# Patient Record
Sex: Male | Born: 1940
Health system: Southern US, Community
[De-identification: ages and names within clinical notes are randomized; demographics above are authoritative.]

## PROBLEM LIST (undated history)

## (undated) DIAGNOSIS — R609 Edema, unspecified: Secondary | ICD-10-CM

## (undated) DIAGNOSIS — M2550 Pain in unspecified joint: Secondary | ICD-10-CM

## (undated) DIAGNOSIS — R51 Headache: Secondary | ICD-10-CM

## (undated) DIAGNOSIS — M2540 Effusion, unspecified joint: Secondary | ICD-10-CM

## (undated) DIAGNOSIS — R600 Localized edema: Secondary | ICD-10-CM

## (undated) DIAGNOSIS — B029 Zoster without complications: Secondary | ICD-10-CM

## (undated) DIAGNOSIS — F32A Depression, unspecified: Secondary | ICD-10-CM

## (undated) DIAGNOSIS — N184 Chronic kidney disease, stage 4 (severe): Secondary | ICD-10-CM

## (undated) DIAGNOSIS — K5732 Diverticulitis of large intestine without perforation or abscess without bleeding: Secondary | ICD-10-CM

## (undated) DIAGNOSIS — Z860100 Personal history of colon polyps, unspecified: Secondary | ICD-10-CM

## (undated) DIAGNOSIS — N401 Enlarged prostate with lower urinary tract symptoms: Secondary | ICD-10-CM

## (undated) DIAGNOSIS — N3941 Urge incontinence: Secondary | ICD-10-CM

## (undated) DIAGNOSIS — G4730 Sleep apnea, unspecified: Secondary | ICD-10-CM

## (undated) DIAGNOSIS — I5032 Chronic diastolic (congestive) heart failure: Secondary | ICD-10-CM

## (undated) DIAGNOSIS — I10 Essential (primary) hypertension: Secondary | ICD-10-CM

## (undated) DIAGNOSIS — S72009A Fracture of unspecified part of neck of unspecified femur, initial encounter for closed fracture: Secondary | ICD-10-CM

## (undated) DIAGNOSIS — N189 Chronic kidney disease, unspecified: Secondary | ICD-10-CM

## (undated) DIAGNOSIS — M1990 Unspecified osteoarthritis, unspecified site: Secondary | ICD-10-CM

## (undated) DIAGNOSIS — M810 Age-related osteoporosis without current pathological fracture: Secondary | ICD-10-CM

## (undated) DIAGNOSIS — M549 Dorsalgia, unspecified: Secondary | ICD-10-CM

## (undated) DIAGNOSIS — K645 Perianal venous thrombosis: Secondary | ICD-10-CM

## (undated) DIAGNOSIS — E039 Hypothyroidism, unspecified: Secondary | ICD-10-CM

## (undated) DIAGNOSIS — F319 Bipolar disorder, unspecified: Secondary | ICD-10-CM

## (undated) DIAGNOSIS — F329 Major depressive disorder, single episode, unspecified: Secondary | ICD-10-CM

## (undated) HISTORY — DX: Zoster without complications: B02.9

## (undated) HISTORY — DX: Diverticulitis of large intestine without perforation or abscess without bleeding: K57.32

## (undated) HISTORY — DX: Perianal venous thrombosis: K64.5

## (undated) HISTORY — DX: Benign prostatic hyperplasia with lower urinary tract symptoms: N39.41

## (undated) HISTORY — PX: BACK SURGERY: SHX140

## (undated) HISTORY — PX: TONSILLECTOMY: SUR1361

## (undated) HISTORY — DX: Fracture of unspecified part of neck of unspecified femur, initial encounter for closed fracture: S72.009A

## (undated) HISTORY — PX: CARDIAC CATHETERIZATION: SHX172

## (undated) HISTORY — DX: Bipolar disorder, unspecified: F31.9

## (undated) HISTORY — DX: Chronic diastolic (congestive) heart failure: I50.32

## (undated) HISTORY — DX: Hypothyroidism, unspecified: E03.9

## (undated) HISTORY — DX: Benign prostatic hyperplasia with lower urinary tract symptoms: N40.1

## (undated) HISTORY — DX: Chronic kidney disease, stage 4 (severe): N18.4

## (undated) HISTORY — DX: Unspecified osteoarthritis, unspecified site: M19.90

## (undated) HISTORY — DX: Essential (primary) hypertension: I10

---

## 2000-11-15 ENCOUNTER — Other Ambulatory Visit (HOSPITAL_COMMUNITY): Admission: RE | Admit: 2000-11-15 | Discharge: 2000-11-22 | Payer: Self-pay | Admitting: Psychiatry

## 2001-03-27 ENCOUNTER — Inpatient Hospital Stay (HOSPITAL_COMMUNITY): Admission: EM | Admit: 2001-03-27 | Discharge: 2001-03-30 | Payer: Self-pay | Admitting: Psychiatry

## 2004-10-14 ENCOUNTER — Ambulatory Visit: Payer: Self-pay | Admitting: Internal Medicine

## 2004-11-04 ENCOUNTER — Ambulatory Visit: Payer: Self-pay | Admitting: Internal Medicine

## 2004-11-11 ENCOUNTER — Ambulatory Visit: Payer: Self-pay | Admitting: Internal Medicine

## 2005-05-13 ENCOUNTER — Ambulatory Visit: Payer: Self-pay | Admitting: Internal Medicine

## 2006-03-02 ENCOUNTER — Ambulatory Visit: Payer: Self-pay | Admitting: Internal Medicine

## 2006-03-23 ENCOUNTER — Ambulatory Visit: Payer: Self-pay | Admitting: Internal Medicine

## 2006-03-23 LAB — CONVERTED CEMR LAB
ALT: 16 units/L (ref 0–40)
AST: 20 units/L (ref 0–37)
Albumin: 4 g/dL (ref 3.5–5.2)
Alkaline Phosphatase: 97 units/L (ref 39–117)
BUN: 21 mg/dL (ref 6–23)
Basophils Absolute: 0 10*3/uL (ref 0.0–0.1)
Basophils Relative: 0.1 % (ref 0.0–1.0)
CO2: 29 meq/L (ref 19–32)
Calcium: 9.7 mg/dL (ref 8.4–10.5)
Chloride: 111 meq/L (ref 96–112)
Chol/HDL Ratio, serum: 2.7
Cholesterol: 135 mg/dL (ref 0–200)
Creatinine, Ser: 2.1 mg/dL — ABNORMAL HIGH (ref 0.4–1.5)
Eosinophil percent: 5.2 % — ABNORMAL HIGH (ref 0.0–5.0)
GFR calc non Af Amer: 34 mL/min
Glomerular Filtration Rate, Af Am: 41 mL/min/{1.73_m2}
Glucose, Bld: 102 mg/dL — ABNORMAL HIGH (ref 70–99)
HCT: 39.5 % (ref 39.0–52.0)
HDL: 50.6 mg/dL (ref >39.0)
Hemoglobin: 12.8 g/dL — ABNORMAL LOW (ref 13.0–17.0)
LDL Cholesterol: 73 mg/dL (ref 0–99)
Lymphocytes Relative: 17.9 % (ref 12.0–46.0)
MCHC: 32.4 g/dL (ref 30.0–36.0)
MCV: 91.6 fL (ref 78.0–100.0)
Monocytes Absolute: 0.7 10*3/uL (ref 0.2–0.7)
Monocytes Relative: 6.9 % (ref 3.0–11.0)
Neutro Abs: 6.7 10*3/uL (ref 1.4–7.7)
Neutrophils Relative %: 69.9 % (ref 43.0–77.0)
PSA: 0.94 ng/mL (ref 0.10–4.00)
Platelets: 268 10*3/uL (ref 150–400)
Potassium: 4.3 meq/L (ref 3.5–5.1)
RBC: 4.32 M/uL (ref 4.22–5.81)
RDW: 11.9 % (ref 11.5–14.6)
Sodium: 145 meq/L (ref 135–145)
TSH: 5.1 microintl units/mL (ref 0.35–5.50)
Total Bilirubin: 0.7 mg/dL (ref 0.3–1.2)
Total Protein: 6.7 g/dL (ref 6.0–8.3)
Triglyceride fasting, serum: 58 mg/dL (ref 0–149)
VLDL: 12 mg/dL (ref 0–40)
WBC: 9.6 10*3/uL (ref 4.5–10.5)

## 2006-04-14 ENCOUNTER — Ambulatory Visit: Payer: Self-pay | Admitting: Internal Medicine

## 2006-08-29 ENCOUNTER — Ambulatory Visit: Payer: Self-pay | Admitting: Internal Medicine

## 2006-08-29 LAB — CONVERTED CEMR LAB
Free T4: 0.8 ng/dL (ref 0.6–1.6)
T3 Uptake Ratio: 43.5 % — ABNORMAL HIGH (ref 22.5–37.0)
T4, Total: 7.8 ug/dL (ref 5.0–12.5)
TSH: 3.97 microintl units/mL (ref 0.35–5.50)

## 2006-09-21 ENCOUNTER — Ambulatory Visit: Payer: Self-pay | Admitting: Internal Medicine

## 2006-09-21 DIAGNOSIS — K589 Irritable bowel syndrome without diarrhea: Secondary | ICD-10-CM | POA: Insufficient documentation

## 2006-09-21 DIAGNOSIS — K5730 Diverticulosis of large intestine without perforation or abscess without bleeding: Secondary | ICD-10-CM | POA: Insufficient documentation

## 2006-09-21 DIAGNOSIS — F3131 Bipolar disorder, current episode depressed, mild: Secondary | ICD-10-CM | POA: Insufficient documentation

## 2007-01-06 ENCOUNTER — Ambulatory Visit: Payer: Self-pay | Admitting: Internal Medicine

## 2007-01-06 LAB — CONVERTED CEMR LAB
Basophils Absolute: 0.1 10*3/uL (ref 0.0–0.1)
Basophils Relative: 1.1 % — ABNORMAL HIGH (ref 0.0–1.0)
Eosinophils Absolute: 0.4 10*3/uL (ref 0.0–0.6)
Eosinophils Relative: 5 % (ref 0.0–5.0)
Free T4: 0.8 ng/dL (ref 0.6–1.6)
HCT: 35.9 % — ABNORMAL LOW (ref 39.0–52.0)
Hemoglobin: 12.4 g/dL — ABNORMAL LOW (ref 13.0–17.0)
Lymphocytes Relative: 18.4 % (ref 12.0–46.0)
MCHC: 34.5 g/dL (ref 30.0–36.0)
MCV: 93.3 fL (ref 78.0–100.0)
Monocytes Absolute: 0.8 10*3/uL — ABNORMAL HIGH (ref 0.2–0.7)
Monocytes Relative: 8.7 % (ref 3.0–11.0)
Neutro Abs: 5.8 10*3/uL (ref 1.4–7.7)
Neutrophils Relative %: 66.8 % (ref 43.0–77.0)
Platelets: 229 10*3/uL (ref 150–400)
RBC: 3.85 M/uL — ABNORMAL LOW (ref 4.22–5.81)
RDW: 12.2 % (ref 11.5–14.6)
T3 Uptake Ratio: 41.7 % — ABNORMAL HIGH (ref 22.5–37.0)
TSH: 6.09 microintl units/mL — ABNORMAL HIGH (ref 0.35–5.50)
WBC: 8.7 10*3/uL (ref 4.5–10.5)

## 2007-02-13 ENCOUNTER — Telehealth: Payer: Self-pay | Admitting: Internal Medicine

## 2007-04-11 ENCOUNTER — Ambulatory Visit: Payer: Self-pay | Admitting: Internal Medicine

## 2007-04-11 DIAGNOSIS — D500 Iron deficiency anemia secondary to blood loss (chronic): Secondary | ICD-10-CM | POA: Insufficient documentation

## 2007-04-11 DIAGNOSIS — I10 Essential (primary) hypertension: Secondary | ICD-10-CM

## 2007-04-11 HISTORY — DX: Essential (primary) hypertension: I10

## 2007-04-11 LAB — CONVERTED CEMR LAB
BUN: 19 mg/dL (ref 6–23)
Basophils Absolute: 0 10*3/uL (ref 0.0–0.1)
Basophils Relative: 0.3 % (ref 0.0–1.0)
CO2: 29 meq/L (ref 19–32)
Calcium: 9.5 mg/dL (ref 8.4–10.5)
Chloride: 106 meq/L (ref 96–112)
Creatinine, Ser: 2.1 mg/dL — ABNORMAL HIGH (ref 0.4–1.5)
Eosinophils Absolute: 0.4 10*3/uL (ref 0.0–0.6)
Eosinophils Relative: 3.7 % (ref 0.0–5.0)
GFR calc Af Amer: 41 mL/min
GFR calc non Af Amer: 34 mL/min
Glucose, Bld: 117 mg/dL — ABNORMAL HIGH (ref 70–99)
HCT: 36.6 % — ABNORMAL LOW (ref 39.0–52.0)
Hemoglobin: 13 g/dL (ref 13.0–17.0)
Lymphocytes Relative: 21 % (ref 12.0–46.0)
MCHC: 35.5 g/dL (ref 30.0–36.0)
MCV: 92.8 fL (ref 78.0–100.0)
Monocytes Absolute: 0.8 10*3/uL — ABNORMAL HIGH (ref 0.2–0.7)
Monocytes Relative: 8.2 % (ref 3.0–11.0)
Neutro Abs: 6.6 10*3/uL (ref 1.4–7.7)
Neutrophils Relative %: 66.8 % (ref 43.0–77.0)
Platelets: 268 10*3/uL (ref 150–400)
Potassium: 4.7 meq/L (ref 3.5–5.1)
RBC: 3.94 M/uL — ABNORMAL LOW (ref 4.22–5.81)
RDW: 12.5 % (ref 11.5–14.6)
Sodium: 141 meq/L (ref 135–145)
TSH: 4.09 microintl units/mL (ref 0.35–5.50)
WBC: 9.9 10*3/uL (ref 4.5–10.5)

## 2007-04-12 ENCOUNTER — Telehealth: Payer: Self-pay | Admitting: Internal Medicine

## 2007-05-15 ENCOUNTER — Ambulatory Visit: Payer: Self-pay | Admitting: Internal Medicine

## 2007-05-15 LAB — CONVERTED CEMR LAB
BUN: 26 mg/dL — ABNORMAL HIGH (ref 6–23)
Basophils Absolute: 0.1 10*3/uL (ref 0.0–0.1)
Basophils Relative: 1.3 % — ABNORMAL HIGH (ref 0.0–1.0)
CO2: 26 meq/L (ref 19–32)
Calcium: 9.7 mg/dL (ref 8.4–10.5)
Chloride: 108 meq/L (ref 96–112)
Creatinine, Ser: 2.3 mg/dL — ABNORMAL HIGH (ref 0.4–1.5)
Eosinophils Absolute: 0.5 10*3/uL (ref 0.0–0.6)
Eosinophils Relative: 4.7 % (ref 0.0–5.0)
GFR calc Af Amer: 37 mL/min
GFR calc non Af Amer: 30 mL/min
Glucose, Bld: 98 mg/dL (ref 70–99)
HCT: 39.3 % (ref 39.0–52.0)
Hemoglobin: 12.9 g/dL — ABNORMAL LOW (ref 13.0–17.0)
Lithium Lvl: 1.93 meq/L — ABNORMAL HIGH (ref 0.80–1.40)
Lymphocytes Relative: 14.9 % (ref 12.0–46.0)
MCHC: 32.8 g/dL (ref 30.0–36.0)
MCV: 94.8 fL (ref 78.0–100.0)
Monocytes Absolute: 0.8 10*3/uL — ABNORMAL HIGH (ref 0.2–0.7)
Monocytes Relative: 7.9 % (ref 3.0–11.0)
Neutro Abs: 7.4 10*3/uL (ref 1.4–7.7)
Neutrophils Relative %: 71.2 % (ref 43.0–77.0)
Platelets: 251 10*3/uL (ref 150–400)
Potassium: 5.9 meq/L — ABNORMAL HIGH (ref 3.5–5.1)
RBC: 4.14 M/uL — ABNORMAL LOW (ref 4.22–5.81)
RDW: 12.3 % (ref 11.5–14.6)
Sodium: 138 meq/L (ref 135–145)
WBC: 10.3 10*3/uL (ref 4.5–10.5)

## 2007-05-19 ENCOUNTER — Ambulatory Visit: Payer: Self-pay | Admitting: Internal Medicine

## 2007-05-23 LAB — CONVERTED CEMR LAB: Lithium Lvl: 1.71 meq/L — ABNORMAL HIGH (ref 0.80–1.40)

## 2007-06-01 ENCOUNTER — Telehealth: Payer: Self-pay | Admitting: Internal Medicine

## 2007-06-07 ENCOUNTER — Encounter: Payer: Self-pay | Admitting: Internal Medicine

## 2007-06-07 ENCOUNTER — Telehealth: Payer: Self-pay | Admitting: Internal Medicine

## 2007-06-07 LAB — CONVERTED CEMR LAB: Lithium Lvl: <0.25 meq/L — ABNORMAL LOW (ref 0.80–1.40)

## 2007-06-08 ENCOUNTER — Ambulatory Visit: Payer: Self-pay | Admitting: Internal Medicine

## 2007-06-08 LAB — CONVERTED CEMR LAB
BUN: 29 mg/dL — ABNORMAL HIGH (ref 6–23)
CO2: 25 meq/L (ref 19–32)
Calcium: 8.8 mg/dL (ref 8.4–10.5)
Chloride: 108 meq/L (ref 96–112)
Creatinine, Ser: 2.5 mg/dL — ABNORMAL HIGH (ref 0.4–1.5)
GFR calc Af Amer: 33 mL/min
GFR calc non Af Amer: 28 mL/min
Glucose, Bld: 86 mg/dL (ref 70–99)
Potassium: 4.7 meq/L (ref 3.5–5.1)
Sodium: 137 meq/L (ref 135–145)

## 2007-06-09 ENCOUNTER — Ambulatory Visit: Payer: Self-pay | Admitting: Internal Medicine

## 2007-06-15 ENCOUNTER — Ambulatory Visit: Payer: Self-pay | Admitting: Internal Medicine

## 2007-06-15 LAB — CONVERTED CEMR LAB
BUN: 29 mg/dL — ABNORMAL HIGH (ref 6–23)
CO2: 28 meq/L (ref 19–32)
Calcium: 8.8 mg/dL (ref 8.4–10.5)
Chloride: 105 meq/L (ref 96–112)
Creatinine, Ser: 2.3 mg/dL — ABNORMAL HIGH (ref 0.4–1.5)
GFR calc Af Amer: 37 mL/min
GFR calc non Af Amer: 30 mL/min
Glucose, Bld: 87 mg/dL (ref 70–99)
Potassium: 5.1 meq/L (ref 3.5–5.1)
Sodium: 138 meq/L (ref 135–145)

## 2007-06-16 ENCOUNTER — Ambulatory Visit: Payer: Self-pay | Admitting: Internal Medicine

## 2007-06-16 LAB — CONVERTED CEMR LAB
BUN: 28 mg/dL — ABNORMAL HIGH (ref 6–23)
CO2: 23 meq/L (ref 19–32)
Calcium: 8.8 mg/dL (ref 8.4–10.5)
Chloride: 108 meq/L (ref 96–112)
Creatinine, Ser: 2.14 mg/dL — ABNORMAL HIGH (ref 0.40–1.50)
Glucose, Bld: 89 mg/dL (ref 70–99)
Potassium: 5.3 meq/L (ref 3.5–5.3)
Sodium: 144 meq/L (ref 135–145)

## 2007-06-23 ENCOUNTER — Telehealth: Payer: Self-pay | Admitting: Internal Medicine

## 2007-07-17 ENCOUNTER — Ambulatory Visit: Payer: Self-pay | Admitting: Internal Medicine

## 2007-07-17 LAB — CONVERTED CEMR LAB
BUN: 29 mg/dL — ABNORMAL HIGH (ref 6–23)
CO2: 31 meq/L (ref 19–32)
Calcium: 9.2 mg/dL (ref 8.4–10.5)
Chloride: 109 meq/L (ref 96–112)
Creatinine, Ser: 2 mg/dL — ABNORMAL HIGH (ref 0.4–1.5)
GFR calc Af Amer: 43 mL/min
GFR calc non Af Amer: 36 mL/min
Glucose, Bld: 75 mg/dL (ref 70–99)
Potassium: 5.5 meq/L — ABNORMAL HIGH (ref 3.5–5.1)
Sodium: 142 meq/L (ref 135–145)

## 2007-09-18 ENCOUNTER — Ambulatory Visit: Payer: Self-pay | Admitting: Internal Medicine

## 2007-09-18 LAB — CONVERTED CEMR LAB
BUN: 24 mg/dL — ABNORMAL HIGH (ref 6–23)
CO2: 29 meq/L (ref 19–32)
Calcium: 9.2 mg/dL (ref 8.4–10.5)
Chloride: 111 meq/L (ref 96–112)
Creatinine, Ser: 2.2 mg/dL — ABNORMAL HIGH (ref 0.4–1.5)
GFR calc Af Amer: 39 mL/min
GFR calc non Af Amer: 32 mL/min
Glucose, Bld: 62 mg/dL — ABNORMAL LOW (ref 70–99)
Potassium: 4.5 meq/L (ref 3.5–5.1)
Sodium: 144 meq/L (ref 135–145)

## 2007-12-18 ENCOUNTER — Ambulatory Visit: Payer: Self-pay | Admitting: Internal Medicine

## 2008-01-01 LAB — CONVERTED CEMR LAB
BUN: 43 mg/dL — ABNORMAL HIGH (ref 6–23)
Basophils Absolute: 0.1 10*3/uL (ref 0.0–0.1)
Basophils Relative: 1.2 % (ref 0.0–3.0)
CO2: 30 meq/L (ref 19–32)
Calcium: 9.1 mg/dL (ref 8.4–10.5)
Chloride: 107 meq/L (ref 96–112)
Creatinine, Ser: 2.4 mg/dL — ABNORMAL HIGH (ref 0.4–1.5)
Eosinophils Absolute: 0.3 10*3/uL (ref 0.0–0.7)
Eosinophils Relative: 4.3 % (ref 0.0–5.0)
GFR calc Af Amer: 35 mL/min
GFR calc non Af Amer: 29 mL/min
Glucose, Bld: 145 mg/dL — ABNORMAL HIGH (ref 70–99)
HCT: 35.4 % — ABNORMAL LOW (ref 39.0–52.0)
Hemoglobin: 12.6 g/dL — ABNORMAL LOW (ref 13.0–17.0)
Lymphocytes Relative: 25.8 % (ref 12.0–46.0)
MCHC: 35.4 g/dL (ref 30.0–36.0)
MCV: 91.6 fL (ref 78.0–100.0)
Monocytes Absolute: 0.7 10*3/uL (ref 0.1–1.0)
Monocytes Relative: 10.2 % (ref 3.0–12.0)
Neutro Abs: 4.1 10*3/uL (ref 1.4–7.7)
Neutrophils Relative %: 58.5 % (ref 43.0–77.0)
Platelets: 214 10*3/uL (ref 150–400)
Potassium: 4.2 meq/L (ref 3.5–5.1)
RBC: 3.87 M/uL — ABNORMAL LOW (ref 4.22–5.81)
RDW: 12 % (ref 11.5–14.6)
Sodium: 146 meq/L — ABNORMAL HIGH (ref 135–145)
WBC: 7 10*3/uL (ref 4.5–10.5)

## 2008-01-10 ENCOUNTER — Encounter: Payer: Self-pay | Admitting: Internal Medicine

## 2008-01-15 ENCOUNTER — Ambulatory Visit: Payer: Self-pay | Admitting: Internal Medicine

## 2008-01-15 LAB — CONVERTED CEMR LAB
BUN: 27 mg/dL — ABNORMAL HIGH (ref 6–23)
CO2: 29 meq/L (ref 19–32)
Calcium: 9 mg/dL (ref 8.4–10.5)
Chloride: 112 meq/L (ref 96–112)
Creatinine, Ser: 2 mg/dL — ABNORMAL HIGH (ref 0.4–1.5)
GFR calc Af Amer: 43 mL/min
GFR calc non Af Amer: 36 mL/min
Glucose, Bld: 78 mg/dL (ref 70–99)
Potassium: 5.1 meq/L (ref 3.5–5.1)
Sodium: 145 meq/L (ref 135–145)
TSH: 1.21 microintl units/mL (ref 0.35–5.50)

## 2008-03-01 ENCOUNTER — Ambulatory Visit: Payer: Self-pay | Admitting: Internal Medicine

## 2008-03-01 DIAGNOSIS — N184 Chronic kidney disease, stage 4 (severe): Secondary | ICD-10-CM | POA: Insufficient documentation

## 2008-03-01 LAB — CONVERTED CEMR LAB
BUN: 37 mg/dL — ABNORMAL HIGH (ref 6–23)
CO2: 27 meq/L (ref 19–32)
Calcium: 9.1 mg/dL (ref 8.4–10.5)
Chloride: 108 meq/L (ref 96–112)
Creatinine, Ser: 2.1 mg/dL — ABNORMAL HIGH (ref 0.4–1.5)
GFR calc Af Amer: 41 mL/min
GFR calc non Af Amer: 34 mL/min
Glucose, Bld: 90 mg/dL (ref 70–99)
Potassium: 4.9 meq/L (ref 3.5–5.1)
Sodium: 141 meq/L (ref 135–145)

## 2008-04-29 ENCOUNTER — Telehealth: Payer: Self-pay | Admitting: *Deleted

## 2008-07-05 ENCOUNTER — Ambulatory Visit: Payer: Self-pay | Admitting: Internal Medicine

## 2008-07-05 DIAGNOSIS — K645 Perianal venous thrombosis: Secondary | ICD-10-CM | POA: Insufficient documentation

## 2008-07-05 HISTORY — DX: Perianal venous thrombosis: K64.5

## 2008-07-05 LAB — CONVERTED CEMR LAB
ALT: 8 units/L (ref 0–53)
AST: 14 units/L (ref 0–37)
Albumin: 3.8 g/dL (ref 3.5–5.2)
Alkaline Phosphatase: 84 units/L (ref 39–117)
BUN: 34 mg/dL — ABNORMAL HIGH (ref 6–23)
Bilirubin, Direct: 0.1 mg/dL (ref 0.0–0.3)
CO2: 22 meq/L (ref 19–32)
Calcium: 8.9 mg/dL (ref 8.4–10.5)
Chloride: 109 meq/L (ref 96–112)
Creatinine, Ser: 2.22 mg/dL — ABNORMAL HIGH (ref 0.40–1.50)
Glucose, Bld: 95 mg/dL (ref 70–99)
Indirect Bilirubin: 0.3 mg/dL (ref 0.0–0.9)
Potassium: 5.3 meq/L (ref 3.5–5.3)
Sodium: 142 meq/L (ref 135–145)
Total Bilirubin: 0.4 mg/dL (ref 0.3–1.2)
Total Protein: 6.2 g/dL (ref 6.0–8.3)

## 2008-07-07 ENCOUNTER — Telehealth: Payer: Self-pay | Admitting: Family Medicine

## 2008-07-08 ENCOUNTER — Telehealth: Payer: Self-pay | Admitting: Internal Medicine

## 2008-07-08 DIAGNOSIS — R109 Unspecified abdominal pain: Secondary | ICD-10-CM | POA: Insufficient documentation

## 2008-07-10 ENCOUNTER — Encounter: Payer: Self-pay | Admitting: Internal Medicine

## 2008-07-10 ENCOUNTER — Encounter: Admission: RE | Admit: 2008-07-10 | Discharge: 2008-07-10 | Payer: Self-pay | Admitting: Sports Medicine

## 2008-08-01 ENCOUNTER — Encounter: Payer: Self-pay | Admitting: Internal Medicine

## 2008-08-27 ENCOUNTER — Encounter: Payer: Self-pay | Admitting: Internal Medicine

## 2008-09-24 ENCOUNTER — Encounter: Admission: RE | Admit: 2008-09-24 | Discharge: 2008-09-24 | Payer: Self-pay | Admitting: Sports Medicine

## 2008-09-24 ENCOUNTER — Encounter: Payer: Self-pay | Admitting: Internal Medicine

## 2008-09-25 ENCOUNTER — Ambulatory Visit: Payer: Self-pay | Admitting: Internal Medicine

## 2008-09-25 DIAGNOSIS — M810 Age-related osteoporosis without current pathological fracture: Secondary | ICD-10-CM | POA: Insufficient documentation

## 2008-10-30 ENCOUNTER — Ambulatory Visit: Payer: Self-pay | Admitting: Internal Medicine

## 2008-10-30 DIAGNOSIS — G4733 Obstructive sleep apnea (adult) (pediatric): Secondary | ICD-10-CM | POA: Insufficient documentation

## 2008-10-30 DIAGNOSIS — I87329 Chronic venous hypertension (idiopathic) with inflammation of unspecified lower extremity: Secondary | ICD-10-CM | POA: Insufficient documentation

## 2008-11-29 ENCOUNTER — Encounter: Payer: Self-pay | Admitting: Internal Medicine

## 2008-11-29 ENCOUNTER — Ambulatory Visit (HOSPITAL_BASED_OUTPATIENT_CLINIC_OR_DEPARTMENT_OTHER): Admission: RE | Admit: 2008-11-29 | Discharge: 2008-11-29 | Payer: Self-pay | Admitting: Internal Medicine

## 2008-12-15 ENCOUNTER — Ambulatory Visit: Payer: Self-pay | Admitting: Pulmonary Disease

## 2008-12-18 ENCOUNTER — Ambulatory Visit: Payer: Self-pay | Admitting: Internal Medicine

## 2008-12-29 ENCOUNTER — Emergency Department (HOSPITAL_COMMUNITY): Admission: EM | Admit: 2008-12-29 | Discharge: 2008-12-29 | Payer: Self-pay | Admitting: Emergency Medicine

## 2009-01-02 ENCOUNTER — Ambulatory Visit: Payer: Self-pay | Admitting: Family Medicine

## 2009-01-08 ENCOUNTER — Telehealth: Payer: Self-pay | Admitting: *Deleted

## 2009-01-09 ENCOUNTER — Encounter: Payer: Self-pay | Admitting: Internal Medicine

## 2009-01-22 ENCOUNTER — Encounter: Payer: Self-pay | Admitting: Internal Medicine

## 2009-02-03 ENCOUNTER — Encounter: Payer: Self-pay | Admitting: Internal Medicine

## 2009-02-11 ENCOUNTER — Ambulatory Visit: Payer: Self-pay | Admitting: Internal Medicine

## 2009-02-18 ENCOUNTER — Encounter (INDEPENDENT_AMBULATORY_CARE_PROVIDER_SITE_OTHER): Payer: Self-pay | Admitting: *Deleted

## 2009-02-19 LAB — CONVERTED CEMR LAB
BUN: 35 mg/dL — ABNORMAL HIGH (ref 6–23)
Basophils Absolute: 0 10*3/uL (ref 0.0–0.1)
Basophils Relative: 0.1 % (ref 0.0–3.0)
CO2: 29 meq/L (ref 19–32)
Calcium: 9.5 mg/dL (ref 8.4–10.5)
Chloride: 107 meq/L (ref 96–112)
Creatinine, Ser: 2.2 mg/dL — ABNORMAL HIGH (ref 0.4–1.5)
Eosinophils Absolute: 0.4 10*3/uL (ref 0.0–0.7)
Eosinophils Relative: 4.2 % (ref 0.0–5.0)
GFR calc non Af Amer: 31.7 mL/min (ref >60)
Glucose, Bld: 81 mg/dL (ref 70–99)
HCT: 36.4 % — ABNORMAL LOW (ref 39.0–52.0)
Hemoglobin: 12.7 g/dL — ABNORMAL LOW (ref 13.0–17.0)
Iron: 53 ug/dL (ref 42–165)
Lymphocytes Relative: 17.4 % (ref 12.0–46.0)
Lymphs Abs: 1.5 10*3/uL (ref 0.7–4.0)
MCHC: 34.9 g/dL (ref 30.0–36.0)
MCV: 94.4 fL (ref 78.0–100.0)
Monocytes Absolute: 0.7 10*3/uL (ref 0.1–1.0)
Monocytes Relative: 8.7 % (ref 3.0–12.0)
Neutro Abs: 5.8 10*3/uL (ref 1.4–7.7)
Neutrophils Relative %: 69.6 % (ref 43.0–77.0)
Platelets: 211 10*3/uL (ref 150.0–400.0)
Potassium: 5.2 meq/L — ABNORMAL HIGH (ref 3.5–5.1)
RBC: 3.86 M/uL — ABNORMAL LOW (ref 4.22–5.81)
RDW: 12 % (ref 11.5–14.6)
Saturation Ratios: 22 % (ref 20.0–50.0)
Sodium: 142 meq/L (ref 135–145)
Transferrin: 172.3 mg/dL — ABNORMAL LOW (ref 212.0–360.0)
WBC: 8.4 10*3/uL (ref 4.5–10.5)

## 2009-03-03 ENCOUNTER — Ambulatory Visit: Payer: Self-pay | Admitting: Internal Medicine

## 2009-03-03 LAB — CONVERTED CEMR LAB
BUN: 39 mg/dL — ABNORMAL HIGH (ref 6–23)
CO2: 29 meq/L (ref 19–32)
Calcium: 8.9 mg/dL (ref 8.4–10.5)
Chloride: 107 meq/L (ref 96–112)
Creatinine, Ser: 2.4 mg/dL — ABNORMAL HIGH (ref 0.4–1.5)
GFR calc non Af Amer: 28.67 mL/min (ref >60)
Glucose, Bld: 99 mg/dL (ref 70–99)
Potassium: 4.6 meq/L (ref 3.5–5.1)
Sodium: 143 meq/L (ref 135–145)

## 2009-06-30 ENCOUNTER — Telehealth: Payer: Self-pay | Admitting: Internal Medicine

## 2009-07-02 ENCOUNTER — Ambulatory Visit: Payer: Self-pay | Admitting: Internal Medicine

## 2009-07-02 LAB — CONVERTED CEMR LAB
BUN: 39 mg/dL — ABNORMAL HIGH (ref 6–23)
CO2: 29 meq/L (ref 19–32)
Calcium: 8.9 mg/dL (ref 8.4–10.5)
Chloride: 109 meq/L (ref 96–112)
Creatinine, Ser: 2 mg/dL — ABNORMAL HIGH (ref 0.4–1.5)
GFR calc non Af Amer: 35.35 mL/min (ref >60)
Glucose, Bld: 68 mg/dL — ABNORMAL LOW (ref 70–99)
Potassium: 4.5 meq/L (ref 3.5–5.1)
Sodium: 145 meq/L (ref 135–145)

## 2009-08-28 ENCOUNTER — Telehealth: Payer: Self-pay | Admitting: Internal Medicine

## 2009-09-14 ENCOUNTER — Encounter: Payer: Self-pay | Admitting: Internal Medicine

## 2009-09-30 ENCOUNTER — Encounter: Payer: Self-pay | Admitting: Internal Medicine

## 2009-10-03 ENCOUNTER — Ambulatory Visit: Payer: Self-pay | Admitting: Internal Medicine

## 2009-10-03 DIAGNOSIS — E039 Hypothyroidism, unspecified: Secondary | ICD-10-CM | POA: Insufficient documentation

## 2009-10-03 HISTORY — DX: Hypothyroidism, unspecified: E03.9

## 2009-10-03 LAB — CONVERTED CEMR LAB
ALT: 14 units/L (ref 0–53)
AST: 17 units/L (ref 0–37)
Albumin: 3.9 g/dL (ref 3.5–5.2)
Alkaline Phosphatase: 65 units/L (ref 39–117)
BUN: 41 mg/dL — ABNORMAL HIGH (ref 6–23)
Basophils Absolute: 0.1 10*3/uL (ref 0.0–0.1)
Basophils Relative: 1 % (ref 0.0–3.0)
Bilirubin Urine: NEGATIVE
Bilirubin, Direct: 0.2 mg/dL (ref 0.0–0.3)
Blood in Urine, dipstick: NEGATIVE
CO2: 29 meq/L (ref 19–32)
Calcium: 9.2 mg/dL (ref 8.4–10.5)
Chloride: 108 meq/L (ref 96–112)
Cholesterol: 138 mg/dL (ref 0–200)
Creatinine, Ser: 2.2 mg/dL — ABNORMAL HIGH (ref 0.4–1.5)
Eosinophils Absolute: 0.3 10*3/uL (ref 0.0–0.7)
Eosinophils Relative: 3.6 % (ref 0.0–5.0)
GFR calc non Af Amer: 31.64 mL/min (ref >60)
Glucose, Bld: 85 mg/dL (ref 70–99)
Glucose, Urine, Semiquant: NEGATIVE
HCT: 37 % — ABNORMAL LOW (ref 39.0–52.0)
HDL: 51.3 mg/dL (ref >39.00)
Hemoglobin: 12.7 g/dL — ABNORMAL LOW (ref 13.0–17.0)
Ketones, urine, test strip: NEGATIVE
LDL Cholesterol: 68 mg/dL (ref 0–99)
Lymphocytes Relative: 21.7 % (ref 12.0–46.0)
Lymphs Abs: 1.6 10*3/uL (ref 0.7–4.0)
MCHC: 34.3 g/dL (ref 30.0–36.0)
MCV: 95.1 fL (ref 78.0–100.0)
Monocytes Absolute: 0.6 10*3/uL (ref 0.1–1.0)
Monocytes Relative: 8.2 % (ref 3.0–12.0)
Neutro Abs: 4.8 10*3/uL (ref 1.4–7.7)
Neutrophils Relative %: 65.5 % (ref 43.0–77.0)
Nitrite: NEGATIVE
PSA: 0.87 ng/mL (ref 0.10–4.00)
Platelets: 201 10*3/uL (ref 150.0–400.0)
Potassium: 5.2 meq/L — ABNORMAL HIGH (ref 3.5–5.1)
Protein, U semiquant: NEGATIVE
RBC: 3.89 M/uL — ABNORMAL LOW (ref 4.22–5.81)
RDW: 13.4 % (ref 11.5–14.6)
Sodium: 141 meq/L (ref 135–145)
Specific Gravity, Urine: 1.015
TSH: 1.46 microintl units/mL (ref 0.35–5.50)
Total Bilirubin: 0.7 mg/dL (ref 0.3–1.2)
Total CHOL/HDL Ratio: 3
Total Protein: 6.4 g/dL (ref 6.0–8.3)
Triglycerides: 93 mg/dL (ref 0.0–149.0)
Urobilinogen, UA: 0.2
VLDL: 18.6 mg/dL (ref 0.0–40.0)
WBC Urine, dipstick: NEGATIVE
WBC: 7.4 10*3/uL (ref 4.5–10.5)
pH: 6

## 2009-10-07 ENCOUNTER — Ambulatory Visit: Payer: Self-pay | Admitting: Internal Medicine

## 2009-10-07 LAB — CONVERTED CEMR LAB

## 2009-10-17 ENCOUNTER — Encounter (INDEPENDENT_AMBULATORY_CARE_PROVIDER_SITE_OTHER): Payer: Self-pay | Admitting: *Deleted

## 2009-12-04 ENCOUNTER — Encounter (INDEPENDENT_AMBULATORY_CARE_PROVIDER_SITE_OTHER): Payer: Self-pay | Admitting: *Deleted

## 2009-12-08 ENCOUNTER — Ambulatory Visit: Payer: Self-pay | Admitting: Gastroenterology

## 2009-12-29 ENCOUNTER — Ambulatory Visit: Payer: Self-pay | Admitting: Gastroenterology

## 2009-12-31 ENCOUNTER — Encounter: Payer: Self-pay | Admitting: Gastroenterology

## 2010-01-17 LAB — HM COLONOSCOPY

## 2010-02-09 ENCOUNTER — Ambulatory Visit: Payer: Self-pay | Admitting: Internal Medicine

## 2010-02-10 LAB — CONVERTED CEMR LAB
BUN: 39 mg/dL — ABNORMAL HIGH (ref 6–23)
Basophils Absolute: 0.1 10*3/uL (ref 0.0–0.1)
Basophils Relative: 1.5 % (ref 0.0–3.0)
CO2: 28 meq/L (ref 19–32)
Calcium: 9.6 mg/dL (ref 8.4–10.5)
Chloride: 105 meq/L (ref 96–112)
Creatinine, Ser: 2.1 mg/dL — ABNORMAL HIGH (ref 0.4–1.5)
Eosinophils Absolute: 0.3 10*3/uL (ref 0.0–0.7)
Eosinophils Relative: 3.3 % (ref 0.0–5.0)
GFR calc non Af Amer: 32.99 mL/min (ref >60)
Glucose, Bld: 103 mg/dL — ABNORMAL HIGH (ref 70–99)
HCT: 41.1 % (ref 39.0–52.0)
Hemoglobin: 13.9 g/dL (ref 13.0–17.0)
Iron: 64 ug/dL (ref 42–165)
Lymphocytes Relative: 18.7 % (ref 12.0–46.0)
Lymphs Abs: 1.5 10*3/uL (ref 0.7–4.0)
MCHC: 33.7 g/dL (ref 30.0–36.0)
MCV: 94.9 fL (ref 78.0–100.0)
Monocytes Absolute: 0.7 10*3/uL (ref 0.1–1.0)
Monocytes Relative: 8.6 % (ref 3.0–12.0)
Neutro Abs: 5.4 10*3/uL (ref 1.4–7.7)
Neutrophils Relative %: 67.9 % (ref 43.0–77.0)
Platelets: 198 10*3/uL (ref 150.0–400.0)
Potassium: 5.5 meq/L — ABNORMAL HIGH (ref 3.5–5.1)
RBC: 4.33 M/uL (ref 4.22–5.81)
RDW: 12.6 % (ref 11.5–14.6)
Saturation Ratios: 21.1 % (ref 20.0–50.0)
Sodium: 142 meq/L (ref 135–145)
Transferrin: 217.1 mg/dL (ref 212.0–360.0)
WBC: 8 10*3/uL (ref 4.5–10.5)

## 2010-02-11 ENCOUNTER — Ambulatory Visit: Payer: Self-pay | Admitting: Internal Medicine

## 2010-02-11 LAB — CONVERTED CEMR LAB: Potassium: 4.2 meq/L (ref 3.5–5.3)

## 2010-02-23 ENCOUNTER — Ambulatory Visit: Payer: Self-pay | Admitting: Internal Medicine

## 2010-02-23 LAB — CONVERTED CEMR LAB
Glucose, 1 Hour GTT: 172 mg/dL
Glucose, 2 hour: 86 mg/dL
Glucose, Fasting: 117 mg/dL — ABNORMAL HIGH (ref 70–99)
Glucose, GTT - 3 Hour: 88 mg/dL

## 2010-04-19 LAB — CONVERTED CEMR LAB
BUN: 42 mg/dL — ABNORMAL HIGH (ref 6–23)
CO2: 28 meq/L (ref 19–32)
Calcium, Total (PTH): 9.3 mg/dL (ref 8.4–10.5)
Calcium: 8.9 mg/dL (ref 8.4–10.5)
Chloride: 108 meq/L (ref 96–112)
Creatinine, Ser: 2 mg/dL — ABNORMAL HIGH (ref 0.4–1.5)
GFR calc non Af Amer: 35.43 mL/min (ref >60)
Glucose, Bld: 86 mg/dL (ref 70–99)
PTH: 66.2 pg/mL (ref 14.0–72.0)
Potassium: 4.7 meq/L (ref 3.5–5.1)
Sodium: 141 meq/L (ref 135–145)
Vit D, 25-Hydroxy: 37 ng/mL (ref 30–89)

## 2010-04-21 NOTE — Letter (Signed)
Summary: [facility]  [facility]   Imported By: Laural Benes 12/08/2009 12:30:29  _____________________________________________________________________  External Attachment:    Type:   Image     Comment:   External Document

## 2010-04-21 NOTE — Letter (Signed)
Summary: [facility] Instructions  [facility]  [place], [place] [address]   Phone: [phone]  Fax: [phone]       [name]    November 17, 1940    MRN: [id number]        Procedure Day /Date:  Monday 12/29/2009     Arrival Time: 10:00 am      Procedure Time: 11:00 am     Location of Procedure:                    _ x_  [place] (4th Floor)                        [place]   Starting 5 days prior to your procedure Wednesday 10/5 do not eat nuts, seeds, popcorn, corn, beans, peas,  salads, or any raw vegetables.  Do not take any fiber supplements (e.g. Metamucil, Citrucel, and Benefiber).  THE DAY BEFORE YOUR PROCEDURE         DATE: Sunday 10/9  1.  Drink clear liquids the entire day-NO SOLID FOOD  2.  Do not drink anything colored red or purple.  Avoid juices with pulp.  No orange juice.  3.  Drink at least 64 oz. (8 glasses) of fluid/clear liquids during the day to prevent dehydration and help the prep work efficiently.  CLEAR LIQUIDS INCLUDE: Water Jello Ice Popsicles Tea (sugar ok, no milk/cream) Powdered fruit flavored drinks Coffee (sugar ok, no milk/cream) Gatorade Juice: apple, white grape, white cranberry  Lemonade Clear bullion, consomm, broth Carbonated beverages (any kind) Strained chicken noodle soup Hard Candy                             4.  In the morning, mix first dose of MoviPrep solution:    Empty 1 Pouch A and 1 Pouch B into the disposable container    Add lukewarm drinking water to the top line of the container. Mix to dissolve    Refrigerate (mixed solution should be used within 24 hrs)  5.  Begin drinking the prep at 5:00 p.m. The MoviPrep container is divided by 4 marks.   Every 15 minutes drink the solution down to the next mark (approximately 8 oz) until the full liter is complete.   6.  Follow completed prep with 16 oz of clear liquid of your choice  (Nothing red or purple).  Continue to drink clear liquids until bedtime.  7.  Before going to bed, mix second dose of MoviPrep solution:    Empty 1 Pouch A and 1 Pouch B into the disposable container    Add lukewarm drinking water to the top line of the container. Mix to dissolve    Refrigerate  THE DAY OF YOUR PROCEDURE      DATE: Monday 10/10  Beginning at 6:00 a.m. (5 hours before procedure):         1. Every 15 minutes, drink the solution down to the next mark (approx 8 oz) until the full liter is complete.  2. Follow completed prep with 16 oz. of clear liquid of your choice.    3. You may drink clear liquids until 9:00 am (2 HOURS BEFORE PROCEDURE).   MEDICATION INSTRUCTIONS  Unless otherwise instructed, you should take regular prescription medications with a small sip of water   as early as possible the morning of  your procedure.  Diabetic patients - see separate instructions.  Stop taking Plavix or Aggrenox on  _  _  (7 days before procedure).     Stop taking Coumadin on  _ _  (5 days before procedure).  Additional medication instructions: _         OTHER INSTRUCTIONS  You will need a responsible adult at least 71 years of age to accompany you and drive you home.   This person must remain in the waiting room during your procedure.  Wear loose fitting clothing that is easily removed.  Leave jewelry and other valuables at home.  However, you may wish to bring a book to read or  an iPod/MP3 player to listen to music as you wait for your procedure to start.  Remove all body piercing jewelry and leave at home.  Total time from sign-in until discharge is approximately 2-3 hours.  You should go home directly after your procedure and rest.  You can resume normal activities the  day after your procedure.  The day of your procedure you should not:   Drive   Make legal decisions   Operate machinery   Drink alcohol   Return to work  You will receive  specific instructions about eating, activities and medications before you leave.    The above instructions have been reviewed and explained to me by   _______________________    I fully understand and can verbalize these instructions _____________________________ Date _________

## 2010-04-21 NOTE — Progress Notes (Signed)
Summary: Pt wants to come in today to have labs done for kidney function  Phone Note Call from Patient Call back at Work Phone [phone]   Caller: Patient Reason for Call: Acute Illness Summary of Call: Pt wants to come in today to have bloodwork done to check kindey function. Pls order.         Initial call taken by: Braulio Bosch,  June 30, 2009 9:04 AM  Follow-up for Phone Call        we can schedule for tomorrow-b/met /renal insuffiency is diagnosis- pt mnust leave cell number for [place] to contact him with results. cant do labs today- they are too busy! Follow-up by: Allyne Gee, LPN,  April 11, 624THL 9:28 AM  Additional Follow-up for Phone Call Additional follow up Details #1::        I called and lft vm for pt to cb to sch labs, as noted above.   Additional Follow-up by: Braulio Bosch,  June 30, 2009 10:11 AM    Additional Follow-up for Phone Call Additional follow up Details #2::    Pt called back and said that he would have to look at his sch and see when he can come in for lab work.  Follow-up by: Braulio Bosch,  June 30, 2009 11:06 AM

## 2010-04-21 NOTE — Assessment & Plan Note (Signed)
Summary: 4 month fup//ccm   Vital Signs:  Patient profile:   70 year old male Weight:      194 pounds Temp:     98 degrees F BP sitting:   130 / 80  Vitals Entered By: Ricard Dillon MD (February 23, 2010 10:19 PM)  Primary Care Provider:  Ricard Dillon MD   History of Present Illness: has been feeling very sleepy lately the pt has noted after meals about 4 PM falls asleep he also snores he is on a machine but does not know the settings and has been on the same settings for a year  Preventive Screening-Counseling & Management  Alcohol-Tobacco     Smoking Status: never     Tobacco Counseling: not indicated; no tobacco use  Problems Prior to Update: 1)  Reactive Hypoglycemia  (ICD-251.2) 2)  Preventive Health Care  (ICD-V70.0) 3)  Hypothyroidism  (ICD-244.9) 4)  C V A/stroke  (ICD-434.91) 5)  Knee Pain  (ICD-719.46) 6)  Chronic Kidney Disease Stage II (MILD)  (ICD-585.2) 7)  Hypersomnia With Sleep Apnea Unspecified  (ICD-780.53) 8)  Chronic Venous Hypertension With Inflammation  (ICD-459.32) 9)  Unspecified Osteoporosis  (ICD-733.00) 10)  Inguinal Pain, Right  (ICD-789.09) 11)  Internal Thrombosed Hemorrhoids  (ICD-455.1) 12)  Other Specified Sites of Sprains and Strains  (ICD-848.8) 13)  Chronic Kidney Disease Stage II (MILD)  (ICD-585.2) 14)  Renal Insufficiency, Acute  (ICD-585.9) 15)  Iron Deficiency Anemia Secondary To Blood Loss  (ICD-280.0) 16)  Hypertension  (ICD-401.9) 17)  Advef, Drug/medicinal/biological Subst Nos  (ICD-995.20) 18)  Diverticulosis, Colon  (ICD-562.10) 19)  Bplr I, Depressed, Most Recent Epsd, Mild  (ICD-296.51) 20)  Irritable Bowel Syndrome  (ICD-564.1)  Medications Prior to Update: 1)  Parnate 10 Mg  Tabs (Tranylcypromine Sulfate) .... 2 @am ;3 @bedtime  2)  Abilify 5 Mg Tabs (Aripiprazole) .... One By Mouth Daily 3)  Furosemide 20 Mg  Tabs (Furosemide) .... One By Mouth Daily 4)  Dulcolax 5 Mg Tbec (Bisacodyl) .Marland Kitchen.. 1 Once Daily 5)   Calcium Carbonate-Vitamin D 600-400 Mg-Unit Tabs (Calcium Carbonate-Vitamin D) .... One By Mouth Bid 6)  Ergocalciferol 50000 Unit Caps (Ergocalciferol) .... One By Mouth Weekly As Directed  Current Medications (verified): 1)  Parnate 10 Mg  Tabs (Tranylcypromine Sulfate) .... 2 @am ;3 @bedtime  2)  Abilify 5 Mg Tabs (Aripiprazole) .... One By Mouth Daily 3)  Furosemide 20 Mg  Tabs (Furosemide) .... One By Mouth Daily 4)  Dulcolax 5 Mg Tbec (Bisacodyl) .Marland Kitchen.. 1 Once Daily 5)  Calcium Carbonate-Vitamin D 600-400 Mg-Unit Tabs (Calcium Carbonate-Vitamin D) .... One By Mouth Bid 6)  Ergocalciferol 50000 Unit Caps (Ergocalciferol) .... One By Mouth Weekly As Directed  Allergies (verified): 1)  ! * Lithium  Past History:  Family History: Last updated: 04/11/2007 father: CVA mother: cancer  Social History: Last updated: 01/06/2007 Single Former Smoker  Risk Factors: Smoking Status: never (02/09/2010) Passive Smoke Exposure: no (10/07/2009)  Past medical, surgical, family and social histories (including risk factors) reviewed, and no changes noted (except as noted below).  Past Medical History: Reviewed history from 03/01/2008 and no changes required. bipolar irritable bowel 564.1 Diverticulosis, colon Hypertension renal insuficiency  Past Surgical History: Reviewed history from 09/21/2006 and no changes required. Colon polypectomy 09/07/2002  Family History: Reviewed history from 04/11/2007 and no changes required. father: CVA mother: cancer  Social History: Reviewed history from 01/06/2007 and no changes required. Single Former Smoker  Review of Systems  The patient denies anorexia, fever,  weight loss, weight gain, vision loss, decreased hearing, hoarseness, chest pain, syncope, dyspnea on exertion, peripheral edema, prolonged cough, headaches, hemoptysis, abdominal pain, hematochezia, severe indigestion/heartburn, hematuria, incontinence, genital sores, muscle weakness,  suspicious skin lesions, transient blindness, difficulty walking, depression, unusual weight change, abnormal bleeding, enlarged lymph nodes, angioedema, breast masses, and testicular masses.         Flu Vaccine Consent Questions     Do you have a history of severe allergic reactions to this vaccine? no    Any prior history of allergic reactions to egg and/or gelatin? no    Do you have a sensitivity to the preservative Thimersol? no    Do you have a past history of Guillan-Barre Syndrome? no    Do you currently have an acute febrile illness? no    Have you ever had a severe reaction to latex? no    Vaccine information given and explained to patient? yes    Are you currently pregnant? no    Lot Number:[id number]   Exp Date:09/19/2010   Site Given  Left Deltoid IM   Physical Exam  General:  Well-developed,well-nourished,in no acute distress; alert,appropriate and cooperative throughout examination Head:  Normocephalic and atraumatic without obvious abnormalities. No apparent alopecia or balding. Eyes:  pupils equal and pupils round.   Ears:  R ear normal and L ear normal.   Nose:  no external deformity and no nasal discharge.   Mouth:  pharynx pink and moist and no erythema.   Neck:  No deformities, masses, or tenderness noted. Lungs:  normal respiratory effort and no wheezes.   Heart:  normal rate and regular rhythm.   Abdomen:  soft and normal bowel sounds.     Impression & Recommendations:  Problem # 1:  CHRONIC KIDNEY DISEASE STAGE II (MILD) (ICD-585.2)  Labs Reviewed: BUN: 41 (10/03/2009)   Cr: 2.2 (10/03/2009)    Hgb: 12.7 (10/03/2009)   Hct: 37.0 (10/03/2009)   Ca++: 9.2 (10/03/2009)    TP: 6.4 (10/03/2009)   Alb: 3.9 (10/03/2009)  Orders: Fingerstick [id number]) Venipuncture [id number]) Specimen Handling (99000) TLB-BMP (Basic Metabolic Panel-BMET) (99991111) TLB-CBC Platelet - w/Differential (85025-CBCD)  Problem # 2:  HYPERSOMNIA WITH SLEEP APNEA UNSPECIFIED  (ICD-780.53) needs cpap monitering will refer to [facility] for monitering of the chip in the machine the patient has the pressure adjusted lower recently  Problem # 3:  REACTIVE HYPOGLYCEMIA (ICD-251.2)  glucose toleranace  Orders: Misc. Referral (Misc. Ref)  Complete Medication List: 1)  Parnate 10 Mg Tabs (Tranylcypromine sulfate) .... 2 @am ;3 @bedtime  2)  Abilify 5 Mg Tabs (Aripiprazole) .... One by mouth daily 3)  Furosemide 20 Mg Tabs (Furosemide) .... One by mouth daily 4)  Dulcolax 5 Mg Tbec (Bisacodyl) .Marland Kitchen.. 1 once daily 5)  Calcium Carbonate-vitamin D 600-400 Mg-unit Tabs (Calcium carbonate-vitamin d) .... One by mouth bid 6)  Ergocalciferol 50000 Unit Caps (Ergocalciferol) .... One by mouth weekly as directed  Other Orders: Flu Vaccine 33yrs + MEDICARE PATIENTS [id number]) Administration Flu vaccine - MCR (G0008) TLB-IBC Pnl (Iron/FE;Transferrin) (83550-IBC)  Patient Instructions: 1)  Please schedule a follow-up appointment in 4 months.   Orders Added: 1)  Flu Vaccine 46yrs + MEDICARE PATIENTS [Q2039] 2)  Administration Flu vaccine - MCR [id number] 3)  Fingerstick [36416] 4)  Est. Patient Level IV [id number] 5)  Venipuncture [id number] 6)  Specimen Handling [99000] 7)  Misc. Referral [Misc. Ref] 8)  TLB-BMP (Basic Metabolic Panel-BMET) 123456 9)  TLB-CBC Platelet - w/Differential [85025-CBCD] 10)  TLB-IBC Pnl (Iron/FE;Transferrin) [83550-IBC]

## 2010-04-21 NOTE — Progress Notes (Signed)
Summary: Cpap?  Phone Note Call from Patient   Caller: Patient Call For: Ricard Dillon MD Reason for Call: Talk to Nurse Summary of Call: Pt thinks the pressure is too high on his Cpap, and it is leaking????  Wants an appt, and to lower the pressure.  [id number] Initial call taken by: Deanna Artis CMA,  August 28, 2009 11:40 AM  Follow-up for Phone Call        call the company  that provides the c- pap and have them run an over night pulse ox Follow-up by: Allyne Gee, LPN,  June  9, 624THL 075-GRM AM  Additional Follow-up for Phone Call Additional follow up Details #1::        Per Dr. Arnoldo Morale send copy of sleep study and order for auto titration to [facility], and have them contact pt.  Done. [phone] Additional Follow-up by: Deanna Artis CMA,  August 28, 2009 1:12 PM

## 2010-04-21 NOTE — Letter (Signed)
Summary: Previsit letter  [facility]  [place], [place] [address]   Phone: [phone]  Fax: [phone]       10/17/2009 [name]  [place], [place]  [address]  Dear Mr. Powley,  Welcome to the Gastroenterology Division at [facility].    You are scheduled to see a nurse for your pre-procedure visit on 11-12-09 at 4:30P.M. on the 3rd floor at [facility], [place] [facility].  We ask that you try to arrive at our office 15 minutes prior to your appointment time to allow for check-in.  Your nurse visit will consist of discussing your medical and surgical history, your immediate family medical history, and your medications.    Please bring a complete list of all your medications or, if you prefer, bring the medication bottles and we will list them.  We will need to be aware of both prescribed and over the counter drugs.  We will need to know exact dosage information as well.  If you are on blood thinners (Coumadin, Plavix, Aggrenox, Ticlid, etc.) please call our office today/prior to your appointment, as we need to consult with your physician about holding your medication.   Please be prepared to read and sign documents such as consent forms, a financial agreement, and acknowledgement forms.  If necessary, and with your consent, a friend or relative is welcome to sit-in on the nurse visit with you.  Please bring your insurance card so that we may make a copy of it.  If your insurance requires a referral to see a specialist, please bring your referral form from your primary care physician.  No co-pay is required for this nurse visit.     If you cannot keep your appointment, please call [phone] to cancel or reschedule prior to your appointment date.  This allows [place] the opportunity to schedule an appointment for another patient in need of care.    Thank you for choosing [facility] for your  medical needs.  We appreciate the opportunity to care for you.  Please visit [place] at our website  to learn more about our practice.                     Sincerely.                                                                                                                   The Gastroenterology Division

## 2010-04-21 NOTE — Letter (Signed)
Summary: Patient Notice- Polyp Results  [facility]  [address] [name], [place] [address]   Phone: [phone]  Fax: [phone]        December 31, 2009 [name]    [name], [name]  [address]    Dear [name],  I am pleased to inform you that the colon polyp(s) removed during your recent colonoscopy was (were) found to be benign (no cancer detected) upon pathologic examination.  I recommend you have a repeat colonoscopy examination in 5_ years to look for recurrent polyps, as having colon polyps increases your risk for having recurrent polyps or even colon cancer in the future.  Should you develop new or worsening symptoms of abdominal pain, bowel habit changes or bleeding from the rectum or bowels, please schedule an evaluation with either your primary care physician or with me.  Additional information/recommendations:  __ No further action with gastroenterology is needed at this time. Please      follow-up with your primary care physician for your other healthcare      needs.  __ Please call [phone] to schedule a return visit to review your      situation.  __ Please keep your follow-up visit as already scheduled.  _x_ Continue treatment plan as outlined the day of your exam.  Please call us if you are having persistent problems or have questions about your condition that have not been fully answered at this time.  Sincerely,  Inda Castle MD  This letter has been electronically signed by your physician.  Appended Document: Patient Notice- Polyp Results letter mailed

## 2010-04-21 NOTE — Miscellaneous (Signed)
Summary: previsit prep/RM  Clinical Lists Changes  Medications: Added new medication of MOVIPREP 100 GM  SOLR (PEG-KCL-NACL-NASULF-NA ASC-C) As per prep instructions. - Signed Rx of MOVIPREP 100 GM  SOLR (PEG-KCL-NACL-NASULF-NA ASC-C) As per prep instructions.;  #1 x 0;  Signed;  Entered by: Sundra Aland RN;  Authorized by: Inda Castle MD;  Method used: Electronically to [facility]. [id number]*, [address] [place], [place], [place]  [address], Ph: [phone], Fax: [phone] Observations: Added new observation of ALLERGY REV: Done (12/08/2009 14:20)    Prescriptions: MOVIPREP 100 GM  SOLR (PEG-KCL-NACL-NASULF-NA ASC-C) As per prep instructions.  #1 x 0   Entered by:   Sundra Aland RN   Authorized by:   Inda Castle MD   Signed by:   Sundra Aland RN on 12/08/2009   Method used:   Electronically to        [facility]. [phone]* (retail)       [place]       [place], [place]  [address]       Ph: [phone]       Fax: [phone]   RxID:   [id number]

## 2010-04-21 NOTE — Procedures (Signed)
Summary: Colonoscopy  Patient: [name] Note: All result statuses are Final unless otherwise noted.  Tests: (1) Colonoscopy (COL)   COL Colonoscopy           [facility]     [place], [place]  [address]           COLONOSCOPY PROCEDURE REPORT           PATIENT:  [name], [name]  MR#:  [id number]     BIRTHDATE:  [birth year], 68 yrs. old  GENDER:  male           ENDOSCOPIST:  Sandy Salaam. Deatra Ina, MD     Referred by:           PROCEDURE DATE:  12/29/2009     PROCEDURE:  Colonoscopy with snare polypectomy, Colon with cold     biopsy polypectomy     ASA CLASS:  Class II     INDICATIONS:  1) screening  2) history of pre-cancerous     (adenomatous) colon polyps Index polypectomy 2004           MEDICATIONS:   Fentanyl 50 mcg IV, Versed 5 mg IV           DESCRIPTION OF PROCEDURE:   After the risks benefits and     alternatives of the procedure were thoroughly explained, informed     consent was obtained.  Digital rectal exam was performed and     revealed no abnormalities.   The LB 180AL [id number] endoscope was     introduced through the anus and advanced to the cecum, which was     identified by the ileocecal valve, without limitations.  The     quality of the prep was adequate, using MiraLax.  The instrument     was then slowly withdrawn as the colon was fully examined.     <<PROCEDUREIMAGES>>           FINDINGS:  A sessile polyp was found in the descending colon. It     was 12 mm in size. Polyps were snared, then cauterized with     monopolar cautery. Retrieval was successful (see image11). snare     polyp  A sessile polyp was found in thedistal descending colon. It     was 3 mm in size. Polyp was snared without cautery. Retrieval was     successful (see image14). snare polyp  Two polyps were found in     the rectum. 2 1-34mm sessile polyps The polyp was removed using     cold biopsy forceps (see image18).  Moderate diverticulosis was     found  in the sigmoid to descending colon segments (see image16).     Moderately severe diverticulosis  This was otherwise a normal     examination of the colon (see image3, image6, image7, image9, and     image17).   Retroflexed views in the rectum revealed no     abnormalities.    The time to cecum =  3.75  minutes. The scope     was then withdrawn (time =  14.75  min) from the patient and the     procedure completed.           COMPLICATIONS:  None           ENDOSCOPIC IMPRESSION:     1) 12 mm sessile polyp in the descending colon     2)  3 mm sessile polyp in the descending colon     3) Two polyps in the rectum     4) Moderate diverticulosis in the sigmoid to descending colon     segments     5) Otherwise normal examination     RECOMMENDATIONS:     1) If the polyp(s) removed today are proven to be adenomatous     (pre-cancerous) polyps, you will need a repeat colonoscopy in 5     years. Otherwise you should continue to follow colorectal cancer     screening guidelines for "routine risk" patients with colonoscopy     in 10 years.           REPEAT EXAM:   You will receive a letter from Dr. Deatra Ina in 1-2     weeks, after reviewing the final pathology, with followup     recommendations.           ______________________________     Sandy Salaam Deatra Ina, MD           CC: Ricard Dillon, MD           n.     Lorrin Mais:   Sandy Salaam. [name] at 12/29/2009 11:53 AM           [name], [id number]  Note: An exclamation mark (!) indicates a result that was not dispersed into the flowsheet. Document Creation Date: 12/29/2009 11:53 AM _______________________________________________________________________  (1) Order result status: Final Collection or observation date-time: 12/29/2009 11:44 Requested date-time:  Receipt date-time:  Reported date-time:  Referring Physician:   Ordering Physician: Erskine Emery [phone]) Specimen Source:  Source: Tawanna Cooler Order Number: [id number] Lab site:     Appended Document: Colonoscopy     Procedures Next Due Date:    Colonoscopy: 12/2014

## 2010-04-21 NOTE — Assessment & Plan Note (Signed)
Summary: CPX // RS   Vital Signs:  Patient profile:   70 year old male Height:      67 inches Weight:      194 pounds BMI:     30.49 Temp:     98.2 degrees F oral Pulse rate:   80 / minute Resp:     14 per minute BP sitting:   130 / 80  (left arm)  Vitals Entered By: Allyne Gee, LPN (July 19, 624THL 075-GRM PM) CC: annual visit for disease management Is Patient Diabetic? No Pain Assessment Patient in pain? no      Nutritional Status BMI of > 30 = obese  Does patient need assistance? Functional Status Self care Ambulation Normal  Vision Screening:Left eye with correction: 95 / 67 Right eye with correction: 41 / 63 Both eyes with correction: 20 / 20  Color vision testing: normal     Vision Comments: miller vision 40db HL: Left  Right  Audiometry Comment: stable    CC:  annual visit for disease management.  History of Present Illness:  Here for Medicare AWV:  1.   Risk factors based on Past M, S, F history: reviewed in chart  2.   Physical Activities:  pt walks and has jointed the [employer] 3.   Depression/mood: has bipolar dx and is stable on medications  4.   Hearing:  can hear  whispered voice at 6 feet 5.   ADL's:  can perform all necessary tasks of daily living 6.   Fall Risk:  no current fall risl 7.   Home Safety:  lives in two story house 8.   Height, weight, &visual acuity: stable 9.   Counseling:  reviewed the labs 10.   Labs ordered based on risk factors:   lipid, liver,  psa and renal ordered as well as CBC 11.           Referral Coordination  [facility] orthopedist and psychiatrist 12.           Care Plansee forms 13.            Cognitive Assessment stable   Preventive Screening-Counseling & Management  Alcohol-Tobacco     Smoking Status: never     Passive Smoke Exposure: no  Problems Prior to Update: 1)  Hypothyroidism  (ICD-244.9) 2)  C V A/stroke  (ICD-434.91) 3)  Knee Pain  (ICD-719.46) 4)  Chronic Kidney Disease Stage II (MILD)   (ICD-585.2) 5)  Hypersomnia With Sleep Apnea Unspecified  (ICD-780.53) 6)  Chronic Venous Hypertension With Inflammation  (ICD-459.32) 7)  Unspecified Osteoporosis  (ICD-733.00) 8)  Inguinal Pain, Right  (ICD-789.09) 9)  Internal Thrombosed Hemorrhoids  (ICD-455.1) 10)  Other Specified Sites of Sprains and Strains  (ICD-848.8) 11)  Chronic Kidney Disease Stage II (MILD)  (ICD-585.2) 12)  Renal Insufficiency, Acute  (ICD-585.9) 13)  Iron Deficiency Anemia Secondary To Blood Loss  (ICD-280.0) 14)  Hypertension  (ICD-401.9) 15)  Advef, Drug/medicinal/biological Subst Nos  (ICD-995.20) 16)  Diverticulosis, Colon  (ICD-562.10) 17)  Bplr I, Depressed, Most Recent Epsd, Mild  (ICD-296.51) 18)  Irritable Bowel Syndrome  (ICD-564.1)  Current Problems (verified): 1)  Hypothyroidism  (ICD-244.9) 2)  C V A/stroke  (ICD-434.91) 3)  Knee Pain  (ICD-719.46) 4)  Chronic Kidney Disease Stage II (MILD)  (ICD-585.2) 5)  Hypersomnia With Sleep Apnea Unspecified  (ICD-780.53) 6)  Chronic Venous Hypertension With Inflammation  (ICD-459.32) 7)  Unspecified Osteoporosis  (ICD-733.00) 8)  Inguinal Pain, Right  (  ICD-789.09) 9)  Internal Thrombosed Hemorrhoids  (ICD-455.1) 10)  Other Specified Sites of Sprains and Strains  (ICD-848.8) 11)  Chronic Kidney Disease Stage II (MILD)  (ICD-585.2) 12)  Renal Insufficiency, Acute  (ICD-585.9) 13)  Iron Deficiency Anemia Secondary To Blood Loss  (ICD-280.0) 14)  Hypertension  (ICD-401.9) 15)  Advef, Drug/medicinal/biological Subst Nos  (ICD-995.20) 16)  Diverticulosis, Colon  (ICD-562.10) 17)  Bplr I, Depressed, Most Recent Epsd, Mild  (ICD-296.51) 18)  Irritable Bowel Syndrome  (ICD-564.1)  Medications Prior to Update: 1)  Parnate 10 Mg  Tabs (Tranylcypromine Sulfate) .... 2 @am ;2 @ Noon and 1 At Bedtime 2)  Abilify 5 Mg Tabs (Aripiprazole) .... One By Mouth Daily 3)  Furosemide 20 Mg  Tabs (Furosemide) .... One By Mouth Daily 4)  Dulcolax 5 Mg Tbec (Bisacodyl)  .Marland Kitchen.. 1 Once Daily 5)  Calcium Carbonate-Vitamin D 600-400 Mg-Unit Tabs (Calcium Carbonate-Vitamin D) .... One By Mouth Bid 6)  Ergocalciferol 50000 Unit Caps (Ergocalciferol) .... One By Mouth Weekly As Directed  Current Medications (verified): 1)  Parnate 10 Mg  Tabs (Tranylcypromine Sulfate) .... 2 @am ;3 @bedtime  2)  Abilify 5 Mg Tabs (Aripiprazole) .... One By Mouth Daily 3)  Furosemide 20 Mg  Tabs (Furosemide) .... One By Mouth Daily 4)  Dulcolax 5 Mg Tbec (Bisacodyl) .Marland Kitchen.. 1 Once Daily 5)  Calcium Carbonate-Vitamin D 600-400 Mg-Unit Tabs (Calcium Carbonate-Vitamin D) .... One By Mouth Bid 6)  Ergocalciferol 50000 Unit Caps (Ergocalciferol) .... One By Mouth Weekly As Directed  Allergies (verified): 1)  ! * Lithium  Past History:  Family History: Last updated: 04/11/2007 father: CVA mother: cancer  Social History: Last updated: 01/06/2007 Single Former Smoker  Risk Factors: Smoking Status: never (10/07/2009) Passive Smoke Exposure: no (10/07/2009)  Past medical, surgical, family and social histories (including risk factors) reviewed, and no changes noted (except as noted below).  Past Medical History: Reviewed history from 03/01/2008 and no changes required. bipolar irritable bowel 564.1 Diverticulosis, colon Hypertension renal insuficiency  Past Surgical History: Reviewed history from 09/21/2006 and no changes required. Colon polypectomy 09/07/2002  Family History: Reviewed history from 04/11/2007 and no changes required. father: CVA mother: cancer  Social History: Reviewed history from 01/06/2007 and no changes required. Single Former Smoker  Review of Systems  The patient denies anorexia, fever, weight loss, weight gain, vision loss, decreased hearing, hoarseness, chest pain, syncope, dyspnea on exertion, peripheral edema, prolonged cough, headaches, hemoptysis, abdominal pain, melena, hematochezia, severe indigestion/heartburn, hematuria, incontinence,  genital sores, muscle weakness, suspicious skin lesions, transient blindness, difficulty walking, depression, unusual weight change, abnormal bleeding, enlarged lymph nodes, angioedema, breast masses, and testicular masses.    Physical Exam  General:  Well-developed,well-nourished,in no acute distress; alert,appropriate and cooperative throughout examination Head:  Normocephalic and atraumatic without obvious abnormalities. No apparent alopecia or balding. Eyes:  pupils equal and pupils round.   Ears:  R ear normal and L ear normal.   Nose:  no external deformity and no nasal discharge.   Mouth:  pharynx pink and moist and no erythema.   Neck:  No deformities, masses, or tenderness noted. Chest Wall:  no tenderness and barrel-chest deformity.   Breasts:  no gynecomastia and no adenopathy.   Lungs:  normal respiratory effort and no wheezes.   Heart:  normal rate and regular rhythm.   Abdomen:  soft and normal bowel sounds.   Genitalia:  circumcised and no urethral discharge.   Prostate:  no gland enlargement and no asymmetry.   Msk:  No deformity  or scoliosis noted of thoracic or lumbar spine.   Pulses:  R and L carotid,radial,femoral,dorsalis pedis and posterior tibial pulses are full and equal bilaterally Extremities:  No clubbing, cyanosis, edema, or deformity noted with normal full range of motion of all joints.   Neurologic:  No cranial nerve deficits noted. Station and gait are normal. Plantar reflexes are down-going bilaterally. DTRs are symmetrical throughout. Sensory, motor and coordinative functions appear intact.   Impression & Recommendations:  Problem # 1:  HYPOTHYROIDISM (ICD-244.9) reviewed screening labs Labs Reviewed: TSH: 1.46 (10/03/2009)   Free T4: 0.8 (01/06/2007)    Chol: 138 (10/03/2009)   HDL: 51.30 (10/03/2009)   LDL: 68 (10/03/2009)   TG: 93.0 (10/03/2009)  Problem # 2:  KNEE PAIN (ICD-719.46)  seeing handy and informed not to take NSAID  Discussed  strengthening exercises, use of ice or heat, and medications.   Problem # 3:  CHRONIC KIDNEY DISEASE STAGE II (MILD) (ICD-585.2)  Labs Reviewed: BUN: 41 (10/03/2009)   Cr: 2.2 (10/03/2009)    Hgb: 12.7 (10/03/2009)   Hct: 37.0 (10/03/2009)   Ca++: 9.2 (10/03/2009)    TP: 6.4 (10/03/2009)   Alb: 3.9 (10/03/2009)  Problem # 4:  HYPERTENSION (ICD-401.9) stable His updated medication list for this problem includes:    Furosemide 20 Mg Tabs (Furosemide) ..... One by mouth daily  BP today: 130/80 Prior BP: 130/80 (02/11/2009)  Prior 10 Yr Risk Heart Disease: 9 % (09/25/2008)  Labs Reviewed: K+: 5.2 (10/03/2009) Creat: : 2.2 (10/03/2009)   Chol: 138 (10/03/2009)   HDL: 51.30 (10/03/2009)   LDL: 68 (10/03/2009)   TG: 93.0 (10/03/2009)  Problem # 5:  IRON DEFICIENCY ANEMIA SECONDARY TO BLOOD LOSS (ICD-280.0)  Hgb: 12.7 (10/03/2009)   Hct: 37.0 (10/03/2009)   Platelets: 201.0 (10/03/2009) RBC: 3.89 (10/03/2009)   RDW: 13.4 (10/03/2009)   WBC: 7.4 (10/03/2009) MCV: 95.1 (10/03/2009)   MCHC: 34.3 (10/03/2009) Iron: 53 (02/11/2009)   % Sat: 22.0 (02/11/2009) TSH: 1.46 (10/03/2009)  Problem # 6:  IRRITABLE BOWEL SYNDROME (ICD-564.1)  stable  Orders: Gastroenterology Referral (GI)  Problem # 7:  Ezel (ICD-V70.0)  v70.00 Colonoscopy: Abnormal (09/07/2002) Td Booster: Td (11/11/2004)   Flu Vax: Fluvax 3+ (12/18/2008)   Pneumovax: Pneumovax (Medicare) (03/23/2006) Chol: 138 (10/03/2009)   HDL: 51.30 (10/03/2009)   LDL: 68 (10/03/2009)   TG: 93.0 (10/03/2009) TSH: 1.46 (10/03/2009)   PSA: 0.87 (10/03/2009)  Discussed using sunscreen, use of alcohol, drug use, self testicular exam, routine dental care, routine eye care, routine physical exam, seat belts, multiple vitamins, osteoporosis prevention, adequate calcium intake in diet, and recommendations for immunizations.  Discussed exercise and checking cholesterol.  Discussed gun safety, safe sex, and contraception. Also  recommend checking PSA.  Orders: Gastroenterology Referral (GI) First annual wellness visit with prevention plan  [id number])  Complete Medication List: 1)  Parnate 10 Mg Tabs (Tranylcypromine sulfate) .... 2 @am ;3 @bedtime  2)  Abilify 5 Mg Tabs (Aripiprazole) .... One by mouth daily 3)  Furosemide 20 Mg Tabs (Furosemide) .... One by mouth daily 4)  Dulcolax 5 Mg Tbec (Bisacodyl) .Marland Kitchen.. 1 once daily 5)  Calcium Carbonate-vitamin D 600-400 Mg-unit Tabs (Calcium carbonate-vitamin d) .... One by mouth bid 6)  Ergocalciferol 50000 Unit Caps (Ergocalciferol) .... One by mouth weekly as directed  Patient Instructions: 1)  avoid aleve and motrin and any other nonsteroidal 2)  you may iuse extra strength tylenoll 3)  Please schedule a follow-up appointment in 4 months.  Prevention & Chronic Care Immunizations  Influenza vaccine: Fluvax 3+  (12/18/2008)   Influenza vaccine due: 11/21/2010    Tetanus booster: 11/11/2004: Td   Tetanus booster due: 11/12/2014    Pneumococcal vaccine: Pneumovax (Medicare)  (03/23/2006)   Pneumococcal vaccine deferral: Not indicated  (10/07/2009)    H. zoster vaccine: Not documented  Colorectal Screening   Hemoccult: Not documented    Colonoscopy: Abnormal  (09/07/2002)   Colonoscopy action/deferral: GI referral  (10/07/2009)  Other Screening   PSA: 0.87  (10/03/2009)   PSA action/deferral: Not indicated  (10/07/2009)   PSA due due: 10/08/2010   Smoking status: never  (10/07/2009)  Lipids   Total Cholesterol: 138  (10/03/2009)   LDL: 68  (10/03/2009)   LDL Direct: Not documented   HDL: 51.30  (10/03/2009)   Triglycerides: 93.0  (10/03/2009)  Hypertension   Last Blood Pressure: 130 / 80  (10/07/2009)   Serum creatinine: 2.2  (10/03/2009)   Serum potassium 5.2  (10/03/2009)    Hypertension flowsheet reviewed?: Yes   Progress toward BP goal: At goal    Stage of readiness to change (hypertension management): Maintenance  Self-Management  Support :    Patient will work on the following items until the next clinic visit to reach self-care goals:     Medications and monitoring: take my medicines every day  (10/07/2009)     Eating: eat more vegetables  (10/07/2009)     Activity: take a 30 minute walk every day  (10/07/2009)    Hypertension self-management support: BP self-monitoring log  (10/07/2009)

## 2010-04-21 NOTE — Medication Information (Signed)
Summary: Order for CPAP Supplies/[facility]  Order for CPAP Supplies/[facility]   Imported By: Laural Benes 10/03/2009 15:31:28  _____________________________________________________________________  External Attachment:    Type:   Image     Comment:   External Document

## 2010-05-06 ENCOUNTER — Other Ambulatory Visit: Payer: Self-pay | Admitting: Internal Medicine

## 2010-06-12 ENCOUNTER — Ambulatory Visit: Payer: Self-pay | Admitting: Internal Medicine

## 2010-06-18 ENCOUNTER — Encounter: Payer: Self-pay | Admitting: Internal Medicine

## 2010-06-19 ENCOUNTER — Ambulatory Visit (INDEPENDENT_AMBULATORY_CARE_PROVIDER_SITE_OTHER): Payer: MEDICARE | Admitting: Internal Medicine

## 2010-06-19 ENCOUNTER — Encounter: Payer: Self-pay | Admitting: Internal Medicine

## 2010-06-19 VITALS — BP 130/80 | HR 76 | Temp 98.2°F | Resp 16 | Ht 67.0 in | Wt 194.0 lb

## 2010-06-19 DIAGNOSIS — I10 Essential (primary) hypertension: Secondary | ICD-10-CM

## 2010-06-19 DIAGNOSIS — E039 Hypothyroidism, unspecified: Secondary | ICD-10-CM

## 2010-06-19 DIAGNOSIS — G4730 Sleep apnea, unspecified: Secondary | ICD-10-CM

## 2010-06-19 DIAGNOSIS — N182 Chronic kidney disease, stage 2 (mild): Secondary | ICD-10-CM

## 2010-06-19 DIAGNOSIS — G4710 Hypersomnia, unspecified: Secondary | ICD-10-CM

## 2010-06-19 LAB — BASIC METABOLIC PANEL
BUN: 35 mg/dL — ABNORMAL HIGH (ref 6–23)
CO2: 29 mEq/L (ref 19–32)
Calcium: 9.2 mg/dL (ref 8.4–10.5)
Chloride: 105 mEq/L (ref 96–112)
Creatinine, Ser: 2.2 mg/dL — ABNORMAL HIGH (ref 0.4–1.5)
GFR: 32.08 mL/min — ABNORMAL LOW (ref >60.00)
Glucose, Bld: 78 mg/dL (ref 70–99)
Potassium: 5 mEq/L (ref 3.5–5.1)
Sodium: 141 mEq/L (ref 135–145)

## 2010-06-19 LAB — TSH: TSH: 1.17 u[IU]/mL (ref 0.35–5.50)

## 2010-06-19 LAB — T3, FREE: T3, Free: 2.2 pg/mL — ABNORMAL LOW (ref 2.3–4.2)

## 2010-06-19 LAB — T4, FREE: Free T4: 0.86 ng/dL (ref 0.60–1.60)

## 2010-06-19 NOTE — Assessment & Plan Note (Signed)
Monitoring his creatinine and BUN for his renal function

## 2010-06-19 NOTE — Assessment & Plan Note (Signed)
Wears his CPAP every night

## 2010-06-19 NOTE — Assessment & Plan Note (Signed)
Current blood pressure 130/80 is acceptable the patient is tolerating medications well

## 2010-06-19 NOTE — Assessment & Plan Note (Signed)
Due thyroid monitoring Mild weight gain and dry skin

## 2010-06-19 NOTE — Progress Notes (Signed)
  Subjective:    Patient ID: [name], male    DOB: 07/18/1940, 70 y.o.   MRN: [id number]  HPI Patient is 70 year old man who presents for hypothyroidism reactive hypoglycemia having had a three-hour glucose tolerance test last visit a history of iron deficiency anemia history of bipolar disease and a history of chronic renal insufficiency that was probably due to lithium therapy.  He is stable his medications states that he has had no problem with urination edema or back pain his blood pressures been well controlled   Review of Systems  Constitutional: Negative for fever and fatigue.  HENT: Negative for hearing loss, congestion, neck pain and postnasal drip.   Eyes: Negative for discharge, redness and visual disturbance.  Respiratory: Negative for cough, shortness of breath and wheezing.   Cardiovascular: Negative for leg swelling.  Gastrointestinal: Negative for abdominal pain, constipation and abdominal distention.  Genitourinary: Negative for urgency and frequency.  Musculoskeletal: Negative for joint swelling and arthralgias.  Skin: Negative for color change and rash.  Neurological: Negative for weakness and light-headedness.  Hematological: Negative for adenopathy.  Psychiatric/Behavioral: Negative for behavioral problems.   Past Medical History  Diagnosis Date  . Bipolar 1 disorder   . Irritable bowel syndrome   . Diverticulosis   . Hypertension   . 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 Colonoscopy w/ polypectomy 09/07/2002    reports that he has never smoked. He does not have any smokeless tobacco history on file. He reports that he does not drink alcohol or use illicit drugs. family history includes Cancer in his mother and Stroke in his father. Allergies not on file     Objective:   Physical Exam  Constitutional: He is oriented to person, place,  and time. He appears well-developed and well-nourished.  HENT:  Head: Normocephalic and atraumatic.  Eyes: Conjunctivae are normal. Pupils are equal, round, and reactive to light.  Neck: Normal range of motion. Neck supple.  Cardiovascular: Normal rate and regular rhythm.   Pulmonary/Chest: Effort normal and breath sounds normal.  Abdominal: Soft. Bowel sounds are normal.  Neurological: He is alert and oriented to person, place, and time.  Skin: Skin is warm and dry.          Assessment & Plan:

## 2010-07-23 ENCOUNTER — Other Ambulatory Visit: Payer: Self-pay | Admitting: Internal Medicine

## 2010-08-07 NOTE — Discharge Summary (Signed)
[place]  Patient:    [name], [name] Visit Number: [id number] MRN: [id number]          Service Type: PSY Location: [place] 01 Attending Physician:  Rulon Eisenmenger Dictated by:   Rulon Eisenmenger, M.D. Admit Date:  03/27/2001 Discharge Date: 03/30/2001                             Discharge Summary  IDENTIFYING DATA:  This is a 70 year old divorced Caucasian male voluntarily admitted for depression and suicidal ideation.  ADMISSION MEDICATIONS: 1. Lexapro 10 mg q.d.; been on forthree weeks. 2. Ritalin; been on for three weeks to boost energy. 3. Eskalith; three weeks.  ALLERGIES:   No known drug allergies.  PHYSICAL EXAMINATION:  GENERAL:  Essentially within normal limits.  NEUROLOGIC:  Nonfocal and intact.  ROUTINE ADMISSION LABORATORY DATA:  Essentially within normal limits.  CBC with differential: Slightly elevated white count 11.2.  Comprehensive metabolic panel and thyroid panel: Within normal limits.  Urine toxicity screen: Negative.  MENTAL STATUS EXAMINATION:  Alert, middle-aged, neatly dressed, cooperative Caucasian male with good eye contact.  Speech: Within normal limits.  Mood was depressed.  Affect: Flat.  Thought process: Coherent with no evidence of psychosis or suicidal or homicidal ideations.  Cognitive: Intact.  Judgment and insight: Poor.  ADMITTING DIAGNOSES: Axis I:  Bipolar, mixed. Axis II:   None. Axis III:  None. Axis IV:   Moderate problems with primary support group. Axis V:    [place] COURSE:  The patient was ordered routine p.r.n. medications and lithium, amitriptyline, Ritalin, Lexapro were restarted.  On the following day amitriptyline and Ritalin were discontinued.  Lexapro was optimized as indicated for depression.  The patient tolerated adjustments in medications.  CONDITION AT DISCHARGE:  Markedly improved.  Mood was more euthymic.  Affect: Brighter.  Thought process: Goal directed.   Thought content: Negative for psychotic symptoms or dangerous ideation.  Judgment and insight had improved and the patient reported motivation to be compliant with follow-up plans.  DISCHARGE MEDICATIONS: 1. Lexapro 20 mg q.a.m. 2. Lithium CR 450 mg b.i.d. 3. Pepcid 20 mg q.a.m. 4. The patient was taken off of Ritalin and was to follow up with Dr. Casimiro Needle    January 16 at 2:30.  DISCHARGE MEDICATIONS: Axis I:    Bipolar, mixed. Axis II:   None. Axis III:  None. Axis IV:   Moderate problems with primary support group. Axis V:    Global assessment of functioning on discharge was 79. Dictated by:   Rulon Eisenmenger, M.D. Attending Physician:  Rulon Eisenmenger DD:  05/03/98 TD:  05/04/01 Job: [id number]

## 2010-08-07 NOTE — H&P (Signed)
[place]  Patient:    [name], [name] Visit Number: [id number] MRN: [id number]          Service Type: PSY Location: [place] 01 Attending Physician:  Rulon Eisenmenger Dictated by:   Harlin Rain. Orsini, N.P. Admit Date:  03/27/2001 Discharge Date: 03/30/2001                     Psychiatric Admission Assessment  DATE OF ADMISSION:  April 16, 2001  PATIENT IDENTIFICATION:  This is a 70 year old divorced white male voluntarily admitted on April 16, 2001, for depression and suicidal ideation.  HISTORY OF PRESENT ILLNESS:  The patient presents with a history of depression.  The patient feels "down" and "helpless," feeling disgusted over his illness.  He reports passive suicidal ideation, that he is just tired of fighting.  He has been in bed for the past couple of days with no appetite. He denies any psychotic symptoms.  He has been sleeping fair at home with a 10 pound change in his weight with a weight loss.  He denies any current suicidal or homicidal ideation and has been compliant with his medication.  PAST PSYCHIATRIC HISTORY:  Sees Dr. Casimiro Needle as an outpatient since May.  Last visit was on March 06, 2001.  This is his first hospitalization at [facility], hospitalized in [place] about three years ago for depression.  He has had reports of depression for years since his early teens, no suicidal attempts.  SUBSTANCE ABUSE HISTORY:  Nonsmoker.  Denies any alcohol or substance abuse. PAST MEDICAL HISTORY:  Primary care Pearla Mckinny: The patient was unable to recall.  Medical problems: None.  MEDICATIONS: 1. Lexapro 10 mg every day; has been on that for three weeks. 2. Ritalin for the past three weeks to give him a "boost;" reports no    difference. 3. He has been on Eskalith for the past three weeks.  DRUG ALLERGIES:  No known allergies.  PHYSICAL EXAMINATION:  VITAL SIGNS:  Temperature 98.4, heart rate 110,  respirations 26, blood pressure 143/83.  GENERAL:  The patient presents as a well-nourished, well-dressed neatly Caucasian male without any complaints.  LABORATORY DATA:  WBC count elevated at 11.2.  Glucose 121, BUN 25. Urinalysis: Within normal limits.  SOCIAL HISTORY:  A 70 year old divorced white male, divorced since 1989.  He has three children.  He lives alone.  He works part time on [employer]. Completed the 12th grade.  He has no legal problems and reports that he is engaged.  FAMILY HISTORY:  Father with bipolar.  MENTAL STATUS EXAMINATION:  He is an alert, middle-aged, neatly dressed, cooperative Caucasian male with good eye contact.  Speech is normal, relevant, and articulate.  Mood is depressed.  Affect is flat but pleasant.  Thought processes are coherent.  There is no evidence of psychosis, no auditory or visual hallucinations, no suicidal or homicidal ideations, no paranoia. Cognitive: Intact.  Memory is good.  Oriented x 3.  Judgment is good.  Insight is good.  ADMISSION DIAGNOSES: Axis I:    Bipolar, mixed, depressed. Axis II:   Deferred. Axis III:  None. Axis IV:   Deferred. Axis V:    Current is 35, estimated this past year is 64-70.  INITIAL PLAN OF CARE:  Plan is a voluntary admission to [facility] for depression and fleeting suicidal thoughts.  Contract for safety. Check every 15 minutes.  The patient promises safety.  Will resume his Eskalith and Lexapro, obtain  labs and a lithium level.  Will contact Dr. Casimiro Needle for office notes and clarification of his Lexapro.  Goal is to stabilize mood and thinking so the patient can be safe and functional and to follow up with Dr. Casimiro Needle as an outpatient.  ESTIMATED LENGTH OF STAY:  Three to five days. Dictated by:   Harlin Rain. Orsini, N.P. Attending Physician:  Rulon Eisenmenger DD:  04/17/01 TD:  04/17/01 Job: [id number]

## 2010-10-20 ENCOUNTER — Encounter: Payer: Self-pay | Admitting: Internal Medicine

## 2010-10-20 ENCOUNTER — Ambulatory Visit (INDEPENDENT_AMBULATORY_CARE_PROVIDER_SITE_OTHER): Payer: Medicare Other | Admitting: Internal Medicine

## 2010-10-20 VITALS — BP 136/80 | HR 76 | Temp 98.0°F | Resp 16 | Ht 67.0 in | Wt 190.0 lb

## 2010-10-20 DIAGNOSIS — E039 Hypothyroidism, unspecified: Secondary | ICD-10-CM

## 2010-10-20 DIAGNOSIS — D500 Iron deficiency anemia secondary to blood loss (chronic): Secondary | ICD-10-CM

## 2010-10-20 DIAGNOSIS — I10 Essential (primary) hypertension: Secondary | ICD-10-CM

## 2010-10-20 DIAGNOSIS — N182 Chronic kidney disease, stage 2 (mild): Secondary | ICD-10-CM

## 2010-10-20 DIAGNOSIS — F3131 Bipolar disorder, current episode depressed, mild: Secondary | ICD-10-CM

## 2010-10-20 LAB — BASIC METABOLIC PANEL
BUN: 39 mg/dL — ABNORMAL HIGH (ref 6–23)
CO2: 28 mEq/L (ref 19–32)
Calcium: 9.3 mg/dL (ref 8.4–10.5)
Chloride: 105 mEq/L (ref 96–112)
Creatinine, Ser: 2.4 mg/dL — ABNORMAL HIGH (ref 0.4–1.5)
GFR: 29.09 mL/min — ABNORMAL LOW (ref >60.00)
Glucose, Bld: 72 mg/dL (ref 70–99)
Potassium: 5.6 mEq/L — ABNORMAL HIGH (ref 3.5–5.1)
Sodium: 142 mEq/L (ref 135–145)

## 2010-10-20 LAB — CBC WITH DIFFERENTIAL/PLATELET
Eosinophils Relative: 4.6 % (ref 0.0–5.0)
HCT: 42 % (ref 39.0–52.0)
Hemoglobin: 13.7 g/dL (ref 13.0–17.0)
Lymphs Abs: 1.5 10*3/uL (ref 0.7–4.0)
Monocytes Relative: 10.3 % (ref 3.0–12.0)
Neutro Abs: 4 10*3/uL (ref 1.4–7.7)
RBC: 4.48 Mil/uL (ref 4.22–5.81)
WBC: 6.5 10*3/uL (ref 4.5–10.5)

## 2010-10-20 LAB — TSH: TSH: 1.23 u[IU]/mL (ref 0.35–5.50)

## 2010-10-20 NOTE — Assessment & Plan Note (Signed)
Checking thyroid levels on Parnate   And  Abilify

## 2010-10-20 NOTE — Assessment & Plan Note (Signed)
Patient is on medication recent job loss at a distress

## 2010-10-20 NOTE — Assessment & Plan Note (Signed)
Monitoring her renal function from possible toxicity of lithium in the past A basic metabolic panel will be ordered today

## 2010-10-20 NOTE — Progress Notes (Signed)
Subjective:    Patient ID: [name], male    DOB: [birth year], 70 y.o.   [name]  HPI patient is a 70 year old male with a history of chronic renal insufficiency possible stage II to 3 renal disease initiated with a for lithium toxicity in the past.  He has been stable and had been on Lasix but discontinue the Lasix and do to some hypotension and urinary incontinence.  His blood pressure today is stable at 136/80 he denies any headaches shortness of breath chest pain PND or orthopnea.  He is also followed for a history of iron deficiency blood loss anemia possible side effects of fatigue or hypothyroidism from his bipolar medications.      Review of Systems  Constitutional: Positive for fatigue. Negative for fever.  HENT: Negative for hearing loss, congestion, neck pain and postnasal drip.   Eyes: Negative for discharge, redness and visual disturbance.  Respiratory: Negative for cough, shortness of breath and wheezing.   Cardiovascular: Negative for leg swelling.  Gastrointestinal: Negative for abdominal pain, constipation and abdominal distention.  Genitourinary: Negative for urgency and frequency.  Musculoskeletal: Negative for joint swelling and arthralgias.  Skin: Negative for color change and rash.  Neurological: Negative for weakness and light-headedness.  Hematological: Negative for adenopathy.  Psychiatric/Behavioral: Negative for behavioral problems.   Past Medical History  Diagnosis Date  . Bipolar 1 disorder   . Irritable bowel syndrome   . Diverticulosis   . Hypertension   . 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 Colonoscopy w/ polypectomy 09/07/2002    reports that he has never smoked. He does not have any smokeless tobacco history on file. He reports that he does not drink alcohol or use illicit drugs. family history includes Cancer in  his mother and Stroke in his father. No Known Allergies     Objective:   Physical Exam  Nursing note and vitals reviewed. Constitutional: He appears well-developed and well-nourished.  HENT:  Head: Normocephalic and atraumatic.  Eyes: Conjunctivae are normal. Pupils are equal, round, and reactive to light.  Neck: Normal range of motion. Neck supple.  Cardiovascular: Normal rate and regular rhythm.   Pulmonary/Chest: Effort normal and breath sounds normal.  Abdominal: Soft. Bowel sounds are normal.  Musculoskeletal: He exhibits tenderness.       Mild pain right knee  Skin: Skin is warm and dry.  Psychiatric: His behavior is normal. Thought content normal.     Past Medical History  Diagnosis Date  . Bipolar 1 disorder   . Irritable bowel syndrome   . Diverticulosis   . Hypertension   . 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 Colonoscopy w/ polypectomy 09/07/2002    reports that he has never smoked. He does not have any smokeless tobacco history on file. He reports that he does not drink alcohol or use illicit drugs. family history includes Cancer in his mother and Stroke in his father. No Known Allergies      Assessment & Plan:   CHRONIC KIDNEY DISEASE STAGE II (MILD) Monitoring her renal function from possible toxicity of lithium in the past A basic metabolic panel will be ordered today  HYPERTENSION No longer taking Lasix last creatinine was 2.2 the patient stopped taking Lasix due to issues with incontinence from stress  HYPOTHYROIDISM Checking thyroid levels on Parnate   And  Abilify  BPLR I, DEPRESSED, MOST RECENT EPSD, MILD Patient is on medication recent job loss at a distress  IRON DEFICIENCY ANEMIA SECONDARY TO BLOOD LOSS History of blood loss anemia will be monitoring CBC

## 2010-10-20 NOTE — Assessment & Plan Note (Signed)
History of blood loss anemia will be monitoring CBC

## 2010-10-20 NOTE — Assessment & Plan Note (Signed)
No longer taking Lasix last creatinine was 2.2 the patient stopped taking Lasix due to issues with incontinence from stress

## 2010-11-02 ENCOUNTER — Other Ambulatory Visit: Payer: Self-pay | Admitting: Internal Medicine

## 2010-11-04 ENCOUNTER — Other Ambulatory Visit: Payer: Self-pay | Admitting: Internal Medicine

## 2011-01-20 ENCOUNTER — Ambulatory Visit (INDEPENDENT_AMBULATORY_CARE_PROVIDER_SITE_OTHER): Payer: Medicare Other | Admitting: Internal Medicine

## 2011-01-20 ENCOUNTER — Encounter: Payer: Self-pay | Admitting: Internal Medicine

## 2011-01-20 VITALS — BP 140/90 | HR 80 | Temp 98.6°F | Resp 16 | Ht 66.0 in | Wt 194.0 lb

## 2011-01-20 DIAGNOSIS — E039 Hypothyroidism, unspecified: Secondary | ICD-10-CM

## 2011-01-20 DIAGNOSIS — I10 Essential (primary) hypertension: Secondary | ICD-10-CM

## 2011-01-20 DIAGNOSIS — Z23 Encounter for immunization: Secondary | ICD-10-CM

## 2011-01-20 DIAGNOSIS — G589 Mononeuropathy, unspecified: Secondary | ICD-10-CM

## 2011-01-20 DIAGNOSIS — E785 Hyperlipidemia, unspecified: Secondary | ICD-10-CM

## 2011-01-20 DIAGNOSIS — N529 Male erectile dysfunction, unspecified: Secondary | ICD-10-CM

## 2011-01-20 DIAGNOSIS — N182 Chronic kidney disease, stage 2 (mild): Secondary | ICD-10-CM

## 2011-01-20 DIAGNOSIS — Z79899 Other long term (current) drug therapy: Secondary | ICD-10-CM

## 2011-01-20 DIAGNOSIS — G629 Polyneuropathy, unspecified: Secondary | ICD-10-CM | POA: Insufficient documentation

## 2011-01-20 DIAGNOSIS — D500 Iron deficiency anemia secondary to blood loss (chronic): Secondary | ICD-10-CM

## 2011-01-20 LAB — BASIC METABOLIC PANEL
BUN: 37 mg/dL — ABNORMAL HIGH (ref 6–23)
Chloride: 107 mEq/L (ref 96–112)
Potassium: 4.7 mEq/L (ref 3.5–5.1)

## 2011-01-20 LAB — CBC WITH DIFFERENTIAL/PLATELET
Basophils Relative: 0.5 % (ref 0.0–3.0)
Eosinophils Relative: 4.6 % (ref 0.0–5.0)
Hemoglobin: 14.2 g/dL (ref 13.0–17.0)
Lymphocytes Relative: 21.4 % (ref 12.0–46.0)
MCHC: 33.5 g/dL (ref 30.0–36.0)
Monocytes Relative: 8.5 % (ref 3.0–12.0)
Neutro Abs: 4 10*3/uL (ref 1.4–7.7)
RBC: 4.51 Mil/uL (ref 4.22–5.81)
WBC: 6.1 10*3/uL (ref 4.5–10.5)

## 2011-01-20 LAB — HEPATIC FUNCTION PANEL
ALT: 14 U/L (ref 0–53)
AST: 18 U/L (ref 0–37)
Albumin: 4 g/dL (ref 3.5–5.2)
Total Bilirubin: 0.7 mg/dL (ref 0.3–1.2)

## 2011-01-20 LAB — LIPID PANEL
Cholesterol: 137 mg/dL (ref 0–200)
LDL Cholesterol: 70 mg/dL (ref 0–99)

## 2011-01-20 LAB — TSH: TSH: 1.07 u[IU]/mL (ref 0.35–5.50)

## 2011-01-20 MED ORDER — CYANOCOBALAMIN 1000 MCG/ML IJ SOLN
1000.0000 ug | INTRAMUSCULAR | Status: DC
  Administered 2011-01-20: 1000 ug via INTRAMUSCULAR

## 2011-01-20 MED ORDER — SILDENAFIL CITRATE 100 MG PO TABS: 100.0000 mg | ORAL_TABLET | ORAL | Status: DC | PRN

## 2011-01-20 NOTE — Progress Notes (Signed)
  Subjective:    Patient ID: [name], male    DOB: 10-08-1940, 70 y.o.   MRN: [id number]  HPI her old white male who is followed for bipolar disorder a history of renal insufficiency hypertension iron deficiency anemia and a new complaint of peripheral neuropathy.  Peripheral neuropathy may be secondary to medication long-term use of antidepressants and neuroleptics he has numbness and tingling in his great toe the differential diagnosis can include gout however physical examination there is no erythema or tenderness full range of motion.      Review of Systems  Constitutional: Negative for fever and fatigue.  HENT: Negative for hearing loss, congestion, neck pain and postnasal drip.   Eyes: Negative for discharge, redness and visual disturbance.  Respiratory: Negative for cough, shortness of breath and wheezing.   Cardiovascular: Negative for leg swelling.  Gastrointestinal: Negative for abdominal pain, constipation and abdominal distention.  Genitourinary: Negative for urgency and frequency.  Musculoskeletal: Negative for joint swelling and arthralgias.  Skin: Negative for color change and rash.  Neurological: Negative for weakness and light-headedness.  Hematological: Negative for adenopathy.  Psychiatric/Behavioral: Negative for behavioral problems.   Past Medical History  Diagnosis Date  . Bipolar 1 disorder   . Irritable bowel syndrome   . Diverticulosis   . Hypertension   . 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 Colonoscopy w/ polypectomy 09/07/2002    reports that he has never smoked. He does not have any smokeless tobacco history on file. He reports that he does not drink alcohol or use illicit drugs. family history includes Cancer in his mother and Stroke in his father. No Known Allergies      Objective:   Physical Exam  Nursing note and vitals  reviewed. Constitutional: He appears well-developed and well-nourished.  HENT:  Head: Normocephalic and atraumatic.  Eyes: Conjunctivae are normal. Pupils are equal, round, and reactive to light.  Neck: Normal range of motion. Neck supple.  Cardiovascular: Normal rate and regular rhythm.   Pulmonary/Chest: Effort normal and breath sounds normal.  Abdominal: Soft. Bowel sounds are normal.  Musculoskeletal: He exhibits no edema and no tenderness.  Skin: Skin is warm and dry.          Assessment & Plan:  Patient receive a flu shot today and a B12 injection for potential peripheral neuropathy with monitoring of its effect.  We'll obtain appropriate lab eyes including a B12 iron CBC and TSH.  We'll monitor his iron deficiency anemia.  We will monitor his chronic renal insufficiency his blood pressure is stable today his current medications will followup in 3 months A prescription for Viagra do 2 rectal dysfunction was given to the patient

## 2011-01-20 NOTE — Patient Instructions (Addendum)
Patient was instructed to continue all medications as prescribed. To stop at the checkout desk and schedule a followup appointment  

## 2011-01-20 NOTE — Progress Notes (Signed)
Addended by: Allyne Gee on: 01/20/2011 10:41 AM   Modules accepted: Orders

## 2011-01-25 ENCOUNTER — Telehealth: Payer: Self-pay | Admitting: Internal Medicine

## 2011-01-25 NOTE — Telephone Encounter (Signed)
Talked with [name] and pt had stopped by [facility] and we need an order , sleep study and ov copy faxed to [facility]

## 2011-01-25 NOTE — Telephone Encounter (Signed)
Pt came by office and is req to get an RX for new cpap machine with setting for variable pressure (NOT 18-this pressure is too high). Pts old machine broke and is not working. Pls send rx to [place] on [address]. Pt did not have the fax # for [facility].

## 2011-01-25 NOTE — Telephone Encounter (Signed)
Per betsy- need script with setting, ov and sleep study- faxed to [facility]-done

## 2011-01-27 ENCOUNTER — Telehealth: Payer: Self-pay | Admitting: Internal Medicine

## 2011-01-27 NOTE — Telephone Encounter (Signed)
Pt came by office and said that he contacted [facility] and was told that they have not rcvd a script for new cpap machine. Pt needs to make sure that this is a variable setting for between 4 and 12. Pls send to [facility] on [address]. (pt does not have fax # for [facility] on [address]).

## 2011-01-27 NOTE — Telephone Encounter (Signed)
This was all [facility]ed to [facility] on 11-6 and betsy gave confirmation it was received- pt informed - but cannot put variable setting- per dr Arnoldo Morale- had to order 16 setting-pt informed

## 2011-02-01 ENCOUNTER — Telehealth: Payer: Self-pay | Admitting: Internal Medicine

## 2011-02-01 NOTE — Telephone Encounter (Signed)
Pt informed all wnl except metabolic panel is stable

## 2011-02-01 NOTE — Telephone Encounter (Signed)
Pt need bloodwork results °

## 2011-04-22 ENCOUNTER — Ambulatory Visit: Payer: Medicare Other | Admitting: Internal Medicine

## 2011-04-28 ENCOUNTER — Other Ambulatory Visit: Payer: Self-pay | Admitting: Internal Medicine

## 2011-05-19 ENCOUNTER — Encounter: Payer: Self-pay | Admitting: Internal Medicine

## 2011-05-19 ENCOUNTER — Ambulatory Visit (INDEPENDENT_AMBULATORY_CARE_PROVIDER_SITE_OTHER): Payer: Medicare Other | Admitting: Internal Medicine

## 2011-05-19 VITALS — BP 146/84 | HR 72 | Temp 98.1°F | Resp 16 | Ht 66.0 in | Wt 198.0 lb

## 2011-05-19 DIAGNOSIS — N139 Obstructive and reflux uropathy, unspecified: Secondary | ICD-10-CM

## 2011-05-19 DIAGNOSIS — N182 Chronic kidney disease, stage 2 (mild): Secondary | ICD-10-CM

## 2011-05-19 DIAGNOSIS — T887XXA Unspecified adverse effect of drug or medicament, initial encounter: Secondary | ICD-10-CM

## 2011-05-19 DIAGNOSIS — I10 Essential (primary) hypertension: Secondary | ICD-10-CM

## 2011-05-19 DIAGNOSIS — Z79899 Other long term (current) drug therapy: Secondary | ICD-10-CM

## 2011-05-19 DIAGNOSIS — N138 Other obstructive and reflux uropathy: Secondary | ICD-10-CM

## 2011-05-19 DIAGNOSIS — K589 Irritable bowel syndrome without diarrhea: Secondary | ICD-10-CM

## 2011-05-19 DIAGNOSIS — N401 Enlarged prostate with lower urinary tract symptoms: Secondary | ICD-10-CM

## 2011-05-19 LAB — BASIC METABOLIC PANEL
Chloride: 108 mEq/L (ref 96–112)
GFR: 37.76 mL/min — ABNORMAL LOW (ref >60.00)
Glucose, Bld: 84 mg/dL (ref 70–99)
Potassium: 5.3 mEq/L — ABNORMAL HIGH (ref 3.5–5.1)
Sodium: 142 mEq/L (ref 135–145)

## 2011-05-19 LAB — POCT URINALYSIS DIPSTICK
Glucose, UA: NEGATIVE
Ketones, UA: NEGATIVE
Spec Grav, UA: 1.01

## 2011-05-19 LAB — HEPATIC FUNCTION PANEL
Bilirubin, Direct: 0.1 mg/dL (ref 0.0–0.3)
Total Bilirubin: 0.5 mg/dL (ref 0.3–1.2)

## 2011-05-19 LAB — PSA: PSA: 0.86 ng/mL (ref 0.10–4.00)

## 2011-05-19 MED ORDER — SILODOSIN 4 MG PO CAPS: 8.0000 mg | ORAL_CAPSULE | Freq: Every day | ORAL | Status: DC

## 2011-05-19 NOTE — Patient Instructions (Addendum)
Follow the DASH diet  For the gas take "align" a probiotic Help to balance out her intestinal track so doesn't produce a much gas   I'm going to give you a trial of a medication to see if we can help you with your frequent urination take one pill a day and report back to me in about 2-3 weeks it was not effective

## 2011-05-19 NOTE — Progress Notes (Signed)
Subjective:    Patient ID: [name], male    DOB: [birth year], 71 y.o.   [name]  HPI  The patient has 2 specific complaints today in addition to following up for his medications and for hypertension.  He also has a history of chronic renal insufficiency that we continue to monitor and a history of anemia.  His chief complaints are a sense of increased gas he states that no matter what he eats he tends to have a lot of abdominal gas and flatus.  The second complaint is one of frequency when he drinks he seems to immediately need to go to the restroom.  He denies any dysuria any back pain any symptoms of burning or testicular pain that would be associated with  prostatitis  Review of Systems  Constitutional: Negative for fever and fatigue.  HENT: Negative for hearing loss, congestion, neck pain and postnasal drip.   Eyes: Negative for discharge, redness and visual disturbance.  Respiratory: Negative for cough, shortness of breath and wheezing.   Cardiovascular: Negative for leg swelling.  Gastrointestinal: Negative for abdominal pain, constipation and abdominal distention.  Genitourinary: Negative for urgency and frequency.  Musculoskeletal: Negative for joint swelling and arthralgias.  Skin: Negative for color change and rash.  Neurological: Negative for weakness and light-headedness.  Hematological: Negative for adenopathy.  Psychiatric/Behavioral: Negative for behavioral problems.   Past Medical History  Diagnosis Date  . Bipolar 1 disorder   . Irritable bowel syndrome   . Diverticulosis   . Hypertension   . 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 Marital Status: Divorced    Spouse Name: N/A    Number of Children: N/A  . Years of Education: N/A   Occupational History  . Not on file.   Social History Main Topics  . Smoking status: Never Smoker   .  Smokeless tobacco: Not on file  . Alcohol Use: No  . Drug Use: No  . Sexually Active: Not on file   Other Topics Concern  . Not on file   Social History Narrative  . No narrative on file    Past Surgical History  Procedure Date  . Colonoscopy w/ polypectomy 09/07/2002    Family History  Problem Relation Age of Onset  . Cancer Mother   . Stroke Father     No Known Allergies  Current Outpatient Prescriptions on File Prior to Visit  Medication Sig Dispense Refill  . ARIPiprazole (ABILIFY) 5 MG tablet Take 5 mg by mouth daily.        . bisacodyl (DULCOLAX) 5 MG EC tablet Take 5 mg by mouth daily as needed.        . Calcium-Vitamin D (CALTRATE 600 PLUS-VIT D PO) Take by mouth 2 (two) times daily.        Marland Kitchen tranylcypromine (PARNATE) 10 MG tablet Take 10 mg by mouth. 2 in am and 3 at bedtime       . Vitamin D, Ergocalciferol, (DRISDOL) 50000 UNITS CAPS TAKE 1 CAPSULE BY MOUTH EVERY WEEK AS DIRECTED  5 capsule  1   Current Facility-Administered Medications on File Prior to Visit  Medication Dose Route Frequency Provider Last Rate Last Dose  . cyanocobalamin ((VITAMIN B-12)) injection 1,000 mcg  1,000 mcg Intramuscular Q30 days Georgetta Haber, MD   1,000 mcg at 01/20/11 1039    BP 146/84  Pulse 72  Temp 98.1 F (36.7 C)  Resp 16  Ht 5\' 6"  (1.676 m)  Wt 198 lb (89.812 kg)  BMI 31.96 kg/m2       Objective:   Physical Exam  Nursing note and vitals reviewed. Constitutional: He appears well-developed and well-nourished.  HENT:  Head: Normocephalic and atraumatic.  Eyes: Conjunctivae are normal. Pupils are equal, round, and reactive to light.  Neck: Normal range of motion. Neck supple.  Cardiovascular: Normal rate and regular rhythm.   Pulmonary/Chest: Effort normal and breath sounds normal.  Abdominal: Soft. Bowel sounds are normal.          Assessment & Plan:   For the prostatic symptoms we will give him a trial of rapaflow and see how that works We will  check his PSA today We'll also monitor his renal function today His blood pressure appears stable off medication

## 2011-05-20 ENCOUNTER — Encounter: Payer: Self-pay | Admitting: Internal Medicine

## 2011-05-24 NOTE — Progress Notes (Signed)
Pt informed

## 2011-06-04 ENCOUNTER — Telehealth: Payer: Self-pay | Admitting: Internal Medicine

## 2011-06-04 MED ORDER — SILODOSIN 4 MG PO CAPS: 8.0000 mg | ORAL_CAPSULE | Freq: Every day | ORAL | Status: DC

## 2011-06-04 NOTE — Telephone Encounter (Signed)
Sent to pharmacy 

## 2011-06-04 NOTE — Telephone Encounter (Signed)
Pt was given samples of Rapaflo. Pt is requesting additional samples or an rx sent to [facility]

## 2011-06-04 NOTE — Telephone Encounter (Signed)
Pt is call to report rapaflo and align is working fair

## 2011-06-16 ENCOUNTER — Other Ambulatory Visit: Payer: Self-pay | Admitting: Internal Medicine

## 2011-09-14 ENCOUNTER — Other Ambulatory Visit: Payer: Self-pay | Admitting: *Deleted

## 2011-09-14 ENCOUNTER — Ambulatory Visit (INDEPENDENT_AMBULATORY_CARE_PROVIDER_SITE_OTHER): Payer: Medicare Other | Admitting: Internal Medicine

## 2011-09-14 ENCOUNTER — Encounter: Payer: Self-pay | Admitting: Internal Medicine

## 2011-09-14 VITALS — BP 124/90 | HR 76 | Temp 98.6°F | Resp 16 | Ht 66.0 in | Wt 188.0 lb

## 2011-09-14 DIAGNOSIS — G629 Polyneuropathy, unspecified: Secondary | ICD-10-CM

## 2011-09-14 DIAGNOSIS — F319 Bipolar disorder, unspecified: Secondary | ICD-10-CM

## 2011-09-14 DIAGNOSIS — G589 Mononeuropathy, unspecified: Secondary | ICD-10-CM

## 2011-09-14 DIAGNOSIS — E559 Vitamin D deficiency, unspecified: Secondary | ICD-10-CM

## 2011-09-14 DIAGNOSIS — E039 Hypothyroidism, unspecified: Secondary | ICD-10-CM

## 2011-09-14 DIAGNOSIS — I10 Essential (primary) hypertension: Secondary | ICD-10-CM

## 2011-09-14 DIAGNOSIS — F3130 Bipolar disorder, current episode depressed, mild or moderate severity, unspecified: Secondary | ICD-10-CM

## 2011-09-14 DIAGNOSIS — D500 Iron deficiency anemia secondary to blood loss (chronic): Secondary | ICD-10-CM

## 2011-09-14 LAB — CBC WITH DIFFERENTIAL/PLATELET
Basophils Relative: 0.6 % (ref 0.0–3.0)
Eosinophils Relative: 1.9 % (ref 0.0–5.0)
HCT: 40.4 % (ref 39.0–52.0)
MCV: 93.3 fl (ref 78.0–100.0)
Monocytes Absolute: 0.6 10*3/uL (ref 0.1–1.0)
Monocytes Relative: 8.9 % (ref 3.0–12.0)
Neutrophils Relative %: 73.8 % (ref 43.0–77.0)
RBC: 4.33 Mil/uL (ref 4.22–5.81)
WBC: 6.9 10*3/uL (ref 4.5–10.5)

## 2011-09-14 LAB — BASIC METABOLIC PANEL
Chloride: 106 mEq/L (ref 96–112)
Creatinine, Ser: 1.9 mg/dL — ABNORMAL HIGH (ref 0.4–1.5)
GFR: 36.38 mL/min — ABNORMAL LOW (ref >60.00)
Potassium: 4.5 mEq/L (ref 3.5–5.1)

## 2011-09-14 LAB — IRON: Iron: 68 ug/dL (ref 42–165)

## 2011-09-14 MED ORDER — ARIPIPRAZOLE 5 MG PO TABS: 5.0000 mg | ORAL_TABLET | Freq: Every day | ORAL | Status: DC

## 2011-09-14 NOTE — Patient Instructions (Addendum)
The patient is instructed to continue all medications as prescribed. Schedule followup with check out clerk upon leaving the clinic  

## 2011-09-14 NOTE — Progress Notes (Signed)
Subjective:    Patient ID: [name], male    DOB: July 09, 1940, 72 y.o.   MRN: [id number]  HPI Patient presents for monitoring of blood pressure history of chronic renal insufficiency.  And monitoring of history of anemia and hypothyroidism.  He is doing quite well he has lost 10 pounds his blood pressure stable his current medication he has no chest pain shortness of breath.   Review of Systems  Constitutional: Negative for fever and fatigue.  HENT: Negative for hearing loss, congestion, neck pain and postnasal drip.   Eyes: Negative for discharge, redness and visual disturbance.  Respiratory: Negative for cough, shortness of breath and wheezing.   Cardiovascular: Negative for leg swelling.  Gastrointestinal: Negative for abdominal pain, constipation and abdominal distention.  Genitourinary: Negative for urgency and frequency.  Musculoskeletal: Negative for joint swelling and arthralgias.  Skin: Negative for color change and rash.  Neurological: Negative for weakness and light-headedness.  Hematological: Negative for adenopathy.  Psychiatric/Behavioral: Negative for behavioral problems.   Past Medical History  Diagnosis Date  . Bipolar 1 disorder   . Irritable bowel syndrome   . Diverticulosis   . Hypertension   . 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 Marital Status: Divorced    Spouse Name: N/A    Number of Children: N/A  . Years of Education: N/A   Occupational History  . Not on file.   Social History Main Topics  . Smoking status: Never Smoker   . Smokeless tobacco: Not on file  . Alcohol Use: No  . Drug Use: No  . Sexually Active: Not on file   Other Topics Concern  . Not on file   Social History Narrative  . No narrative on file    Past Surgical History  Procedure Date  . Colonoscopy w/ polypectomy 09/07/2002    Family History  Problem  Relation Age of Onset  . Cancer Mother   . Stroke Father     No Known Allergies  Current Outpatient Prescriptions on File Prior to Visit  Medication Sig Dispense Refill  . ARIPiprazole (ABILIFY) 5 MG tablet Take 5 mg by mouth daily.        . Calcium-Vitamin D (CALTRATE 600 PLUS-VIT D PO) Take by mouth 2 (two) times daily.        Marland Kitchen tranylcypromine (PARNATE) 10 MG tablet Take 10 mg by mouth. 2 in am and 3 at bedtime       . Vitamin D, Ergocalciferol, (DRISDOL) 50000 UNITS CAPS TAKE 1 CAPSULE BY MOUTH EVERY WEEK AS DIRECTED  5 capsule  1   Current Facility-Administered Medications on File Prior to Visit  Medication Dose Route Frequency Provider Last Rate Last Dose  . cyanocobalamin ((VITAMIN B-12)) injection 1,000 mcg  1,000 mcg Intramuscular Q30 days Ricard Dillon, MD   1,000 mcg at 01/20/11 1039    BP 124/90  Pulse 76  Temp 98.6 F (37 C)  Resp 16  Ht 5\' 6"  (1.676 m)  Wt 188 lb (85.276 kg)  BMI 30.34 kg/m2       Objective:   Physical Exam  Nursing note and vitals reviewed. Constitutional: He appears well-developed and well-nourished.  HENT:  Head: Normocephalic and atraumatic.  Eyes: Conjunctivae are normal. Pupils are equal, round, and reactive to light.  Neck: Normal range of motion. Neck supple.  Cardiovascular: Normal rate and regular rhythm.   Pulmonary/Chest:  Effort normal and breath sounds normal.  Abdominal: Soft. Bowel sounds are normal.          Assessment & Plan:  Patient's blood pressure stable on current medications we'll monitor CBC and iron level as well as a TSH today Neuropathy is stable monitoring for renal disease today creatinine was 1.9 last check

## 2011-09-15 LAB — VITAMIN D 25 HYDROXY (VIT D DEFICIENCY, FRACTURES): Vit D, 25-Hydroxy: 66 ng/mL (ref 30–89)

## 2011-09-28 ENCOUNTER — Other Ambulatory Visit: Payer: Self-pay | Admitting: Internal Medicine

## 2011-10-13 ENCOUNTER — Telehealth: Payer: Self-pay | Admitting: Internal Medicine

## 2011-10-13 NOTE — Telephone Encounter (Signed)
Ov for tomorrow with padonda

## 2011-10-13 NOTE — Telephone Encounter (Signed)
Pt called and was transferred to [facility] re: bi-lateral leg edema.  Pt called back because [facility] was not avail and is req work in ov with Dr Arnoldo Morale asap. Pt refuses to see another doctor. Req call back from [facility] asap.

## 2011-10-14 ENCOUNTER — Ambulatory Visit (INDEPENDENT_AMBULATORY_CARE_PROVIDER_SITE_OTHER): Payer: Medicare Other | Admitting: Family

## 2011-10-14 ENCOUNTER — Encounter: Payer: Self-pay | Admitting: Family

## 2011-10-14 VITALS — BP 132/94 | HR 103 | Temp 98.5°F | Wt 191.0 lb

## 2011-10-14 DIAGNOSIS — R609 Edema, unspecified: Secondary | ICD-10-CM

## 2011-10-14 LAB — BASIC METABOLIC PANEL
Calcium: 9.2 mg/dL (ref 8.4–10.5)
GFR: 33.56 mL/min — ABNORMAL LOW (ref >60.00)
Sodium: 137 mEq/L (ref 135–145)

## 2011-10-14 LAB — POCT URINALYSIS DIPSTICK
Glucose, UA: NEGATIVE
Nitrite, UA: NEGATIVE
Spec Grav, UA: 1.01
Urobilinogen, UA: 0.2

## 2011-10-14 LAB — CBC WITH DIFFERENTIAL/PLATELET
Basophils Absolute: 0 10*3/uL (ref 0.0–0.1)
Basophils Relative: 0.4 % (ref 0.0–3.0)
Hemoglobin: 12.7 g/dL — ABNORMAL LOW (ref 13.0–17.0)
Lymphocytes Relative: 12 % (ref 12.0–46.0)
Monocytes Relative: 6.6 % (ref 3.0–12.0)
Neutro Abs: 6.3 10*3/uL (ref 1.4–7.7)
RBC: 4.02 Mil/uL — ABNORMAL LOW (ref 4.22–5.81)
RDW: 13.3 % (ref 11.5–14.6)
WBC: 8 10*3/uL (ref 4.5–10.5)

## 2011-10-14 MED ORDER — FUROSEMIDE 20 MG PO TABS: 20.0000 mg | ORAL_TABLET | Freq: Every day | ORAL | Status: DC

## 2011-10-14 NOTE — Patient Instructions (Addendum)
Peripheral Edema You have swelling in your legs (peripheral edema). This swelling is due to excess accumulation of salt and water in your body. Edema may be a sign of heart, kidney or liver disease, or a side effect of a medication. It may also be due to problems in the leg veins. Elevating your legs and using special support stockings may be very helpful, if the cause of the swelling is due to poor venous circulation. Avoid long periods of standing, whatever the cause. Treatment of edema depends on identifying the cause. Chips, pretzels, pickles and other salty foods should be avoided. Restricting salt in your diet is almost always needed. Water pills (diuretics) are often used to remove the excess salt and water from your body via urine. These medicines prevent the kidney from reabsorbing sodium. This increases urine flow. Diuretic treatment may also result in lowering of potassium levels in your body. Potassium supplements may be needed if you have to use diuretics daily. Daily weights can help you keep track of your progress in clearing your edema. You should call your caregiver for follow up care as recommended. SEEK IMMEDIATE MEDICAL CARE IF:   You have increased swelling, pain, redness, or heat in your legs.   You develop shortness of breath, especially when lying down.   You develop chest or abdominal pain, weakness, or fainting.   You have a fever.  Document Released: 04/15/2004 Document Revised: 02/25/2011 Document Reviewed: 03/26/2009 [facility] Patient Information 2012 [place], [place].   1.5 Gram Low Sodium Diet A 1.5 gram sodium diet restricts the amount of sodium in the diet to no more than 1.5 g or 1500 mg daily. The American Heart Association recommends Americans over the age of 90 to consume no more than 1500 mg of sodium each day to reduce the risk of developing high blood pressure. Research also shows that limiting sodium may reduce heart attack and stroke risk. Many foods contain  sodium for flavor and sometimes as a preservative. When the amount of sodium in a diet needs to be low, it is important to know what to look for when choosing foods and drinks. The following includes some information and guidelines to help make it easier for you to adapt to a low sodium diet. QUICK TIPS  Do not add salt to food.   Avoid convenience items and fast food.   Choose unsalted snack foods.   Buy lower sodium products, often labeled as "lower sodium" or "no salt added."   Check food labels to learn how much sodium is in 1 serving.   When eating at a restaurant, ask that your food be prepared with less salt or none, if possible.  READING FOOD LABELS FOR SODIUM INFORMATION The nutrition facts label is a good place to find how much sodium is in foods. Look for products with no more than 400 mg of sodium per serving. Remember that 1.5 g = 1500 mg. The food label may also list foods as:  Sodium-free: Less than 5 mg in a serving.   Very low sodium: 35 mg or less in a serving.   Low-sodium: 140 mg or less in a serving.   Light in sodium: 50% less sodium in a serving. For example, if a food that usually has 300 mg of sodium is changed to become light in sodium, it will have 150 mg of sodium.   Reduced sodium: 25% less sodium in a serving. For example, if a food that usually has 400 mg of sodium  is changed to reduced sodium, it will have 300 mg of sodium.  CHOOSING FOODS Grains  Avoid: Salted crackers and snack items. Some cereals, including instant hot cereals. Bread stuffing and biscuit mixes. Seasoned rice or pasta mixes.   Choose: Unsalted snack items. Low-sodium cereals, oats, puffed wheat and rice, shredded wheat. English muffins and bread. Pasta.  Meats  Avoid: Salted, canned, smoked, spiced, pickled meats, including fish and poultry. Bacon, ham, sausage, cold cuts, hot dogs, anchovies.   Choose: Low-sodium canned tuna and salmon. Fresh or frozen meat, poultry, and fish.    Dairy  Avoid: Processed cheese and spreads. Cottage cheese. Buttermilk and condensed milk. Regular cheese.   Choose: Milk. Low-sodium cottage cheese. Yogurt. Sour cream. Low-sodium cheese.  Fruits and Vegetables  Avoid: Regular canned vegetables. Regular canned tomato sauce and paste. Frozen vegetables in sauces. Olives. Angie Fava. Relishes. Sauerkraut.   Choose: Low-sodium canned vegetables. Low-sodium tomato sauce and paste. Frozen or fresh vegetables. Fresh and frozen fruit.  Condiments  Avoid: Canned and packaged gravies. Worcestershire sauce. Tartar sauce. Barbecue sauce. Soy sauce. Steak sauce. Ketchup. Onion, garlic, and table salt. Meat flavorings and tenderizers.   Choose: Fresh and dried herbs and spices. Low-sodium varieties of mustard and ketchup. Lemon juice. Tabasco sauce. Horseradish.  SAMPLE 1.5 GRAM SODIUM MEAL PLAN Breakfast / Sodium (mg)  1 cup low-fat milk / 143 mg   1 whole-wheat English muffin / 240 mg   1 tbs heart-healthy margarine / 153 mg   1 hard-boiled egg / 139 mg   1 small orange / 0 mg  Lunch / Sodium (mg)  1 cup raw carrots / 76 mg   2 tbs no salt added peanut butter / 5 mg   2 slices whole-wheat bread / 270 mg   1 tbs jelly / 6 mg    cup red grapes / 2 mg  Dinner / Sodium (mg)  1 cup whole-wheat pasta / 2 mg   1 cup low-sodium tomato sauce / 73 mg   3 oz lean ground beef / 57 mg   1 small side salad (1 cup raw spinach leaves,  cup cucumber,  cup yellow bell pepper) with 1 tsp olive oil and 1 tsp red wine vinegar / 25 mg  Snack / Sodium (mg)  1 container low-fat vanilla yogurt / 107 mg   3 graham cracker squares / 127 mg  Nutrient Analysis  Calories: 1745   Protein: 75 g   Carbohydrate: 237 g   Fat: 57 g   Sodium: 1425 mg  Document Released: 03/08/2005 Document Revised: 02/25/2011 Document Reviewed: 06/09/2009 [facility] Patient Information 2012 [place], [place].

## 2011-10-14 NOTE — Progress Notes (Signed)
Subjective:    Patient ID: [name], male    DOB: 1940-11-19, 71 y.o.   [name]  HPI 71 year old nonsmoker, patient of Dr. Arnoldo Morale is in today with bilateral leg swelling that's been going on for about 5 months. He recently saw another physician in sodium and have this evaluated his concerns of possible heart failure. Patient has minimal pain in his legs. Overall has no difficulty walking, but occasionally his first few steps cause him to feel off balance. Reports a low-sodium diet. Eats out about 5 meals a week. He has been on Lasix in the past for his edema but does not like it because he cannot control his urinary frequency. He denies any lightheadedness, dizziness, shortness of breath, chest pain, palpitations.   Review of Systems  Constitutional: Negative.   HENT: Negative.   Respiratory: Negative.   Cardiovascular: Positive for leg swelling. Negative for chest pain and palpitations.  Gastrointestinal: Negative.   Genitourinary: Negative.   Musculoskeletal: Negative.   Skin: Negative.   Neurological: Negative.   Hematological: Negative.   Psychiatric/Behavioral: Negative.    Past Medical History  Diagnosis Date  . Bipolar 1 disorder   . Irritable bowel syndrome   . Diverticulosis   . Hypertension   . 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 Marital Status: Divorced    Spouse Name: N/A    Number of Children: N/A  . Years of Education: N/A   Occupational History  . Not on file.   Social History Main Topics  . Smoking status: Never Smoker   . Smokeless tobacco: Not on file  . Alcohol Use: No  . Drug Use: No  . Sexually Active: Not on file   Other Topics Concern  . Not on file   Social History Narrative  . No narrative on file    Past Surgical History  Procedure Date  . Colonoscopy w/ polypectomy 09/07/2002    Family History  Problem Relation  Age of Onset  . Cancer Mother   . Stroke Father     No Known Allergies  Current Outpatient Prescriptions on File Prior to Visit  Medication Sig Dispense Refill  . ARIPiprazole (ABILIFY) 5 MG tablet Take 1 tablet (5 mg total) by mouth daily.  90 tablet  3  . Calcium-Vitamin D (CALTRATE 600 PLUS-VIT D PO) Take by mouth 2 (two) times daily.        Marland Kitchen tranylcypromine (PARNATE) 10 MG tablet Take 10 mg by mouth. 2 in am and 3 at bedtime       . Vitamin D, Ergocalciferol, (DRISDOL) 50000 UNITS CAPS TAKE 1 CAPSULE BY MOUTH EVERY WEEK AS DIRECTED  5 capsule  1  . furosemide (LASIX) 20 MG tablet Take 1 tablet (20 mg total) by mouth daily.  30 tablet  3   Current Facility-Administered Medications on File Prior to Visit  Medication Dose Route Frequency Provider Last Rate Last Dose  . cyanocobalamin ((VITAMIN B-12)) injection 1,000 mcg  1,000 mcg Intramuscular Q30 days Ricard Dillon, MD   1,000 mcg at 01/20/11 1039    BP 132/94  Pulse 103  Temp 98.5 F (36.9 C) (Oral)  Wt 191 lb (86.637 kg)  SpO2 95%chart    Objective:   Physical Exam  Constitutional: He is oriented to person, place, and time. He appears well-developed and well-nourished.  Neck: Normal range of motion. Neck supple.  Cardiovascular: Normal rate, regular rhythm and normal heart sounds.   Pulmonary/Chest: Effort normal and breath sounds normal.  Abdominal: Soft. Bowel sounds are normal.  Musculoskeletal: He exhibits edema.       Bilateral 2+ pitting edema  Neurological: He is alert and oriented to person, place, and time.  Skin: Skin is warm and dry.  Psychiatric: He has a normal mood and affect.          Assessment & Plan:  Assessment: Peripheral edema likely related to sodium retention  Plan: Lasix 20 mg once daily. Lab sent to include BMP BNP, CBC, TSH will notify patient pending results. Elevate the legs. Low-sodium diet. Patient will call the office if symptoms worsen or persist. Recheck him in the results of his  labs, as scheduled, and sooner when necessary.

## 2011-10-15 ENCOUNTER — Other Ambulatory Visit: Payer: Self-pay | Admitting: Family

## 2011-10-15 DIAGNOSIS — R600 Localized edema: Secondary | ICD-10-CM

## 2011-10-15 DIAGNOSIS — R609 Edema, unspecified: Secondary | ICD-10-CM

## 2011-10-18 ENCOUNTER — Other Ambulatory Visit: Payer: Self-pay | Admitting: Internal Medicine

## 2011-10-18 DIAGNOSIS — N289 Disorder of kidney and ureter, unspecified: Secondary | ICD-10-CM

## 2012-01-13 ENCOUNTER — Encounter: Payer: Self-pay | Admitting: Internal Medicine

## 2012-01-13 ENCOUNTER — Ambulatory Visit (INDEPENDENT_AMBULATORY_CARE_PROVIDER_SITE_OTHER): Payer: Medicare Other | Admitting: Internal Medicine

## 2012-01-13 VITALS — BP 160/90 | HR 84 | Temp 98.6°F | Resp 18 | Ht 66.5 in | Wt 195.0 lb

## 2012-01-13 DIAGNOSIS — G629 Polyneuropathy, unspecified: Secondary | ICD-10-CM

## 2012-01-13 DIAGNOSIS — Z23 Encounter for immunization: Secondary | ICD-10-CM

## 2012-01-13 DIAGNOSIS — M810 Age-related osteoporosis without current pathological fracture: Secondary | ICD-10-CM

## 2012-01-13 DIAGNOSIS — G589 Mononeuropathy, unspecified: Secondary | ICD-10-CM

## 2012-01-13 DIAGNOSIS — I10 Essential (primary) hypertension: Secondary | ICD-10-CM

## 2012-01-13 DIAGNOSIS — E039 Hypothyroidism, unspecified: Secondary | ICD-10-CM

## 2012-01-13 DIAGNOSIS — N182 Chronic kidney disease, stage 2 (mild): Secondary | ICD-10-CM

## 2012-01-13 LAB — BASIC METABOLIC PANEL
GFR: 37.69 mL/min — ABNORMAL LOW (ref >60.00)
Glucose, Bld: 81 mg/dL (ref 70–99)
Potassium: 4.4 mEq/L (ref 3.5–5.1)
Sodium: 140 mEq/L (ref 135–145)

## 2012-01-13 LAB — TSH: TSH: 0.84 u[IU]/mL (ref 0.35–5.50)

## 2012-01-13 NOTE — Progress Notes (Signed)
Subjective:    Patient ID: [name], male    DOB: 10-09-1940, 71 y.o.   [name]  HPI Has pain in his left foot he has been to orthopedist for evaluation there was no evident fracture on the initial evaluation and he was placed in an immobilization cast due to the pain.  He will return to the orthopedist today for evaluation we have discussed if it is a stress fracture we should do bone density due to age and the fact that he's been on multiple medications that might influence calcium metabolism   Review of Systems  Constitutional: Negative for fever and fatigue.  HENT: Negative for hearing loss, congestion, neck pain and postnasal drip.   Eyes: Negative for discharge, redness and visual disturbance.  Respiratory: Negative for cough, shortness of breath and wheezing.   Cardiovascular: Negative for leg swelling.  Gastrointestinal: Negative for abdominal pain, constipation and abdominal distention.  Genitourinary: Negative for urgency and frequency.  Musculoskeletal: Negative for joint swelling and arthralgias.  Skin: Negative for color change and rash.  Neurological: Negative for weakness and light-headedness.  Hematological: Negative for adenopathy.  Psychiatric/Behavioral: Negative for behavioral problems.   Past Medical History  Diagnosis Date  . Bipolar 1 disorder   . Irritable bowel syndrome   . Diverticulosis   . Hypertension   . 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 Marital Status: Divorced    Spouse Name: N/A    Number of Children: N/A  . Years of Education: N/A   Occupational History  . Not on file.   Social History Main Topics  . Smoking status: Never Smoker   . Smokeless tobacco: Not on file  . Alcohol Use: No  . Drug Use: No  . Sexually Active: Not on file   Other Topics Concern  . Not on file   Social History Narrative  . No narrative on  file    Past Surgical History  Procedure Date  . Colonoscopy w/ polypectomy 09/07/2002    Family History  Problem Relation Age of Onset  . Cancer Mother   . Stroke Father     No Known Allergies  Current Outpatient Prescriptions on File Prior to Visit  Medication Sig Dispense Refill  . ARIPiprazole (ABILIFY) 5 MG tablet Take 1 tablet (5 mg total) by mouth daily.  90 tablet  3  . Calcium-Vitamin D (CALTRATE 600 PLUS-VIT D PO) Take by mouth 2 (two) times daily.        Marland Kitchen tranylcypromine (PARNATE) 10 MG tablet Take 10 mg by mouth. 2 in am and 3 at bedtime       . Vitamin D, Ergocalciferol, (DRISDOL) 50000 UNITS CAPS TAKE 1 CAPSULE BY MOUTH EVERY WEEK AS DIRECTED  5 capsule  1  . DISCONTD: furosemide (LASIX) 20 MG tablet Take 1 tablet (20 mg total) by mouth daily.  30 tablet  3   Current Facility-Administered Medications on File Prior to Visit  Medication Dose Route Frequency Provider Last Rate Last Dose  . cyanocobalamin ((VITAMIN B-12)) injection 1,000 mcg  1,000 mcg Intramuscular Q30 days Ricard Dillon, MD   1,000 mcg at 01/20/11 1039    BP 160/90  Pulse 84  Temp 98.6 F (37 C)  Resp 18  Ht 5' 6.5" (1.689 m)  Wt 195 lb (88.451 kg)  BMI 31.00 kg/m2       Objective:  Physical Exam  Nursing note and vitals reviewed. Constitutional: He appears well-developed and well-nourished.  HENT:  Head: Normocephalic and atraumatic.  Eyes: Conjunctivae normal are normal. Pupils are equal, round, and reactive to light.  Neck: Normal range of motion. Neck supple.  Cardiovascular: Normal rate and regular rhythm.   Pulmonary/Chest: Effort normal and breath sounds normal.  Abdominal: Soft. Bowel sounds are normal.  Musculoskeletal: He exhibits tenderness.          Assessment & Plan:  She has been to nephrology for monitoring of his renal disease and had an ultrasound of his renal switch was stable.  His blood pressure is moderately elevated today but he rates his pain as 6/10.  He also did not take his Lasix today.  We talked about the consistency of using his blood pressure medications as well as monitoring today for a basic metabolic panel calcium vitamin D. and a TSH for chronic problems. If indeed he is confirmed to have a stress fracture we would repeat his bone density and consider intervention with either a bisphosphonate or prolia

## 2012-01-13 NOTE — Patient Instructions (Signed)
The patient is instructed to continue all medications as prescribed. Schedule followup with check out clerk upon leaving the clinic  

## 2012-01-14 ENCOUNTER — Other Ambulatory Visit: Payer: Self-pay | Admitting: Internal Medicine

## 2012-01-14 LAB — VITAMIN D 25 HYDROXY (VIT D DEFICIENCY, FRACTURES): Vit D, 25-Hydroxy: 64 ng/mL (ref 30–89)

## 2012-04-22 ENCOUNTER — Other Ambulatory Visit: Payer: Self-pay | Admitting: Internal Medicine

## 2012-05-08 ENCOUNTER — Ambulatory Visit (INDEPENDENT_AMBULATORY_CARE_PROVIDER_SITE_OTHER): Payer: Medicare Other | Admitting: Internal Medicine

## 2012-05-08 ENCOUNTER — Encounter: Payer: Self-pay | Admitting: Internal Medicine

## 2012-05-08 VITALS — BP 140/88 | HR 76 | Temp 98.1°F | Resp 16 | Ht 66.5 in | Wt 202.0 lb

## 2012-05-08 DIAGNOSIS — T887XXA Unspecified adverse effect of drug or medicament, initial encounter: Secondary | ICD-10-CM

## 2012-05-08 DIAGNOSIS — I10 Essential (primary) hypertension: Secondary | ICD-10-CM

## 2012-05-08 DIAGNOSIS — N182 Chronic kidney disease, stage 2 (mild): Secondary | ICD-10-CM

## 2012-05-08 LAB — BASIC METABOLIC PANEL
Calcium: 8.9 mg/dL (ref 8.4–10.5)
GFR: 38.36 mL/min — ABNORMAL LOW (ref >60.00)
Glucose, Bld: 88 mg/dL (ref 70–99)
Potassium: 4.1 mEq/L (ref 3.5–5.1)
Sodium: 139 mEq/L (ref 135–145)

## 2012-05-08 NOTE — Patient Instructions (Signed)
The patient is instructed to continue all medications as prescribed. Schedule followup with check out clerk upon leaving the clinic  

## 2012-05-08 NOTE — Progress Notes (Signed)
Subjective:    Patient ID: [name], male    DOB: [birth year], 72 y.o.   [name]  HPI Weight loss is the main issue Dicussed gluten free diet Blood pressure is stable Last creatinine was 1.9   Review of Systems  Constitutional: Positive for fatigue. Negative for fever.  HENT: Negative for hearing loss, congestion, neck pain and postnasal drip.   Eyes: Negative for discharge, redness and visual disturbance.  Respiratory: Negative for cough, shortness of breath and wheezing.   Cardiovascular: Negative for leg swelling.  Gastrointestinal: Negative for abdominal pain, constipation and abdominal distention.  Genitourinary: Negative for urgency and frequency.  Musculoskeletal: Negative for joint swelling and arthralgias.  Skin: Negative for color change and rash.  Neurological: Positive for weakness. Negative for light-headedness.  Hematological: Negative for adenopathy.  Psychiatric/Behavioral: Positive for decreased concentration. Negative for behavioral problems.       The neurontin causes sedation   Past Medical History  Diagnosis Date  . Bipolar 1 disorder   . Irritable bowel syndrome   . Diverticulosis   . Hypertension   . 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 Marital Status: Divorced    Spouse Name: N/A    Number of Children: N/A  . Years of Education: N/A   Occupational History  . Not on file.   Social History Main Topics  . Smoking status: Never Smoker   . Smokeless tobacco: Not on file  . Alcohol Use: No  . Drug Use: No  . Sexually Active: Not on file   Other Topics Concern  . Not on file   Social History Narrative  . No narrative on file    Past Surgical History  Procedure Laterality Date  . Colonoscopy w/ polypectomy  09/07/2002    Family History  Problem Relation Age of Onset  . Cancer Mother   . Stroke Father     No Known  Allergies  Current Outpatient Prescriptions on File Prior to Visit  Medication Sig Dispense Refill  . ARIPiprazole (ABILIFY) 5 MG tablet Take 1 tablet (5 mg total) by mouth daily.  90 tablet  3  . Calcium-Vitamin D (CALTRATE 600 PLUS-VIT D PO) Take by mouth 2 (two) times daily.        . furosemide (LASIX) 20 MG tablet Take 20 mg by mouth daily as needed.      . tranylcypromine (PARNATE) 10 MG tablet Take 10 mg by mouth. 2 in am and 3 at bedtime       . Vitamin D, Ergocalciferol, (DRISDOL) 50000 UNITS CAPS take 1 capsule by mouth every week  4 capsule  1   Current Facility-Administered Medications on File Prior to Visit  Medication Dose Route Frequency Provider Last Rate Last Dose  . cyanocobalamin ((VITAMIN B-12)) injection 1,000 mcg  1,000 mcg Intramuscular Q30 days Ricard Dillon, MD   1,000 mcg at 01/20/11 1039    BP 140/88  Pulse 76  Temp(Src) 98.1 F (36.7 C)  Resp 16  Ht 5' 6.5" (1.689 m)  Wt 202 lb (91.627 kg)  BMI 32.12 kg/m2       Objective:   Physical Exam  Constitutional: He appears well-developed and well-nourished.  HENT:  Head: Normocephalic and atraumatic.  Eyes: Conjunctivae are normal. Pupils are equal, round, and reactive to light.  Neck: Normal range of motion. Neck supple.  Cardiovascular: Normal rate and regular rhythm.  Pulmonary/Chest: Effort normal and breath sounds normal.  Abdominal: Soft. Bowel sounds are normal.          Assessment & Plan:  Neuropathic pain and the use of gabapentin causes some sedation No exercising monitoring for chronic renal  dz  On lasix Legs still swell but wear the compression stockings

## 2012-05-16 ENCOUNTER — Ambulatory Visit: Payer: Medicare Other | Admitting: Internal Medicine

## 2012-06-23 ENCOUNTER — Ambulatory Visit: Payer: Medicare Other | Admitting: Internal Medicine

## 2012-06-27 ENCOUNTER — Encounter: Payer: Self-pay | Admitting: Internal Medicine

## 2012-07-27 ENCOUNTER — Other Ambulatory Visit: Payer: Self-pay | Admitting: Internal Medicine

## 2012-08-22 ENCOUNTER — Telehealth: Payer: Self-pay | Admitting: Internal Medicine

## 2012-08-22 MED ORDER — AZITHROMYCIN 250 MG PO TABS: ORAL_TABLET | ORAL | Status: DC

## 2012-08-22 NOTE — Telephone Encounter (Signed)
Per dr Arnoldo Morale- may have z pack- sent to pharmacy and pt informed

## 2012-08-22 NOTE — Telephone Encounter (Signed)
Patient Information:  Caller Name: Rorke  Phone: [phone]  Patient: [name], [name]  Gender: Male  DOB: 09/20/1940  Age: 72 Years  PCP: Benay Pillow (Adults only)  Office Follow Up:  Does the office need to follow up with this patient?: Yes  Instructions For The Office: OFFICE PLEASE FOLLOW UP WITH PT IF MEDICAITON CAN BE CALLED IN FOR SYMPTOMS  RN Note:  pt denies any color to drainage  Symptoms  Reason For Call & Symptoms: head cold and bad cough (congested)  Reviewed Health History In EMR: Yes  Reviewed Medications In EMR: Yes  Reviewed Allergies In EMR: Yes  Reviewed Surgeries / Procedures: Yes  Date of Onset of Symptoms: 08/17/2012  Treatments Tried: Mucinex  Treatments Tried Worked: No  Guideline(s) Used:  Colds  Disposition Per Guideline:   Home Care  Reason For Disposition Reached:   Colds with no complications  Advice Given:  For a Stuffy Nose - Use Nasal Washes:  Introduction: Saline (salt water) nasal irrigation (nasal wash) is an effective and simple home remedy for treating stuffy nose and sinus congestion. The nose can be irrigated by pouring, spraying, or squirting salt water into the nose and then letting it run back out.  How it Helps: The salt water rinses out excess mucus, washes out any irritants (dust, allergens) that might be present, and moistens the nasal cavity.  Methods: There are several ways to perform nasal irrigation. You can use a saline nasal spray bottle (available over-the-counter), a rubber ear syringe, a medical syringe without the needle, or a Neti Pot.  Humidifier:  If the air in your home is dry, use a cool-mist humidifier  Call Back If:  You become worse  Patient Refused Recommendation:  Patient Requests Prescription  pt is requesting a RX to be called in for him.  Pt uses [facility] off [address]

## 2012-08-22 NOTE — Telephone Encounter (Signed)
Spoke to the pt.  He complains of yellow nasal drainage, yellow sputum production, painful cough, post nasal drainage and believes he has a fever.  Ongoing for 4-5 days.  Has tried Mucinex and Advil without relief.  Tried to find the patient an appointment with another provider.  No appt available.  Sending to [facility] for further orders.

## 2012-09-04 ENCOUNTER — Ambulatory Visit: Payer: Medicare Other | Admitting: Internal Medicine

## 2012-09-05 ENCOUNTER — Ambulatory Visit (INDEPENDENT_AMBULATORY_CARE_PROVIDER_SITE_OTHER): Payer: Medicare Other | Admitting: Internal Medicine

## 2012-09-05 ENCOUNTER — Encounter: Payer: Self-pay | Admitting: Internal Medicine

## 2012-09-05 VITALS — BP 130/80 | HR 76 | Temp 98.6°F | Resp 16 | Ht 66.5 in | Wt 185.0 lb

## 2012-09-05 DIAGNOSIS — N182 Chronic kidney disease, stage 2 (mild): Secondary | ICD-10-CM

## 2012-09-05 DIAGNOSIS — R0609 Other forms of dyspnea: Secondary | ICD-10-CM

## 2012-09-05 DIAGNOSIS — R600 Localized edema: Secondary | ICD-10-CM

## 2012-09-05 DIAGNOSIS — I10 Essential (primary) hypertension: Secondary | ICD-10-CM

## 2012-09-05 DIAGNOSIS — R0600 Dyspnea, unspecified: Secondary | ICD-10-CM

## 2012-09-05 DIAGNOSIS — E039 Hypothyroidism, unspecified: Secondary | ICD-10-CM

## 2012-09-05 DIAGNOSIS — R609 Edema, unspecified: Secondary | ICD-10-CM

## 2012-09-05 LAB — CBC WITH DIFFERENTIAL/PLATELET
Basophils Relative: 0.5 % (ref 0.0–3.0)
Eosinophils Relative: 1.7 % (ref 0.0–5.0)
HCT: 38.2 % — ABNORMAL LOW (ref 39.0–52.0)
Hemoglobin: 12.7 g/dL — ABNORMAL LOW (ref 13.0–17.0)
Lymphs Abs: 1.1 10*3/uL (ref 0.7–4.0)
MCV: 93.2 fl (ref 78.0–100.0)
Monocytes Absolute: 0.6 10*3/uL (ref 0.1–1.0)
Neutro Abs: 5.9 10*3/uL (ref 1.4–7.7)
RBC: 4.1 Mil/uL — ABNORMAL LOW (ref 4.22–5.81)
WBC: 7.7 10*3/uL (ref 4.5–10.5)

## 2012-09-05 LAB — BASIC METABOLIC PANEL
CO2: 24 mEq/L (ref 19–32)
Chloride: 109 mEq/L (ref 96–112)
Glucose, Bld: 98 mg/dL (ref 70–99)
Sodium: 141 mEq/L (ref 135–145)

## 2012-09-05 NOTE — Progress Notes (Signed)
Subjective:    Patient ID: [name], male    DOB: 04-Jul-1940, 72 y.o.   MRN: [id number]  HPI  Follow-up of hypertension and chronic renal disease stage II. chronic venous stasis changes to his legs 2 to varicosities Due monitoring of renal function discussion of mild dyspnea but also noted lack of exercise  Review of Systems  Constitutional: Negative for fever and fatigue.  HENT: Negative for hearing loss, congestion, neck pain and postnasal drip.   Eyes: Negative for discharge, redness and visual disturbance.  Respiratory: Negative for cough, shortness of breath and wheezing.   Cardiovascular: Negative for leg swelling.  Gastrointestinal: Negative for abdominal pain, constipation and abdominal distention.  Genitourinary: Negative for urgency and frequency.  Musculoskeletal: Negative for joint swelling and arthralgias.  Skin: Negative for color change and rash.  Neurological: Negative for weakness and light-headedness.  Hematological: Negative for adenopathy.  Psychiatric/Behavioral: Negative for behavioral problems.   Past Medical History  Diagnosis Date  . Bipolar 1 disorder   . Irritable bowel syndrome   . Diverticulosis   . Hypertension   . 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 Marital Status: Divorced    Spouse Name: N/A    Number of Children: N/A  . Years of Education: N/A   Occupational History  . Not on file.   Social History Main Topics  . Smoking status: Never Smoker   . Smokeless tobacco: Not on file  . Alcohol Use: No  . Drug Use: No  . Sexually Active: Not on file   Other Topics Concern  . Not on file   Social History Narrative  . No narrative on file    Past Surgical History  Procedure Laterality Date  . Colonoscopy w/ polypectomy  09/07/2002    Family History  Problem Relation Age of Onset  . Cancer Mother   . Stroke Father      No Known Allergies  Current Outpatient Prescriptions on File Prior to Visit  Medication Sig Dispense Refill  . ARIPiprazole (ABILIFY) 5 MG tablet Take 1 tablet (5 mg total) by mouth daily.  90 tablet  3  . azithromycin (ZITHROMAX Z-PAK) 250 MG tablet Take 2 tabs today, then 1 daily until completed  6 each  0  . Calcium-Vitamin D (CALTRATE 600 PLUS-VIT D PO) Take by mouth 2 (two) times daily.        . furosemide (LASIX) 20 MG tablet Take 20 mg by mouth daily as needed.      . tranylcypromine (PARNATE) 10 MG tablet Take 10 mg by mouth. 2 in am and 3 at bedtime       . Vitamin D, Ergocalciferol, (DRISDOL) 50000 UNITS CAPS take 1 capsule by mouth every week  4 capsule  1   Current Facility-Administered Medications on File Prior to Visit  Medication Dose Route Frequency Provider Last Rate Last Dose  . cyanocobalamin ((VITAMIN B-12)) injection 1,000 mcg  1,000 mcg Intramuscular Q30 days Ricard Dillon, MD   1,000 mcg at 01/20/11 1039    BP 130/80  Pulse 76  Temp(Src) 98.6 F (37 C)  Resp 16  Ht 5' 6.5" (1.689 m)  Wt 185 lb (83.915 kg)  BMI 29.42 kg/m2       Objective:   Physical Exam  Constitutional: He appears well-developed and well-nourished.  HENT:  Head: Normocephalic and atraumatic.  Eyes: Conjunctivae are normal. Pupils  are equal, round, and reactive to light.  Neck: Normal range of motion. Neck supple.  Cardiovascular: Normal rate and regular rhythm.   Pulmonary/Chest: Effort normal and breath sounds normal.  Abdominal: Soft. Bowel sounds are normal.          Assessment & Plan:  Leg edema with chronic changes in skin Not taking the lasix regularly discussion of taking it every day Monitoring for renal panel, cbc and tsh due to fatigue\ Echo due to increased edema and dyspnea

## 2012-09-14 ENCOUNTER — Other Ambulatory Visit (HOSPITAL_COMMUNITY): Payer: Self-pay | Admitting: Internal Medicine

## 2012-09-14 ENCOUNTER — Other Ambulatory Visit: Payer: Self-pay

## 2012-09-14 ENCOUNTER — Ambulatory Visit (HOSPITAL_COMMUNITY): Payer: Medicare Other | Attending: Cardiology | Admitting: Radiology

## 2012-09-14 DIAGNOSIS — R0602 Shortness of breath: Secondary | ICD-10-CM

## 2012-09-14 DIAGNOSIS — R609 Edema, unspecified: Secondary | ICD-10-CM

## 2012-09-14 DIAGNOSIS — R0989 Other specified symptoms and signs involving the circulatory and respiratory systems: Secondary | ICD-10-CM

## 2012-09-14 DIAGNOSIS — R0609 Other forms of dyspnea: Secondary | ICD-10-CM | POA: Insufficient documentation

## 2012-09-14 NOTE — Progress Notes (Signed)
Echocardiogram performed.  

## 2012-09-26 ENCOUNTER — Telehealth: Payer: Self-pay | Admitting: Internal Medicine

## 2012-09-26 NOTE — Telephone Encounter (Signed)
Pt calling to request lab results from 6/17, and the results of his echo from 6/26. Please assist.

## 2012-09-26 NOTE — Telephone Encounter (Signed)
Caller: [name]/Patient; Phone: [phone]; Reason for Call: Called to request recent lab results and echocardiogram report be mailed to his home.

## 2012-09-27 ENCOUNTER — Other Ambulatory Visit: Payer: Self-pay | Admitting: *Deleted

## 2012-09-27 DIAGNOSIS — N289 Disorder of kidney and ureter, unspecified: Secondary | ICD-10-CM

## 2012-09-27 NOTE — Telephone Encounter (Signed)
The renal function was stablebut he has renal insuficiency The bllod count was also stable The echo showed changes from HTN but to cause for the edema ( no heart failure) We probable should get renal consult to make sure the kidney d1sease has not progressed

## 2012-09-27 NOTE — Telephone Encounter (Signed)
Pt informed and referral made- copy of labs out front for pt

## 2012-10-09 ENCOUNTER — Telehealth: Payer: Self-pay | Admitting: Internal Medicine

## 2012-10-09 NOTE — Telephone Encounter (Signed)
Pt would like referral to cardiologist. Would like to be checked out by [facility]. No real issues, has never been to one and would like to go see one.  Pt's neighbor is Orthoptist and would ike to see him.

## 2012-10-09 NOTE — Telephone Encounter (Signed)
Talked with pt and told him - that we needed dx. Requesting results from echo- pt informed ,dr Arnoldo Morale would return Thursday and I would call back with results and if needed to go to card,per echo,dr jenkins would decided

## 2012-10-12 ENCOUNTER — Other Ambulatory Visit: Payer: Self-pay | Admitting: Internal Medicine

## 2012-10-13 ENCOUNTER — Other Ambulatory Visit: Payer: Self-pay | Admitting: *Deleted

## 2012-10-13 DIAGNOSIS — R0602 Shortness of breath: Secondary | ICD-10-CM

## 2012-10-13 NOTE — Telephone Encounter (Signed)
Echo looked good The grade one "diastolic dysfunction" Is mild for a gentleman of his age and history of hypertension There is no evidence for any wall motion abnormality indicating cardiac damage And had a good ejection fraction This test did not suggest any cardiac etiology or cause for shortness of breath Next up would be to do pulmonary functions. Please set up pulmonary functions at [facility]

## 2012-10-13 NOTE — Telephone Encounter (Signed)
Pt informed and pt states he has no hypertension problem- looked back over ov and he had lower extremity swelling and bp 160/90-was put on lasix and bp and swelling cam e down.  Pt informed of pft

## 2012-12-18 ENCOUNTER — Other Ambulatory Visit: Payer: Self-pay | Admitting: Internal Medicine

## 2013-01-05 ENCOUNTER — Ambulatory Visit: Payer: Medicare Other | Admitting: Internal Medicine

## 2013-01-12 ENCOUNTER — Ambulatory Visit: Payer: Medicare Other | Admitting: Internal Medicine

## 2013-01-19 ENCOUNTER — Ambulatory Visit (INDEPENDENT_AMBULATORY_CARE_PROVIDER_SITE_OTHER): Payer: Medicare Other | Admitting: Internal Medicine

## 2013-01-19 ENCOUNTER — Encounter: Payer: Self-pay | Admitting: Internal Medicine

## 2013-01-19 VITALS — BP 140/86 | HR 76 | Temp 98.2°F | Resp 16 | Ht 66.5 in | Wt 183.0 lb

## 2013-01-19 DIAGNOSIS — I10 Essential (primary) hypertension: Secondary | ICD-10-CM

## 2013-01-19 DIAGNOSIS — D649 Anemia, unspecified: Secondary | ICD-10-CM

## 2013-01-19 DIAGNOSIS — N182 Chronic kidney disease, stage 2 (mild): Secondary | ICD-10-CM

## 2013-01-19 DIAGNOSIS — Z23 Encounter for immunization: Secondary | ICD-10-CM

## 2013-01-19 DIAGNOSIS — F3131 Bipolar disorder, current episode depressed, mild: Secondary | ICD-10-CM

## 2013-01-19 DIAGNOSIS — E039 Hypothyroidism, unspecified: Secondary | ICD-10-CM

## 2013-01-19 LAB — BASIC METABOLIC PANEL
CO2: 32 mEq/L (ref 19–32)
Calcium: 9.4 mg/dL (ref 8.4–10.5)
Creatinine, Ser: 2.3 mg/dL — ABNORMAL HIGH (ref 0.4–1.5)
GFR: 29.34 mL/min — ABNORMAL LOW (ref >60.00)
Glucose, Bld: 106 mg/dL — ABNORMAL HIGH (ref 70–99)
Sodium: 137 mEq/L (ref 135–145)

## 2013-01-19 LAB — CBC WITH DIFFERENTIAL/PLATELET
Basophils Absolute: 0 10*3/uL (ref 0.0–0.1)
Basophils Relative: 0.6 % (ref 0.0–3.0)
Eosinophils Absolute: 0.2 10*3/uL (ref 0.0–0.7)
MCHC: 33.8 g/dL (ref 30.0–36.0)
MCV: 91.8 fl (ref 78.0–100.0)
Monocytes Absolute: 0.6 10*3/uL (ref 0.1–1.0)
Neutrophils Relative %: 69.9 % (ref 43.0–77.0)
RBC: 4.36 Mil/uL (ref 4.22–5.81)
RDW: 13.4 % (ref 11.5–14.6)

## 2013-01-19 NOTE — Patient Instructions (Signed)
The patient is instructed to continue all medications as prescribed. Schedule followup with check out clerk upon leaving the clinic  

## 2013-01-19 NOTE — Progress Notes (Signed)
  Subjective:    Patient ID: [name], male    DOB: [birth year], 72 y.o.   [name]  HPI Patient followed form [facility] ( was on lithium) Mild peripheral neuropathy on neurotin Sleepy possible due to the neurontin Bipolar stable  Review of Systems  Constitutional: Negative for fever and fatigue.  HENT: Negative for congestion, hearing loss and postnasal drip.   Eyes: Negative for discharge, redness and visual disturbance.  Respiratory: Negative for cough, shortness of breath and wheezing.   Cardiovascular: Negative for leg swelling.  Gastrointestinal: Negative for abdominal pain, constipation and abdominal distention.  Genitourinary: Negative for urgency and frequency.  Musculoskeletal: Negative for arthralgias, joint swelling and neck pain.  Skin: Negative for color change and rash.  Neurological: Negative for weakness and light-headedness.  Hematological: Negative for adenopathy.  Psychiatric/Behavioral: Negative for behavioral problems.       Objective:   Physical Exam  Constitutional: He appears well-developed and well-nourished.  HENT:  Head: Normocephalic and atraumatic.  Eyes: Conjunctivae are normal. Pupils are equal, round, and reactive to light.  Neck: Normal range of motion. Neck supple.  Cardiovascular: Normal rate, regular rhythm and intact distal pulses.   Pulmonary/Chest: Effort normal and breath sounds normal.  Abdominal: Soft. Bowel sounds are normal.          Assessment & Plan:  Stable HTN Monitor the renal function Stable Bipolar with no manic behavior reported Monitor anemia and iron

## 2013-02-19 ENCOUNTER — Other Ambulatory Visit: Payer: Self-pay | Admitting: Internal Medicine

## 2013-04-30 ENCOUNTER — Other Ambulatory Visit: Payer: Self-pay | Admitting: Internal Medicine

## 2013-05-16 ENCOUNTER — Other Ambulatory Visit: Payer: Self-pay | Admitting: Orthopedic Surgery

## 2013-05-17 ENCOUNTER — Encounter (HOSPITAL_COMMUNITY): Payer: Self-pay | Admitting: Pharmacy Technician

## 2013-05-21 ENCOUNTER — Ambulatory Visit (HOSPITAL_COMMUNITY)
Admission: RE | Admit: 2013-05-21 | Discharge: 2013-05-21 | Disposition: A | Discharge status: Home / Self Care | Payer: Medicare Other | Source: Ambulatory Visit | Attending: Anesthesiology | Admitting: Anesthesiology

## 2013-05-21 ENCOUNTER — Encounter (HOSPITAL_COMMUNITY)
Admission: RE | Admit: 2013-05-21 | Discharge: 2013-05-21 | Disposition: A | Discharge status: Home / Self Care | Payer: Medicare Other | Source: Ambulatory Visit | Attending: Orthopedic Surgery | Admitting: Orthopedic Surgery

## 2013-05-21 ENCOUNTER — Encounter (HOSPITAL_COMMUNITY): Payer: Self-pay

## 2013-05-21 VITALS — BP 134/73 | HR 88 | Temp 98.1°F | Resp 20 | Ht 66.5 in | Wt 198.2 lb

## 2013-05-21 DIAGNOSIS — S42213A Unspecified displaced fracture of surgical neck of unspecified humerus, initial encounter for closed fracture: Secondary | ICD-10-CM

## 2013-05-21 HISTORY — DX: Age-related osteoporosis without current pathological fracture: M81.0

## 2013-05-21 HISTORY — DX: Edema, unspecified: R60.9

## 2013-05-21 HISTORY — DX: Major depressive disorder, single episode, unspecified: F32.9

## 2013-05-21 HISTORY — DX: Dorsalgia, unspecified: M54.9

## 2013-05-21 HISTORY — DX: Chronic kidney disease, unspecified: N18.9

## 2013-05-21 HISTORY — DX: Pain in unspecified joint: M25.50

## 2013-05-21 HISTORY — DX: Sleep apnea, unspecified: G47.30

## 2013-05-21 HISTORY — DX: Personal history of colonic polyps: Z86.010

## 2013-05-21 HISTORY — DX: Headache: R51

## 2013-05-21 HISTORY — DX: Effusion, unspecified joint: M25.40

## 2013-05-21 HISTORY — DX: Personal history of colon polyps, unspecified: Z86.0100

## 2013-05-21 HISTORY — DX: Depression, unspecified: F32.A

## 2013-05-21 HISTORY — DX: Localized edema: R60.0

## 2013-05-21 LAB — PROTIME-INR
INR: 0.96 (ref 0.00–1.49)
Prothrombin Time: 12.6 seconds (ref 11.6–15.2)

## 2013-05-21 LAB — BASIC METABOLIC PANEL
BUN: 42 mg/dL — AB (ref 6–23)
CALCIUM: 9.6 mg/dL (ref 8.4–10.5)
CO2: 26 mEq/L (ref 19–32)
Chloride: 101 mEq/L (ref 96–112)
Creatinine, Ser: 2.25 mg/dL — ABNORMAL HIGH (ref 0.50–1.35)
GFR calc Af Amer: 32 mL/min — ABNORMAL LOW (ref >90)
GFR calc non Af Amer: 27 mL/min — ABNORMAL LOW (ref >90)
GLUCOSE: 97 mg/dL (ref 70–99)
Potassium: 4.2 mEq/L (ref 3.7–5.3)
Sodium: 139 mEq/L (ref 137–147)

## 2013-05-21 LAB — CBC
HCT: 33.8 % — ABNORMAL LOW (ref 39.0–52.0)
Hemoglobin: 11.3 g/dL — ABNORMAL LOW (ref 13.0–17.0)
MCH: 31.1 pg (ref 26.0–34.0)
MCHC: 33.4 g/dL (ref 30.0–36.0)
MCV: 93.1 fL (ref 78.0–100.0)
PLATELETS: 242 10*3/uL (ref 150–400)
RBC: 3.63 MIL/uL — ABNORMAL LOW (ref 4.22–5.81)
RDW: 12.7 % (ref 11.5–15.5)
WBC: 9.3 10*3/uL (ref 4.0–10.5)

## 2013-05-21 LAB — TYPE AND SCREEN
ABO/RH(D): A POS
Antibody Screen: NEGATIVE

## 2013-05-21 LAB — APTT: aPTT: 35 seconds (ref 24–37)

## 2013-05-21 LAB — ABO/RH: ABO/RH(D): A POS

## 2013-05-21 NOTE — Anesthesia Preprocedure Evaluation (Addendum)
Anesthesia Evaluation  Patient identified by MRN, date of birth, ID band Patient awake    Reviewed: Allergy & Precautions, H&P , NPO status , Patient's Chart, lab work & pertinent test results  History of Anesthesia Complications Negative for: history of anesthetic complications  Airway Mallampati: II TM Distance: >3 FB Neck ROM: Full    Dental  (+) Teeth Intact, Dental Advisory Given   Pulmonary sleep apnea ,    Pulmonary exam normal       Cardiovascular hypertension, + Peripheral Vascular Disease     Neuro/Psych  Headaches, PSYCHIATRIC DISORDERS Depression Bipolar Disorder CVA    GI/Hepatic negative GI ROS, Neg liver ROS,   Endo/Other  Hypothyroidism   Renal/GU Renal InsufficiencyRenal disease     Musculoskeletal   Abdominal   Peds  Hematology   Anesthesia Other Findings   Reproductive/Obstetrics                         Anesthesia Physical Anesthesia Plan  ASA: III  Anesthesia Plan: General   Post-op Pain Management:    Induction: Intravenous  Airway Management Planned: Oral ETT  Additional Equipment:   Intra-op Plan:   Post-operative Plan: Extubation in OR  Informed Consent: I have reviewed the patients History and Physical, chart, labs and discussed the procedure including the risks, benefits and alternatives for the proposed anesthesia with the patient or authorized representative who has indicated his/her understanding and acceptance.   Dental advisory given  Plan Discussed with: CRNA, Anesthesiologist and Surgeon  Anesthesia Plan Comments: (See anesthesia note.  He is on a MAOI.  Myra Gianotti, PA-C)      Anesthesia Quick Evaluation

## 2013-05-21 NOTE — Progress Notes (Signed)
Anesthesia Chart Review:  Patient is a 73 year old male scheduled for reverse shoulder arthroplasty, right on 05/22/13 by Dr. Tamera Punt.  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 PCP is Dr. Benay Pillow. Nephrologist is Dr. Erling Cruz.  Meds includes Abilify, Caltrate, Drisdol, Vitamin B12, Lasix, tranylcyromine (Parnate) which is an MAO inhibitor. He has not stopped his MAOI, and was very adamant with his PAT RN that he could not stop it due to his depression.  EKG on 05/21/13 showed NSR.  Echo on 09/14/12 showed:  - Left ventricle: The cavity size was normal. Wall thickness was increased in a pattern of mild LVH. Systolic function was normal. The estimated ejection fraction was in the range of 55% to 60%. Wall motion was normal; there were no regional wall motion abnormalities. Doppler parameters are consistent with abnormal left ventricular relaxation (grade 1 diastolic dysfunction). - Aortic valve: Trivial regurgitation. - Ascending aorta: The ascending aorta was mildly dilated. (Aortic root diameter 34 mm. AAo AP 38 mm.)  CXR on 05/21/13 showed no active disease.  Preoperative labs noted.  BUN/Cr 42/2.25, overall stable since 08/2012.  H/H 11.3/33.8.  PT/PTT WNL  Patient's renal function appears stable.  Recent EKG and CXR acceptable for OR.  I did update anesthesiologist Dr. Tobias Alexander that patient was on an MAOI. Anticipate that he can proceed as planned.  [name]  [facility] Short Stay Center/Anesthesiology Phone [phone] 05/21/2013 3:35 PM

## 2013-05-21 NOTE — Progress Notes (Addendum)
Pt doesn't have a cardiologist  Denies ever having a stress test/heart cath  Medical MD is Dr.John Arnoldo Morale   Echo report in epic from 2014  Sleep study in epic from 2010

## 2013-05-21 NOTE — Pre-Procedure Instructions (Signed)
[name]  05/21/2013   Your procedure is scheduled on:  Tues, Mar 3 @ 10:05 AM  Report to [facility] Short Stay Entrance A  at 8:00 AM.  Call this number if you have problems the morning of surgery: [phone]   Remember:   Do not eat food or drink liquids after midnight.   Take these medicines the morning of surgery with A SIP OF WATER: Abilify(Ariprazole)              No Goody's,BC's,Aleve,Aspirin,Ibuprofen,Fish Oil,or any Herbal Medications   Do not wear jewelry  Do not wear lotions, powders, or colognes.   Men may shave face and neck.  Do not bring valuables to the hospital.  [facility] is not responsible                  for any belongings or valuables.               Contacts, dentures or bridgework may not be worn into surgery.  Leave suitcase in the car. After surgery it may be brought to your room.  For patients admitted to the hospital, discharge time is determined by your                treatment team.               Special Instructions:  [facility] - Preparing for Surgery  Before surgery, you can play an important role.  Because skin is not sterile, your skin needs to be as free of germs as possible.  You can reduce the number of germs on you skin by washing with CHG (chlorahexidine gluconate) soap before surgery.  CHG is an antiseptic cleaner which kills germs and bonds with the skin to continue killing germs even after washing.  Please DO NOT use if you have an allergy to CHG or antibacterial soaps.  If your skin becomes reddened/irritated stop using the CHG and inform your nurse when you arrive at Short Stay.  Do not shave (including legs and underarms) for at least 48 hours prior to the first CHG shower.  You may shave your face.  Please follow these instructions carefully:   1.  Shower with CHG Soap the night before surgery and the                                morning of Surgery.  2.  If you choose to wash your hair, wash your hair first as usual with  your       normal shampoo.  3.  After you shampoo, rinse your hair and body thoroughly to remove the                      Shampoo.  4.  Use CHG as you would any other liquid soap.  You can apply chg directly       to the skin and wash gently with scrungie or a clean washcloth.  5.  Apply the CHG Soap to your body ONLY FROM THE NECK DOWN.        Do not use on open wounds or open sores.  Avoid contact with your eyes,       ears, mouth and genitals (private parts).  Wash genitals (private parts)       with your normal soap.  6.  Wash thoroughly, paying special attention to the area where your  surgery        will be performed.  7.  Thoroughly rinse your body with warm water from the neck down.  8.  DO NOT shower/wash with your normal soap after using and rinsing off       the CHG Soap.  9.  Pat yourself dry with a clean towel.            10.  Wear clean pajamas.            11.  Place clean sheets on your bed the night of your first shower and do not        sleep with pets.  Day of Surgery  Do not apply any lotions/deoderants the morning of surgery.  Please wear clean clothes to the hospital/surgery center.     Please read over the following fact sheets that you were given: Pain Booklet, Coughing and Deep Breathing, Blood Transfusion Information and Surgical Site Infection Prevention

## 2013-05-22 ENCOUNTER — Encounter (HOSPITAL_COMMUNITY)
Admission: RE | Disposition: A | Discharge status: Home / Self Care | Payer: Self-pay | Source: Ambulatory Visit | Attending: Orthopedic Surgery

## 2013-05-22 ENCOUNTER — Inpatient Hospital Stay (HOSPITAL_COMMUNITY): Payer: Medicare Other

## 2013-05-22 ENCOUNTER — Inpatient Hospital Stay (HOSPITAL_COMMUNITY): Payer: Medicare Other | Admitting: Anesthesiology

## 2013-05-22 ENCOUNTER — Inpatient Hospital Stay (HOSPITAL_COMMUNITY)
Admission: RE | Admit: 2013-05-22 | Discharge: 2013-05-23 | DRG: 483 | Disposition: A | Discharge status: Home / Self Care | Payer: Medicare Other | Source: Ambulatory Visit | Attending: Orthopedic Surgery | Admitting: Orthopedic Surgery

## 2013-05-22 ENCOUNTER — Encounter (HOSPITAL_COMMUNITY): Payer: Medicare Other | Admitting: Vascular Surgery

## 2013-05-22 ENCOUNTER — Encounter (HOSPITAL_COMMUNITY): Payer: Self-pay | Admitting: Surgery

## 2013-05-22 DIAGNOSIS — M810 Age-related osteoporosis without current pathological fracture: Secondary | ICD-10-CM | POA: Diagnosis present

## 2013-05-22 DIAGNOSIS — R609 Edema, unspecified: Secondary | ICD-10-CM | POA: Diagnosis present

## 2013-05-22 DIAGNOSIS — S42209A Unspecified fracture of upper end of unspecified humerus, initial encounter for closed fracture: Principal | ICD-10-CM | POA: Diagnosis present

## 2013-05-22 DIAGNOSIS — F329 Major depressive disorder, single episode, unspecified: Secondary | ICD-10-CM | POA: Diagnosis present

## 2013-05-22 DIAGNOSIS — N189 Chronic kidney disease, unspecified: Secondary | ICD-10-CM | POA: Diagnosis present

## 2013-05-22 DIAGNOSIS — W19XXXA Unspecified fall, initial encounter: Secondary | ICD-10-CM | POA: Diagnosis present

## 2013-05-22 DIAGNOSIS — F3289 Other specified depressive episodes: Secondary | ICD-10-CM | POA: Diagnosis present

## 2013-05-22 DIAGNOSIS — G4730 Sleep apnea, unspecified: Secondary | ICD-10-CM | POA: Diagnosis present

## 2013-05-22 DIAGNOSIS — Y92009 Unspecified place in unspecified non-institutional (private) residence as the place of occurrence of the external cause: Secondary | ICD-10-CM

## 2013-05-22 HISTORY — PX: REVERSE SHOULDER ARTHROPLASTY: SHX5054

## 2013-05-22 LAB — HEPATIC FUNCTION PANEL
ALK PHOS: 76 U/L (ref 39–117)
ALT: 22 U/L (ref 0–53)
AST: 25 U/L (ref 0–37)
Albumin: 3.2 g/dL — ABNORMAL LOW (ref 3.5–5.2)
BILIRUBIN TOTAL: 0.5 mg/dL (ref 0.3–1.2)
Bilirubin, Direct: <0.2 mg/dL (ref 0.0–0.3)
Total Protein: 6.3 g/dL (ref 6.0–8.3)

## 2013-05-22 LAB — CBC
HCT: 33.1 % — ABNORMAL LOW (ref 39.0–52.0)
Hemoglobin: 11 g/dL — ABNORMAL LOW (ref 13.0–17.0)
MCH: 30.7 pg (ref 26.0–34.0)
MCHC: 33.2 g/dL (ref 30.0–36.0)
MCV: 92.5 fL (ref 78.0–100.0)
PLATELETS: 236 10*3/uL (ref 150–400)
RBC: 3.58 MIL/uL — ABNORMAL LOW (ref 4.22–5.81)
RDW: 12.6 % (ref 11.5–15.5)
WBC: 7 10*3/uL (ref 4.0–10.5)

## 2013-05-22 LAB — DIFFERENTIAL
Basophils Absolute: 0 10*3/uL (ref 0.0–0.1)
Basophils Relative: 1 % (ref 0–1)
Eosinophils Absolute: 0.4 10*3/uL (ref 0.0–0.7)
Eosinophils Relative: 5 % (ref 0–5)
Lymphocytes Relative: 22 % (ref 12–46)
Lymphs Abs: 1.6 10*3/uL (ref 0.7–4.0)
MONO ABS: 0.6 10*3/uL (ref 0.1–1.0)
MONOS PCT: 9 % (ref 3–12)
NEUTROS ABS: 4.4 10*3/uL (ref 1.7–7.7)
Neutrophils Relative %: 63 % (ref 43–77)

## 2013-05-22 SURGERY — ARTHROPLASTY, SHOULDER, TOTAL, REVERSE
Anesthesia: General | Site: Shoulder | Laterality: Right

## 2013-05-22 MED ORDER — PHENYLEPHRINE HCL 10 MG/ML IJ SOLN
INTRAMUSCULAR | Status: DC | PRN
  Administered 2013-05-22 (×2): 80 ug via INTRAVENOUS

## 2013-05-22 MED ORDER — MENTHOL 3 MG MT LOZG: 1.0000 | LOZENGE | OROMUCOSAL | Status: DC | PRN

## 2013-05-22 MED ORDER — HYDROMORPHONE HCL PF 1 MG/ML IJ SOLN: 0.2500 mg | INTRAMUSCULAR | Status: DC | PRN

## 2013-05-22 MED ORDER — ONDANSETRON HCL 4 MG/2ML IJ SOLN: 4.0000 mg | Freq: Four times a day (QID) | INTRAMUSCULAR | Status: DC | PRN

## 2013-05-22 MED ORDER — GLYCOPYRROLATE 0.2 MG/ML IJ SOLN
INTRAMUSCULAR | Status: DC | PRN
  Administered 2013-05-22: .8 mg via INTRAVENOUS

## 2013-05-22 MED ORDER — TRANYLCYPROMINE SULFATE 10 MG PO TABS
30.0000 mg | ORAL_TABLET | Freq: Every day | ORAL | Status: DC
  Filled 2013-05-22 (×3): qty 3

## 2013-05-22 MED ORDER — NEOSTIGMINE METHYLSULFATE 1 MG/ML IJ SOLN
INTRAMUSCULAR | Status: AC
  Filled 2013-05-22: qty 10

## 2013-05-22 MED ORDER — CEFAZOLIN SODIUM-DEXTROSE 2-3 GM-% IV SOLR
2.0000 g | Freq: Four times a day (QID) | INTRAVENOUS | Status: AC
  Administered 2013-05-22 – 2013-05-23 (×3): 2 g via INTRAVENOUS
  Filled 2013-05-22 (×3): qty 50

## 2013-05-22 MED ORDER — CEFAZOLIN SODIUM-DEXTROSE 2-3 GM-% IV SOLR
INTRAVENOUS | Status: AC
  Administered 2013-05-22: 2 g via INTRAVENOUS
  Filled 2013-05-22: qty 50

## 2013-05-22 MED ORDER — ONDANSETRON HCL 4 MG PO TABS: 4.0000 mg | ORAL_TABLET | Freq: Four times a day (QID) | ORAL | Status: DC | PRN

## 2013-05-22 MED ORDER — DOCUSATE SODIUM 100 MG PO CAPS
100.0000 mg | ORAL_CAPSULE | Freq: Two times a day (BID) | ORAL | Status: DC
  Administered 2013-05-22 – 2013-05-23 (×2): 100 mg via ORAL
  Filled 2013-05-22 (×3): qty 1

## 2013-05-22 MED ORDER — GLYCOPYRROLATE 0.2 MG/ML IJ SOLN
INTRAMUSCULAR | Status: AC
  Filled 2013-05-22: qty 2

## 2013-05-22 MED ORDER — SODIUM CHLORIDE 0.9 % IR SOLN
Status: DC | PRN
  Administered 2013-05-22: 1000 mL

## 2013-05-22 MED ORDER — LACTATED RINGERS IV SOLN: INTRAVENOUS | Status: DC

## 2013-05-22 MED ORDER — FENTANYL CITRATE 0.05 MG/ML IJ SOLN
INTRAMUSCULAR | Status: DC | PRN
  Administered 2013-05-22: 100 ug via INTRAVENOUS
  Administered 2013-05-22: 50 ug via INTRAVENOUS

## 2013-05-22 MED ORDER — ONDANSETRON HCL 4 MG/2ML IJ SOLN
INTRAMUSCULAR | Status: DC | PRN
  Administered 2013-05-22: 4 mg via INTRAVENOUS

## 2013-05-22 MED ORDER — ACETAMINOPHEN 650 MG RE SUPP: 650.0000 mg | Freq: Four times a day (QID) | RECTAL | Status: DC | PRN

## 2013-05-22 MED ORDER — FENTANYL CITRATE 0.05 MG/ML IJ SOLN
50.0000 ug | Freq: Once | INTRAMUSCULAR | Status: AC
  Administered 2013-05-22: 50 ug via INTRAVENOUS

## 2013-05-22 MED ORDER — PROMETHAZINE HCL 25 MG/ML IJ SOLN: 6.2500 mg | INTRAMUSCULAR | Status: DC | PRN

## 2013-05-22 MED ORDER — TRANYLCYPROMINE SULFATE 10 MG PO TABS
20.0000 mg | ORAL_TABLET | Freq: Every day | ORAL | Status: DC
  Administered 2013-05-23: 20 mg via ORAL
  Filled 2013-05-22: qty 2

## 2013-05-22 MED ORDER — MIDAZOLAM HCL 2 MG/2ML IJ SOLN: 1.0000 mg | Freq: Once | INTRAMUSCULAR | Status: DC

## 2013-05-22 MED ORDER — OXYCODONE HCL 5 MG PO TABS
5.0000 mg | ORAL_TABLET | ORAL | Status: DC | PRN
  Administered 2013-05-23 (×2): 10 mg via ORAL
  Filled 2013-05-22 (×2): qty 2

## 2013-05-22 MED ORDER — SODIUM CHLORIDE 0.9 % IV SOLN
INTRAVENOUS | Status: DC
  Administered 2013-05-22: 09:00:00 via INTRAVENOUS

## 2013-05-22 MED ORDER — POLYETHYLENE GLYCOL 3350 17 G PO PACK: 17.0000 g | PACK | Freq: Every day | ORAL | Status: DC | PRN

## 2013-05-22 MED ORDER — ALUMINUM HYDROXIDE GEL 320 MG/5ML PO SUSP
15.0000 mL | ORAL | Status: DC | PRN
  Filled 2013-05-22: qty 30

## 2013-05-22 MED ORDER — FLEET ENEMA 7-19 GM/118ML RE ENEM: 1.0000 | ENEMA | Freq: Once | RECTAL | Status: AC | PRN

## 2013-05-22 MED ORDER — PHENOL 1.4 % MT LIQD: 1.0000 | OROMUCOSAL | Status: DC | PRN

## 2013-05-22 MED ORDER — TRANYLCYPROMINE SULFATE 10 MG PO TABS: 10.0000 mg | ORAL_TABLET | Freq: Two times a day (BID) | ORAL | Status: DC

## 2013-05-22 MED ORDER — DIPHENHYDRAMINE HCL 12.5 MG/5ML PO ELIX: 12.5000 mg | ORAL_SOLUTION | ORAL | Status: DC | PRN

## 2013-05-22 MED ORDER — FENTANYL CITRATE 0.05 MG/ML IJ SOLN
INTRAMUSCULAR | Status: AC
  Administered 2013-05-22: 50 ug via INTRAVENOUS
  Filled 2013-05-22: qty 2

## 2013-05-22 MED ORDER — METOCLOPRAMIDE HCL 5 MG/ML IJ SOLN: 5.0000 mg | Freq: Three times a day (TID) | INTRAMUSCULAR | Status: DC | PRN

## 2013-05-22 MED ORDER — OXYCODONE-ACETAMINOPHEN 5-325 MG PO TABS
1.0000 | ORAL_TABLET | ORAL | Status: DC | PRN
  Administered 2013-05-23 (×3): 2 via ORAL
  Filled 2013-05-22 (×3): qty 2

## 2013-05-22 MED ORDER — FUROSEMIDE 40 MG PO TABS
40.0000 mg | ORAL_TABLET | Freq: Every day | ORAL | Status: DC
  Filled 2013-05-22 (×2): qty 1

## 2013-05-22 MED ORDER — OXYCODONE HCL 5 MG/5ML PO SOLN: 5.0000 mg | Freq: Once | ORAL | Status: DC | PRN

## 2013-05-22 MED ORDER — 0.9 % SODIUM CHLORIDE (POUR BTL) OPTIME
TOPICAL | Status: DC | PRN
  Administered 2013-05-22: 1000 mL

## 2013-05-22 MED ORDER — MIDAZOLAM HCL 2 MG/2ML IJ SOLN
INTRAMUSCULAR | Status: AC
  Administered 2013-05-22: 1 mg
  Filled 2013-05-22: qty 2

## 2013-05-22 MED ORDER — PROPOFOL 10 MG/ML IV BOLUS
INTRAVENOUS | Status: AC
  Filled 2013-05-22: qty 20

## 2013-05-22 MED ORDER — CHLORHEXIDINE GLUCONATE 4 % EX LIQD
60.0000 mL | Freq: Once | CUTANEOUS | Status: DC
  Filled 2013-05-22: qty 60

## 2013-05-22 MED ORDER — BISACODYL 10 MG RE SUPP: 10.0000 mg | Freq: Every day | RECTAL | Status: DC | PRN

## 2013-05-22 MED ORDER — CEFAZOLIN SODIUM-DEXTROSE 2-3 GM-% IV SOLR: 2.0000 g | INTRAVENOUS | Status: DC

## 2013-05-22 MED ORDER — ARTIFICIAL TEARS OP OINT
TOPICAL_OINTMENT | OPHTHALMIC | Status: AC
  Filled 2013-05-22: qty 3.5

## 2013-05-22 MED ORDER — DEXTROSE 5 % IV SOLN
10.0000 mg | INTRAVENOUS | Status: DC | PRN
  Administered 2013-05-22: 20 ug/min via INTRAVENOUS

## 2013-05-22 MED ORDER — ACETAMINOPHEN 325 MG PO TABS: 650.0000 mg | ORAL_TABLET | Freq: Four times a day (QID) | ORAL | Status: DC | PRN

## 2013-05-22 MED ORDER — BUPIVACAINE-EPINEPHRINE PF 0.5-1:200000 % IJ SOLN
INTRAMUSCULAR | Status: DC | PRN
  Administered 2013-05-22: 150 mg via PERINEURAL

## 2013-05-22 MED ORDER — ONDANSETRON HCL 4 MG/2ML IJ SOLN
INTRAMUSCULAR | Status: AC
  Filled 2013-05-22: qty 2

## 2013-05-22 MED ORDER — ARIPIPRAZOLE 5 MG PO TABS
5.0000 mg | ORAL_TABLET | Freq: Every day | ORAL | Status: DC
  Administered 2013-05-23: 5 mg via ORAL
  Filled 2013-05-22: qty 1

## 2013-05-22 MED ORDER — DEXAMETHASONE SODIUM PHOSPHATE 10 MG/ML IJ SOLN
INTRAMUSCULAR | Status: DC | PRN
  Administered 2013-05-22: 8 mg

## 2013-05-22 MED ORDER — MORPHINE SULFATE 2 MG/ML IJ SOLN
1.0000 mg | INTRAMUSCULAR | Status: DC | PRN
  Administered 2013-05-23: 1 mg via INTRAVENOUS
  Filled 2013-05-22: qty 1

## 2013-05-22 MED ORDER — ROCURONIUM BROMIDE 50 MG/5ML IV SOLN
INTRAVENOUS | Status: AC
  Filled 2013-05-22: qty 1

## 2013-05-22 MED ORDER — PROPOFOL 10 MG/ML IV BOLUS
INTRAVENOUS | Status: DC | PRN
  Administered 2013-05-22: 200 mg via INTRAVENOUS

## 2013-05-22 MED ORDER — SODIUM CHLORIDE 0.9 % IV SOLN
INTRAVENOUS | Status: DC
  Administered 2013-05-22: 19:00:00 via INTRAVENOUS

## 2013-05-22 MED ORDER — OXYCODONE HCL 5 MG PO TABS: 5.0000 mg | ORAL_TABLET | Freq: Once | ORAL | Status: DC | PRN

## 2013-05-22 MED ORDER — ASPIRIN EC 325 MG PO TBEC
325.0000 mg | DELAYED_RELEASE_TABLET | Freq: Two times a day (BID) | ORAL | Status: DC
  Administered 2013-05-22 – 2013-05-23 (×2): 325 mg via ORAL
  Filled 2013-05-22 (×4): qty 1

## 2013-05-22 MED ORDER — FENTANYL CITRATE 0.05 MG/ML IJ SOLN
INTRAMUSCULAR | Status: AC
  Filled 2013-05-22: qty 5

## 2013-05-22 MED ORDER — HYDROCODONE-ACETAMINOPHEN 5-325 MG PO TABS: 1.0000 | ORAL_TABLET | ORAL | Status: DC | PRN

## 2013-05-22 MED ORDER — VANCOMYCIN HCL IN DEXTROSE 1-5 GM/200ML-% IV SOLN
1000.0000 mg | INTRAVENOUS | Status: DC
Start: 2013-05-22 — End: 2013-05-22
  Filled 2013-05-22: qty 200

## 2013-05-22 MED ORDER — VECURONIUM BROMIDE 10 MG IV SOLR
INTRAVENOUS | Status: DC | PRN
  Administered 2013-05-22: 6 mg via INTRAVENOUS

## 2013-05-22 MED ORDER — NEOSTIGMINE METHYLSULFATE 1 MG/ML IJ SOLN
INTRAMUSCULAR | Status: DC | PRN
Start: 2013-05-22 — End: 2013-05-22
  Administered 2013-05-22: 5 mg via INTRAVENOUS

## 2013-05-22 MED ORDER — SODIUM CHLORIDE 0.9 % IV SOLN
INTRAVENOUS | Status: DC | PRN
  Administered 2013-05-22 (×2): via INTRAVENOUS

## 2013-05-22 MED ORDER — METOCLOPRAMIDE HCL 10 MG PO TABS: 5.0000 mg | ORAL_TABLET | Freq: Three times a day (TID) | ORAL | Status: DC | PRN

## 2013-05-22 SURGICAL SUPPLY — 81 items
BIT DRILL HEX (BIT) IMPLANT
BLADE SAW SAG 73X25 THK (BLADE) ×1
BLADE SAW SGTL 73X25 THK (BLADE) ×1 IMPLANT
BLADE SURG 15 STRL LF DISP TIS (BLADE) ×1 IMPLANT
BLADE SURG 15 STRL SS (BLADE) ×2
BOWL SMART MIX CTS (DISPOSABLE) IMPLANT
BSPLAT GLND 25 SHLDR AQL-R II (Plate) ×1 IMPLANT
CEMENT HV SMART SET (Cement) ×1 IMPLANT
CEMENT RESTRICTOR 8-15 (Cement) ×1 IMPLANT
CHLORAPREP W/TINT 26ML (MISCELLANEOUS) ×2 IMPLANT
CLSR STERI-STRIP ANTIMIC 1/2X4 (GAUZE/BANDAGES/DRESSINGS) ×1 IMPLANT
COVER SURGICAL LIGHT HANDLE (MISCELLANEOUS) ×2 IMPLANT
DRAPE INCISE IOBAN 66X45 STRL (DRAPES) ×4 IMPLANT
DRAPE SURG 17X23 STRL (DRAPES) ×2 IMPLANT
DRAPE U-SHAPE 47X51 STRL (DRAPES) ×2 IMPLANT
DRILL BIT 5/64 (BIT) ×2 IMPLANT
DRILL BIT HEX (BIT) ×2
DRSG MEPILEX BORDER 4X4 (GAUZE/BANDAGES/DRESSINGS) ×1 IMPLANT
DRSG MEPILEX BORDER 4X8 (GAUZE/BANDAGES/DRESSINGS) ×1 IMPLANT
ELECT BLADE 4.0 EZ CLEAN MEGAD (MISCELLANEOUS) ×2
ELECT REM PT RETURN 9FT ADLT (ELECTROSURGICAL) ×2
ELECTRODE BLDE 4.0 EZ CLN MEGD (MISCELLANEOUS) ×1 IMPLANT
ELECTRODE REM PT RTRN 9FT ADLT (ELECTROSURGICAL) ×1 IMPLANT
EVACUATOR 1/8 PVC DRAIN (DRAIN) ×2 IMPLANT
GLOVE BIO SURGEON STRL SZ7 (GLOVE) ×4 IMPLANT
GLOVE BIO SURGEON STRL SZ7.5 (GLOVE) ×2 IMPLANT
GLOVE BIOGEL PI IND STRL 7.0 (GLOVE) ×1 IMPLANT
GLOVE BIOGEL PI IND STRL 8 (GLOVE) ×1 IMPLANT
GLOVE BIOGEL PI INDICATOR 7.0 (GLOVE) ×1
GLOVE BIOGEL PI INDICATOR 8 (GLOVE) ×1
GOWN PREVENTION PLUS LG XLONG (DISPOSABLE) ×2 IMPLANT
GOWN PREVENTION PLUS XLARGE (GOWN DISPOSABLE) ×2 IMPLANT
GOWN STRL NON-REIN LRG LVL3 (GOWN DISPOSABLE) ×4 IMPLANT
HANDPIECE INTERPULSE COAX TIP (DISPOSABLE) ×2
HOOD PEEL AWAY FACE SHEILD DIS (HOOD) ×4 IMPLANT
INSERT HUMERAL LATERALIZED (Shoulder) ×1 IMPLANT
KIT BASIN OR (CUSTOM PROCEDURE TRAY) ×2 IMPLANT
KIT ROOM TURNOVER OR (KITS) ×2 IMPLANT
MANIFOLD NEPTUNE II (INSTRUMENTS) ×2 IMPLANT
NDL HYPO 25GX1X1/2 BEV (NEEDLE) IMPLANT
NDL MAYO TROCAR (NEEDLE) IMPLANT
NEEDLE HYPO 25GX1X1/2 BEV (NEEDLE) IMPLANT
NEEDLE MAYO TROCAR (NEEDLE) ×2 IMPLANT
NOZZLE PRISM 8.5MM (MISCELLANEOUS) IMPLANT
NS IRRIG 1000ML POUR BTL (IV SOLUTION) ×2 IMPLANT
PACK SHOULDER (CUSTOM PROCEDURE TRAY) ×2 IMPLANT
PAD ARMBOARD 7.5X6 YLW CONV (MISCELLANEOUS) ×4 IMPLANT
PIN GUIDE 2.5X200 (PIN) ×1 IMPLANT
PLATE BASE 25MM (Plate) ×1 IMPLANT
RETRIEVER SUT HEWSON (MISCELLANEOUS) ×2 IMPLANT
SCREW COMPRESSION 4.5X18 (Screw) ×2 IMPLANT
SCREW LOCKING 29X4.5 (Screw) ×1 IMPLANT
SCREW LOCKING 4.5X41 (Screw) ×1 IMPLANT
SET HNDPC FAN SPRY TIP SCT (DISPOSABLE) ×1 IMPLANT
SLING ARM IMMOBILIZER LRG (SOFTGOODS) ×2 IMPLANT
SLING ARM IMMOBILIZER MED (SOFTGOODS) IMPLANT
SPHERE GLENOID 42X25MM (Shoulder) ×1 IMPLANT
SPONGE LAP 18X18 X RAY DECT (DISPOSABLE) ×2 IMPLANT
SPONGE LAP 4X18 X RAY DECT (DISPOSABLE) ×4 IMPLANT
STEM HUMERAL FRACTURE 11X130 (Stem) ×1 IMPLANT
STRIP CLOSURE SKIN 1/2X4 (GAUZE/BANDAGES/DRESSINGS) ×2 IMPLANT
SUCTION FRAZIER TIP 10 FR DISP (SUCTIONS) ×2 IMPLANT
SUPPORT WRAP ARM LG (MISCELLANEOUS) ×2 IMPLANT
SUT ETHIBOND 2 OS 4 DA (SUTURE) ×4 IMPLANT
SUT ETHIBOND NAB CT1 #1 30IN (SUTURE) ×1 IMPLANT
SUT FIBERWIRE #2 38 REV NDL BL (SUTURE) ×4
SUT FIBERWIRE #2 38 T-5 BLUE (SUTURE) ×8
SUT MNCRL AB 4-0 PS2 18 (SUTURE) ×2 IMPLANT
SUT SILK 2 0 TIES 17X18 (SUTURE)
SUT SILK 2-0 18XBRD TIE BLK (SUTURE) IMPLANT
SUT VIC AB 0 CTB1 27 (SUTURE) ×2 IMPLANT
SUT VIC AB 2-0 CT1 27 (SUTURE) ×6
SUT VIC AB 2-0 CT1 TAPERPNT 27 (SUTURE) ×1 IMPLANT
SUTURE FIBERWR #2 38 T-5 BLUE (SUTURE) IMPLANT
SUTURE FIBERWR#2 38 REV NDL BL (SUTURE) IMPLANT
SYR CONTROL 10ML LL (SYRINGE) IMPLANT
SYRINGE TOOMEY DISP (SYRINGE) IMPLANT
TOWEL OR 17X24 6PK STRL BLUE (TOWEL DISPOSABLE) ×2 IMPLANT
TOWEL OR 17X26 10 PK STRL BLUE (TOWEL DISPOSABLE) ×2 IMPLANT
WATER STERILE IRR 1000ML POUR (IV SOLUTION) ×2 IMPLANT
YANKAUER SUCT BULB TIP NO VENT (SUCTIONS) ×2 IMPLANT

## 2013-05-22 NOTE — Anesthesia Postprocedure Evaluation (Signed)
Anesthesia Post Note  Patient: [name]  Procedure(s) Performed: Procedure(s) (LRB): REVERSE SHOULDER ARTHROPLASTY (Right)  Anesthesia type: general  Patient location: PACU  Post pain: Pain level controlled  Post assessment: Patient's Cardiovascular Status Stable  Last Vitals:  Filed Vitals:   05/22/13 1430  BP: 111/56  Pulse:   Temp: 36.4 C  Resp: 14    Post vital signs: Reviewed and stable  Level of consciousness: sedated  Complications: No apparent anesthesia complications

## 2013-05-22 NOTE — Preoperative (Signed)
Beta Blockers   Reason not to administer Beta Blockers:NA

## 2013-05-22 NOTE — H&P (Signed)
[name] is an 73 y.o. male.   Chief Complaint: R shoulder pain and dysfunction HPI: S/p fall with R comminuted proximal humerus fracture.  Past Medical History  Diagnosis Date  . Bipolar 1 disorder     takes Abilify daily  . Depression     takes Parnate daily  . Sleep apnea     doesn't use CPAP just nasal canula-report in epic  . Headache(784.0)     occasionally  . Swelling     right hand  . Joint pain   . Joint swelling   . Back pain     reason unknown  . History of colon polyps   . Peripheral edema     takes Lasix daily   . Osteoporosis     takes Drisdol weekly and Caltrate daily  . CKD (chronic kidney disease)     possibly related to chronic lithium exposure and resultant tubular interstitial nephritis (Dr. Erling Cruz)    Past Surgical History  Procedure Laterality Date  . Colonoscopy w/ polypectomy  09/07/2002  . Tonsillectomy    . Knee surgery Right     Family History  Problem Relation Age of Onset  . Cancer Mother   . Stroke Father    Social History:  reports that he has never smoked. He does not have any smokeless tobacco history on file. He reports that he does not drink alcohol or use illicit drugs.  Allergies: No Known Allergies  Medications Prior to Admission  Medication Dose Route Frequency Provider Last Rate Last Dose  . cyanocobalamin ((VITAMIN B-12)) injection 1,000 mcg  1,000 mcg Intramuscular Q30 days Ricard Dillon, MD   1,000 mcg at 01/20/11 1039   Medications Prior to Admission  Medication Sig Dispense Refill  . ARIPiprazole (ABILIFY) 5 MG tablet Take 1 tablet (5 mg total) by mouth daily.  90 tablet  3  . Calcium-Vitamin D (CALTRATE 600 PLUS-VIT D PO) Take 1 tablet by mouth 2 (two) times daily.       . furosemide (LASIX) 20 MG tablet Take 40 mg by mouth daily.       Marland Kitchen tranylcypromine (PARNATE) 10 MG tablet Take 10 mg by mouth. 2 in am and 3 at bedtime       . Vitamin D, Ergocalciferol, (DRISDOL) 50000 UNITS CAPS capsule take 1  capsule by mouth every week  4 capsule  1    Results for orders placed during the hospital encounter of 05/21/13 (from the past 48 hour(s))  BASIC METABOLIC PANEL     Status: Abnormal   Collection Time    05/21/13 12:28 PM      Result Value Ref Range   Sodium 139  137 - 147 mEq/L   Potassium 4.2  3.7 - 5.3 mEq/L   Chloride 101  96 - 112 mEq/L   CO2 26  19 - 32 mEq/L   Glucose, Bld 97  70 - 99 mg/dL   BUN 42 (*) 6 - 23 mg/dL   Creatinine, Ser 2.25 (*) 0.50 - 1.35 mg/dL   Calcium 9.6  8.4 - 10.5 mg/dL   GFR calc non Af Amer 27 (*) >90 mL/min   GFR calc Af Amer 32 (*) >90 mL/min   Comment: (NOTE)     The eGFR has been calculated using the CKD EPI equation.     This calculation has not been validated in all clinical situations.     eGFR's persistently <90 mL/min signify possible Chronic Kidney  Disease.  CBC     Status: Abnormal   Collection Time    05/21/13 12:28 PM      Result Value Ref Range   WBC 9.3  4.0 - 10.5 K/uL   RBC 3.63 (*) 4.22 - 5.81 MIL/uL   Hemoglobin 11.3 (*) 13.0 - 17.0 g/dL   HCT 33.8 (*) 39.0 - 52.0 %   MCV 93.1  78.0 - 100.0 fL   MCH 31.1  26.0 - 34.0 pg   MCHC 33.4  30.0 - 36.0 g/dL   RDW 12.7  11.5 - 15.5 %   Platelets 242  150 - 400 K/uL  PROTIME-INR     Status: None   Collection Time    05/21/13 12:28 PM      Result Value Ref Range   Prothrombin Time 12.6  11.6 - 15.2 seconds   INR 0.96  0.00 - 1.49  APTT     Status: None   Collection Time    05/21/13 12:28 PM      Result Value Ref Range   aPTT 35  24 - 37 seconds  TYPE AND SCREEN     Status: None   Collection Time    05/21/13 12:45 PM      Result Value Ref Range   ABO/RH(D) A POS     Antibody Screen NEG     Sample Expiration 06/04/2013    ABO/RH     Status: None   Collection Time    05/21/13 12:45 PM      Result Value Ref Range   ABO/RH(D) A POS     Dg Chest 1 View  05/21/2013   CLINICAL DATA:  peripheral edema  EXAM: CHEST - 1 VIEW  COMPARISON:  None.  FINDINGS: Low lung volumes.  The heart size and mediastinal contours are within normal limits. Both lungs are clear. The visualized skeletal structures are unremarkable. Calcified granuloma left upper lobe. Areas of discoid atelectasis project in the lingula and right costophrenic angle regions.  IMPRESSION: No active disease.   Electronically Signed   By: Margaree Mackintosh M.D.   On: 05/21/2013 13:17    Review of Systems  All other systems reviewed and are negative.    Blood pressure 148/76, pulse 84, temperature 98 F (36.7 C), temperature source Oral, resp. rate 18, SpO2 92.00%. Physical Exam  Constitutional: He is oriented to person, place, and time. He appears well-developed and well-nourished.  HENT:  Head: Atraumatic.  Eyes: EOM are normal.  Cardiovascular: Intact distal pulses.   Respiratory: Effort normal.  Musculoskeletal:  RUE swollen, ecchymotic, pain with ROM.  NVID.   Neurological: He is alert and oriented to person, place, and time.  Skin: Skin is warm and dry.  Psychiatric: He has a normal mood and affect.     Assessment/Plan R comminuted proximal humerus fracture Plan R reverse TSA Risks / benefits of surgery discussed Consent on chart  NPO for OR Preop antibiotics   [name] 05/22/2013, 9:18 AM

## 2013-05-22 NOTE — Transfer of Care (Signed)
Immediate Anesthesia Transfer of Care Note  Patient: [name]  Procedure(s) Performed: Procedure(s) with comments: REVERSE SHOULDER ARTHROPLASTY (Right) - Right reverse total shoulder  Patient Location: PACU  Anesthesia Type:GA combined with regional for post-op pain  Level of Consciousness: awake, alert  and oriented  Airway & Oxygen Therapy: Patient Spontanous Breathing and Patient connected to nasal cannula oxygen  Post-op Assessment: Report given to PACU RN and Post -op Vital signs reviewed and stable  Post vital signs: Reviewed and stable  Complications: No apparent anesthesia complications

## 2013-05-22 NOTE — Op Note (Signed)
Procedure(s): REVERSE SHOULDER ARTHROPLASTY Procedure Note  [name] male 73 y.o. 05/22/2013  Procedure(s) and Anesthesia Type:     RIGHT REVERSE TOTAL SHOULDER ARTHROPLASTY for fracture-   Indications:  73 y.o. male  status post fall with right comminuted four-part proximal humerus fracture. Indicated for reverse total shoulder replacement to try and decrease pain and restore function. He understood risks benefits and alternatives to the procedure including but not limited to risk of bleeding infection damage to neurovascular structures, stiffness, nonhealing, instability and the potential need for future surgery. He wished to go forward with surgery.     Surgeon: Nita Sells   Assistants: Jeanmarie Hubert PA-C [facility] was present and scrubbed throughout the procedure and was essential in positioning, retraction, exposure, and closure)  Anesthesia: General endotracheal anesthesia with preoperative interscalene block given by the attending anesthesiologist    Procedure Detail  REVERSE SHOULDER ARTHROPLASTY   Estimated Blood Loss:  300 mL         Drains: 1 medium hemovac  Blood Given: none          Specimens: none        Complications:  * No complications entered in OR log *         Disposition: PACU - hemodynamically stable.         Condition: stable      OPERATIVE FINDINGS:  Tornier fracture stem with a 42 glenoid sphere, size 11 stem cemented, size 6 poly-implant. Excellent stability. Tuberosities were reconstructed around the implant.  PROCEDURE: The patient was identified in the preoperative holding area  where I personally marked the operative site after verifying site, side,  and procedure with the patient. An interscalene block given by  the attending anesthesiologist in the holding area and the patient was taken back to the operating room where all extremities were  carefully padded in position after general anesthesia was induced. She   was placed in a beach-chair position and the operative upper extremity was  prepped and draped in a standard sterile fashion. An approximately 15-  cm incision was made from the tip of the coracoid process to the center  point of the humerus at the level of the axilla. Dissection was carried  down through subcutaneous tissues to the level of the cephalic vein  which was taken laterally with the deltoid. The pectoralis major was  retracted medially. The subdeltoid space was developed and the lateral  edge of the conjoined tendon was identified. The undersurface of  conjoined tendon was palpated and the musculocutaneous nerve was not in  the field. Retractor was placed underneath the conjoined and second  retractor was placed lateral into the deltoid. The circumflex humeral  artery and vessels were identified and clamped and coagulated. The  biceps tendon was tenotomized and tenodesed to the upper border of the subscapularis.   Fracture in the bicipital groove was identified and carefully developed with a Cobb elevator. The lesser tuberosity and subscapularis were mobilized and tagged with a suture. The greater tuberosity was then elevated posteriorly exposing the fracture humeral head. Suture control was obtained of the greater tuberosity through the posterior rotator cuff. Cobb elevator was used to free up the impacted humeral head segment which was then excised and used for bone graft. #2 FiberWire sutures were placed through the superior and inferior bone tendon junction of the greater tuberosity. The glenoid was then again exposed and the labrum was excised circumferentially. The capsule was released anterior and posterior. No glenoid injury was  noted. The guidepin was then placed and the cannulated reamer was used to ream the glenoid down to punctate bleeding surface. The central drill was then used and did not exit the glenoid vault. The 25 mm base plate was then impacted with an excellent  press-fit. 2 locking and 2 nonlocking screws were then placed. Bone quality was good. The 42 mm glenoid see her was then impacted on the [facility] taper and tightened into position. The proximal humerus was then exposed and was felt to be best incised with an 11 mm reamer. A size 11 stem trial was then placed, with a 6 poly-trial insert. This was reduced and taken through a range of motion. Stability was excellent and soft tissue tension was appropriate. The joint was dislocated and the final implant was cemented in place. The size 6 trial was again used and was felt to be the appropriate size implant. The final poly-implant was placed and impacted. The tuberosities were then reconstructed around the proximal implant using bone graft from the humeral head and multiple FiberWire sutures including a horizontal and vertical tension band configuration. The joint and the wounds were copiously irrigated. A medium Hemovac drain was placed out the deltoid insertion and the wound was then closed in layers with 2-0 Vicryl and 4-0 Monocryl. Steri-Strips and a light sterile dressing were applied.  The patient was allowed  to awaken from general anesthesia, transferred to stretcher, and taken  to recovery room in stable condition in a sling.   POSTOPERATIVE PLAN: The patient will be kept in the hospital postoperatively  for pain control, antibiotics and therapy and will likely be discharged home tomorrow.

## 2013-05-22 NOTE — Anesthesia Procedure Notes (Signed)
Anesthesia Regional Block:  Interscalene brachial plexus block  Pre-Anesthetic Checklist: ,, timeout performed, Correct Patient, Correct Site, Correct Laterality, Correct Procedure, Correct Position, site marked, Risks and benefits discussed,  Surgical consent,  Pre-op evaluation,  At surgeon's request and post-op pain management  Laterality: Right  Prep: chloraprep       Needles:  Injection technique: Single-shot  Needle Type: Echogenic Stimulator Needle     Needle Length: 5cm 5 cm Needle Gauge: 22 and 22 G    Additional Needles:  Procedures: ultrasound guided (picture in chart) and nerve stimulator Interscalene brachial plexus block  Nerve Stimulator or Paresthesia:  Response: bicep contraction, 0.45 mA,   Additional Responses:   Narrative:  Start time: 05/22/2013 9:43 AM End time: 05/22/2013 9:53 AM Injection made incrementally with aspirations every 5 mL.  Performed by: Personally  Anesthesiologist: J. Tamela Gammon, MD  Additional Notes: Functioning IV was confirmed and monitors applied.  A 3mm 22ga echogenic arrow stimulator was used. Sterile prep and drape,hand hygiene and sterile gloves were used.Ultrasound guidance: relevant anatomy identified, needle position confirmed, local anesthetic spread visualized around nerve(s)., vascular puncture avoided.  Image printed for medical record.  Negative aspiration and negative test dose prior to incremental administration of local anesthetic. The patient tolerated the procedure well.

## 2013-05-22 NOTE — Progress Notes (Signed)
Utilization review completed.  

## 2013-05-22 NOTE — Discharge Instructions (Signed)
Discharge Instructions after Reverse Total Shoulder Arthroplasty ° ° °A sling has been provided for you. You are to where this at all times, even while sleeping, until your first post operative visit with Dr. Chandler. °Use ice on the shoulder intermittently over the first 48 hours after surgery.  °Pain medicine has been prescribed for you.  °Use your medicine liberally over the first 48 hours, and then you can begin to taper your use. You may take Extra Strength Tylenol or Tylenol only in place of the pain pills. DO NOT take ANY nonsteroidal anti-inflammatory pain medications: Advil, Motrin, Ibuprofen, Aleve, Naproxen or Naprosyn.  °Take one aspirin a day for 2 weeks after surgery, unless you have an aspirin sensitivity/allergy or asthma.  °You may remove your dressing after two days  °You may shower 5 days after surgery. The incisions CANNOT get wet prior to 5 days. Simply allow the water to wash over the site and then pat dry. Do not rub the incisions. Make sure your axilla (armpit) is completely dry after showering. ° ° ° °Please call [phone] during normal business hours or [phone] after hours for any problems. Including the following: ° °- excessive redness of the incisions °- drainage for more than 4 days °- fever of more than 101.5 F ° °*Please note that pain medications will not be refilled after hours or on weekends. ° ° ° ° °

## 2013-05-23 LAB — CBC
HCT: 28.7 % — ABNORMAL LOW (ref 39.0–52.0)
Hemoglobin: 9.6 g/dL — ABNORMAL LOW (ref 13.0–17.0)
MCH: 30.9 pg (ref 26.0–34.0)
MCHC: 33.4 g/dL (ref 30.0–36.0)
MCV: 92.3 fL (ref 78.0–100.0)
PLATELETS: 239 10*3/uL (ref 150–400)
RBC: 3.11 MIL/uL — AB (ref 4.22–5.81)
RDW: 12.6 % (ref 11.5–15.5)
WBC: 12.2 10*3/uL — ABNORMAL HIGH (ref 4.0–10.5)

## 2013-05-23 LAB — BASIC METABOLIC PANEL
BUN: 44 mg/dL — ABNORMAL HIGH (ref 6–23)
CALCIUM: 8.7 mg/dL (ref 8.4–10.5)
CO2: 24 meq/L (ref 19–32)
Chloride: 105 mEq/L (ref 96–112)
Creatinine, Ser: 1.97 mg/dL — ABNORMAL HIGH (ref 0.50–1.35)
GFR calc Af Amer: 37 mL/min — ABNORMAL LOW (ref >90)
GFR, EST NON AFRICAN AMERICAN: 32 mL/min — AB (ref >90)
Glucose, Bld: 114 mg/dL — ABNORMAL HIGH (ref 70–99)
Potassium: 4.8 mEq/L (ref 3.7–5.3)
Sodium: 142 mEq/L (ref 137–147)

## 2013-05-23 MED ORDER — MORPHINE SULFATE 2 MG/ML IJ SOLN
2.0000 mg | Freq: Once | INTRAMUSCULAR | Status: AC
  Administered 2013-05-23: 2 mg via INTRAVENOUS
  Filled 2013-05-23: qty 1

## 2013-05-23 MED ORDER — OXYCODONE-ACETAMINOPHEN 5-325 MG PO TABS: 1.0000 | ORAL_TABLET | ORAL | Status: DC | PRN

## 2013-05-23 MED ORDER — MORPHINE SULFATE 2 MG/ML IJ SOLN: 1.0000 mg | INTRAMUSCULAR | Status: DC | PRN

## 2013-05-23 MED ORDER — DOCUSATE SODIUM 100 MG PO CAPS: 100.0000 mg | ORAL_CAPSULE | Freq: Three times a day (TID) | ORAL | Status: DC | PRN

## 2013-05-23 NOTE — Evaluation (Signed)
Occupational Therapy Evaluation Patient Details Name: [name] [name] DOB: May 08, 1940 Today's Date: 05/23/2013 Time: [id number] OT Time Calculation (min): 16 min  OT Assessment / Plan / Recommendation History of present illness 73 yo male s/p RT reverse TSA    Clinical Impression   Patient is s/p Rt reverse TSA surgery resulting in functional limitations due to the deficits listed below (see OT problem list).  Patient will benefit from skilled OT acutely to increase independence and safety with ADLS to allow discharge follow up at outpatient as MD recommends s/p next visit. Ot to visit patient with multiple mini sessions today due to pain level at this time. Pt's pain in not in control at this time limiting ability to progress with therapy     OT Assessment  Patient needs continued OT Services    Follow Up Recommendations  Outpatient OT (follow as MD recommends )    Barriers to Discharge      Equipment Recommendations  3 in 1 bedside comode    Recommendations for Other Services    Frequency  Min 2X/week    Precautions / Restrictions Precautions Precautions: Shoulder Type of Shoulder Precautions: Rt reverse TSA by Tamera Punt Shoulder Interventions: Shoulder sling/immobilizer;At all times;Off for dressing/bathing/exercises Precaution Comments: handout provided Required Braces or Orthoses: Sling Restrictions Weight Bearing Restrictions: Yes RUE Weight Bearing: Non weight bearing   Pertinent Vitals/Pain 9 out 10 pain Edema noted throughout Rt UE    ADL  Eating/Feeding: Set up Where Assessed - Eating/Feeding: Chair Grooming: Wash/dry face;Set up Where Assessed - Grooming: Supported sitting ADL Comments: Ot to attempt multiple mini sessions due to patients activity tolerance with pain at this time. RN called to room to address pain needs. Pt supine on arrival and agreeable to OOB to chair. Pt educated on hand , wrist and elbow ROM to decr edema. Pt with poor  tolerance due to pain. Pt with sling correctly positioned and transferred to chair. Pt with ice applied to shoulder. Pt c/o strap pulling on neck so pillow used to support RT UE in proper alignment. OT speaking directly with RN regarding multiple sessions needed and education with girlfriend could be benefiical.    OT Diagnosis: Generalized weakness;Acute pain  OT Problem List: Decreased strength;Decreased activity tolerance;Impaired balance (sitting and/or standing);Decreased safety awareness;Decreased knowledge of use of DME or AE;Impaired UE functional use;Pain;Increased edema OT Treatment Interventions: Self-care/ADL training;Therapeutic exercise;DME and/or AE instruction;Therapeutic activities;Balance training;Patient/family education   OT Goals(Current goals can be found in the care plan section) Acute Rehab OT Goals Patient Stated Goal: to go home today with girlfriend OT Goal Formulation: With patient Time For Goal Achievement: 06/06/13 Potential to Achieve Goals: Good  Visit Information  Last OT Received On: 05/23/13 Assistance Needed: +1 History of Present Illness: 73 yo male s/p RT reverse TSA        Prior [place] expects to be discharged to:: Private residence Living Arrangements: Spouse/significant other (girlfriend) Available Help at Discharge: Available PRN/intermittently (girlfriend) Type of Home: House Home Access: Stairs to enter Technical brewer of Steps: 3 Home Layout: One [place]: None Prior Function Level of Independence: Independent Communication Communication: No difficulties Dominant Hand: Right         Vision/Perception Vision - History Baseline Vision: Wears glasses all the time Patient Visual Report: No change from baseline Vision - Assessment Vision Assessment: Vision not tested Perception Perception: Within Functional Limits Praxis Praxis: Intact   Cognition   Cognition Arousal/Alertness: Awake/alert  Behavior During Therapy: WFL for tasks assessed/performed Overall Cognitive Status: Within Functional Limits for tasks assessed    Extremity/Trunk Assessment Upper Extremity Assessment Upper Extremity Assessment: RUE deficits/detail RUE Deficits / Details: s/p surg Lower Extremity Assessment Lower Extremity Assessment: Overall WFL for tasks assessed Cervical / Trunk Assessment Cervical / Trunk Assessment: Kyphotic     Mobility Bed Mobility Overal bed mobility: Needs Assistance Bed Mobility: Supine to Sit Supine to sit: Supervision General bed mobility comments: pt with hOb flatten and bed rails removed. Pt able to complete bed mobility and sleeps on the same side practiced this session Transfers Overall transfer level: Needs assistance Equipment used: 1 person hand held assist Transfers: Sit to/from Stand Sit to Stand: Min guard General transfer comment: pt reaching for environmental supports. First time up.      Exercise Donning/doffing shirt without moving shoulder: Maximal assistance Correct positioning of sling/immobilizer: Maximal assistance Pendulum exercises (written home exercise program):  (NA) ROM for elbow, wrist and digits of operated UE: Minimal assistance Sling wearing schedule (on at all times/off for ADL's): Supervision/safety   Balance     End of Session OT - End of Session Activity Tolerance: Patient limited by pain Patient left: in chair;with call bell/phone within reach Nurse Communication: Mobility status;Precautions  GO     Peri Maris 05/23/2013, 9:42 AM Pager: [phone]

## 2013-05-23 NOTE — Discharge Summary (Signed)
Patient ID: [name] [name] DOB/AGE: 74-14-1942 74 y.o.  Admit date: 05/22/2013 Discharge date: 05/23/2013  Admission Diagnoses:  Active Problems:   Proximal humerus fracture   Discharge Diagnoses:  Same  Past Medical History  Diagnosis Date  . Bipolar 1 disorder     takes Abilify daily  . Depression     takes Parnate daily  . Sleep apnea     doesn't use CPAP just nasal canula-report in epic  . Headache(784.0)     occasionally  . Swelling     right hand  . Joint pain   . Joint swelling   . Back pain     reason unknown  . History of colon polyps   . Peripheral edema     takes Lasix daily   . Osteoporosis     takes Drisdol weekly and Caltrate daily  . CKD (chronic kidney disease)     possibly related to chronic lithium exposure and resultant tubular interstitial nephritis (Dr. Erling Cruz)    Surgeries: Procedure(s): REVERSE SHOULDER ARTHROPLASTY on 05/22/2013   Consultants:    Discharged Condition: Improved  Hospital Course: Mikaele Steffek is an 73 y.o. male who was admitted 05/22/2013 for operative treatment of a right comminuted proximal humerus fracture. Patient had a fracture pattern requiring surgical fixation and a reverse total shoulder arthroplasty was determined to allow the best outcome. Had pain that affects sleep, daily activities, and work/hobbies. After pre-op clearance the patient was taken to the operating room on 05/22/2013 and underwent  Procedure(s): [place].    Patient was given perioperative antibiotics: Anti-infectives   Start     Dose/Rate Route Frequency Ordered Stop   05/22/13 1700  ceFAZolin (ANCEF) IVPB 2 g/50 mL premix     2 g 100 mL/hr over 30 Minutes Intravenous Every 6 hours 05/22/13 1501 05/23/13 0558   05/22/13 1315  vancomycin (VANCOCIN) IVPB 1000 mg/200 mL premix  Status:  Discontinued     1,000 mg 200 mL/hr over 60 Minutes Intravenous To Surgery 05/22/13 1308 05/22/13 1437   05/22/13 1000   ceFAZolin (ANCEF) 2-3 GM-% IVPB SOLR    Comments:  Leandrew Koyanagi   : cabinet override      05/22/13 1000 05/22/13 1054   05/22/13 0957  ceFAZolin (ANCEF) IVPB 2 g/50 mL premix  Status:  Discontinued     2 g 100 mL/hr over 30 Minutes Intravenous On call to O.R. 05/22/13 0957 05/22/13 1437       Patient was given sequential compression devices, early ambulation, and ASA 325mg  BID to prevent DVT.  Patient benefited maximally from hospital stay and there were no complications.    Recent vital signs: Patient Vitals for the past 24 hrs:  BP Temp Temp src Pulse Resp SpO2 Height Weight  05/23/13 1412 118/94 mmHg 97.4 F (36.3 C) Oral 91 18 95 % - -  05/23/13 0848 - - - - - - 5' 6.5" (1.689 m) 84.7 kg (186 lb 11.7 oz)  05/23/13 0615 108/70 mmHg 97.8 F (36.6 C) - 64 18 99 % - -  05/22/13 2121 106/65 mmHg 97.8 F (36.6 C) - 65 18 99 % - -     Recent laboratory studies:  Recent Labs  05/21/13 1228 05/22/13 0834 05/23/13 0520  WBC 9.3 7.0 12.2*  HGB 11.3* 11.0* 9.6*  HCT 33.8* 33.1* 28.7*  PLT 242 236 239  NA 139  --  142  K 4.2  --  4.8  CL 101  --  105  CO2 26  --  24  BUN 42*  --  44*  CREATININE 2.25*  --  1.97*  GLUCOSE 97  --  114*  INR 0.96  --   --   CALCIUM 9.6  --  8.7     Discharge Medications:     Medication List         ARIPiprazole 5 MG tablet  Commonly known as:  ABILIFY  Take 1 tablet (5 mg total) by mouth daily.     CALTRATE 600 PLUS-VIT D PO  Take 1 tablet by mouth 2 (two) times daily.     docusate sodium 100 MG capsule  Commonly known as:  COLACE  Take 1 capsule (100 mg total) by mouth 3 (three) times daily as needed.     furosemide 20 MG tablet  Commonly known as:  LASIX  Take 40 mg by mouth daily.     oxyCODONE-acetaminophen 5-325 MG per tablet  Commonly known as:  ROXICET  Take 1-2 tablets by mouth every 4 (four) hours as needed for severe pain.     tranylcypromine 10 MG tablet  Commonly known as:  PARNATE  Take 20-30 mg by mouth 2  (two) times daily. 2 tablets in the morning and 3 tablets in the evening     Vitamin D (Ergocalciferol) 50000 UNITS Caps capsule  Commonly known as:  DRISDOL  take 1 capsule by mouth every week        Diagnostic Studies: Dg Chest 1 View  05/21/2013   CLINICAL DATA:  peripheral edema  EXAM: CHEST - 1 VIEW  COMPARISON:  None.  FINDINGS: Low lung volumes. The heart size and mediastinal contours are within normal limits. Both lungs are clear. The visualized skeletal structures are unremarkable. Calcified granuloma left upper lobe. Areas of discoid atelectasis project in the lingula and right costophrenic angle regions.  IMPRESSION: No active disease.   Electronically Signed   By: Margaree Mackintosh M.D.   On: 05/21/2013 13:17   Dg Shoulder Right Port  05/22/2013   CLINICAL DATA:  Post reverse total shoulder arthroplasty  EXAM: PORTABLE RIGHT SHOULDER - 2+ VIEW  COMPARISON:  None.  FINDINGS: Right total shoulder replacement. There is a fracture involving the proximal humerus with displacement of the greater tuberosity. The alignment of the shoulder joint is satisfactory on this single AP view. AC joint is intact.  IMPRESSION: Right shoulder replacement.  Fracture of the greater tuberosity.   Electronically Signed   By: Franchot Gallo M.D.   On: 05/22/2013 16:22    Disposition: 01-Home or Self Care      Discharge Orders   Future Appointments Provider Department Dept Phone   06/08/2013 11:00 AM Ricard Dillon, MD [facility]   Future Orders Complete By Expires   Call MD / Call 911  As directed    Comments:     If you experience chest pain or shortness of breath, CALL 911 and be transported to the hospital emergency room.  If you develope a fever above 101 F, pus (white drainage) or increased drainage or redness at the wound, or calf pain, call your surgeon's office.   Constipation Prevention  As directed    Comments:     Drink plenty of fluids.  Prune juice may be  helpful.  You may use a stool softener, such as Colace (over the counter) 100 mg twice a day.  Use MiraLax (over the counter) for constipation as needed.  Diet - low sodium heart healthy  As directed    Increase activity slowly as tolerated  As directed       Follow-up Information   Follow up with Nita Sells, MD. Schedule an appointment as soon as possible for a visit in 2 weeks.   Specialty:  Orthopedic Surgery   Contact information:   [place] [address] [phone]        Signed: Grier Mitts 05/23/2013, 4:16 PM

## 2013-05-23 NOTE — Progress Notes (Signed)
PATIENT ID: [name]   1 Day Post-Op Procedure(s) (LRB): REVERSE SHOULDER ARTHROPLASTY (Right)  Subjective: Reports pain RUE. Block wore off around 4am and reports pain is severe. No other complaints or concerns.   Objective:  Filed Vitals:   05/23/13 0615  BP: 108/70  Pulse: 64  Temp: 97.8 F (36.6 C)  Resp: 18     R UE dressing c/d/i Drain removed today R UE mod swelling distally Wiggles fingers, adequate deltoid activation  Distally NVI  Labs:   Recent Labs  05/21/13 1228 05/22/13 0834 05/23/13 0520  HGB 11.3* 11.0* 9.6*   Recent Labs  05/22/13 0834 05/23/13 0520  WBC 7.0 12.2*  RBC 3.58* 3.11*  HCT 33.1* 28.7*  PLT 236 239   Recent Labs  05/21/13 1228 05/23/13 0520  NA 139 142  K 4.2 4.8  CL 101 105  CO2 26 24  BUN 42* 44*  CREATININE 2.25* 1.97*  GLUCOSE 97 114*  CALCIUM 9.6 8.7    Assessment and Plan: Will bolus 2 mg IV morphine x1 this am to help get pain under better control Morphine adjusted 1-2mg  q 2hrs prn severe pain Goal to get pain controlled with oral percocet for discharge Can d/c home today if pain under control, if not, can keep another night Home percocet script in chart OT for hand,wrist, elbow, sling edu only Follow up with Dr.chandler in 2 weeks   VTE proph: ASA 325mg  BID, SCDs

## 2013-05-23 NOTE — Progress Notes (Signed)
Patient discharge teaching given, including activity, diet, follow-up appoints, and medications. Patient verbalized understanding of all discharge instructions. IV access was d/c'd. Vitals are stable. Skin is intact except as charted in most recent assessments. Pt to be escorted out by NT, to be driven home by family.  Seeley Southgate, MBA, BS, RN 

## 2013-05-23 NOTE — Progress Notes (Signed)
Nutrition Brief Note  Noted pt on Parnate (MAOI). Pt was on medication PTA. No need to change diet as hospitalized diet is low in [place]. Please re-consult if additional education is needed.   [place], [place], [place] Pager [phone] After Hours Pager

## 2013-05-25 ENCOUNTER — Encounter (HOSPITAL_COMMUNITY): Payer: Self-pay | Admitting: Orthopedic Surgery

## 2013-06-08 ENCOUNTER — Encounter: Payer: Self-pay | Admitting: Internal Medicine

## 2013-06-08 ENCOUNTER — Ambulatory Visit (INDEPENDENT_AMBULATORY_CARE_PROVIDER_SITE_OTHER): Payer: Medicare Other | Admitting: Internal Medicine

## 2013-06-08 VITALS — BP 122/76 | HR 92 | Temp 97.6°F | Ht 66.5 in | Wt 188.0 lb

## 2013-06-08 DIAGNOSIS — D500 Iron deficiency anemia secondary to blood loss (chronic): Secondary | ICD-10-CM

## 2013-06-08 DIAGNOSIS — I10 Essential (primary) hypertension: Secondary | ICD-10-CM

## 2013-06-08 DIAGNOSIS — N182 Chronic kidney disease, stage 2 (mild): Secondary | ICD-10-CM

## 2013-06-08 DIAGNOSIS — Z23 Encounter for immunization: Secondary | ICD-10-CM

## 2013-06-08 DIAGNOSIS — M549 Dorsalgia, unspecified: Secondary | ICD-10-CM

## 2013-06-08 LAB — CBC WITH DIFFERENTIAL/PLATELET
BASOS PCT: 0.5 % (ref 0.0–3.0)
Basophils Absolute: 0 10*3/uL (ref 0.0–0.1)
EOS PCT: 3.2 % (ref 0.0–5.0)
Eosinophils Absolute: 0.3 10*3/uL (ref 0.0–0.7)
HCT: 31.4 % — ABNORMAL LOW (ref 39.0–52.0)
Hemoglobin: 10.3 g/dL — ABNORMAL LOW (ref 13.0–17.0)
LYMPHS PCT: 15.1 % (ref 12.0–46.0)
Lymphs Abs: 1.5 10*3/uL (ref 0.7–4.0)
MCHC: 32.7 g/dL (ref 30.0–36.0)
MCV: 91.4 fl (ref 78.0–100.0)
Monocytes Absolute: 0.8 10*3/uL (ref 0.1–1.0)
Monocytes Relative: 8 % (ref 3.0–12.0)
Neutro Abs: 7.5 10*3/uL (ref 1.4–7.7)
Neutrophils Relative %: 73.2 % (ref 43.0–77.0)
Platelets: 357 10*3/uL (ref 150.0–400.0)
RBC: 3.43 Mil/uL — ABNORMAL LOW (ref 4.22–5.81)
RDW: 12.9 % (ref 11.5–14.6)
WBC: 10.2 10*3/uL (ref 4.5–10.5)

## 2013-06-08 NOTE — Addendum Note (Signed)
Addended by: Townsend Roger D on: 06/08/2013 12:05 PM   Modules accepted: Orders

## 2013-06-08 NOTE — Patient Instructions (Signed)
The patient is instructed to continue all medications as prescribed. Schedule followup with check out clerk upon leaving the clinic  

## 2013-06-08 NOTE — Progress Notes (Signed)
Pre visit review using our clinic review tool, if applicable. No additional management support is needed unless otherwise documented below in the visit note. 

## 2013-06-08 NOTE — Progress Notes (Signed)
Subjective:    Patient ID: [name], male    DOB: Jan 16, 1941, 73 y.o.   [name]  HPI Fall with injury to shoulder and surgery post fracture and now notes low back pain. Movement increased pain and sitting improves pain No back films montoring for renal function and HTN  Review of Systems  Constitutional: Positive for fatigue. Negative for fever.  HENT: Positive for sinus pressure and sneezing. Negative for congestion, hearing loss and postnasal drip.   Eyes: Negative for discharge, redness and visual disturbance.  Respiratory: Negative for cough, shortness of breath and wheezing.   Cardiovascular: Negative for leg swelling.  Gastrointestinal: Negative for abdominal pain, constipation and abdominal distention.  Genitourinary: Negative for urgency and frequency.  Musculoskeletal: Positive for back pain and neck pain. Negative for arthralgias and joint swelling.  Skin: Negative for color change and rash.  Neurological: Negative for weakness and light-headedness.  Hematological: Negative for adenopathy.  Psychiatric/Behavioral: Negative for behavioral problems.   Past Medical History  Diagnosis Date  . Bipolar 1 disorder     takes Abilify daily  . Depression     takes Parnate daily  . Sleep apnea     doesn't use CPAP just nasal canula-report in epic  . Headache(784.0)     occasionally  . Swelling     right hand  . Joint pain   . Joint swelling   . Back pain     reason unknown  . History of colon polyps   . Peripheral edema     takes Lasix daily   . Osteoporosis     takes Drisdol weekly and Caltrate daily  . CKD (chronic kidney disease)     possibly related to chronic lithium exposure and resultant tubular interstitial nephritis (Dr. Erling Cruz)    History   Social History  . Marital Status: Divorced    Spouse Name: N/A    Number of Children: N/A  . Years of Education: N/A   Occupational History  . Not on file.   Social History Main Topics  .  Smoking status: Never Smoker   . Smokeless tobacco: Not on file  . Alcohol Use: No  . Drug Use: No  . Sexual Activity: Yes   Other Topics Concern  . Not on file   Social History Narrative  . No narrative on file    Past Surgical History  Procedure Laterality Date  . Colonoscopy w/ polypectomy  09/07/2002  . Tonsillectomy    . Knee surgery Right   . Reverse shoulder arthroplasty Right 05/22/2013    Procedure: REVERSE SHOULDER ARTHROPLASTY;  Surgeon: Nita Sells, MD;  Location: [name];  Service: Orthopedics;  Laterality: Right;  Right reverse total shoulder    Family History  Problem Relation Age of Onset  . Cancer Mother   . Stroke Father     No Known Allergies  Current Outpatient Prescriptions on File Prior to Visit  Medication Sig Dispense Refill  . ARIPiprazole (ABILIFY) 5 MG tablet Take 1 tablet (5 mg total) by mouth daily.  90 tablet  3  . Calcium-Vitamin D (CALTRATE 600 PLUS-VIT D PO) Take 1 tablet by mouth 2 (two) times daily.       . furosemide (LASIX) 20 MG tablet Take 40 mg by mouth daily.       Marland Kitchen tranylcypromine (PARNATE) 10 MG tablet Take 20-30 mg by mouth 2 (two) times daily. 2 tablets in the morning and 3 tablets in the evening      .  Vitamin D, Ergocalciferol, (DRISDOL) 50000 UNITS CAPS capsule take 1 capsule by mouth every week  4 capsule  1   Current Facility-Administered Medications on File Prior to Visit  Medication Dose Route Frequency Provider Last Rate Last Dose  . cyanocobalamin ((VITAMIN B-12)) injection 1,000 mcg  1,000 mcg Intramuscular Q30 days Ricard Dillon, MD   1,000 mcg at 01/20/11 1039    BP 122/76  Pulse 92  Temp(Src) 97.6 F (36.4 C) (Oral)  Ht 5' 6.5" (1.689 m)  Wt 188 lb (85.276 kg)  BMI 29.89 kg/m2       Objective:   Physical Exam  Constitutional: He appears well-developed and well-nourished.  HENT:  Head: Normocephalic and atraumatic.  Eyes: Conjunctivae are normal. Pupils are equal, round, and reactive to  light.  Neck: Normal range of motion. Neck supple.  Cardiovascular: Normal rate and regular rhythm.   Pulmonary/Chest: Effort normal and breath sounds normal.  Abdominal: Soft. Bowel sounds are normal.  Musculoskeletal: He exhibits tenderness.  Right shoulder in sling          Assessment & Plan:  Has not been to PT for shoulder fracture but has to be in sling for 6 weeks Has back pain and I do not see back flims  Will need to rule our a compression fracture  Measure bmet Hx of CRF  Stable creat! Stable HTN Blood transfusion post op so needs to have CBC

## 2013-06-11 ENCOUNTER — Telehealth: Payer: Self-pay | Admitting: Internal Medicine

## 2013-06-11 NOTE — Telephone Encounter (Signed)
Relevant patient education mailed to patient.  

## 2013-06-15 ENCOUNTER — Ambulatory Visit (INDEPENDENT_AMBULATORY_CARE_PROVIDER_SITE_OTHER)
Admission: RE | Admit: 2013-06-15 | Discharge: 2013-06-15 | Disposition: A | Discharge status: Home / Self Care | Payer: Medicare Other | Source: Ambulatory Visit | Attending: Internal Medicine | Admitting: Internal Medicine

## 2013-06-15 DIAGNOSIS — M549 Dorsalgia, unspecified: Secondary | ICD-10-CM

## 2013-06-19 ENCOUNTER — Telehealth: Payer: Self-pay | Admitting: Internal Medicine

## 2013-06-19 NOTE — Telephone Encounter (Signed)
Pt would like to replace his C-Pap with a breathing machine. He would also like a referral to get retainer for his C-Pap. Pt has [employer]

## 2013-06-20 NOTE — Telephone Encounter (Signed)
Per Dr Arnoldo Morale refer to pulmonary

## 2013-06-20 NOTE — Telephone Encounter (Signed)
Patient does not need a pulmonologist.  He wanted to know if you know a dentist where he can get a retainer to prevent sleep apnea.  His brother in [place] has one and it works well.

## 2013-06-21 NOTE — Telephone Encounter (Signed)
Patient is aware that he should call around for an orthodontist

## 2013-07-16 ENCOUNTER — Other Ambulatory Visit: Payer: Self-pay | Admitting: Internal Medicine

## 2013-08-29 ENCOUNTER — Encounter: Payer: Self-pay | Admitting: Internal Medicine

## 2013-08-29 ENCOUNTER — Ambulatory Visit (INDEPENDENT_AMBULATORY_CARE_PROVIDER_SITE_OTHER): Payer: Medicare Other | Admitting: Internal Medicine

## 2013-08-29 VITALS — BP 116/70 | Temp 98.4°F | Ht 66.5 in | Wt 190.0 lb

## 2013-08-29 DIAGNOSIS — M25519 Pain in unspecified shoulder: Secondary | ICD-10-CM

## 2013-08-29 DIAGNOSIS — N182 Chronic kidney disease, stage 2 (mild): Secondary | ICD-10-CM

## 2013-08-29 DIAGNOSIS — I872 Venous insufficiency (chronic) (peripheral): Secondary | ICD-10-CM | POA: Insufficient documentation

## 2013-08-29 DIAGNOSIS — M25511 Pain in right shoulder: Secondary | ICD-10-CM | POA: Insufficient documentation

## 2013-08-29 LAB — CBC WITH DIFFERENTIAL/PLATELET
BASOS ABS: 0.1 10*3/uL (ref 0.0–0.1)
Basophils Relative: 0.7 % (ref 0.0–3.0)
Eosinophils Absolute: 0.3 10*3/uL (ref 0.0–0.7)
Eosinophils Relative: 3.9 % (ref 0.0–5.0)
HEMATOCRIT: 39.4 % (ref 39.0–52.0)
Hemoglobin: 13.3 g/dL (ref 13.0–17.0)
LYMPHS ABS: 1.2 10*3/uL (ref 0.7–4.0)
Lymphocytes Relative: 16.8 % (ref 12.0–46.0)
MCHC: 33.7 g/dL (ref 30.0–36.0)
MCV: 90 fl (ref 78.0–100.0)
MONO ABS: 0.5 10*3/uL (ref 0.1–1.0)
Monocytes Relative: 7.5 % (ref 3.0–12.0)
Neutro Abs: 5 10*3/uL (ref 1.4–7.7)
Neutrophils Relative %: 71.1 % (ref 43.0–77.0)
Platelets: 190 10*3/uL (ref 150.0–400.0)
RBC: 4.38 Mil/uL (ref 4.22–5.81)
RDW: 13.6 % (ref 11.5–15.5)
WBC: 7 10*3/uL (ref 4.0–10.5)

## 2013-08-29 LAB — BASIC METABOLIC PANEL
BUN: 37 mg/dL — AB (ref 6–23)
CHLORIDE: 104 meq/L (ref 96–112)
CO2: 27 meq/L (ref 19–32)
Calcium: 9.2 mg/dL (ref 8.4–10.5)
Creatinine, Ser: 2.2 mg/dL — ABNORMAL HIGH (ref 0.4–1.5)
GFR: 31.79 mL/min — ABNORMAL LOW (ref >60.00)
GLUCOSE: 112 mg/dL — AB (ref 70–99)
POTASSIUM: 4.1 meq/L (ref 3.5–5.1)
Sodium: 139 mEq/L (ref 135–145)

## 2013-08-29 MED ORDER — HYDROCODONE-ACETAMINOPHEN 5-325 MG PO TABS: ORAL_TABLET | ORAL | Status: DC

## 2013-08-29 NOTE — Progress Notes (Signed)
Pre visit review using our clinic review tool, if applicable. No additional management support is needed unless otherwise documented below in the visit note. 

## 2013-08-29 NOTE — Progress Notes (Signed)
Subjective:    Patient ID: [name], male    DOB: Jun 11, 1940, 73 y.o.   MRN: [id number]  HPI  73 year old white male with history of bipolar disorder, sleep apnea and chronic kidney disease presents for concerns about renal insufficiency.  Patient has had renal insufficiency with serum creatinine near 2 for several years. His renal insufficiency presumed secondary to lithium toxicity. He was evaluated by nephrologist 3-4 months ago-Dr. Florene Glen.  Interval medical history-patient underwent right shoulder surgery. He has been using over-the-counter ibuprofen as needed for pain within the last several weeks.   Review of Systems Positive for mild chronic lower extremity swelling, mild right hand swelling    Past Medical History  Diagnosis Date  . Bipolar 1 disorder     takes Abilify daily  . Depression     takes Parnate daily  . Sleep apnea     doesn't use CPAP just nasal canula-report in epic  . Headache(784.0)     occasionally  . Swelling     right hand  . Joint pain   . Joint swelling   . Back pain     reason unknown  . History of colon polyps   . Peripheral edema     takes Lasix daily   . Osteoporosis     takes Drisdol weekly and Caltrate daily  . CKD (chronic kidney disease)     possibly related to chronic lithium exposure and resultant tubular interstitial nephritis (Dr. Erling Cruz)    History   Social History  . Marital Status: Divorced    Spouse Name: N/A    Number of Children: N/A  . Years of Education: N/A   Occupational History  . Not on file.   Social History Main Topics  . Smoking status: Never Smoker   . Smokeless tobacco: Not on file  . Alcohol Use: No  . Drug Use: No  . Sexual Activity: Yes   Other Topics Concern  . Not on file   Social History Narrative  . No narrative on file    Past Surgical History  Procedure Laterality Date  . Colonoscopy w/ polypectomy  09/07/2002  . Tonsillectomy    . Knee surgery Right   . Reverse  shoulder arthroplasty Right 05/22/2013    Procedure: REVERSE SHOULDER ARTHROPLASTY;  Surgeon: Nita Sells, MD;  Location: [facility];  Service: Orthopedics;  Laterality: Right;  Right reverse total shoulder    Family History  Problem Relation Age of Onset  . Cancer Mother   . Stroke Father     No Known Allergies  Current Outpatient Prescriptions on File Prior to Visit  Medication Sig Dispense Refill  . ARIPiprazole (ABILIFY) 5 MG tablet Take 1 tablet (5 mg total) by mouth daily.  90 tablet  3  . Calcium-Vitamin D (CALTRATE 600 PLUS-VIT D PO) Take 1 tablet by mouth 2 (two) times daily.       . furosemide (LASIX) 20 MG tablet Take 40 mg by mouth daily.       Marland Kitchen tranylcypromine (PARNATE) 10 MG tablet Take 20-30 mg by mouth 2 (two) times daily. 2 tablets in the morning and 3 tablets in the evening      . Vitamin D, Ergocalciferol, (DRISDOL) 50000 UNITS CAPS capsule take 1 capsule by mouth every week  4 capsule  1   Current Facility-Administered Medications on File Prior to Visit  Medication Dose Route Frequency Provider Last Rate Last Dose  . cyanocobalamin ((VITAMIN B-12)) injection 1,000 mcg  1,000 mcg Intramuscular Q30 days Ricard Dillon, MD   1,000 mcg at 01/20/11 1039    BP 116/70  Temp(Src) 98.4 F (36.9 C) (Oral)  Ht 5' 6.5" (1.689 m)  Wt 190 lb (86.183 kg)  BMI 30.21 kg/m2    Objective:   Physical Exam  Constitutional: He is oriented to person, place, and time. He appears well-developed and well-nourished.  Cardiovascular: Normal rate, regular rhythm and normal heart sounds.   No murmur heard. Pulmonary/Chest: Effort normal and breath sounds normal. He has no wheezes.  Abdominal: Soft. Bowel sounds are normal. There is no tenderness.  Musculoskeletal:  +1 edema of lower extremities  Neurological: He is alert and oriented to person, place, and time. No cranial nerve deficit.  Skin:  Chronic venous stasis changes near ankles bilaterally  Psychiatric: He has a  normal mood and affect. His behavior is normal.          Assessment & Plan:

## 2013-08-29 NOTE — Patient Instructions (Signed)
Avoid over the counter nonsteroidal anti-inflammatory medication. Avoid dehydration

## 2013-08-29 NOTE — Assessment & Plan Note (Signed)
Patient has chronic venous insufficiency with bilateral stasis dermatitis. Hold Lasix for now. Patient advised to use his compression stockings on a regular basis.

## 2013-08-29 NOTE — Assessment & Plan Note (Signed)
Patient underwent a right shoulder arthroplasty in March of 2015. He has intermittent chronic pain. Patient understands importance of avoiding NSAIDs considering history of renal insufficiency. Use Vicodin 5/325 one half to one tablet every 12 hours as needed.

## 2013-08-29 NOTE — Assessment & Plan Note (Signed)
73 year-old with history of chronic kidney disease. According to his previous workup by nephrology, his renal insufficiency presumed secondary to lithium toxicity. I stressed importance of avoiding further injury to his kidneys. Patient advised to stop using NSAIDs.  Monitor electrolytes and kidney function. Check intact PTH and CBC. Obtain renal ultrasound.

## 2013-08-30 LAB — PTH, INTACT AND CALCIUM
Calcium: 9.1 mg/dL (ref 8.4–10.5)
PTH: 61 pg/mL (ref 14.0–72.0)

## 2013-08-31 ENCOUNTER — Ambulatory Visit
Admission: RE | Admit: 2013-08-31 | Discharge: 2013-08-31 | Disposition: A | Discharge status: Home / Self Care | Payer: Medicare Other | Source: Ambulatory Visit | Attending: Internal Medicine | Admitting: Internal Medicine

## 2013-08-31 DIAGNOSIS — N182 Chronic kidney disease, stage 2 (mild): Secondary | ICD-10-CM

## 2013-09-03 LAB — VITAMIN D 1,25 DIHYDROXY
VITAMIN D 1, 25 (OH) TOTAL: 36 pg/mL (ref 18–72)
VITAMIN D2 1, 25 (OH): 16 pg/mL
Vitamin D3 1, 25 (OH)2: 20 pg/mL

## 2013-09-07 ENCOUNTER — Encounter: Payer: Self-pay | Admitting: Internal Medicine

## 2013-09-23 ENCOUNTER — Other Ambulatory Visit: Payer: Self-pay | Admitting: Internal Medicine

## 2013-11-21 ENCOUNTER — Other Ambulatory Visit (INDEPENDENT_AMBULATORY_CARE_PROVIDER_SITE_OTHER): Payer: Medicare Other

## 2013-11-21 ENCOUNTER — Other Ambulatory Visit: Payer: Self-pay | Admitting: Internal Medicine

## 2013-11-21 DIAGNOSIS — I10 Essential (primary) hypertension: Secondary | ICD-10-CM

## 2013-11-21 LAB — BASIC METABOLIC PANEL
BUN: 36 mg/dL — ABNORMAL HIGH (ref 6–23)
CO2: 32 mEq/L (ref 19–32)
Calcium: 9.9 mg/dL (ref 8.4–10.5)
Chloride: 100 mEq/L (ref 96–112)
Creatinine, Ser: 2.3 mg/dL — ABNORMAL HIGH (ref 0.4–1.5)
GFR: 29.71 mL/min — ABNORMAL LOW (ref >60.00)
GLUCOSE: 119 mg/dL — AB (ref 70–99)
POTASSIUM: 4.5 meq/L (ref 3.5–5.1)
Sodium: 142 mEq/L (ref 135–145)

## 2013-11-27 ENCOUNTER — Ambulatory Visit (INDEPENDENT_AMBULATORY_CARE_PROVIDER_SITE_OTHER): Payer: Medicare Other | Admitting: Internal Medicine

## 2013-11-27 ENCOUNTER — Encounter: Payer: Self-pay | Admitting: Internal Medicine

## 2013-11-27 VITALS — BP 102/70 | HR 76 | Temp 98.7°F | Ht 66.5 in | Wt 180.0 lb

## 2013-11-27 DIAGNOSIS — F3131 Bipolar disorder, current episode depressed, mild: Secondary | ICD-10-CM

## 2013-11-27 DIAGNOSIS — N183 Chronic kidney disease, stage 3 unspecified: Secondary | ICD-10-CM

## 2013-11-27 DIAGNOSIS — Z23 Encounter for immunization: Secondary | ICD-10-CM

## 2013-11-27 MED ORDER — FUROSEMIDE 20 MG PO TABS: 20.0000 mg | ORAL_TABLET | Freq: Every day | ORAL | Status: DC

## 2013-11-27 MED ORDER — VITAMIN D 50 MCG (2000 UT) PO CAPS: 2000.0000 [IU] | ORAL_CAPSULE | Freq: Every day | ORAL | Status: DC

## 2013-11-27 NOTE — Assessment & Plan Note (Signed)
Followed and managed by psychiatrist.  I stressed importance of updating his psychiatrist regarding his renal status.  This is especially important in considering medication changes and dosing adjustments.

## 2013-11-27 NOTE — Progress Notes (Signed)
Subjective:    Patient ID: [name], male    DOB: [birth year], 73 y.o.   [name]  HPI  73 year old white male with history of bipolar disorder and chronic kidney disease for routine followup. Since previous visit patient completed renal ultrasound.  Renal ultrasound was unremarkable.  Right kidney measured 10.1 cm with normal echogenicity. Left kidney 10.4 cm. No hydronephrosis bilaterally.  Patient completed blood work in June. He does not have any associated anemia. His parathyroid hormone was within normal limits.  Bipolar depression-patient continues to work with a psychiatrist.  He reports worsening depressive symptoms.  He has follow up appointment this week.  Review of Systems Chronic lower ext edema unchanged.     Past Medical History  Diagnosis Date  . Bipolar 1 disorder     takes Abilify daily  . Depression     takes Parnate daily  . Sleep apnea     doesn't use CPAP just nasal canula-report in epic  . Headache(784.0)     occasionally  . Swelling     right hand  . Joint pain   . Joint swelling   . Back pain     reason unknown  . History of colon polyps   . Peripheral edema     takes Lasix daily   . Osteoporosis     takes Drisdol weekly and Caltrate daily  . CKD (chronic kidney disease)     possibly related to chronic lithium exposure and resultant tubular interstitial nephritis (Dr. Erling Cruz)    History   Social History  . Marital Status: Divorced    Spouse Name: N/A    Number of Children: N/A  . Years of Education: N/A   Occupational History  . Not on file.   Social History Main Topics  . Smoking status: Never Smoker   . Smokeless tobacco: Not on file  . Alcohol Use: No  . Drug Use: No  . Sexual Activity: Yes   Other Topics Concern  . Not on file   Social History Narrative  . No narrative on file    Past Surgical History  Procedure Laterality Date  . Colonoscopy w/ polypectomy  09/07/2002  . Tonsillectomy    . Knee  surgery Right   . Reverse shoulder arthroplasty Right 05/22/2013    Procedure: REVERSE SHOULDER ARTHROPLASTY;  Surgeon: Nita Sells, MD;  Location: [place];  Service: Orthopedics;  Laterality: Right;  Right reverse total shoulder    Family History  Problem Relation Age of Onset  . Cancer Mother   . Stroke Father     No Known Allergies  Current Outpatient Prescriptions on File Prior to Visit  Medication Sig Dispense Refill  . ARIPiprazole (ABILIFY) 5 MG tablet Take 1 tablet (5 mg total) by mouth daily.  90 tablet  3  . Calcium-Vitamin D (CALTRATE 600 PLUS-VIT D PO) Take 1 tablet by mouth 2 (two) times daily.       Marland Kitchen HYDROcodone-acetaminophen (NORCO/VICODIN) 5-325 MG per tablet 1/2 to 1 tablet twice daily as needed for moderate to severe pain  30 tablet  0  . sertraline (ZOLOFT) 100 MG tablet Take 1 tablet by mouth daily.      Marland Kitchen tranylcypromine (PARNATE) 10 MG tablet Take 20-30 mg by mouth 2 (two) times daily. 2 tablets in the morning and 3 tablets in the evening       Current Facility-Administered Medications on File Prior to Visit  Medication Dose Route Frequency Provider Last Rate Last  Dose  . cyanocobalamin ((VITAMIN B-12)) injection 1,000 mcg  1,000 mcg Intramuscular Q30 days Ricard Dillon, MD   1,000 mcg at 01/20/11 1039    BP 102/70  Pulse 76  Temp(Src) 98.7 F (37.1 C) (Oral)  Ht 5' 6.5" (1.689 m)  Wt 180 lb (81.647 kg)  BMI 28.62 kg/m2    Objective:   Physical Exam  Constitutional: He is oriented to person, place, and time. He appears well-developed and well-nourished. No distress.  Cardiovascular: Normal rate, regular rhythm and normal heart sounds.   Pulmonary/Chest: Effort normal and breath sounds normal. He has no wheezes.  Musculoskeletal:  Bilateral nonpitting edema  Neurological: He is alert and oriented to person, place, and time. No cranial nerve deficit.  Psychiatric: He has a normal mood and affect. His behavior is normal.            Assessment & Plan:

## 2013-11-27 NOTE — Progress Notes (Signed)
Pre visit review using our clinic review tool, if applicable. No additional management support is needed unless otherwise documented below in the visit note. 

## 2013-11-27 NOTE — Assessment & Plan Note (Addendum)
Patient's renal ultrasound was unremarkable. At this point he does not have any associated anemia and his parathyroid hormone levels are normal. I stressed importance of avoiding dehydration, additional renal insults such as NSAIDs, and other acute kidney injury.  Patient encouraged to decrease protein intake.  His GFR over last 6 months 29-30  Continue to monitor.  Repeat labs in 3 months.   Lab Results  Component Value Date   CREATININE 2.3* 11/21/2013   Lab Results  Component Value Date   NA 142 11/21/2013   K 4.5 11/21/2013   CL 100 11/21/2013   CO2 32 11/21/2013   Lab Results  Component Value Date   PTH 61.0 08/29/2013   CALCIUM 9.9 11/21/2013

## 2013-11-27 NOTE — Patient Instructions (Addendum)
Please complete the following lab tests before your next follow up appointment: BMET, CBCD, Intact PTH, phosphorus - 585.2 Please follow low protein diet

## 2013-11-28 ENCOUNTER — Ambulatory Visit: Payer: Medicare Other | Admitting: Internal Medicine

## 2013-12-03 ENCOUNTER — Ambulatory Visit: Payer: Medicare Other | Admitting: Internal Medicine

## 2014-01-07 ENCOUNTER — Telehealth: Payer: Self-pay | Admitting: Internal Medicine

## 2014-01-07 ENCOUNTER — Encounter: Payer: Self-pay | Admitting: Family Medicine

## 2014-01-07 ENCOUNTER — Ambulatory Visit (INDEPENDENT_AMBULATORY_CARE_PROVIDER_SITE_OTHER): Payer: Medicare Other | Admitting: Family Medicine

## 2014-01-07 VITALS — BP 135/77 | HR 110 | Temp 98.7°F | Ht 66.5 in | Wt 180.0 lb

## 2014-01-07 DIAGNOSIS — K589 Irritable bowel syndrome without diarrhea: Secondary | ICD-10-CM

## 2014-01-07 DIAGNOSIS — F3131 Bipolar disorder, current episode depressed, mild: Secondary | ICD-10-CM

## 2014-01-07 MED ORDER — PROMETHAZINE HCL 25 MG PO TABS: 25.0000 mg | ORAL_TABLET | ORAL | Status: DC | PRN

## 2014-01-07 NOTE — Telephone Encounter (Signed)
Pt is at pharm. Dr fry was to try "Phenergan for nausea "  can you call in? Pt is at [facility] now at [place].

## 2014-01-07 NOTE — Telephone Encounter (Signed)
I did send in a rx for this

## 2014-01-07 NOTE — Progress Notes (Signed)
   Subjective:    Patient ID: [name], male    DOB: 1940-06-16, 73 y.o.   [name]  HPI Here for complaints of nausea without vomiting, a dry mouth, and constipation for the past 3- 4 weeks. No fever, no heartburn, no pain. He sees Dr. Casimiro Needle for depression and he has tried some new meds in the past month or so. About 4 weeks ago he was put on Wellbutrin which he took for 3 weeks and then stopped. Now he has been on Remeron for about one week. He sees Dr. Florene Glen frequently for kidney disease, and he had labs done last month. His creatinine was stable at 2.3.   Review of Systems  Constitutional: Negative.   Respiratory: Negative.   Cardiovascular: Negative.   Gastrointestinal: Positive for nausea and constipation. Negative for vomiting, abdominal pain, diarrhea, blood in stool, abdominal distention, anal bleeding and rectal pain.       Objective:   Physical Exam  Constitutional: He appears well-developed and well-nourished. No distress.  Cardiovascular: Normal rate, regular rhythm, normal heart sounds and intact distal pulses.   Pulmonary/Chest: Effort normal and breath sounds normal.  Abdominal: Soft. Bowel sounds are normal. He exhibits no distension and no mass. There is no tenderness. There is no rebound and no guarding.          Assessment & Plan:  He is probably having side effects from his psychiatric meds. Try Phenergan for nausea and Miralax daily for the constipation. He is to see Dr. Casimiro Needle next week and I encouraged him to discuss these issues with him

## 2014-01-07 NOTE — Progress Notes (Signed)
Pre visit review using our clinic review tool, if applicable. No additional management support is needed unless otherwise documented below in the visit note. 

## 2014-01-07 NOTE — Addendum Note (Signed)
Addended by: Alysia Penna A on: 01/07/2014 04:34 PM   Modules accepted: Orders

## 2014-02-12 ENCOUNTER — Telehealth (HOSPITAL_COMMUNITY): Payer: Self-pay | Admitting: *Deleted

## 2014-02-12 NOTE — Telephone Encounter (Signed)
Pt called interested in Transcranial Magnetic Stimulation for Major Depressive Disorder. Conducted screening with pt. Pt screens positive for [place]. Will bring pt in for consultation with psychiatrist. Consultation scheduled for 02/19/14 at 1:00pm.

## 2014-02-18 ENCOUNTER — Telehealth (HOSPITAL_COMMUNITY): Payer: Self-pay | Admitting: *Deleted

## 2014-02-18 NOTE — Telephone Encounter (Signed)
Received call from Dr. Casimiro Needle, MD who sees the pt at another practice. Dr. Casimiro Needle referred pt for repetitive Transcranial Magnetic Stimulation for Major Depressive Disorder. Dr. Casimiro Needle states he has tried pt on multiple antidepressants in the current episode of the pt's depression -- all either ineffective or produced intolerable side effects. Dr. Casimiro Needle states he has been seeing the pt every 2 weeks for half hour appointments for medication management and talk therapy, but the pt's condition is not improving. Will call pt to schedule [place] consultation.

## 2014-02-18 NOTE — Telephone Encounter (Signed)
Called pt to discuss repetitive Transcranial Magnetic Stimulation for Major Depressive Disorder. Conducted phone screening with pt for appropriateness for [place]. Pt screens positive. Scheduled consultation for 02/19/14 at 1:00pm.

## 2014-02-19 ENCOUNTER — Other Ambulatory Visit (HOSPITAL_COMMUNITY): Payer: Medicare Other | Attending: Psychiatry | Admitting: *Deleted

## 2014-02-19 NOTE — Progress Notes (Signed)
Patient ID: [name], male   DOB: Jul 18, 1940, 73 y.o.   MRN: [id number] Pt reported to [facility] clinic for consultation for Transcranial Magnetic Stimulation for chronic severe Major Depressive Disorder. Pt completed a PHQ-9 with a score of 8 (mild depression). Pt also completed a Beck's Depression Inventory with a score of 12 (mild mood disturbance). Pt stated that he felt these scores were an accurate reflection of how he has been for the past week since his antidepressant medication regimen was adjusted. Pt stated "I've never been really severely depressed. I've had episodes of depression all my life, but I've never been functionally incapacitated by it." Writer explained to pt that [name] is only intended for those with severe Major Depressive Disorder, as evidenced by depression inventory scores which reflect this severity. Writer explained to pt that, at this time, he does not meet criteria for [name] tx. Pt resistant to this at first, stating "I'm just now feeling a little better. I have been in the worst place I've ever been in my life for the past 5 months, and you're telling me I can't have the treatment?" Writer explained to pt that [name] cannot be used prophylactically to prevent severe depression, but rather can only be used as an acute treatment to treat depression that already exists. Writer instructed pt to continue to follow up with his psychiatrist and to contact the clinic if his depression worsens such that he would be a candidate for [name] therapy.

## 2014-04-03 DIAGNOSIS — Z471 Aftercare following joint replacement surgery: Secondary | ICD-10-CM | POA: Diagnosis not present

## 2014-04-03 DIAGNOSIS — Z96611 Presence of right artificial shoulder joint: Secondary | ICD-10-CM | POA: Diagnosis not present

## 2014-04-12 ENCOUNTER — Ambulatory Visit: Payer: Self-pay | Admitting: Internal Medicine

## 2014-04-22 ENCOUNTER — Ambulatory Visit: Payer: Self-pay | Admitting: Internal Medicine

## 2014-04-24 ENCOUNTER — Encounter: Payer: Self-pay | Admitting: Family Medicine

## 2014-04-24 ENCOUNTER — Ambulatory Visit (INDEPENDENT_AMBULATORY_CARE_PROVIDER_SITE_OTHER): Payer: Medicare Other | Admitting: Family Medicine

## 2014-04-24 VITALS — BP 118/70 | Temp 98.0°F | Wt 197.0 lb

## 2014-04-24 DIAGNOSIS — M810 Age-related osteoporosis without current pathological fracture: Secondary | ICD-10-CM | POA: Diagnosis not present

## 2014-04-24 DIAGNOSIS — N183 Chronic kidney disease, stage 3 unspecified: Secondary | ICD-10-CM

## 2014-04-24 LAB — BASIC METABOLIC PANEL
BUN: 36 mg/dL — ABNORMAL HIGH (ref 6–23)
CHLORIDE: 104 meq/L (ref 96–112)
CO2: 27 mEq/L (ref 19–32)
Calcium: 9.1 mg/dL (ref 8.4–10.5)
Creatinine, Ser: 2.02 mg/dL — ABNORMAL HIGH (ref 0.40–1.50)
GFR: 34.47 mL/min — ABNORMAL LOW (ref >60.00)
Glucose, Bld: 90 mg/dL (ref 70–99)
Potassium: 4.5 mEq/L (ref 3.5–5.1)
SODIUM: 135 meq/L (ref 135–145)

## 2014-04-24 NOTE — Assessment & Plan Note (Signed)
Continue calcium/vit D only. Obtain Dexa. Bisphosphonates not an option with GFR <35 so could not use fosamax or reclast. Prolia would be potential option but would consult with Dr. Shawna Orleans before starting.

## 2014-04-24 NOTE — Progress Notes (Signed)
  Garret Reddish, MD Phone: [phone]  Subjective:   [name] is a 74 y.o. year old very pleasant male patient who presents with the following:  Osteoporosis-presumed stable -calcium/vit D only. States diagnosed many years ago but cannot recall last dexa. Tries to walk and does some weight lifting. He is interested in reclast which a family member has used ROS- no bone pain, no recent fractures  CKD Stage III- stable #s right around 30 at border between III and IV. Saw  kidney within a year and advised 1 year follow up. Patient avoiding NSAIDs as advised.  ROS- no changes to urination pattern or appearnace Some daytime somnolence (wearing CPAP)  Past Medical History- osteoporosis, ckd III, hypothyroidism, HTN, bipolar  Medications- reviewed and updated Current Outpatient Prescriptions  Medication Sig Dispense Refill  . ARIPiprazole (ABILIFY) 5 MG tablet Take 1 tablet (5 mg total) by mouth daily. 90 tablet 3  . Calcium-Vitamin D (CALTRATE 600 PLUS-VIT D PO) Take 1 tablet by mouth 2 (two) times daily.     . Cholecalciferol (VITAMIN D) 2000 UNITS CAPS Take 1 capsule (2,000 Units total) by mouth daily. 30 capsule   . tranylcypromine (PARNATE) 10 MG tablet Take 20-30 mg by mouth 2 (two) times daily. 2 tablets in the morning and 3 tablets in the evening     Objective: BP 118/70 mmHg  Temp(Src) 98 F (36.7 C)  Wt 197 lb (89.359 kg) Gen: NAD, resting comfortably in chair CV: RRR no murmurs rubs or gallops Lungs: CTAB no crackles, wheeze, rhonchi Ext: 1+ pitting edema Skin: warm, dry, no rash Neuro: grossly normal, moves all extremities   Assessment/Plan:  Osteoporosis Continue calcium/vit D only. Obtain Dexa. Bisphosphonates not an option with GFR <35 so could not use fosamax or reclast. Prolia would be potential option but would consult with Dr. Shawna Orleans before starting.    Chronic kidney disease (CKD), stage III (moderate) Creatinine/GFR largely stable in 30-35  range. Continue to avoid nsaids. Handout given for renal diet. Other CKD workup completed by Dr. Shawna Orleans. Will follow up with [facility] within the year.     Return precautions advised. Has follow up in March with Dr. Shawna Orleans. From ROS-advised patient to discuss with Dr. Shawna Orleans his daytime somnolence and continue CPAP. Does appear patient stopped lasix from [facility] with stable edema from last visit. Consider restart under Dr. Shawna Orleans at follow up.    Orders Placed This Encounter  Procedures  . DG Bone Density  . Basic metabolic panel   Results for orders placed or performed in visit on 04/24/14 (from the past 24 hour(s))  Basic metabolic panel     Status: Abnormal   Collection Time: 04/24/14  2:45 PM  Result Value Ref Range   Sodium 135 135 - 145 mEq/L   Potassium 4.5 3.5 - 5.1 mEq/L   Chloride 104 96 - 112 mEq/L   CO2 27 19 - 32 mEq/L   Glucose, Bld 90 70 - 99 mg/dL   BUN 36 (H) 6 - 23 mg/dL   Creatinine, Ser 2.02 (H) 0.40 - 1.50 mg/dL   Calcium 9.1 8.4 - 10.5 mg/dL   GFR 34.47 (L) >60.00 mL/min

## 2014-04-24 NOTE — Patient Instructions (Addendum)
Ordered bone density to evaluate osteoporosis. Unfortunately, many options limited by kidney function.   Update kidney function today. Continue to avoid medicines like aleve/ibuprofen/motrin  Food Basics for Chronic Kidney Disease When your kidneys are not working well, they cannot remove waste and excess substances from your blood as effectively as they did before. This can lead to a buildup and imbalance of these substances, which can affect how your body functions. This buildup can also make your kidneys work harder, causing even more damage. You may need to eat less of certain foods that can lead to the buildup of these substances in your body. By making the changes to your diet that are recommended by your dietitian or health care provider, you could possibly help prevent further kidney damage and delay or prevent the need for dialysis. The following information can help give you a basic understanding of these substances and how they affect your bodily functions. The information also gives examples of foods that contain the highest amounts of these substances. WHAT DO I NEED TO KNOW ABOUT SUBSTANCES IN MY FOOD THAT I MAY NEED TO ADJUST? Food adjustments will be different for each person with chronic kidney disease. It is important that you see a dietitian who can help you determine the specific adjustments that you will need to make for each of the following substances:  Phosphorus Phosphorus is a mineral found in your bones. A balance between calcium and phosphorous is needed to build and maintain healthy bones. Too much phosphorus pulls calcium from your bones. This can make your bones weak and more likely to break. Too much phosphorus can also make your skin itch. Examples of foods rich in phosphorus include:  Milk and cheese.  Dried beans.  Peas.  Colas.  Nuts and peanut butter. Animal Protein Animal protein helps you make and keep muscle. It also helps in the repair of your body's  cells and tissues. One of the natural breakdown products of protein is a waste product called urea. When your kidneys are not working properly, they cannot remove wastes such as urea like they did before you developed chronic kidney disease. You will likely need to limit the amount of protein you eat to help prevent a buildup of urea in your blood. Examples of animal protein include:  Meat (all types).  Fish and seafood.  Poultry.  Eggs. Sodium Sodium, which is found in salt, helps maintain a healthy balance of fluids in your body. Too much sodium can increase your blood pressure level and have a negative affect on the function of your heart and lungs. Too much sodium also can cause your body to retain too much fluid, making your kidneys work harder. Examples of foods with high levels of sodium include:  Salt seasonings.  Soy sauce.  Cured and processed meats.  Salted crackers and snack foods.  Fast food.  Canned soups and most canned foods. Glucose Glucose provides energy for your body. If you have diabetes mellitus that is not properly controlled, you have too much glucose in your blood. Too much glucose in your blood can worsen the function of your kidneys by damaging small blood vessels. This prevents enough blood flow to your kidneys to give them what they need to work. If you have diabetes mellitus and chronic kidney disease, it is important to maintain your blood glucose at a level recommended by your health care provider. SHOULD I TAKE A VITAMIN AND MINERAL SUPPLEMENT? Because you may need to avoid eating certain  foods, you may not get all of the vitamins and minerals that would normally come from those foods. Your health care provider or dietitian may recommend that you take a supplement to ensure that you get all of the vitamins and minerals that your body needs.  Document Released: 05/29/2002 Document Revised: 07/23/2013 Document Reviewed: 02/02/2013 [facility] Patient  Information 2015 [place], [place]. This information is not intended to replace advice given to you by your health care provider. Make sure you discuss any questions you have with your health care provider.

## 2014-04-24 NOTE — Assessment & Plan Note (Signed)
Creatinine/GFR largely stable in 30-35 range. Continue to avoid nsaids. Handout given for renal diet. Other CKD workup completed by Dr. Shawna Orleans. Will follow up with [facility] within the year.

## 2014-04-29 DIAGNOSIS — G4733 Obstructive sleep apnea (adult) (pediatric): Secondary | ICD-10-CM | POA: Diagnosis not present

## 2014-05-02 ENCOUNTER — Ambulatory Visit (INDEPENDENT_AMBULATORY_CARE_PROVIDER_SITE_OTHER)
Admission: RE | Admit: 2014-05-02 | Discharge: 2014-05-02 | Disposition: A | Discharge status: Home / Self Care | Payer: Medicare Other | Source: Ambulatory Visit | Attending: Internal Medicine | Admitting: Internal Medicine

## 2014-05-02 DIAGNOSIS — M810 Age-related osteoporosis without current pathological fracture: Secondary | ICD-10-CM | POA: Diagnosis not present

## 2014-05-28 ENCOUNTER — Other Ambulatory Visit (INDEPENDENT_AMBULATORY_CARE_PROVIDER_SITE_OTHER): Payer: Medicare Other

## 2014-05-28 DIAGNOSIS — I10 Essential (primary) hypertension: Secondary | ICD-10-CM | POA: Diagnosis not present

## 2014-05-28 LAB — BASIC METABOLIC PANEL
BUN: 36 mg/dL — AB (ref 6–23)
CHLORIDE: 106 meq/L (ref 96–112)
CO2: 30 mEq/L (ref 19–32)
CREATININE: 2.08 mg/dL — AB (ref 0.40–1.50)
Calcium: 9.5 mg/dL (ref 8.4–10.5)
GFR: 33.32 mL/min — AB (ref >60.00)
Glucose, Bld: 88 mg/dL (ref 70–99)
Potassium: 4.8 mEq/L (ref 3.5–5.1)
Sodium: 139 mEq/L (ref 135–145)

## 2014-05-28 LAB — CBC WITH DIFFERENTIAL/PLATELET
BASOS ABS: 0.1 10*3/uL (ref 0.0–0.1)
Basophils Relative: 0.9 % (ref 0.0–3.0)
EOS ABS: 0.3 10*3/uL (ref 0.0–0.7)
Eosinophils Relative: 4.6 % (ref 0.0–5.0)
HCT: 35.5 % — ABNORMAL LOW (ref 39.0–52.0)
HEMOGLOBIN: 12.1 g/dL — AB (ref 13.0–17.0)
LYMPHS ABS: 1.2 10*3/uL (ref 0.7–4.0)
Lymphocytes Relative: 19 % (ref 12.0–46.0)
MCHC: 34 g/dL (ref 30.0–36.0)
MCV: 90.9 fl (ref 78.0–100.0)
MONO ABS: 0.5 10*3/uL (ref 0.1–1.0)
Monocytes Relative: 8.3 % (ref 3.0–12.0)
Neutro Abs: 4.2 10*3/uL (ref 1.4–7.7)
Neutrophils Relative %: 67.2 % (ref 43.0–77.0)
PLATELETS: 274 10*3/uL (ref 150.0–400.0)
RBC: 3.91 Mil/uL — AB (ref 4.22–5.81)
RDW: 13.2 % (ref 11.5–15.5)
WBC: 6.2 10*3/uL (ref 4.0–10.5)

## 2014-05-28 LAB — PHOSPHORUS: Phosphorus: 3.9 mg/dL (ref 2.3–4.6)

## 2014-05-29 LAB — PTH, INTACT AND CALCIUM
Calcium: 9.5 mg/dL (ref 8.4–10.5)
PTH: 40 pg/mL (ref 14–64)

## 2014-06-04 ENCOUNTER — Ambulatory Visit: Payer: Medicare Other | Admitting: Internal Medicine

## 2014-06-05 ENCOUNTER — Ambulatory Visit (INDEPENDENT_AMBULATORY_CARE_PROVIDER_SITE_OTHER): Payer: Medicare Other | Admitting: Internal Medicine

## 2014-06-05 ENCOUNTER — Encounter: Payer: Self-pay | Admitting: Internal Medicine

## 2014-06-05 VITALS — BP 118/74 | HR 97 | Temp 98.0°F | Ht 66.5 in | Wt 195.0 lb

## 2014-06-05 DIAGNOSIS — R29898 Other symptoms and signs involving the musculoskeletal system: Secondary | ICD-10-CM | POA: Diagnosis not present

## 2014-06-05 DIAGNOSIS — M810 Age-related osteoporosis without current pathological fracture: Secondary | ICD-10-CM | POA: Diagnosis not present

## 2014-06-05 DIAGNOSIS — G4733 Obstructive sleep apnea (adult) (pediatric): Secondary | ICD-10-CM

## 2014-06-05 NOTE — Assessment & Plan Note (Signed)
Patient has a T score of -2.9 of left femoral neck. He has history of right shoulder fracture 1 year ago. Patient not candidate for bisphosphonate therapy based on GFR. Patient to discuss whether Prolia is appropriate treatment with his nephrologist Dr. Florene Glen

## 2014-06-05 NOTE — Patient Instructions (Signed)
Your have osteoporosis Discuss treatment with Prolia with your nephrologist

## 2014-06-05 NOTE — Assessment & Plan Note (Addendum)
Patient has complaints of gait instability and lower extremity weakness. He has decreased vibration sense of left lower leg. We discussed possibility of lumbar radiculopathy/lumbar spinal stenosis. Obtain MRI of lumbar spine.  He may benefit from physical therapy.

## 2014-06-05 NOTE — Assessment & Plan Note (Signed)
Patient having persistent symptoms this fatigue despite using CPAP. Refer to Dr. Gwenette Greet for further evaluation.

## 2014-06-05 NOTE — Progress Notes (Signed)
Subjective:    Patient ID: [name], male    DOB: [birth year], 74 y.o.   MRN: [id number]  HPI  74 year old white male with history of chronic renal insufficiency, bipolar disorder and osteoporosis for follow-up. Patient seen by Dr. Yong Channel several months ago with concerns for treatment of osteoporosis. We reviewed his bone density scan. He has history of right shoulder fracture after suffering a fall one year ago.  Patient also has history of obstructive sleep apnea. Patient reports using CPAP machine. However he complains of chronic fatigue. He frequently falls asleep while reading.  He also complains of lower extremity weakness. This has been ongoing on and off for 1 year.     Review of Systems History of low back pain, he denies hx of polyneuropathy    Past Medical History  Diagnosis Date  . Bipolar 1 disorder     takes Abilify daily  . Depression     takes Parnate daily  . Sleep apnea     doesn't use CPAP just nasal canula-report in epic  . Headache(784.0)     occasionally  . Swelling     right hand  . Joint pain   . Joint swelling   . Back pain     reason unknown  . History of colon polyps   . Peripheral edema     takes Lasix daily   . Osteoporosis     takes Drisdol weekly and Caltrate daily  . CKD (chronic kidney disease)     possibly related to chronic lithium exposure and resultant tubular interstitial nephritis (Dr. Erling Cruz)    History   Social History  . Marital Status: Divorced    Spouse Name: N/A  . Number of Children: N/A  . Years of Education: N/A   Occupational History  . Not on file.   Social History Main Topics  . Smoking status: Never Smoker   . Smokeless tobacco: Never Used  . Alcohol Use: No  . Drug Use: No  . Sexual Activity: Yes   Other Topics Concern  . Not on file   Social History Narrative    Past Surgical History  Procedure Laterality Date  . Colonoscopy w/ polypectomy  09/07/2002  . Tonsillectomy    . Knee  surgery Right   . Reverse shoulder arthroplasty Right 05/22/2013    Procedure: REVERSE SHOULDER ARTHROPLASTY;  Surgeon: Nita Sells, MD;  Location: [place];  Service: Orthopedics;  Laterality: Right;  Right reverse total shoulder    Family History  Problem Relation Age of Onset  . Cancer Mother   . Stroke Father     No Known Allergies  Current Outpatient Prescriptions on File Prior to Visit  Medication Sig Dispense Refill  . ARIPiprazole (ABILIFY) 5 MG tablet Take 1 tablet (5 mg total) by mouth daily. 90 tablet 3  . Calcium-Vitamin D (CALTRATE 600 PLUS-VIT D PO) Take 1 tablet by mouth 2 (two) times daily.     . Cholecalciferol (VITAMIN D) 2000 UNITS CAPS Take 1 capsule (2,000 Units total) by mouth daily. 30 capsule   . tranylcypromine (PARNATE) 10 MG tablet Take 20-30 mg by mouth 2 (two) times daily. 2 tablets in the morning and 3 tablets in the evening     Current Facility-Administered Medications on File Prior to Visit  Medication Dose Route Frequency Provider Last Rate Last Dose  . cyanocobalamin ((VITAMIN B-12)) injection 1,000 mcg  1,000 mcg Intramuscular Q30 days Ricard Dillon, MD   1,000 mcg at  01/20/11 1039    BP 118/74 mmHg  Pulse 97  Temp(Src) 98 F (36.7 C) (Oral)  Ht 5' 6.5" (1.689 m)  Wt 195 lb (88.451 kg)  BMI 31.01 kg/m2     Objective:   Physical Exam  Constitutional: He is oriented to person, place, and time. He appears well-developed and well-nourished.  HENT:  Head: Normocephalic and atraumatic.  Cardiovascular: Normal rate, regular rhythm and normal heart sounds.   Pulmonary/Chest: Effort normal and breath sounds normal. He has no wheezes.  Musculoskeletal:  Bilateral lower extremity edema  Neurological: He is alert and oriented to person, place, and time. No cranial nerve deficit.  Wide-based gait, weakness of hip flexors (slight difficulty getting out of chair) Diminished patellar and Achilles reflex bilaterally Decreased sensation to  vibration in left lower extremity  Skin: Skin is warm and dry.  Lower extremity stasis dermatitis bilaterally  Psychiatric: He has a normal mood and affect. His behavior is normal.          Assessment & Plan:

## 2014-06-10 DIAGNOSIS — N183 Chronic kidney disease, stage 3 (moderate): Secondary | ICD-10-CM | POA: Diagnosis not present

## 2014-06-11 ENCOUNTER — Ambulatory Visit
Admission: RE | Admit: 2014-06-11 | Discharge: 2014-06-11 | Disposition: A | Discharge status: Home / Self Care | Payer: Medicare Other | Source: Ambulatory Visit | Attending: Internal Medicine | Admitting: Internal Medicine

## 2014-06-11 DIAGNOSIS — R29898 Other symptoms and signs involving the musculoskeletal system: Secondary | ICD-10-CM

## 2014-06-11 DIAGNOSIS — M5136 Other intervertebral disc degeneration, lumbar region: Secondary | ICD-10-CM | POA: Diagnosis not present

## 2014-06-11 DIAGNOSIS — M47816 Spondylosis without myelopathy or radiculopathy, lumbar region: Secondary | ICD-10-CM | POA: Diagnosis not present

## 2014-06-11 DIAGNOSIS — M4806 Spinal stenosis, lumbar region: Secondary | ICD-10-CM | POA: Diagnosis not present

## 2014-06-17 ENCOUNTER — Other Ambulatory Visit: Payer: Self-pay | Admitting: Internal Medicine

## 2014-06-17 DIAGNOSIS — M47817 Spondylosis without myelopathy or radiculopathy, lumbosacral region: Secondary | ICD-10-CM

## 2014-07-15 DIAGNOSIS — M4806 Spinal stenosis, lumbar region: Secondary | ICD-10-CM | POA: Diagnosis not present

## 2014-07-17 DIAGNOSIS — M4806 Spinal stenosis, lumbar region: Secondary | ICD-10-CM | POA: Diagnosis not present

## 2014-07-17 DIAGNOSIS — M47816 Spondylosis without myelopathy or radiculopathy, lumbar region: Secondary | ICD-10-CM | POA: Diagnosis not present

## 2014-07-23 ENCOUNTER — Institutional Professional Consult (permissible substitution): Payer: Medicare Other | Admitting: Pulmonary Disease

## 2014-07-23 DIAGNOSIS — M47816 Spondylosis without myelopathy or radiculopathy, lumbar region: Secondary | ICD-10-CM | POA: Diagnosis not present

## 2014-07-29 DIAGNOSIS — R351 Nocturia: Secondary | ICD-10-CM | POA: Diagnosis not present

## 2014-07-29 DIAGNOSIS — N3941 Urge incontinence: Secondary | ICD-10-CM | POA: Diagnosis not present

## 2014-07-29 DIAGNOSIS — R3915 Urgency of urination: Secondary | ICD-10-CM | POA: Diagnosis not present

## 2014-07-29 DIAGNOSIS — N183 Chronic kidney disease, stage 3 (moderate): Secondary | ICD-10-CM | POA: Diagnosis not present

## 2014-08-05 ENCOUNTER — Ambulatory Visit (INDEPENDENT_AMBULATORY_CARE_PROVIDER_SITE_OTHER): Payer: Medicare Other | Admitting: Pulmonary Disease

## 2014-08-05 ENCOUNTER — Encounter: Payer: Self-pay | Admitting: Pulmonary Disease

## 2014-08-05 VITALS — BP 108/82 | HR 90 | Temp 97.9°F | Ht 66.5 in | Wt 194.4 lb

## 2014-08-05 DIAGNOSIS — G4733 Obstructive sleep apnea (adult) (pediatric): Secondary | ICD-10-CM | POA: Diagnosis not present

## 2014-08-05 NOTE — Assessment & Plan Note (Signed)
The patient has a long history of obstructive sleep apnea, and has been wearing C Pap compliantly with a good response. He is currently on 16 cm of water pressure with excellent control of his AHI, and I suspect he can get by with less pressure. Him on the auto setting, and he is agreeable to this approach. I have also encouraged him to work aggressively on weight reduction, and to keep up with his mask changes and supplies.

## 2014-08-05 NOTE — Patient Instructions (Signed)
Will change your pressure to the auto setting.  Let us know if this is not comfortable for you. Keep up with mask cushions and supplies. followup with Dr. Halford Chessman in one year.

## 2014-08-05 NOTE — Progress Notes (Signed)
Subjective:    Patient ID: [name], male    DOB: [birth year], 74 y.o.   MRN: [id number]  HPI The patient is a 74 year old male who I've been asked to see for management of obstructive sleep apnea. He was diagnosed with moderate OSA in 2010, with an AHI of 32 events per hour. He was started on C Pap, and currently is on 16 cm of water pressure with a good AHI on his download. He feels that he has responded very well to CPap, and has been keeping up with his mask cushions and supplies.  He feels that he sleeps well with his device, and is rested in the mornings upon arising. He is satisfied with his daytime alertness.   Sleep Questionnaire What time do you typically go to bed?( Between what hours) 10-11 10-11 at 1552 on 08/05/14 by Glean Hess, CMA How long does it take you to fall asleep? 5 min 5 min at 1552 on 08/05/14 by Glean Hess, CMA How many times during the night do you wake up? 3 3 at 1552 on 08/05/14 by Glean Hess, CMA What time do you get out of bed to start your day? 0730 0730 at 1552 on 08/05/14 by Glean Hess, CMA Do you drive or operate heavy machinery in your occupation? No No at 1552 on 08/05/14 by Glean Hess, CMA How much has your weight changed (up or down) over the past two years? (In pounds) 20 lb (9.072 kg) 20 lb (9.072 kg) at 1552 on 08/05/14 by Glean Hess, CMA Have you ever had a sleep study before? Yes Yes at 1552 on 08/05/14 by Glean Hess, CMA If yes, location of study? If yes, date of study? Do you currently use CPAP? Yes Yes at 1552 on 08/05/14 by Glean Hess, CMA If so, what pressure? 16 16 at 1552 on 08/05/14 by Glean Hess, CMA Do you wear oxygen at any time? No    Review of Systems  Constitutional: Negative for fever and unexpected weight change.  HENT: Negative for congestion, dental problem, ear pain, nosebleeds, postnasal drip, rhinorrhea, sinus pressure, sneezing, sore throat and trouble swallowing.     Eyes: Negative for redness and itching.  Respiratory: Negative for cough, chest tightness, shortness of breath and wheezing.   Cardiovascular: Negative for palpitations and leg swelling.  Gastrointestinal: Negative for nausea and vomiting.  Genitourinary: Negative for dysuria.  Musculoskeletal: Negative for joint swelling.  Skin: Negative for rash.  Neurological: Negative for headaches.  Hematological: Does not bruise/bleed easily.  Psychiatric/Behavioral: Negative for dysphoric mood. The patient is not nervous/anxious.        Objective:   Physical Exam Constitutional:  Overweight male, no acute distress  HENT:  Nares patent without discharge, +enlarged turbinates  Oropharynx without exudate, palate and uvula are elongated  Eyes:  Perrla, eomi, no scleral icterus  Neck:  No JVD, no TMG  Cardiovascular:  Normal rate, regular rhythm, no rubs or gallops.  No murmurs        Intact distal pulses  Pulmonary :  Normal breath sounds, no stridor or respiratory distress   No rales, rhonchi, or wheezing  Abdominal:  Soft, nondistended, bowel sounds present.  No tenderness noted.   Musculoskeletal:  No lower extremity edema noted.  Lymph Nodes:  No cervical lymphadenopathy noted  Skin:  No cyanosis noted  Neurologic:  Alert, appropriate, moves all 4 extremities without obvious deficit.  Assessment & Plan:

## 2014-08-07 ENCOUNTER — Ambulatory Visit: Payer: Medicare Other | Admitting: Internal Medicine

## 2014-08-09 DIAGNOSIS — G4733 Obstructive sleep apnea (adult) (pediatric): Secondary | ICD-10-CM | POA: Diagnosis not present

## 2014-08-12 DIAGNOSIS — M47816 Spondylosis without myelopathy or radiculopathy, lumbar region: Secondary | ICD-10-CM | POA: Diagnosis not present

## 2014-08-12 DIAGNOSIS — G4733 Obstructive sleep apnea (adult) (pediatric): Secondary | ICD-10-CM | POA: Diagnosis not present

## 2014-08-14 ENCOUNTER — Ambulatory Visit: Payer: Medicare Other | Admitting: Internal Medicine

## 2014-08-26 ENCOUNTER — Ambulatory Visit: Payer: Medicare Other | Admitting: Internal Medicine

## 2014-08-26 ENCOUNTER — Ambulatory Visit (INDEPENDENT_AMBULATORY_CARE_PROVIDER_SITE_OTHER): Payer: Medicare Other | Admitting: Family Medicine

## 2014-08-26 ENCOUNTER — Encounter: Payer: Self-pay | Admitting: Family Medicine

## 2014-08-26 VITALS — BP 100/60 | HR 106 | Temp 98.7°F | Wt 197.2 lb

## 2014-08-26 DIAGNOSIS — R635 Abnormal weight gain: Secondary | ICD-10-CM | POA: Diagnosis not present

## 2014-08-26 LAB — TSH: TSH: 1.35 u[IU]/mL (ref 0.35–4.50)

## 2014-08-26 NOTE — Progress Notes (Signed)
   Subjective:    Patient ID: [name], male    DOB: April 22, 1940, 74 y.o.   [name]  HPI Here with questions about inability to lose weight and chronic fatigue. He had some labs done several months ago but he has not had his thyroid checked for years. His BP has been stable. He has chronic pain in the lower back and legs, and he knows his weight contributes to these.    Review of Systems  Constitutional: Positive for fatigue and unexpected weight change.  Musculoskeletal: Positive for back pain and gait problem.       Objective:   Physical Exam  Constitutional: He appears well-developed and well-nourished.  In pain, walks with a limp           Assessment & Plan:  We will refer him to Nutrition for advice about diet and how to lose weight. Check a TSH today

## 2014-08-26 NOTE — Progress Notes (Signed)
Pre visit review using our clinic review tool, if applicable. No additional management support is needed unless otherwise documented below in the visit note. 

## 2014-08-27 DIAGNOSIS — N3941 Urge incontinence: Secondary | ICD-10-CM | POA: Diagnosis not present

## 2014-08-27 DIAGNOSIS — R3915 Urgency of urination: Secondary | ICD-10-CM | POA: Diagnosis not present

## 2014-08-27 DIAGNOSIS — R351 Nocturia: Secondary | ICD-10-CM | POA: Diagnosis not present

## 2014-09-05 DIAGNOSIS — M1711 Unilateral primary osteoarthritis, right knee: Secondary | ICD-10-CM | POA: Diagnosis not present

## 2014-09-05 DIAGNOSIS — M79672 Pain in left foot: Secondary | ICD-10-CM | POA: Diagnosis not present

## 2014-09-09 DIAGNOSIS — M25562 Pain in left knee: Secondary | ICD-10-CM | POA: Diagnosis not present

## 2014-09-25 ENCOUNTER — Encounter: Payer: Self-pay | Admitting: Internal Medicine

## 2014-09-25 ENCOUNTER — Ambulatory Visit (INDEPENDENT_AMBULATORY_CARE_PROVIDER_SITE_OTHER): Payer: Medicare Other | Admitting: Internal Medicine

## 2014-09-25 VITALS — BP 102/76 | HR 97 | Temp 97.8°F | Ht 66.5 in | Wt 197.1 lb

## 2014-09-25 DIAGNOSIS — R600 Localized edema: Secondary | ICD-10-CM | POA: Diagnosis not present

## 2014-09-25 DIAGNOSIS — R635 Abnormal weight gain: Secondary | ICD-10-CM

## 2014-09-25 MED ORDER — FUROSEMIDE 40 MG PO TABS: 40.0000 mg | ORAL_TABLET | ORAL | Status: DC

## 2014-09-25 NOTE — Progress Notes (Signed)
Pre visit review using our clinic review tool, if applicable. No additional management support is needed unless otherwise documented below in the visit note. 

## 2014-09-25 NOTE — Assessment & Plan Note (Addendum)
74 year old white male with history of chronic renal insufficiency stage III complaints of bilateral lower extremity swelling. Symptoms likely secondary to venous insufficiency. No inguinal adenopathy on exam. He has abdominal distention with prominent superficial veins of abdominal wall. Obtain abdominal ultrasound. Restart Lasix 40 mg every other day. Monitor kidney function.  Patient advised to elevate his feet twice a day and follow low salt diet.

## 2014-09-25 NOTE — Progress Notes (Signed)
Subjective:    Patient ID: [name], male    DOB: December 23, 1940, 74 y.o.   MRN: [id number]  HPI  74 year old white male with history of stage III chronic renal insufficiency, bipolar disorder and overactive bladder complains of bilateral leg swelling for the last 6 months. His symptoms worse over the past 1 month.  Patient denies any associated orthopnea or shortness of breath. He was recently seen by Dr. Sarajane Jews for weight gain. He thinks weight may be partly related to fluid retention. Patient reports he usually drinks 3-4 glasses of water a day. He was previously on Lasix 40 mg once daily but discontinued secondary to renal insufficiency.  Average salt consumption.  Patient denies any leg pain. No redness or tenderness.   Review of Systems No new abdominal distention,  Hx of bilateral venous insufficiency Negative for chest pain, SOB or orthopnea    Past Medical History  Diagnosis Date  . Bipolar 1 disorder     takes Abilify daily  . Depression     takes Parnate daily  . Sleep apnea     doesn't use CPAP just nasal canula-report in epic  . Headache(784.0)     occasionally  . Swelling     right hand  . Joint pain   . Joint swelling   . Back pain     reason unknown  . History of colon polyps   . Peripheral edema     takes Lasix daily   . Osteoporosis     takes Drisdol weekly and Caltrate daily  . CKD (chronic kidney disease)     possibly related to chronic lithium exposure and resultant tubular interstitial nephritis (Dr. Erling Cruz)    History   Social History  . Marital Status: Divorced    Spouse Name: N/A  . Number of Children: N/A  . Years of Education: N/A   Occupational History  . Not on file.   Social History Main Topics  . Smoking status: Never Smoker   . Smokeless tobacco: Never Used  . Alcohol Use: No  . Drug Use: No  . Sexual Activity: Yes   Other Topics Concern  . Not on file   Social History Narrative    Past Surgical History    Procedure Laterality Date  . Colonoscopy w/ polypectomy  09/07/2002  . Tonsillectomy    . Knee surgery Right   . Reverse shoulder arthroplasty Right 05/22/2013    Procedure: REVERSE SHOULDER ARTHROPLASTY;  Surgeon: Nita Sells, MD;  Location: [facility];  Service: Orthopedics;  Laterality: Right;  Right reverse total shoulder    Family History  Problem Relation Age of Onset  . Cancer Mother   . Stroke Father     No Known Allergies  Current Outpatient Prescriptions on File Prior to Visit  Medication Sig Dispense Refill  . ARIPiprazole (ABILIFY) 5 MG tablet Take 1 tablet (5 mg total) by mouth daily. 90 tablet 3  . Calcium-Vitamin D (CALTRATE 600 PLUS-VIT D PO) Take 1 tablet by mouth 2 (two) times daily.     . Cholecalciferol (VITAMIN D) 2000 UNITS CAPS Take 1 capsule (2,000 Units total) by mouth daily. 30 capsule   . NON FORMULARY Blood Builder    . tranylcypromine (PARNATE) 10 MG tablet Take 20-30 mg by mouth 2 (two) times daily. 2 tablets in the morning and 3 tablets in the evening    . vitamin B-12 (CYANOCOBALAMIN) 1000 MCG tablet Take 1,000 mcg by mouth daily.  No current facility-administered medications on file prior to visit.    BP 102/76 mmHg  Pulse 97  Temp(Src) 97.8 F (36.6 C) (Oral)  Ht 5' 6.5" (1.689 m)  Wt 197 lb 1.6 oz (89.404 kg)  BMI 31.34 kg/m2    Objective:   Physical Exam  Constitutional: He appears well-developed and well-nourished. No distress.  HENT:  Head: Normocephalic and atraumatic.  No JVD  Eyes: EOM are normal. Pupils are equal, round, and reactive to light.  Neck: Neck supple.  Cardiovascular: Normal rate, regular rhythm and normal heart sounds.  Exam reveals no friction rub.   No murmur heard. Pulmonary/Chest: Effort normal and breath sounds normal. He has no wheezes. He has no rales.  Abdominal: He exhibits no mass. There is no tenderness.  Abdominal distension, question prominent superficial veins of abdomen No inguinal  adenopathy  Musculoskeletal:  +1 pitting edema bilaterally (mid calf), no calf tenderness  Lymphadenopathy:    He has no cervical adenopathy.  Skin: Skin is warm and dry.  Psychiatric: He has a normal mood and affect. His behavior is normal.          Assessment & Plan:

## 2014-09-25 NOTE — Patient Instructions (Addendum)
Reduce your sodium intake to 2.5 grams per day (low salt diet) Elevate your legs above the level of your heart twice daily as directed Please complete the following lab tests before your next follow up appointment: BMET - renal insufficiency BNP - lower extremity edema A1c - weight gain

## 2014-09-25 NOTE — Assessment & Plan Note (Signed)
Patient's TSH normal.  Unclear whether weight gain related to fluid retention versus use of atypical anti-psychotics for bipolar disorder. Check BMET, BNP and A1c before next office visit.

## 2014-09-30 ENCOUNTER — Ambulatory Visit
Admission: RE | Admit: 2014-09-30 | Discharge: 2014-09-30 | Disposition: A | Discharge status: Home / Self Care | Payer: Medicare Other | Source: Ambulatory Visit | Attending: Internal Medicine | Admitting: Internal Medicine

## 2014-09-30 DIAGNOSIS — N189 Chronic kidney disease, unspecified: Secondary | ICD-10-CM | POA: Diagnosis not present

## 2014-09-30 DIAGNOSIS — R600 Localized edema: Secondary | ICD-10-CM

## 2014-09-30 DIAGNOSIS — K8020 Calculus of gallbladder without cholecystitis without obstruction: Secondary | ICD-10-CM | POA: Diagnosis not present

## 2014-10-01 ENCOUNTER — Telehealth: Payer: Self-pay | Admitting: Internal Medicine

## 2014-10-01 NOTE — Telephone Encounter (Signed)
Patient Name: [name]  DOB: Jun 16, 1940    Initial Comment Caller states c/o gallstones, has questions   Nurse Assessment  Nurse: Julien Girt, RN, Almyra Free Date/Time Eilene Ghazi Time): 10/01/2014 11:41:19 AM  Confirm and document reason for call. If symptomatic, describe symptoms. ---Caller states the nurse called on Monday afternoon and told him he has "stones in his stomach and a fatty liver" . States he was seen for a physical, has had kidney problems and painful edema in his legs and an ultrasound was ordered and done Monday morning. He has a FU appt scheduled for the 20th, but has questions now.  Has the patient traveled out of the country within the last 30 days? ---Not Applicable  Does the patient require triage? ---Declined Triage  Please document clinical information provided and list any resource used. ---Rolanda Lundborg that I would send this message to the nurse and let them know he needs/wants a call back. He verbalized understanding and will cb as needed.     Guidelines    Guideline Title Affirmed Question Affirmed Notes       Final Disposition User

## 2014-10-01 NOTE — Telephone Encounter (Signed)
Left a message for the pt to return my call.  

## 2014-10-02 ENCOUNTER — Other Ambulatory Visit (INDEPENDENT_AMBULATORY_CARE_PROVIDER_SITE_OTHER): Payer: Medicare Other

## 2014-10-02 DIAGNOSIS — I10 Essential (primary) hypertension: Secondary | ICD-10-CM | POA: Diagnosis not present

## 2014-10-02 DIAGNOSIS — E119 Type 2 diabetes mellitus without complications: Secondary | ICD-10-CM | POA: Diagnosis not present

## 2014-10-02 LAB — BASIC METABOLIC PANEL
BUN: 32 mg/dL — AB (ref 6–23)
CALCIUM: 9.7 mg/dL (ref 8.4–10.5)
CO2: 31 meq/L (ref 19–32)
Chloride: 103 mEq/L (ref 96–112)
Creatinine, Ser: 2.4 mg/dL — ABNORMAL HIGH (ref 0.40–1.50)
GFR: 28.22 mL/min — ABNORMAL LOW (ref >60.00)
Glucose, Bld: 99 mg/dL (ref 70–99)
Potassium: 5 mEq/L (ref 3.5–5.1)
SODIUM: 139 meq/L (ref 135–145)

## 2014-10-02 LAB — BRAIN NATRIURETIC PEPTIDE: Pro B Natriuretic peptide (BNP): 6 pg/mL (ref 0.0–100.0)

## 2014-10-02 LAB — HEMOGLOBIN A1C: Hgb A1c MFr Bld: 5.7 % (ref 4.6–6.5)

## 2014-10-02 NOTE — Telephone Encounter (Signed)
Patient very pleasant walked into office with questions and concerns. Below are the questions. Advised we would get an answer and get back to him this afternoon if not tomorrow morning with MD Yoo's responses:  What do you mean by stones in the stomach?  Concerns about fatty liver. What does that mean for him and how serious is it?  What are the plans now after these discoveries?

## 2014-10-02 NOTE — Telephone Encounter (Addendum)
Spoke to the pt.  Advised the pt of below.  He will seek medical attention incase of attack.  Will work on losing weight and staying away from [place] and alcoholic beverages.  Pt made appt for lft.  Has a scheduled appt on 10/09/14 Will try to add on lft to lab work done today.  Trying to reach the lab.  If able to then will need to cancel appt on Friday and pt will need to be notified.

## 2014-10-02 NOTE — Telephone Encounter (Signed)
He should have been told that he has gallstones (gallbladder stones).  I suggest "watch and wait" regarding management.  Patient with gallstones can develop cholecystitis (gallbladder attack),  RUQ or epigastric pain after eating fatty meal.  If this occurs, he needs to seek urgent medical care.  Fatty liver is usually low risk.  Please schedule LFTs and follow up visit in 2 weeks.  We can discuss fatty liver further at his next office visit.  In the meantime, avoid alcohol and work on 5-10 lbs weight loss.

## 2014-10-03 ENCOUNTER — Other Ambulatory Visit (INDEPENDENT_AMBULATORY_CARE_PROVIDER_SITE_OTHER): Payer: Medicare Other

## 2014-10-03 DIAGNOSIS — I10 Essential (primary) hypertension: Secondary | ICD-10-CM

## 2014-10-03 DIAGNOSIS — M25562 Pain in left knee: Secondary | ICD-10-CM | POA: Diagnosis not present

## 2014-10-03 LAB — HEPATIC FUNCTION PANEL
ALT: 15 U/L (ref 0–53)
AST: 19 U/L (ref 0–37)
Albumin: 3.8 g/dL (ref 3.5–5.2)
Alkaline Phosphatase: 73 U/L (ref 39–117)
BILIRUBIN DIRECT: 0 mg/dL (ref 0.0–0.3)
BILIRUBIN TOTAL: 0.6 mg/dL (ref 0.2–1.2)
Total Protein: 6.6 g/dL (ref 6.0–8.3)

## 2014-10-04 ENCOUNTER — Other Ambulatory Visit: Payer: Medicare Other

## 2014-10-04 NOTE — Telephone Encounter (Signed)
LFT was able to be added on to other recent lab work.

## 2014-10-07 DIAGNOSIS — M1711 Unilateral primary osteoarthritis, right knee: Secondary | ICD-10-CM | POA: Diagnosis not present

## 2014-10-09 ENCOUNTER — Ambulatory Visit (INDEPENDENT_AMBULATORY_CARE_PROVIDER_SITE_OTHER): Payer: Medicare Other | Admitting: Internal Medicine

## 2014-10-09 ENCOUNTER — Encounter: Payer: Self-pay | Admitting: Internal Medicine

## 2014-10-09 VITALS — BP 120/80 | HR 84 | Temp 97.7°F | Resp 20 | Ht 66.5 in | Wt 194.0 lb

## 2014-10-09 DIAGNOSIS — Z23 Encounter for immunization: Secondary | ICD-10-CM

## 2014-10-09 DIAGNOSIS — K8020 Calculus of gallbladder without cholecystitis without obstruction: Secondary | ICD-10-CM | POA: Diagnosis not present

## 2014-10-09 DIAGNOSIS — K760 Fatty (change of) liver, not elsewhere classified: Secondary | ICD-10-CM | POA: Diagnosis not present

## 2014-10-09 DIAGNOSIS — N183 Chronic kidney disease, stage 3 unspecified: Secondary | ICD-10-CM

## 2014-10-09 NOTE — Progress Notes (Signed)
Pre visit review using our clinic review tool, if applicable. No additional management support is needed unless otherwise documented below in the visit note. 

## 2014-10-09 NOTE — Assessment & Plan Note (Signed)
Incidental gallstones discovered on abdominal ultrasound. He is asymptomatic. Symptoms of gallbladder attack reviewed with patient.  Expectant management.

## 2014-10-09 NOTE — Assessment & Plan Note (Signed)
Stop lasix.  Repeat BMET in 3 months.  Increase fluid intake.

## 2014-10-09 NOTE — Progress Notes (Signed)
Subjective:    Patient ID: [name], male    DOB: [birth year], 74 y.o.   MRN: [id number]  HPI  74 year old white male with history of chronic renal insufficiency stage III, bipolar disorder and lower ext edema for follow-up. Patient's BNP was normal. Abdominal ultrasound only showed incidental gallstones and fatty liver. His liver function tests are normal.  Chronic renal insufficiency-creatinine slightly worse at 2.4. He is followed yearly by nephrologist-Dr. Florene Glen  He has been decreasing his portions resulting in weight loss.  Review of Systems Negative for chest pain or shortness of breath    Past Medical History  Diagnosis Date  . Bipolar 1 disorder     takes Abilify daily  . Depression     takes Parnate daily  . Sleep apnea     doesn't use CPAP just nasal canula-report in epic  . Headache(784.0)     occasionally  . Swelling     right hand  . Joint pain   . Joint swelling   . Back pain     reason unknown  . History of colon polyps   . Peripheral edema     takes Lasix daily   . Osteoporosis     takes Drisdol weekly and Caltrate daily  . CKD (chronic kidney disease)     possibly related to chronic lithium exposure and resultant tubular interstitial nephritis (Dr. Erling Cruz)    History   Social History  . Marital Status: Divorced    Spouse Name: N/A  . Number of Children: N/A  . Years of Education: N/A   Occupational History  . Not on file.   Social History Main Topics  . Smoking status: Never Smoker   . Smokeless tobacco: Never Used  . Alcohol Use: No  . Drug Use: No  . Sexual Activity: Yes   Other Topics Concern  . Not on file   Social History Narrative    Past Surgical History  Procedure Laterality Date  . Colonoscopy w/ polypectomy  09/07/2002  . Tonsillectomy    . Knee surgery Right   . Reverse shoulder arthroplasty Right 05/22/2013    Procedure: REVERSE SHOULDER ARTHROPLASTY;  Surgeon: Nita Sells, MD;  Location: [facility];  Service: Orthopedics;  Laterality: Right;  Right reverse total shoulder    Family History  Problem Relation Age of Onset  . Cancer Mother   . Stroke Father     No Known Allergies  Current Outpatient Prescriptions on File Prior to Visit  Medication Sig Dispense Refill  . ARIPiprazole (ABILIFY) 5 MG tablet Take 1 tablet (5 mg total) by mouth daily. 90 tablet 3  . Calcium-Vitamin D (CALTRATE 600 PLUS-VIT D PO) Take 1 tablet by mouth 2 (two) times daily.     . Cholecalciferol (VITAMIN D) 2000 UNITS CAPS Take 1 capsule (2,000 Units total) by mouth daily. 30 capsule   . furosemide (LASIX) 40 MG tablet Take 1 tablet (40 mg total) by mouth every other day. 30 tablet 1  . MYRBETRIQ 25 MG TB24 tablet   0  . NON FORMULARY Blood Builder    . tranylcypromine (PARNATE) 10 MG tablet Take 20-30 mg by mouth 2 (two) times daily. 2 tablets in the morning and 3 tablets in the evening    . vitamin B-12 (CYANOCOBALAMIN) 1000 MCG tablet Take 1,000 mcg by mouth daily.     No current facility-administered medications on file prior to visit.    BP 120/80 mmHg  Pulse 84  Temp(Src)  97.7 F (36.5 C) (Oral)  Resp 20  Ht 5' 6.5" (1.689 m)  Wt 194 lb (87.998 kg)  BMI 30.85 kg/m2  SpO2 96%    Objective:   Physical Exam  Constitutional: He is oriented to person, place, and time. He appears well-developed and well-nourished. No distress.  Cardiovascular: Normal rate, regular rhythm and normal heart sounds.   No murmur heard. Pulmonary/Chest: Effort normal and breath sounds normal. He has no wheezes.  Neurological: He is alert and oriented to person, place, and time. No cranial nerve deficit.  Psychiatric: He has a normal mood and affect. His behavior is normal.          Assessment & Plan:

## 2014-10-09 NOTE — Assessment & Plan Note (Signed)
Recent abdominal ultrasound incidentally showed fatty liver. His LFTs are unremarkable. Patient does not drink alcohol. Weight loss encouraged. Patient advised he can also take vitamin E over-the-counter.

## 2014-10-09 NOTE — Patient Instructions (Signed)
Contact our office if you experience symptoms consistent with gallbladder attack Reduce your intake of fatty and sugary foods Please complete the following lab tests before your next follow up appointment: BMET - renal insuff code

## 2014-10-29 DIAGNOSIS — M1711 Unilateral primary osteoarthritis, right knee: Secondary | ICD-10-CM | POA: Diagnosis not present

## 2014-11-05 DIAGNOSIS — M1711 Unilateral primary osteoarthritis, right knee: Secondary | ICD-10-CM | POA: Diagnosis not present

## 2014-11-06 ENCOUNTER — Ambulatory Visit: Payer: Self-pay | Admitting: Internal Medicine

## 2014-11-12 DIAGNOSIS — M1711 Unilateral primary osteoarthritis, right knee: Secondary | ICD-10-CM | POA: Diagnosis not present

## 2014-11-13 ENCOUNTER — Encounter: Payer: Self-pay | Admitting: Family Medicine

## 2014-11-13 ENCOUNTER — Ambulatory Visit (INDEPENDENT_AMBULATORY_CARE_PROVIDER_SITE_OTHER): Payer: Medicare Other | Admitting: Family Medicine

## 2014-11-13 ENCOUNTER — Ambulatory Visit: Payer: Self-pay | Admitting: Internal Medicine

## 2014-11-13 VITALS — BP 117/79 | HR 82 | Temp 97.4°F | Ht 66.5 in | Wt 189.0 lb

## 2014-11-13 DIAGNOSIS — M48061 Spinal stenosis, lumbar region without neurogenic claudication: Secondary | ICD-10-CM

## 2014-11-13 DIAGNOSIS — M4806 Spinal stenosis, lumbar region: Secondary | ICD-10-CM | POA: Diagnosis not present

## 2014-11-13 MED ORDER — TRAMADOL HCL 50 MG PO TABS: 100.0000 mg | ORAL_TABLET | Freq: Four times a day (QID) | ORAL | Status: DC | PRN

## 2014-11-13 NOTE — Progress Notes (Signed)
Pre visit review using our clinic review tool, if applicable. No additional management support is needed unless otherwise documented below in the visit note. 

## 2014-11-13 NOTE — Progress Notes (Signed)
   Subjective:    Patient ID: [name], male    DOB: 1940/05/08, 74 y.o.   [name]  HPI Here asking about chronic low back pain. This has bothered him for years but it getting worse. He takes Tylenol but this does not help. He cannot take NSAIDs due to his kidney disease. He had an MRI of the lumbar spine in March of this year showing a lot of degenerative disease but also severe stenosis at the L4-5 and L5-S1 levels.    Review of Systems  Constitutional: Negative.   Musculoskeletal: Positive for back pain, arthralgias and gait problem.       Objective:   Physical Exam  Constitutional:  He has trouble walking due to pain  Cardiovascular: Normal rate, regular rhythm, normal heart sounds and intact distal pulses.   Pulmonary/Chest: Effort normal and breath sounds normal.  Musculoskeletal:  Lower back is not tender but ROM is limited by pain          Assessment & Plan:  Lumbar stenosis. Try Tramadol for pain. Refer to Neurosurgery for further evaluation.

## 2014-11-19 DIAGNOSIS — M1711 Unilateral primary osteoarthritis, right knee: Secondary | ICD-10-CM | POA: Diagnosis not present

## 2014-11-19 DIAGNOSIS — M4696 Unspecified inflammatory spondylopathy, lumbar region: Secondary | ICD-10-CM | POA: Diagnosis not present

## 2014-11-28 ENCOUNTER — Other Ambulatory Visit (INDEPENDENT_AMBULATORY_CARE_PROVIDER_SITE_OTHER): Payer: Medicare Other

## 2014-11-28 ENCOUNTER — Other Ambulatory Visit: Payer: Self-pay | Admitting: Orthopedic Surgery

## 2014-11-28 DIAGNOSIS — I10 Essential (primary) hypertension: Secondary | ICD-10-CM

## 2014-11-28 DIAGNOSIS — M1711 Unilateral primary osteoarthritis, right knee: Secondary | ICD-10-CM | POA: Diagnosis not present

## 2014-11-28 LAB — CBC WITH DIFFERENTIAL/PLATELET
BASOS PCT: 0.6 % (ref 0.0–3.0)
Basophils Absolute: 0 10*3/uL (ref 0.0–0.1)
EOS PCT: 4 % (ref 0.0–5.0)
Eosinophils Absolute: 0.2 10*3/uL (ref 0.0–0.7)
HEMATOCRIT: 41.2 % (ref 39.0–52.0)
HEMOGLOBIN: 13.7 g/dL (ref 13.0–17.0)
LYMPHS PCT: 22.2 % (ref 12.0–46.0)
Lymphs Abs: 1.3 10*3/uL (ref 0.7–4.0)
MCHC: 33.3 g/dL (ref 30.0–36.0)
MCV: 93.8 fl (ref 78.0–100.0)
MONOS PCT: 9.7 % (ref 3.0–12.0)
Monocytes Absolute: 0.6 10*3/uL (ref 0.1–1.0)
NEUTROS ABS: 3.8 10*3/uL (ref 1.4–7.7)
Neutrophils Relative %: 63.5 % (ref 43.0–77.0)
PLATELETS: 185 10*3/uL (ref 150.0–400.0)
RBC: 4.4 Mil/uL (ref 4.22–5.81)
RDW: 13.2 % (ref 11.5–15.5)
WBC: 6 10*3/uL (ref 4.0–10.5)

## 2014-11-28 LAB — C-REACTIVE PROTEIN: CRP: 0.2 mg/dL — ABNORMAL LOW (ref 0.5–20.0)

## 2014-11-28 LAB — SEDIMENTATION RATE: Sed Rate: 20 mm/hr (ref 0–22)

## 2014-12-03 ENCOUNTER — Encounter: Payer: Self-pay | Admitting: Gastroenterology

## 2014-12-03 DIAGNOSIS — M47816 Spondylosis without myelopathy or radiculopathy, lumbar region: Secondary | ICD-10-CM | POA: Diagnosis not present

## 2014-12-10 DIAGNOSIS — M47816 Spondylosis without myelopathy or radiculopathy, lumbar region: Secondary | ICD-10-CM | POA: Diagnosis not present

## 2014-12-23 ENCOUNTER — Encounter: Payer: Self-pay | Admitting: Gastroenterology

## 2014-12-24 DIAGNOSIS — M722 Plantar fascial fibromatosis: Secondary | ICD-10-CM | POA: Diagnosis not present

## 2014-12-25 DIAGNOSIS — M47816 Spondylosis without myelopathy or radiculopathy, lumbar region: Secondary | ICD-10-CM | POA: Diagnosis not present

## 2015-01-02 DIAGNOSIS — M79672 Pain in left foot: Secondary | ICD-10-CM | POA: Diagnosis not present

## 2015-01-03 ENCOUNTER — Other Ambulatory Visit (INDEPENDENT_AMBULATORY_CARE_PROVIDER_SITE_OTHER): Payer: Medicare Other

## 2015-01-03 DIAGNOSIS — N289 Disorder of kidney and ureter, unspecified: Secondary | ICD-10-CM | POA: Diagnosis not present

## 2015-01-03 LAB — BASIC METABOLIC PANEL
BUN: 35 mg/dL — AB (ref 6–23)
CALCIUM: 9.4 mg/dL (ref 8.4–10.5)
CO2: 28 meq/L (ref 19–32)
CREATININE: 1.96 mg/dL — AB (ref 0.40–1.50)
Chloride: 105 mEq/L (ref 96–112)
GFR: 35.62 mL/min — ABNORMAL LOW (ref >60.00)
Glucose, Bld: 102 mg/dL — ABNORMAL HIGH (ref 70–99)
Potassium: 4.9 mEq/L (ref 3.5–5.1)
Sodium: 140 mEq/L (ref 135–145)

## 2015-01-07 DIAGNOSIS — M79672 Pain in left foot: Secondary | ICD-10-CM | POA: Diagnosis not present

## 2015-01-08 DIAGNOSIS — M19072 Primary osteoarthritis, left ankle and foot: Secondary | ICD-10-CM | POA: Diagnosis not present

## 2015-01-08 DIAGNOSIS — R600 Localized edema: Secondary | ICD-10-CM | POA: Diagnosis not present

## 2015-01-09 DIAGNOSIS — M79672 Pain in left foot: Secondary | ICD-10-CM | POA: Diagnosis not present

## 2015-01-10 ENCOUNTER — Encounter: Payer: Self-pay | Admitting: Family Medicine

## 2015-01-10 ENCOUNTER — Ambulatory Visit (INDEPENDENT_AMBULATORY_CARE_PROVIDER_SITE_OTHER): Payer: Medicare Other | Admitting: Family Medicine

## 2015-01-10 ENCOUNTER — Ambulatory Visit: Payer: Medicare Other | Admitting: Internal Medicine

## 2015-01-10 VITALS — BP 110/80 | HR 78 | Temp 97.9°F | Wt 196.0 lb

## 2015-01-10 DIAGNOSIS — Z23 Encounter for immunization: Secondary | ICD-10-CM | POA: Diagnosis not present

## 2015-01-10 DIAGNOSIS — N183 Chronic kidney disease, stage 3 unspecified: Secondary | ICD-10-CM

## 2015-01-10 DIAGNOSIS — E039 Hypothyroidism, unspecified: Secondary | ICD-10-CM

## 2015-01-10 DIAGNOSIS — S92902G Unspecified fracture of left foot, subsequent encounter for fracture with delayed healing: Secondary | ICD-10-CM | POA: Diagnosis not present

## 2015-01-10 DIAGNOSIS — I10 Essential (primary) hypertension: Secondary | ICD-10-CM

## 2015-01-10 NOTE — Progress Notes (Signed)
   Subjective:    Patient ID: [name], male    DOB: 27-Mar-1940, 74 y.o.   MRN: [id number]  HPI Here for a follow up visit on issues like kidney disease, HTN, and hypothyroidism. He has been doing well except for left foot pain. He was recently diagnosed with a fracture and he has been seeing Dr. Stoney Bang for this. He wears a boot and he takes Tramadol for pain. His recent labs look good with stable renal function indices.    Review of Systems  Constitutional: Negative.   Respiratory: Negative.   Cardiovascular: Negative.   Musculoskeletal: Positive for arthralgias and gait problem.  Neurological: Negative.        Objective:   Physical Exam  Constitutional: He is oriented to person, place, and time. He appears well-developed and well-nourished.  In pain, limping   Neck: No thyromegaly present.  Cardiovascular: Normal rate, regular rhythm, normal heart sounds and intact distal pulses.   Pulmonary/Chest: Effort normal and breath sounds normal.  Lymphadenopathy:    He has no cervical adenopathy.  Neurological: He is alert and oriented to person, place, and time.          Assessment & Plan:  His renal disease, thyroid disease, and HTN are all stable. He will follow up with Dr. Berenice Primas for the foot fracture. Recheck in 3 months

## 2015-01-10 NOTE — Progress Notes (Signed)
Pre visit review using our clinic review tool, if applicable. No additional management support is needed unless otherwise documented below in the visit note. 

## 2015-01-16 DIAGNOSIS — N401 Enlarged prostate with lower urinary tract symptoms: Secondary | ICD-10-CM | POA: Diagnosis not present

## 2015-01-16 DIAGNOSIS — M79672 Pain in left foot: Secondary | ICD-10-CM | POA: Diagnosis not present

## 2015-01-16 DIAGNOSIS — R3912 Poor urinary stream: Secondary | ICD-10-CM | POA: Diagnosis not present

## 2015-01-16 DIAGNOSIS — N3941 Urge incontinence: Secondary | ICD-10-CM | POA: Diagnosis not present

## 2015-01-16 DIAGNOSIS — R3914 Feeling of incomplete bladder emptying: Secondary | ICD-10-CM | POA: Diagnosis not present

## 2015-01-30 DIAGNOSIS — M25572 Pain in left ankle and joints of left foot: Secondary | ICD-10-CM | POA: Diagnosis not present

## 2015-01-30 DIAGNOSIS — M79672 Pain in left foot: Secondary | ICD-10-CM | POA: Diagnosis not present

## 2015-02-20 ENCOUNTER — Ambulatory Visit (AMBULATORY_SURGERY_CENTER): Payer: Self-pay

## 2015-02-20 VITALS — Ht 66.0 in | Wt 200.6 lb

## 2015-02-20 DIAGNOSIS — Z860100 Personal history of colon polyps, unspecified: Secondary | ICD-10-CM

## 2015-02-20 MED ORDER — SUPREP BOWEL PREP KIT 17.5-3.13-1.6 GM/177ML PO SOLN: 1.0000 | Freq: Once | ORAL | Status: DC

## 2015-02-24 DIAGNOSIS — M25562 Pain in left knee: Secondary | ICD-10-CM | POA: Diagnosis not present

## 2015-02-25 DIAGNOSIS — R3914 Feeling of incomplete bladder emptying: Secondary | ICD-10-CM | POA: Diagnosis not present

## 2015-02-25 DIAGNOSIS — N3941 Urge incontinence: Secondary | ICD-10-CM | POA: Diagnosis not present

## 2015-02-25 DIAGNOSIS — R3912 Poor urinary stream: Secondary | ICD-10-CM | POA: Diagnosis not present

## 2015-02-25 DIAGNOSIS — R3915 Urgency of urination: Secondary | ICD-10-CM | POA: Diagnosis not present

## 2015-02-25 DIAGNOSIS — N401 Enlarged prostate with lower urinary tract symptoms: Secondary | ICD-10-CM | POA: Diagnosis not present

## 2015-02-27 ENCOUNTER — Encounter: Payer: Medicare Other | Admitting: Gastroenterology

## 2015-03-10 ENCOUNTER — Ambulatory Visit (AMBULATORY_SURGERY_CENTER): Payer: Medicare Other | Admitting: Gastroenterology

## 2015-03-10 ENCOUNTER — Encounter: Payer: Self-pay | Admitting: Gastroenterology

## 2015-03-10 VITALS — BP 144/88 | HR 75 | Temp 95.7°F | Resp 21 | Ht 66.0 in | Wt 200.0 lb

## 2015-03-10 DIAGNOSIS — D123 Benign neoplasm of transverse colon: Secondary | ICD-10-CM

## 2015-03-10 DIAGNOSIS — D124 Benign neoplasm of descending colon: Secondary | ICD-10-CM

## 2015-03-10 DIAGNOSIS — D128 Benign neoplasm of rectum: Secondary | ICD-10-CM | POA: Diagnosis not present

## 2015-03-10 DIAGNOSIS — D120 Benign neoplasm of cecum: Secondary | ICD-10-CM | POA: Diagnosis not present

## 2015-03-10 DIAGNOSIS — D122 Benign neoplasm of ascending colon: Secondary | ICD-10-CM | POA: Diagnosis not present

## 2015-03-10 DIAGNOSIS — Z1211 Encounter for screening for malignant neoplasm of colon: Secondary | ICD-10-CM | POA: Diagnosis not present

## 2015-03-10 DIAGNOSIS — Z86010 Personal history of colonic polyps: Secondary | ICD-10-CM

## 2015-03-10 HISTORY — PX: COLONOSCOPY W/ POLYPECTOMY: SHX1380

## 2015-03-10 MED ORDER — SODIUM CHLORIDE 0.9 % IV SOLN: 500.0000 mL | INTRAVENOUS | Status: DC

## 2015-03-10 NOTE — Progress Notes (Signed)
Patient awakening,vss,report to rn 

## 2015-03-10 NOTE — Op Note (Signed)
[facility]  [address] [place], [id number]   COLONOSCOPY PROCEDURE REPORT  PATIENT: Thayer, Yazdani  MR#: [id number] BIRTHDATE: [birth year] , 64  yrs. old GENDER: male ENDOSCOPIST: Yetta Flock, MD REFERRED BY: Drema Pry MD PROCEDURE DATE:  03/10/2015 PROCEDURE:   Colonoscopy, surveillance , Colonoscopy with snare polypectomy, and Colonoscopy with biopsy First Screening Colonoscopy - Avg.  risk and is 50 yrs.  old or older - No.  Prior Negative Screening - Now for repeat screening. N/A  History of Adenoma - Now for follow-up colonoscopy & has been > or = to 3 yrs.  Yes hx of adenoma.  Has been 3 or more years since last colonoscopy.  Polyps removed today? Yes ASA CLASS:   Class III INDICATIONS:Surveillance due to prior colonic neoplasia and Colorectal Neoplasm Risk Assessment for this procedure is average risk. MEDICATIONS: Propofol 350 mg IV  DESCRIPTION OF PROCEDURE:   After the risks benefits and alternatives of the procedure were thoroughly explained, informed consent was obtained.  The digital rectal exam revealed no abnormalities of the rectum.   The LB [id number]  endoscope was introduced through the anus and advanced to the cecum, which was identified by both the appendix and ileocecal valve. No adverse events experienced.   The quality of the prep was adequate  The instrument was then slowly withdrawn as the colon was fully examined. Estimated blood loss is zero unless otherwise noted in this procedure report.    COLON FINDINGS: The bowel preparation was only fair upon initial exam, however following extensive lavage adequate views were able to be obtained.  Two x 61mm sessile cecal polyps were noted and removed with cold forceps.  An 35mm sessile polyp was noted in the ascending colon and removed with cold snare.  A 78mm sessile transverse colon polyp was removed with cold forceps.  A roughly 1cm sessile descending colon polyp was  removed with hot snare.  3 diminutive hyperplastic appearing rectal polyps were removed with cold forceps.  Pancolonic diverticulosis was noted, severe in the left colon, mild in the right colon.  A benign appearing lipoma was noted in the splenic flexure. Retroflexed views revealed internal hemorrhoids. The time to cecum = 3.8 Withdrawal time = 25.0   The scope was withdrawn and the procedure completed. COMPLICATIONS: There were no immediate complications.    ENDOSCOPIC IMPRESSION: 8 colon polyps removed as outlined above Pancolonic diverticulosis Internal hemorrhoids  RECOMMENDATIONS: Await pathology results No aspirin or NSAIDS for 2 weeks post polypectomy Resume diet Resume medications  eSigned:  Yetta Flock, MD 03/10/2015 12:47 PM   cc: Drema Pry MD, the patient   PATIENT NAME:  [name], [name] MR#: [id number]

## 2015-03-10 NOTE — Patient Instructions (Signed)
Colon polyps removed today. Handouts given on polyps,diverticulosis, hemorrhoids.  No aspirin or NSAIDs for 2 weeks. Resume other current medications.    YOU HAD AN ENDOSCOPIC PROCEDURE TODAY AT [facility]:   Refer to the procedure report that was given to you for any specific questions about what was found during the examination.  If the procedure report does not answer your questions, please call your gastroenterologist to clarify.  If you requested that your care partner not be given the details of your procedure findings, then the procedure report has been included in a sealed envelope for you to review at your convenience later.  YOU SHOULD EXPECT: Some feelings of bloating in the abdomen. Passage of more gas than usual.  Walking can help get rid of the air that was put into your GI tract during the procedure and reduce the bloating. If you had a lower endoscopy (such as a colonoscopy or flexible sigmoidoscopy) you may notice spotting of blood in your stool or on the toilet paper. If you underwent a bowel prep for your procedure, you may not have a normal bowel movement for a few days.  Please Note:  You might notice some irritation and congestion in your nose or some drainage.  This is from the oxygen used during your procedure.  There is no need for concern and it should clear up in a day or so.  SYMPTOMS TO REPORT IMMEDIATELY:   Following lower endoscopy (colonoscopy or flexible sigmoidoscopy):  Excessive amounts of blood in the stool  Significant tenderness or worsening of abdominal pains  Swelling of the abdomen that is new, acute  Fever of 100F or higher   For urgent or emergent issues, a gastroenterologist can be reached at any hour by calling [phone].   DIET: Your first meal following the procedure should be a small meal and then it is ok to progress to your normal diet. Heavy or fried foods are harder to digest and may make you feel nauseous or bloated.   Likewise, meals heavy in dairy and vegetables can increase bloating.  Drink plenty of fluids but you should avoid alcoholic beverages for 24 hours.  ACTIVITY:  You should plan to take it easy for the rest of today and you should NOT DRIVE or use heavy machinery until tomorrow (because of the sedation medicines used during the test).    FOLLOW UP: Our staff will call the number listed on your records the next business day following your procedure to check on you and address any questions or concerns that you may have regarding the information given to you following your procedure. If we do not reach you, we will leave a message.  However, if you are feeling well and you are not experiencing any problems, there is no need to return our call.  We will assume that you have returned to your regular daily activities without incident.  If any biopsies were taken you will be contacted by phone or by letter within the next 1-3 weeks.  Please call us at [phone] if you have not heard about the biopsies in 3 weeks.    SIGNATURES/CONFIDENTIALITY: You and/or your care partner have signed paperwork which will be entered into your electronic medical record.  These signatures attest to the fact that that the information above on your After Visit Summary has been reviewed and is understood.  Full responsibility of the confidentiality of this discharge information lies with you and/or your care-partner.

## 2015-03-10 NOTE — Progress Notes (Signed)
Called to room to assist during endoscopic procedure.  Patient ID and intended procedure confirmed with present staff. Received instructions for my participation in the procedure from the performing physician.  

## 2015-03-11 ENCOUNTER — Telehealth: Payer: Self-pay

## 2015-03-11 NOTE — Telephone Encounter (Signed)
Left message on answering machine. 

## 2015-03-14 ENCOUNTER — Encounter: Payer: Self-pay | Admitting: Gastroenterology

## 2015-03-25 DIAGNOSIS — M79672 Pain in left foot: Secondary | ICD-10-CM | POA: Diagnosis not present

## 2015-03-28 DIAGNOSIS — M25562 Pain in left knee: Secondary | ICD-10-CM | POA: Diagnosis not present

## 2015-03-28 DIAGNOSIS — Z471 Aftercare following joint replacement surgery: Secondary | ICD-10-CM | POA: Diagnosis not present

## 2015-03-28 DIAGNOSIS — Z96611 Presence of right artificial shoulder joint: Secondary | ICD-10-CM | POA: Diagnosis not present

## 2015-04-01 ENCOUNTER — Encounter: Payer: Self-pay | Admitting: Family Medicine

## 2015-04-01 ENCOUNTER — Ambulatory Visit (INDEPENDENT_AMBULATORY_CARE_PROVIDER_SITE_OTHER): Payer: Medicare Other | Admitting: Family Medicine

## 2015-04-01 VITALS — BP 135/80 | HR 91 | Temp 98.0°F | Ht 66.0 in | Wt 195.0 lb

## 2015-04-01 DIAGNOSIS — N183 Chronic kidney disease, stage 3 unspecified: Secondary | ICD-10-CM

## 2015-04-01 DIAGNOSIS — M10062 Idiopathic gout, left knee: Secondary | ICD-10-CM

## 2015-04-01 DIAGNOSIS — M109 Gout, unspecified: Secondary | ICD-10-CM

## 2015-04-01 LAB — BASIC METABOLIC PANEL
BUN: 41 mg/dL — ABNORMAL HIGH (ref 6–23)
CHLORIDE: 104 meq/L (ref 96–112)
CO2: 30 meq/L (ref 19–32)
Calcium: 9.5 mg/dL (ref 8.4–10.5)
Creatinine, Ser: 1.89 mg/dL — ABNORMAL HIGH (ref 0.40–1.50)
GFR: 37.13 mL/min — ABNORMAL LOW (ref >60.00)
GLUCOSE: 75 mg/dL (ref 70–99)
POTASSIUM: 5 meq/L (ref 3.5–5.1)
SODIUM: 140 meq/L (ref 135–145)

## 2015-04-01 LAB — URIC ACID: URIC ACID, SERUM: 7.1 mg/dL (ref 4.0–7.8)

## 2015-04-01 MED ORDER — PREDNISONE 10 MG PO TABS: ORAL_TABLET | ORAL | Status: DC

## 2015-04-01 MED ORDER — HYDROCODONE-ACETAMINOPHEN 10-325 MG PO TABS: 1.0000 | ORAL_TABLET | Freq: Four times a day (QID) | ORAL | Status: DC | PRN

## 2015-04-01 NOTE — Progress Notes (Signed)
Pre visit review using our clinic review tool, if applicable. No additional management support is needed unless otherwise documented below in the visit note. 

## 2015-04-01 NOTE — Progress Notes (Signed)
   Subjective:    Patient ID: [name], male    DOB: 09-Oct-1940, 75 y.o.   [name]  HPI Here for pain in the left knee. No recent trauma. The posterior knee began to be very painful about 2 weeks ago. No swelling or warmth or redness. Advil has not helped. He saw Dr. Tommie Raymond at [facility] last week and he was diagnosed with gout. He as never had a gout attack before to his knowledge. He was given a steroid injection into the knee but this did not help at all. Now he has a lot of pain and walking is difficult.    Review of Systems  Constitutional: Negative.   Musculoskeletal: Positive for arthralgias and gait problem.  Skin: Negative for color change and rash.       Objective:   Physical Exam  Constitutional: He appears well-developed and well-nourished.  In pain, limping  Musculoskeletal:  The left knee appears normal with no swelling or warmth or erythema. He is tender along the lateral joint space and in the popliteal space           Assessment & Plan:  Knee pain, possible gout. Get a uric acid level today. Treat with a prednisone taper and use Norco for pain.

## 2015-04-04 ENCOUNTER — Encounter: Payer: Self-pay | Admitting: Internal Medicine

## 2015-04-04 ENCOUNTER — Ambulatory Visit (INDEPENDENT_AMBULATORY_CARE_PROVIDER_SITE_OTHER): Payer: Medicare Other | Admitting: Internal Medicine

## 2015-04-04 VITALS — BP 120/80 | HR 103 | Temp 98.1°F | Wt 200.0 lb

## 2015-04-04 DIAGNOSIS — M25562 Pain in left knee: Secondary | ICD-10-CM | POA: Diagnosis not present

## 2015-04-04 DIAGNOSIS — N183 Chronic kidney disease, stage 3 unspecified: Secondary | ICD-10-CM

## 2015-04-04 DIAGNOSIS — M810 Age-related osteoporosis without current pathological fracture: Secondary | ICD-10-CM | POA: Diagnosis not present

## 2015-04-04 NOTE — Progress Notes (Signed)
Pre visit review using our clinic review tool, if applicable. No additional management support is needed unless otherwise documented below in the visit note. 

## 2015-04-04 NOTE — Progress Notes (Signed)
Subjective:    Patient ID: [name], male    DOB: 12/17/1940, 75 y.o.   [name]  HPI  75 year old white male with history of bipolar disorder, chronic renal insufficiency and osteoporosis for follow-up. Patient seen 3 days ago for acute left knee pain. Patient reports he was also seen by his orthopedic specialist who performed x-ray of left knee. Left knee x-ray showed possible crystals. Presumptive diagnosis of gout was made. Patient currently taking prednisone taper. He notes mild improvement in symptoms.  No previous hx of gout.  Patient's uric acid level - 7.1  Patient followed by nephrologist for chronic low sufficiency. He did not discuss treatment options for osteoporosis at his last visit.  Review of Systems Negative for fever or chills,  No recent surgery or dental procedure    Past Medical History  Diagnosis Date  . Bipolar 1 disorder ([place])     takes Abilify daily  . Depression     takes Parnate daily  . Headache(784.0)     occasionally  . Swelling     right hand  . Joint pain   . Joint swelling   . Back pain     reason unknown  . History of colon polyps   . Peripheral edema     takes Lasix daily   . Osteoporosis     takes Drisdol weekly and Caltrate daily  . CKD (chronic kidney disease)     (Dr. Erling Cruz)  . Sleep apnea     wears c-pap  . Arthritis     back    Social History   Social History  . Marital Status: Divorced    Spouse Name: N/A  . Number of Children: N/A  . Years of Education: N/A   Occupational History  . Not on file.   Social History Main Topics  . Smoking status: Never Smoker   . Smokeless tobacco: Never Used  . Alcohol Use: No  . Drug Use: No  . Sexual Activity: Yes   Other Topics Concern  . Not on file   Social History Narrative    Past Surgical History  Procedure Laterality Date  . Colonoscopy w/ polypectomy  09/07/2002  . Tonsillectomy    . Knee surgery Right   . Reverse shoulder arthroplasty  Right 05/22/2013    Procedure: REVERSE SHOULDER ARTHROPLASTY;  Surgeon: Nita Sells, MD;  Location: [facility];  Service: Orthopedics;  Laterality: Right;  Right reverse total shoulder  . Colonoscopy      Family History  Problem Relation Age of Onset  . Cancer Mother   . Stroke Father   . Colon cancer Neg Hx   . Esophageal cancer Neg Hx   . Rectal cancer Neg Hx   . Stomach cancer Neg Hx     No Known Allergies  Current Outpatient Prescriptions on File Prior to Visit  Medication Sig Dispense Refill  . ARIPiprazole (ABILIFY) 5 MG tablet Take 1 tablet (5 mg total) by mouth daily. (Patient taking differently: Take 2.5 mg by mouth daily. ) 90 tablet 3  . Calcium-Vitamin D (CALTRATE 600 PLUS-VIT D PO) Take 1 tablet by mouth 2 (two) times daily.     . Cholecalciferol (VITAMIN D) 2000 UNITS CAPS Take 1 capsule (2,000 Units total) by mouth daily. 30 capsule   . HYDROcodone-acetaminophen (NORCO) 10-325 MG tablet Take 1 tablet by mouth every 6 (six) hours as needed for severe pain. 60 tablet 0  . l-methylfolate-B6-B12 (METANX) 3-35-2 MG TABS tablet  Take 1 tablet by mouth daily. Reported on 04/01/2015    . MYRBETRIQ 25 MG TB24 tablet Take 25 mg by mouth daily.   0  . predniSONE (DELTASONE) 10 MG tablet Take 4 tabs a day for 4 days, then 3 a day for 4 days, then 2 a day for 4 days, then 1 a day for 4 days, then stop 60 tablet 0  . tranylcypromine (PARNATE) 10 MG tablet Take 30 mg by mouth 2 (two) times daily.     . vitamin B-12 (CYANOCOBALAMIN) 1000 MCG tablet Take 1,000 mcg by mouth daily. Reported on 04/01/2015     No current facility-administered medications on file prior to visit.    BP 120/80 mmHg  Pulse 103  Temp(Src) 98.1 F (36.7 C) (Oral)  Wt 200 lb (90.719 kg)    Objective:   Physical Exam  Constitutional: He is oriented to person, place, and time. He appears well-developed and well-nourished. No distress.  HENT:  Head: Normocephalic and atraumatic.  Negative for  conjunctival petechiae  Cardiovascular: Normal rate, regular rhythm and normal heart sounds.   No murmur heard. Pulmonary/Chest: Effort normal and breath sounds normal.  Musculoskeletal:  Left knee-no significant joint swelling, no redness  Neurological: He is alert and oriented to person, place, and time.  Skin: Skin is warm and dry.  Psychiatric: He has a normal mood and affect. His behavior is normal.        Assessment & Plan:    1.  Left knee pain - question crystal arthropathy (gout vs pseudogout) 2.  Osteoporosis 3.  Chronic renal insufficiency - moderate  Refer to Dr. Tamala Julian for possible joint aspiration and ultrasound.  Continue prednisone taper for now.   Obtain input from nephrologist regarding treatment options

## 2015-04-16 ENCOUNTER — Ambulatory Visit: Payer: Medicare Other | Admitting: Family Medicine

## 2015-04-24 DIAGNOSIS — M5442 Lumbago with sciatica, left side: Secondary | ICD-10-CM | POA: Diagnosis not present

## 2015-04-24 DIAGNOSIS — M5441 Lumbago with sciatica, right side: Secondary | ICD-10-CM | POA: Diagnosis not present

## 2015-04-24 DIAGNOSIS — M545 Low back pain: Secondary | ICD-10-CM | POA: Diagnosis not present

## 2015-04-25 DIAGNOSIS — M4806 Spinal stenosis, lumbar region: Secondary | ICD-10-CM | POA: Diagnosis not present

## 2015-05-06 ENCOUNTER — Encounter: Payer: Self-pay | Admitting: Gastroenterology

## 2015-05-07 ENCOUNTER — Other Ambulatory Visit (INDEPENDENT_AMBULATORY_CARE_PROVIDER_SITE_OTHER): Payer: Medicare Other

## 2015-05-07 DIAGNOSIS — Z0000 Encounter for general adult medical examination without abnormal findings: Secondary | ICD-10-CM | POA: Diagnosis not present

## 2015-05-07 DIAGNOSIS — Z125 Encounter for screening for malignant neoplasm of prostate: Secondary | ICD-10-CM | POA: Diagnosis not present

## 2015-05-07 LAB — CBC WITH DIFFERENTIAL/PLATELET
BASOS ABS: 0.1 10*3/uL (ref 0.0–0.1)
Basophils Relative: 0.6 % (ref 0.0–3.0)
Eosinophils Absolute: 0.2 10*3/uL (ref 0.0–0.7)
Eosinophils Relative: 1.4 % (ref 0.0–5.0)
HCT: 38.5 % — ABNORMAL LOW (ref 39.0–52.0)
Hemoglobin: 13 g/dL (ref 13.0–17.0)
LYMPHS ABS: 1.9 10*3/uL (ref 0.7–4.0)
Lymphocytes Relative: 15.1 % (ref 12.0–46.0)
MCHC: 33.7 g/dL (ref 30.0–36.0)
MCV: 93.6 fl (ref 78.0–100.0)
MONO ABS: 1.3 10*3/uL — AB (ref 0.1–1.0)
Monocytes Relative: 10 % (ref 3.0–12.0)
NEUTROS PCT: 72.9 % (ref 43.0–77.0)
Neutro Abs: 9.2 10*3/uL — ABNORMAL HIGH (ref 1.4–7.7)
PLATELETS: 262 10*3/uL (ref 150.0–400.0)
RBC: 4.12 Mil/uL — AB (ref 4.22–5.81)
RDW: 13.6 % (ref 11.5–15.5)
WBC: 12.7 10*3/uL — ABNORMAL HIGH (ref 4.0–10.5)

## 2015-05-07 LAB — HEPATIC FUNCTION PANEL
ALBUMIN: 3.6 g/dL (ref 3.5–5.2)
ALK PHOS: 57 U/L (ref 39–117)
ALT: 14 U/L (ref 0–53)
AST: 11 U/L (ref 0–37)
Bilirubin, Direct: 0.1 mg/dL (ref 0.0–0.3)
Total Bilirubin: 0.4 mg/dL (ref 0.2–1.2)
Total Protein: 5.8 g/dL — ABNORMAL LOW (ref 6.0–8.3)

## 2015-05-07 LAB — LIPID PANEL
CHOL/HDL RATIO: 2
Cholesterol: 152 mg/dL (ref 0–200)
HDL: 65.2 mg/dL (ref >39.00)
LDL CALC: 54 mg/dL (ref 0–99)
NonHDL: 86.69
TRIGLYCERIDES: 164 mg/dL — AB (ref 0.0–149.0)
VLDL: 32.8 mg/dL (ref 0.0–40.0)

## 2015-05-07 LAB — PSA: PSA: 0.62 ng/mL (ref 0.10–4.00)

## 2015-05-07 LAB — POC URINALSYSI DIPSTICK (AUTOMATED)
BILIRUBIN UA: NEGATIVE
GLUCOSE UA: NEGATIVE
Ketones, UA: NEGATIVE
Leukocytes, UA: NEGATIVE
Nitrite, UA: NEGATIVE
Protein, UA: NEGATIVE
RBC UA: NEGATIVE
SPEC GRAV UA: 1.015
Urobilinogen, UA: 0.2
pH, UA: 6.5

## 2015-05-07 LAB — BASIC METABOLIC PANEL
BUN: 45 mg/dL — AB (ref 6–23)
CALCIUM: 9 mg/dL (ref 8.4–10.5)
CO2: 30 mEq/L (ref 19–32)
Chloride: 103 mEq/L (ref 96–112)
Creatinine, Ser: 2.26 mg/dL — ABNORMAL HIGH (ref 0.40–1.50)
GFR: 30.2 mL/min — AB (ref >60.00)
Glucose, Bld: 90 mg/dL (ref 70–99)
POTASSIUM: 5.1 meq/L (ref 3.5–5.1)
SODIUM: 140 meq/L (ref 135–145)

## 2015-05-07 LAB — TSH: TSH: 1.92 u[IU]/mL (ref 0.35–4.50)

## 2015-05-12 ENCOUNTER — Ambulatory Visit (INDEPENDENT_AMBULATORY_CARE_PROVIDER_SITE_OTHER): Payer: Medicare Other | Admitting: Family Medicine

## 2015-05-12 ENCOUNTER — Encounter: Payer: Self-pay | Admitting: Family Medicine

## 2015-05-12 VITALS — BP 122/79 | HR 97 | Temp 98.6°F | Ht 66.0 in | Wt 196.0 lb

## 2015-05-12 DIAGNOSIS — Z0000 Encounter for general adult medical examination without abnormal findings: Secondary | ICD-10-CM

## 2015-05-12 NOTE — Progress Notes (Signed)
   Subjective:    Patient ID: [name], male    DOB: Sep 19, 1940, 75 y.o.   MRN: [id number]  HPI 75 yr old male for a cpx. He has been doing well except for chronic pain in the feet, knees, and back. He just finished a course of prednisone and this helped his knee. He is scheduled for a nerve ablation at [facility] next week. His labs were remakrbale primarily for a rise in the creatitine to 2.26, and in fact he will be seeing Dr. Florene Glen about this on 05-22-15.    Review of Systems  Constitutional: Negative.   HENT: Negative.   Eyes: Negative.   Respiratory: Negative.   Cardiovascular: Negative.   Gastrointestinal: Negative.   Genitourinary: Negative.   Musculoskeletal: Positive for back pain and arthralgias.  Skin: Negative.   Neurological: Negative.   Psychiatric/Behavioral: Negative.        Objective:   Physical Exam  Constitutional: He is oriented to person, place, and time. He appears well-developed and well-nourished. No distress.  HENT:  Head: Normocephalic and atraumatic.  Right Ear: External ear normal.  Left Ear: External ear normal.  Nose: Nose normal.  Mouth/Throat: Oropharynx is clear and moist. No oropharyngeal exudate.  Eyes: Conjunctivae and EOM are normal. Pupils are equal, round, and reactive to light. Right eye exhibits no discharge. Left eye exhibits no discharge. No scleral icterus.  Neck: Neck supple. No JVD present. No tracheal deviation present. No thyromegaly present.  Cardiovascular: Normal rate, regular rhythm, normal heart sounds and intact distal pulses.  Exam reveals no gallop and no friction rub.   No murmur heard. EKG normal   Pulmonary/Chest: Effort normal and breath sounds normal. No respiratory distress. He has no wheezes. He has no rales. He exhibits no tenderness.  Abdominal: Soft. Bowel sounds are normal. He exhibits no distension and no mass. There is no tenderness. There is no rebound and no guarding.  Genitourinary: Rectum  normal, prostate normal and penis normal. Guaiac negative stool. No penile tenderness.  Musculoskeletal: Normal range of motion. He exhibits no edema or tenderness.  Lymphadenopathy:    He has no cervical adenopathy.  Neurological: He is alert and oriented to person, place, and time. He has normal reflexes. No cranial nerve deficit. He exhibits normal muscle tone. Coordination normal.  Skin: Skin is warm and dry. No rash noted. He is not diaphoretic. No erythema. No pallor.  Psychiatric: He has a normal mood and affect. His behavior is normal. Judgment and thought content normal.          Assessment & Plan:  Well exam. We discussed diet and exercise. He has occasional muscle cramps so I suggested he try taking 400 mg of magnesium daily.

## 2015-05-12 NOTE — Progress Notes (Signed)
Pre visit review using our clinic review tool, if applicable. No additional management support is needed unless otherwise documented below in the visit note. 

## 2015-05-13 DIAGNOSIS — M47816 Spondylosis without myelopathy or radiculopathy, lumbar region: Secondary | ICD-10-CM | POA: Diagnosis not present

## 2015-05-20 DIAGNOSIS — M47816 Spondylosis without myelopathy or radiculopathy, lumbar region: Secondary | ICD-10-CM | POA: Diagnosis not present

## 2015-05-22 DIAGNOSIS — N319 Neuromuscular dysfunction of bladder, unspecified: Secondary | ICD-10-CM | POA: Diagnosis not present

## 2015-05-22 DIAGNOSIS — N183 Chronic kidney disease, stage 3 (moderate): Secondary | ICD-10-CM | POA: Diagnosis not present

## 2015-05-26 ENCOUNTER — Encounter: Payer: Self-pay | Admitting: Family Medicine

## 2015-05-26 ENCOUNTER — Ambulatory Visit (INDEPENDENT_AMBULATORY_CARE_PROVIDER_SITE_OTHER): Payer: Medicare Other | Admitting: Family Medicine

## 2015-05-26 VITALS — BP 132/76 | HR 90 | Temp 97.9°F | Ht 66.0 in | Wt 198.0 lb

## 2015-05-26 DIAGNOSIS — K5732 Diverticulitis of large intestine without perforation or abscess without bleeding: Secondary | ICD-10-CM | POA: Diagnosis not present

## 2015-05-26 DIAGNOSIS — B029 Zoster without complications: Secondary | ICD-10-CM | POA: Diagnosis not present

## 2015-05-26 MED ORDER — VALACYCLOVIR HCL 1 G PO TABS: 1000.0000 mg | ORAL_TABLET | Freq: Three times a day (TID) | ORAL | Status: DC

## 2015-05-26 MED ORDER — METRONIDAZOLE 500 MG PO TABS: 500.0000 mg | ORAL_TABLET | Freq: Three times a day (TID) | ORAL | Status: DC

## 2015-05-26 MED ORDER — CIPROFLOXACIN HCL 500 MG PO TABS: 500.0000 mg | ORAL_TABLET | Freq: Two times a day (BID) | ORAL | Status: DC

## 2015-05-26 MED ORDER — METHYLPREDNISOLONE 4 MG PO TBPK: ORAL_TABLET | ORAL | Status: DC

## 2015-05-26 NOTE — Progress Notes (Signed)
Pre visit review using our clinic review tool, if applicable. No additional management support is needed unless otherwise documented below in the visit note. 

## 2015-05-27 ENCOUNTER — Encounter: Payer: Self-pay | Admitting: Family Medicine

## 2015-05-27 DIAGNOSIS — K5732 Diverticulitis of large intestine without perforation or abscess without bleeding: Secondary | ICD-10-CM | POA: Insufficient documentation

## 2015-05-27 DIAGNOSIS — B029 Zoster without complications: Secondary | ICD-10-CM

## 2015-05-27 HISTORY — DX: Zoster without complications: B02.9

## 2015-05-27 HISTORY — DX: Diverticulitis of large intestine without perforation or abscess without bleeding: K57.32

## 2015-05-27 NOTE — Progress Notes (Signed)
   Subjective:    Patient ID: [name], male    DOB: Nov 16, 1940, 75 y.o.   [name]  HPI Here for 2 different sets of symptoms. Both of these started 3 days ago. First he developed burning and itching in the right forehead and now he has a rash in this area that appeared yesterday. No eye pain and no vision disturbance. Second he developed pain in the LLQ. His BMs are regular and he has no urinary symptoms. No nausea or fever. Of note he had extensive diverticulae on his recent colonoscopy.    Review of Systems  Constitutional: Negative.   HENT: Negative.   Eyes: Negative.   Respiratory: Negative.   Cardiovascular: Negative.   Gastrointestinal: Positive for abdominal pain. Negative for nausea, vomiting, diarrhea, constipation, blood in stool, abdominal distention, anal bleeding and rectal pain.  Skin: Positive for rash.       Objective:   Physical Exam  Constitutional: He is oriented to person, place, and time. He appears well-developed and well-nourished.  HENT:  Right Ear: External ear normal.  Left Ear: External ear normal.  Nose: Nose normal.  Mouth/Throat: Oropharynx is clear and moist.  The right forehead has a patch of red vesicles   Eyes: Conjunctivae and EOM are normal. Pupils are equal, round, and reactive to light.  Pulmonary/Chest: Effort normal and breath sounds normal.  Abdominal: Soft. Bowel sounds are normal. He exhibits no distension and no mass. There is no rebound and no guarding.  Tender in the LLQ   Neurological: He is alert and oriented to person, place, and time.          Assessment & Plan:  He has 2 simultaneous problems. First he has shingles and we will treat with Valtrex and a Medrol dose pack. Second he as diverticulitis, and we will treat with Cipro and Flagyl. Drink plenty of fluids. Recheck in one week. He knows to seek immediate attention if he develops any eye pain or blurred vision.

## 2015-06-03 DIAGNOSIS — M722 Plantar fascial fibromatosis: Secondary | ICD-10-CM | POA: Diagnosis not present

## 2015-06-03 DIAGNOSIS — G5762 Lesion of plantar nerve, left lower limb: Secondary | ICD-10-CM | POA: Diagnosis not present

## 2015-06-09 DIAGNOSIS — M47816 Spondylosis without myelopathy or radiculopathy, lumbar region: Secondary | ICD-10-CM | POA: Diagnosis not present

## 2015-06-17 DIAGNOSIS — M79672 Pain in left foot: Secondary | ICD-10-CM | POA: Diagnosis not present

## 2015-06-17 DIAGNOSIS — M25561 Pain in right knee: Secondary | ICD-10-CM | POA: Diagnosis not present

## 2015-06-18 DIAGNOSIS — H52223 Regular astigmatism, bilateral: Secondary | ICD-10-CM | POA: Diagnosis not present

## 2015-06-18 DIAGNOSIS — H5213 Myopia, bilateral: Secondary | ICD-10-CM | POA: Diagnosis not present

## 2015-06-18 DIAGNOSIS — H524 Presbyopia: Secondary | ICD-10-CM | POA: Diagnosis not present

## 2015-06-25 ENCOUNTER — Encounter: Payer: Self-pay | Admitting: Family Medicine

## 2015-06-25 ENCOUNTER — Ambulatory Visit (INDEPENDENT_AMBULATORY_CARE_PROVIDER_SITE_OTHER): Payer: Medicare Other | Admitting: Family Medicine

## 2015-06-25 VITALS — BP 135/75 | HR 83 | Temp 97.5°F | Ht 66.0 in | Wt 205.0 lb

## 2015-06-25 DIAGNOSIS — M25572 Pain in left ankle and joints of left foot: Secondary | ICD-10-CM

## 2015-06-25 NOTE — Progress Notes (Signed)
Pre visit review using our clinic review tool, if applicable. No additional management support is needed unless otherwise documented below in the visit note. 

## 2015-06-25 NOTE — Progress Notes (Signed)
   Subjective:    Patient ID: [name], male    DOB: [birth year], 75 y.o.   [name]  HPI Here for a second opinion on his left foot and ankle pain. He began having a sharp pain in the dorsal left foot in October, and he thinks this was the result of lifting weights in the gym. He started seeing Dr. Dorna Leitz for this and was diagnosed with a fracture of a foot bone and a Mortons neuroma. He has had several cortisone shots in the foot and has worn a boot for months, but nothing seems to help. He deals with significant pain every day and he is frustrated since he is not improving. He does ice it and elevated it daily, and he takes Tylenol. He cannot take NSAIDs due to some renal insufficiency.    Review of Systems  Constitutional: Negative.   Respiratory: Negative.   Cardiovascular: Negative.   Musculoskeletal: Positive for joint swelling, arthralgias and gait problem.       Objective:   Physical Exam  Constitutional:  In pain, limping   Musculoskeletal:  The entire left foot is swollen and he is tender on the dorsum. ROM of the ankles is quite limited.           Assessment & Plan:  Chronic foot pain due to a fracture and a neuroma. Refer to Dr. Wylene Simmer for a second opinion.  Laurey Morale, MD

## 2015-06-30 DIAGNOSIS — M47816 Spondylosis without myelopathy or radiculopathy, lumbar region: Secondary | ICD-10-CM | POA: Diagnosis not present

## 2015-07-28 DIAGNOSIS — M722 Plantar fascial fibromatosis: Secondary | ICD-10-CM | POA: Diagnosis not present

## 2015-07-28 DIAGNOSIS — E890 Postprocedural hypothyroidism: Secondary | ICD-10-CM | POA: Diagnosis not present

## 2015-07-28 DIAGNOSIS — M79672 Pain in left foot: Secondary | ICD-10-CM | POA: Diagnosis not present

## 2015-08-04 DIAGNOSIS — M1711 Unilateral primary osteoarthritis, right knee: Secondary | ICD-10-CM | POA: Diagnosis not present

## 2015-08-05 DIAGNOSIS — M722 Plantar fascial fibromatosis: Secondary | ICD-10-CM | POA: Diagnosis not present

## 2015-08-12 ENCOUNTER — Ambulatory Visit: Payer: Medicare Other | Admitting: Pulmonary Disease

## 2015-08-15 DIAGNOSIS — M1711 Unilateral primary osteoarthritis, right knee: Secondary | ICD-10-CM | POA: Diagnosis not present

## 2015-08-15 DIAGNOSIS — M25572 Pain in left ankle and joints of left foot: Secondary | ICD-10-CM | POA: Diagnosis not present

## 2015-08-15 DIAGNOSIS — M79672 Pain in left foot: Secondary | ICD-10-CM | POA: Diagnosis not present

## 2015-08-22 DIAGNOSIS — M4806 Spinal stenosis, lumbar region: Secondary | ICD-10-CM | POA: Diagnosis not present

## 2015-08-28 DIAGNOSIS — M1711 Unilateral primary osteoarthritis, right knee: Secondary | ICD-10-CM | POA: Diagnosis not present

## 2015-08-28 DIAGNOSIS — M79672 Pain in left foot: Secondary | ICD-10-CM | POA: Diagnosis not present

## 2015-09-03 DIAGNOSIS — M4806 Spinal stenosis, lumbar region: Secondary | ICD-10-CM | POA: Diagnosis not present

## 2015-09-04 DIAGNOSIS — L570 Actinic keratosis: Secondary | ICD-10-CM | POA: Diagnosis not present

## 2015-09-04 DIAGNOSIS — L814 Other melanin hyperpigmentation: Secondary | ICD-10-CM | POA: Diagnosis not present

## 2015-09-04 DIAGNOSIS — L820 Inflamed seborrheic keratosis: Secondary | ICD-10-CM | POA: Diagnosis not present

## 2015-09-04 DIAGNOSIS — L218 Other seborrheic dermatitis: Secondary | ICD-10-CM | POA: Diagnosis not present

## 2015-09-04 DIAGNOSIS — M1711 Unilateral primary osteoarthritis, right knee: Secondary | ICD-10-CM | POA: Diagnosis not present

## 2015-09-05 DIAGNOSIS — N401 Enlarged prostate with lower urinary tract symptoms: Secondary | ICD-10-CM | POA: Diagnosis not present

## 2015-09-11 DIAGNOSIS — M25572 Pain in left ankle and joints of left foot: Secondary | ICD-10-CM | POA: Diagnosis not present

## 2015-09-16 DIAGNOSIS — M1711 Unilateral primary osteoarthritis, right knee: Secondary | ICD-10-CM | POA: Diagnosis not present

## 2015-09-30 ENCOUNTER — Telehealth: Payer: Self-pay | Admitting: Internal Medicine

## 2015-09-30 NOTE — Telephone Encounter (Signed)
Pt has seen you the past year since dr Shawna Orleans has been out.  Pt would like to know if you will continue to   see him or does he need to establish with another provider.

## 2015-10-06 NOTE — Telephone Encounter (Signed)
Yes I can see him thanks  

## 2015-10-07 ENCOUNTER — Encounter: Payer: Self-pay | Admitting: Family Medicine

## 2015-10-07 ENCOUNTER — Ambulatory Visit (INDEPENDENT_AMBULATORY_CARE_PROVIDER_SITE_OTHER): Payer: Medicare Other | Admitting: Family Medicine

## 2015-10-07 VITALS — BP 119/76 | HR 86 | Temp 98.3°F | Ht 66.0 in | Wt 198.0 lb

## 2015-10-07 DIAGNOSIS — N183 Chronic kidney disease, stage 3 unspecified: Secondary | ICD-10-CM

## 2015-10-07 DIAGNOSIS — I10 Essential (primary) hypertension: Secondary | ICD-10-CM

## 2015-10-07 DIAGNOSIS — M25569 Pain in unspecified knee: Secondary | ICD-10-CM

## 2015-10-07 DIAGNOSIS — M25572 Pain in left ankle and joints of left foot: Secondary | ICD-10-CM | POA: Diagnosis not present

## 2015-10-07 DIAGNOSIS — M25579 Pain in unspecified ankle and joints of unspecified foot: Secondary | ICD-10-CM | POA: Insufficient documentation

## 2015-10-07 LAB — BASIC METABOLIC PANEL
BUN: 34 mg/dL — AB (ref 6–23)
CO2: 29 meq/L (ref 19–32)
Calcium: 9.4 mg/dL (ref 8.4–10.5)
Chloride: 104 mEq/L (ref 96–112)
Creatinine, Ser: 2.08 mg/dL — ABNORMAL HIGH (ref 0.40–1.50)
GFR: 33.2 mL/min — AB (ref >60.00)
GLUCOSE: 91 mg/dL (ref 70–99)
POTASSIUM: 4.6 meq/L (ref 3.5–5.1)
Sodium: 138 mEq/L (ref 135–145)

## 2015-10-07 MED ORDER — ACETAMINOPHEN 500 MG PO TABS: 1000.0000 mg | ORAL_TABLET | Freq: Two times a day (BID) | ORAL | Status: DC

## 2015-10-07 NOTE — Progress Notes (Signed)
Pre visit review using our clinic review tool, if applicable. No additional management support is needed unless otherwise documented below in the visit note. 

## 2015-10-07 NOTE — Telephone Encounter (Signed)
Tell him his renal function is stable

## 2015-10-07 NOTE — Progress Notes (Signed)
   Subjective:    Patient ID: [name], male    DOB: 1940-04-02, 75 y.o.   [name]  HPI Here asking for advice about chronic right knee pain and left foot pain. He has diffuse osteoarthritis, and he has seen Dr. Berenice Primas and Dr. Doran Durand about this. He had injections to the knee but these did not help much. Dr. Doran Durand recommended PT for the foot but he declined to try this. He cannot take NSAIDs due to his kidney disease and Tramadol made him very nauseated and sick. He is scheduled to see Dr. Berenice Primas again next week.    Review of Systems  Constitutional: Negative.   Respiratory: Negative.   Cardiovascular: Negative.   Musculoskeletal: Positive for back pain, arthralgias and gait problem.  Neurological: Negative.        Objective:   Physical Exam  Constitutional: He is oriented to person, place, and time. He appears well-developed and well-nourished.  Limping in pain  Cardiovascular: Normal rate, regular rhythm, normal heart sounds and intact distal pulses.   Pulmonary/Chest: Effort normal and breath sounds normal.  Neurological: He is alert and oriented to person, place, and time.          Assessment & Plan:  For pain relief I suggested he try ES Tylenol up to 1000 mg BID. He will speak to Dr. Berenice Primas about the possibility of a knee replacement surgery. It looks like not much can be done about the foot pain, although I do think this may improve if he can get the knee repaired. Check a BMET today for the renal function.  Laurey Morale, MD

## 2015-10-08 NOTE — Telephone Encounter (Signed)
I spoke with pt  

## 2015-10-30 DIAGNOSIS — M1711 Unilateral primary osteoarthritis, right knee: Secondary | ICD-10-CM | POA: Diagnosis not present

## 2015-10-30 DIAGNOSIS — M19072 Primary osteoarthritis, left ankle and foot: Secondary | ICD-10-CM | POA: Diagnosis not present

## 2015-11-14 DIAGNOSIS — M25572 Pain in left ankle and joints of left foot: Secondary | ICD-10-CM | POA: Diagnosis not present

## 2015-11-14 DIAGNOSIS — M19072 Primary osteoarthritis, left ankle and foot: Secondary | ICD-10-CM | POA: Diagnosis not present

## 2015-11-14 DIAGNOSIS — M19071 Primary osteoarthritis, right ankle and foot: Secondary | ICD-10-CM | POA: Diagnosis not present

## 2015-11-14 DIAGNOSIS — M25571 Pain in right ankle and joints of right foot: Secondary | ICD-10-CM | POA: Diagnosis not present

## 2015-11-25 ENCOUNTER — Other Ambulatory Visit: Payer: Self-pay | Admitting: Orthopedic Surgery

## 2015-11-26 DIAGNOSIS — M19071 Primary osteoarthritis, right ankle and foot: Secondary | ICD-10-CM | POA: Diagnosis not present

## 2015-11-26 DIAGNOSIS — M25572 Pain in left ankle and joints of left foot: Secondary | ICD-10-CM | POA: Diagnosis not present

## 2015-11-26 DIAGNOSIS — M25571 Pain in right ankle and joints of right foot: Secondary | ICD-10-CM | POA: Diagnosis not present

## 2015-11-26 DIAGNOSIS — M19072 Primary osteoarthritis, left ankle and foot: Secondary | ICD-10-CM | POA: Diagnosis not present

## 2015-11-27 ENCOUNTER — Encounter (HOSPITAL_COMMUNITY)
Admission: RE | Admit: 2015-11-27 | Discharge: 2015-11-27 | Disposition: A | Discharge status: Home / Self Care | Payer: Medicare Other | Source: Ambulatory Visit | Attending: Orthopedic Surgery | Admitting: Orthopedic Surgery

## 2015-11-27 ENCOUNTER — Encounter (HOSPITAL_COMMUNITY): Payer: Self-pay

## 2015-11-27 DIAGNOSIS — Z0183 Encounter for blood typing: Secondary | ICD-10-CM | POA: Insufficient documentation

## 2015-11-27 DIAGNOSIS — R609 Edema, unspecified: Secondary | ICD-10-CM | POA: Insufficient documentation

## 2015-11-27 DIAGNOSIS — M810 Age-related osteoporosis without current pathological fracture: Secondary | ICD-10-CM | POA: Diagnosis not present

## 2015-11-27 DIAGNOSIS — F319 Bipolar disorder, unspecified: Secondary | ICD-10-CM | POA: Insufficient documentation

## 2015-11-27 DIAGNOSIS — Z01812 Encounter for preprocedural laboratory examination: Secondary | ICD-10-CM | POA: Insufficient documentation

## 2015-11-27 DIAGNOSIS — G4733 Obstructive sleep apnea (adult) (pediatric): Secondary | ICD-10-CM | POA: Insufficient documentation

## 2015-11-27 DIAGNOSIS — Z79899 Other long term (current) drug therapy: Secondary | ICD-10-CM | POA: Diagnosis not present

## 2015-11-27 DIAGNOSIS — Z01818 Encounter for other preprocedural examination: Secondary | ICD-10-CM | POA: Diagnosis not present

## 2015-11-27 DIAGNOSIS — I517 Cardiomegaly: Secondary | ICD-10-CM | POA: Diagnosis not present

## 2015-11-27 DIAGNOSIS — M1711 Unilateral primary osteoarthritis, right knee: Secondary | ICD-10-CM | POA: Diagnosis not present

## 2015-11-27 DIAGNOSIS — N183 Chronic kidney disease, stage 3 (moderate): Secondary | ICD-10-CM | POA: Diagnosis not present

## 2015-11-27 LAB — CBC WITH DIFFERENTIAL/PLATELET
BASOS ABS: 0 10*3/uL (ref 0.0–0.1)
BASOS PCT: 0 %
EOS PCT: 5 %
Eosinophils Absolute: 0.3 10*3/uL (ref 0.0–0.7)
HCT: 39.5 % (ref 39.0–52.0)
Hemoglobin: 12.8 g/dL — ABNORMAL LOW (ref 13.0–17.0)
LYMPHS PCT: 23 %
Lymphs Abs: 1.4 10*3/uL (ref 0.7–4.0)
MCH: 31.5 pg (ref 26.0–34.0)
MCHC: 32.4 g/dL (ref 30.0–36.0)
MCV: 97.3 fL (ref 78.0–100.0)
Monocytes Absolute: 0.5 10*3/uL (ref 0.1–1.0)
Monocytes Relative: 7 %
Neutro Abs: 4 10*3/uL (ref 1.7–7.7)
Neutrophils Relative %: 65 %
PLATELETS: 173 10*3/uL (ref 150–400)
RBC: 4.06 MIL/uL — AB (ref 4.22–5.81)
RDW: 12.9 % (ref 11.5–15.5)
WBC: 6.2 10*3/uL (ref 4.0–10.5)

## 2015-11-27 LAB — COMPREHENSIVE METABOLIC PANEL
ALBUMIN: 3.8 g/dL (ref 3.5–5.0)
ALT: 26 U/L (ref 17–63)
AST: 29 U/L (ref 15–41)
Alkaline Phosphatase: 77 U/L (ref 38–126)
Anion gap: 7 (ref 5–15)
BUN: 29 mg/dL — AB (ref 6–20)
CHLORIDE: 108 mmol/L (ref 101–111)
CO2: 27 mmol/L (ref 22–32)
CREATININE: 2.19 mg/dL — AB (ref 0.61–1.24)
Calcium: 9.7 mg/dL (ref 8.9–10.3)
GFR calc Af Amer: 32 mL/min — ABNORMAL LOW (ref >60)
GFR, EST NON AFRICAN AMERICAN: 28 mL/min — AB (ref >60)
GLUCOSE: 120 mg/dL — AB (ref 65–99)
POTASSIUM: 4.5 mmol/L (ref 3.5–5.1)
SODIUM: 142 mmol/L (ref 135–145)
Total Bilirubin: 0.7 mg/dL (ref 0.3–1.2)
Total Protein: 6.1 g/dL — ABNORMAL LOW (ref 6.5–8.1)

## 2015-11-27 LAB — URINALYSIS, ROUTINE W REFLEX MICROSCOPIC
BILIRUBIN URINE: NEGATIVE
Glucose, UA: NEGATIVE mg/dL
Hgb urine dipstick: NEGATIVE
KETONES UR: NEGATIVE mg/dL
LEUKOCYTES UA: NEGATIVE
NITRITE: NEGATIVE
PROTEIN: NEGATIVE mg/dL
Specific Gravity, Urine: 1.016 (ref 1.005–1.030)
pH: 5.5 (ref 5.0–8.0)

## 2015-11-27 LAB — SURGICAL PCR SCREEN
MRSA, PCR: NEGATIVE
STAPHYLOCOCCUS AUREUS: NEGATIVE

## 2015-11-27 LAB — TYPE AND SCREEN
ABO/RH(D): A POS
ANTIBODY SCREEN: NEGATIVE

## 2015-11-27 LAB — PROTIME-INR
INR: 1.02
Prothrombin Time: 13.4 seconds (ref 11.4–15.2)

## 2015-11-27 LAB — APTT: APTT: 39 s — AB (ref 24–36)

## 2015-11-27 NOTE — Progress Notes (Signed)
[name], Derma called Nurse and stated she had spoken with Dr. Kalman Shan and Dr. Kalman Shan stated that patient did not have to stop taking Parnate (since he had not already stopped taking it), and that it was his choice if he wanted to take it day of surgery, but to hold Methylfolate Forte the day of surgery. Patient verbalized understanding, smiled, and thanked Marine scientist.

## 2015-11-27 NOTE — Progress Notes (Signed)
Anesthesia Note: Patient is a 75 year old male scheduled for right TKA on 12/08/15 by Dr. Berenice Primas. Anesthesia type is posted for Choice.  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 Jeffie Pollock), right reverse total shoulder 05/22/13.   - PCP is Dr. Alysia Penna.  - Nephrologist is Dr. Erling Cruz. - Psychiatrist is Dr. Norma Fredrickson.  Meds includes Abilify, Caltrate, L-Methylfolate-Algae, tranylcyromine (Parnate) which is an MAO inhibitor. He has been on Parnate for years and did not want to stop it in the past due to his depression. He was allowed to continue it for his shoulder surgery in 2015.    BP (!) 143/82   Pulse 86   Temp 36.4 C   Resp 20   Ht 5\' 6"  (1.676 m)   Wt 198 lb 9.6 oz (90.1 kg)   SpO2 98%   BMI 32.05 kg/m   05/12/15 EKG (done as part of CPE with Dr. Fry):SR, non-specific QRS widening, negative T wave in V1.  09/14/12 Echo:  - Left ventricle: The cavity size was normal. Wall thickness was increased in a pattern of mild LVH. Systolic function was normal. The estimated ejection fraction was in the range of 55% to 60%. Wall motion was normal; there were no regional wall motion abnormalities. Doppler parameters are consistent with abnormal left ventricular relaxation (grade 1 diastolic dysfunction). - Aortic valve: Trivial regurgitation. - Ascending aorta: The ascending aorta was mildly dilated. (Aortic root diameter 34 mm. AAo AP 38 mm.)   Preoperative labs noted. Cr 2.19, consistent with previous readings (Cr 2.08 10/07/15 and 2.26 on 05/07/15). H/H 12.8/39.5. INR 1.02, PTT 39. UA negative for leukocytes, nitrites.  I did update anesthesiologist Dr. Kalman Shan that patient was on an MAOI. No issues in the past with anesthesia while on. He can continue perioperatively--patient will decide whether he wants to take on  the morning of surgery or not. He will hold methylfolate on the morning of surgery. If no acute changes then it is anticipated that he can proceed as planned.  [name] [facility]/Anesthesiology Phone [phone] 11/27/2015 4:18 PM

## 2015-11-27 NOTE — Pre-Procedure Instructions (Signed)
[name]  11/27/2015     Your procedure is scheduled on : Monday December 08, 2015 at 1:00 PM.  Report to [facility] Admitting at 11:00 AM.  Call this number if you have problems the morning of surgery: [phone]    Remember:  Do not eat food or drink liquids after midnight.  Take these medicines the morning of surgery with A SIP OF WATER : Acetaminophen (Tylenol) if needed, Aripiprazole (Abilify), and Tranylcypromine (Parnate)   Stop taking any vitamins, herbal medications/supplements, NSAIDs, Ibuprofen, Advil, Motrin, Aleve, etc on Monday September 11th   Bring CPAP mask the day of your surgery   Do not wear jewelry.  Do not wear lotions, powders, or cologne.  Men may shave face and neck.  Do not bring valuables to the hospital.  [facility] is not responsible for any belongings or valuables.  Contacts, dentures or bridgework may not be worn into surgery.  Leave your suitcase in the car.  After surgery it may be brought to your room.  For patients admitted to the hospital, discharge time will be determined by your treatment team.  Patients discharged the day of surgery will not be allowed to drive home.   Name and phone number of your driver:    Special instructions:  Shower using CHG soap the night before and the morning of your surgery  Please read over the following fact sheets that you were given. Pain Booklet, Total Joint Packet, MRSA Information and Surgical Site Infection Prevention

## 2015-11-27 NOTE — Anesthesia Preprocedure Evaluation (Addendum)
Anesthesia Evaluation  Patient identified by MRN, date of birth, ID band Patient awake    Reviewed: Allergy & Precautions, NPO status , Patient's Chart, lab work & pertinent test results  History of Anesthesia Complications Negative for: history of anesthetic complications  Airway Mallampati: III  TM Distance: >3 FB Neck ROM: Full    Dental  (+) Teeth Intact, Dental Advisory Given   Pulmonary neg shortness of breath, sleep apnea and Continuous Positive Airway Pressure Ventilation , neg COPD, neg recent URI,    Pulmonary exam normal breath sounds clear to auscultation       Cardiovascular hypertension (patient denies), (-) angina(-) Past MI, (-) Cardiac Stents and (-) Orthopnea  Rhythm:Regular Rate:Normal  Chronic venous insufficiency  05/12/15 EKG (done as part of CPE with Dr. Fry):SR, non-specific QRS widening, negative T wave in V1.  09/14/12 Echo:  - Left ventricle: The cavity size was normal. Wall thickness was increased in a pattern of mild LVH. Systolic function was normal. The estimated ejection fraction was in the range of 55% to 60%. Wall motion was normal; there were no regional wall motion abnormalities. Doppler parameters are consistent with abnormal left ventricular relaxation (grade 1 diastolic dysfunction). - Aortic valve: Trivial regurgitation. - Ascending aorta: The ascending aorta was mildly dilated. (Aortic root diameter 34 mm. AAo AP 38 mm.)   Neuro/Psych  Headaches, PSYCHIATRIC DISORDERS Depression Bipolar Disorder CVA (patient denies)    GI/Hepatic neg GERD  ,Fatty liver Diverticulosis, IBS   Endo/Other  neg diabetesHypothyroidism   Renal/GU CRFRenal disease (baseline Cr ~2)   BPH    Musculoskeletal  (+) Arthritis , osteoporosis   Abdominal (+) + obese,   Peds  Hematology  (+) Blood dyscrasia (iron deficiency), anemia ,   Anesthesia Other Findings   Reproductive/Obstetrics                             Anesthesia Physical Anesthesia Plan  ASA: III  Anesthesia Plan: Regional and Spinal   Post-op Pain Management:  Regional for Post-op pain   Induction: Intravenous  Airway Management Planned: Natural Airway and Simple Face Mask  Additional Equipment:   Intra-op Plan:   Post-operative Plan: Extubation in OR  Informed Consent: I have reviewed the patients History and Physical, chart, labs and discussed the procedure including the risks, benefits and alternatives for the proposed anesthesia with the patient or authorized representative who has indicated his/her understanding and acceptance.   Dental advisory given  Plan Discussed with:   Anesthesia Plan Comments: (Please note that patient is on a MAO inhibitor. Myra Gianotti, PA-C  I have discussed risks of neuraxial anesthesia including but not limited to infection, bleeding, nerve injury, back pain, headache, seizures, and failure of block. Patient denies bleeding disorders and is not currently anticoagulated. Labs have been reviewed. Risks and benefits discussed. All patient's questions answered.   Discussed potential risks of nerve blocks including, but not limited to, infection, bleeding, nerve damage, seizures, pneumothorax, respiratory depression, and potential failure of the block. Alternatives to nerve blocks discussed. All questions answered.  Platelets 173 INR 1.02)      Anesthesia Quick Evaluation

## 2015-11-27 NOTE — Progress Notes (Signed)
PCP is Alysia Penna  Nephrologist is Erling Cruz.  LOV was six months ago  Psychiatrist is Dr. Norma Fredrickson. Patient denied having any homicidal or suicidal ideation.  Patient denied having any acute cardiac or pulmonary issues  Nurse called Ebony Hail, [place] and informed her that patient was currently taking Parnate. Ebony Hail stated she would discuss this with a Anesthesiologist and see if patient needed to stop or continue taking Parnate.

## 2015-11-28 DIAGNOSIS — M19072 Primary osteoarthritis, left ankle and foot: Secondary | ICD-10-CM | POA: Diagnosis not present

## 2015-11-28 DIAGNOSIS — M19071 Primary osteoarthritis, right ankle and foot: Secondary | ICD-10-CM | POA: Diagnosis not present

## 2015-11-28 DIAGNOSIS — M25572 Pain in left ankle and joints of left foot: Secondary | ICD-10-CM | POA: Diagnosis not present

## 2015-11-28 DIAGNOSIS — M25571 Pain in right ankle and joints of right foot: Secondary | ICD-10-CM | POA: Diagnosis not present

## 2015-12-03 DIAGNOSIS — M19072 Primary osteoarthritis, left ankle and foot: Secondary | ICD-10-CM | POA: Diagnosis not present

## 2015-12-03 DIAGNOSIS — M25571 Pain in right ankle and joints of right foot: Secondary | ICD-10-CM | POA: Diagnosis not present

## 2015-12-03 DIAGNOSIS — M25572 Pain in left ankle and joints of left foot: Secondary | ICD-10-CM | POA: Diagnosis not present

## 2015-12-03 DIAGNOSIS — M19071 Primary osteoarthritis, right ankle and foot: Secondary | ICD-10-CM | POA: Diagnosis not present

## 2015-12-08 ENCOUNTER — Encounter (HOSPITAL_COMMUNITY)
Admission: RE | Disposition: A | Discharge status: Skilled Nursing Facility | Payer: Self-pay | Source: Ambulatory Visit | Attending: Orthopedic Surgery

## 2015-12-08 ENCOUNTER — Encounter (HOSPITAL_COMMUNITY): Payer: Self-pay | Admitting: *Deleted

## 2015-12-08 ENCOUNTER — Inpatient Hospital Stay (HOSPITAL_COMMUNITY): Payer: Medicare Other | Admitting: Vascular Surgery

## 2015-12-08 ENCOUNTER — Inpatient Hospital Stay (HOSPITAL_COMMUNITY)
Admission: RE | Admit: 2015-12-08 | Discharge: 2015-12-11 | DRG: 470 | Disposition: A | Discharge status: Skilled Nursing Facility | Payer: Medicare Other | Source: Ambulatory Visit | Attending: Orthopedic Surgery | Admitting: Orthopedic Surgery

## 2015-12-08 ENCOUNTER — Inpatient Hospital Stay (HOSPITAL_COMMUNITY): Payer: Medicare Other | Admitting: Anesthesiology

## 2015-12-08 DIAGNOSIS — I872 Venous insufficiency (chronic) (peripheral): Secondary | ICD-10-CM | POA: Diagnosis not present

## 2015-12-08 DIAGNOSIS — E875 Hyperkalemia: Secondary | ICD-10-CM | POA: Diagnosis not present

## 2015-12-08 DIAGNOSIS — M25561 Pain in right knee: Secondary | ICD-10-CM

## 2015-12-08 DIAGNOSIS — Z96651 Presence of right artificial knee joint: Secondary | ICD-10-CM | POA: Diagnosis not present

## 2015-12-08 DIAGNOSIS — M25569 Pain in unspecified knee: Secondary | ICD-10-CM | POA: Diagnosis not present

## 2015-12-08 DIAGNOSIS — Z96611 Presence of right artificial shoulder joint: Secondary | ICD-10-CM | POA: Diagnosis present

## 2015-12-08 DIAGNOSIS — G4733 Obstructive sleep apnea (adult) (pediatric): Secondary | ICD-10-CM | POA: Diagnosis not present

## 2015-12-08 DIAGNOSIS — G8918 Other acute postprocedural pain: Secondary | ICD-10-CM | POA: Diagnosis not present

## 2015-12-08 DIAGNOSIS — M179 Osteoarthritis of knee, unspecified: Secondary | ICD-10-CM | POA: Diagnosis not present

## 2015-12-08 DIAGNOSIS — R269 Unspecified abnormalities of gait and mobility: Secondary | ICD-10-CM

## 2015-12-08 DIAGNOSIS — M6281 Muscle weakness (generalized): Secondary | ICD-10-CM | POA: Diagnosis not present

## 2015-12-08 DIAGNOSIS — M810 Age-related osteoporosis without current pathological fracture: Secondary | ICD-10-CM | POA: Diagnosis present

## 2015-12-08 DIAGNOSIS — I10 Essential (primary) hypertension: Secondary | ICD-10-CM | POA: Diagnosis not present

## 2015-12-08 DIAGNOSIS — M1711 Unilateral primary osteoarthritis, right knee: Secondary | ICD-10-CM | POA: Diagnosis not present

## 2015-12-08 DIAGNOSIS — R2689 Other abnormalities of gait and mobility: Secondary | ICD-10-CM | POA: Diagnosis not present

## 2015-12-08 DIAGNOSIS — Z471 Aftercare following joint replacement surgery: Secondary | ICD-10-CM | POA: Diagnosis not present

## 2015-12-08 DIAGNOSIS — Z411 Encounter for cosmetic surgery: Secondary | ICD-10-CM | POA: Diagnosis not present

## 2015-12-08 DIAGNOSIS — R531 Weakness: Secondary | ICD-10-CM | POA: Diagnosis not present

## 2015-12-08 DIAGNOSIS — E039 Hypothyroidism, unspecified: Secondary | ICD-10-CM | POA: Diagnosis not present

## 2015-12-08 DIAGNOSIS — N183 Chronic kidney disease, stage 3 (moderate): Secondary | ICD-10-CM | POA: Diagnosis not present

## 2015-12-08 HISTORY — PX: TOTAL KNEE ARTHROPLASTY: SHX125

## 2015-12-08 SURGERY — ARTHROPLASTY, KNEE, TOTAL
Anesthesia: Regional | Laterality: Right

## 2015-12-08 MED ORDER — METHOCARBAMOL 1000 MG/10ML IJ SOLN
500.0000 mg | Freq: Four times a day (QID) | INTRAMUSCULAR | Status: DC | PRN
  Administered 2015-12-08: 500 mg via INTRAVENOUS
  Filled 2015-12-08 (×2): qty 5

## 2015-12-08 MED ORDER — ZOLPIDEM TARTRATE 5 MG PO TABS: 5.0000 mg | ORAL_TABLET | Freq: Every evening | ORAL | Status: DC | PRN

## 2015-12-08 MED ORDER — SODIUM CHLORIDE 0.9 % IV SOLN
1000.0000 mg | INTRAVENOUS | Status: AC
  Administered 2015-12-08: 1000 mg via INTRAVENOUS
  Filled 2015-12-08: qty 10

## 2015-12-08 MED ORDER — PROPOFOL 10 MG/ML IV BOLUS
INTRAVENOUS | Status: DC | PRN
  Administered 2015-12-08: 100 mg via INTRAVENOUS
  Administered 2015-12-08: 50 mg via INTRAVENOUS

## 2015-12-08 MED ORDER — FENTANYL CITRATE (PF) 100 MCG/2ML IJ SOLN
INTRAMUSCULAR | Status: DC | PRN
  Administered 2015-12-08 (×2): 50 ug via INTRAVENOUS
  Administered 2015-12-08: 100 ug via INTRAVENOUS
  Administered 2015-12-08 (×2): 50 ug via INTRAVENOUS

## 2015-12-08 MED ORDER — ONDANSETRON HCL 4 MG/2ML IJ SOLN: 4.0000 mg | Freq: Four times a day (QID) | INTRAMUSCULAR | Status: DC | PRN

## 2015-12-08 MED ORDER — FENTANYL CITRATE (PF) 100 MCG/2ML IJ SOLN
INTRAMUSCULAR | Status: AC
  Filled 2015-12-08: qty 2

## 2015-12-08 MED ORDER — FENTANYL CITRATE (PF) 100 MCG/2ML IJ SOLN
25.0000 ug | INTRAMUSCULAR | Status: DC | PRN
  Administered 2015-12-08: 50 ug via INTRAVENOUS
  Administered 2015-12-08 (×2): 25 ug via INTRAVENOUS
  Administered 2015-12-08: 50 ug via INTRAVENOUS

## 2015-12-08 MED ORDER — CEFAZOLIN SODIUM-DEXTROSE 2-4 GM/100ML-% IV SOLN
2.0000 g | INTRAVENOUS | Status: AC
  Administered 2015-12-08: 2 g via INTRAVENOUS

## 2015-12-08 MED ORDER — GABAPENTIN 300 MG PO CAPS
300.0000 mg | ORAL_CAPSULE | Freq: Two times a day (BID) | ORAL | Status: DC
  Administered 2015-12-08 – 2015-12-11 (×6): 300 mg via ORAL
  Filled 2015-12-08 (×6): qty 1

## 2015-12-08 MED ORDER — NEOSTIGMINE METHYLSULFATE 5 MG/5ML IV SOSY
PREFILLED_SYRINGE | INTRAVENOUS | Status: AC
  Filled 2015-12-08: qty 5

## 2015-12-08 MED ORDER — ARIPIPRAZOLE 5 MG PO TABS
2.5000 mg | ORAL_TABLET | Freq: Every day | ORAL | Status: DC
Start: 2015-12-09 — End: 2015-12-11
  Administered 2015-12-09 – 2015-12-11 (×3): 2.5 mg via ORAL
  Filled 2015-12-08 (×3): qty 1

## 2015-12-08 MED ORDER — TRANYLCYPROMINE SULFATE 10 MG PO TABS
30.0000 mg | ORAL_TABLET | Freq: Two times a day (BID) | ORAL | Status: DC
  Administered 2015-12-09 – 2015-12-11 (×6): 30 mg via ORAL
  Filled 2015-12-08 (×8): qty 3

## 2015-12-08 MED ORDER — CEFAZOLIN SODIUM-DEXTROSE 2-4 GM/100ML-% IV SOLN
INTRAVENOUS | Status: AC
  Filled 2015-12-08: qty 100

## 2015-12-08 MED ORDER — PHENYLEPHRINE 40 MCG/ML (10ML) SYRINGE FOR IV PUSH (FOR BLOOD PRESSURE SUPPORT)
PREFILLED_SYRINGE | INTRAVENOUS | Status: AC
  Filled 2015-12-08: qty 10

## 2015-12-08 MED ORDER — CELECOXIB 200 MG PO CAPS
200.0000 mg | ORAL_CAPSULE | Freq: Two times a day (BID) | ORAL | Status: DC
Start: 2015-12-08 — End: 2015-12-09
  Administered 2015-12-08: 200 mg via ORAL
  Filled 2015-12-08 (×2): qty 1

## 2015-12-08 MED ORDER — CHLORHEXIDINE GLUCONATE 4 % EX LIQD: 60.0000 mL | Freq: Once | CUTANEOUS | Status: DC

## 2015-12-08 MED ORDER — LACTATED RINGERS IV SOLN: INTRAVENOUS | Status: DC

## 2015-12-08 MED ORDER — ACETAMINOPHEN 325 MG PO TABS
650.0000 mg | ORAL_TABLET | Freq: Four times a day (QID) | ORAL | Status: DC | PRN
  Administered 2015-12-10 – 2015-12-11 (×2): 650 mg via ORAL
  Filled 2015-12-08 (×2): qty 2

## 2015-12-08 MED ORDER — DOCUSATE SODIUM 100 MG PO CAPS
100.0000 mg | ORAL_CAPSULE | Freq: Two times a day (BID) | ORAL | Status: DC
  Administered 2015-12-08 – 2015-12-11 (×6): 100 mg via ORAL
  Filled 2015-12-08 (×6): qty 1

## 2015-12-08 MED ORDER — PROPOFOL 500 MG/50ML IV EMUL: INTRAVENOUS | Status: DC | PRN

## 2015-12-08 MED ORDER — METHOCARBAMOL 500 MG PO TABS
500.0000 mg | ORAL_TABLET | Freq: Four times a day (QID) | ORAL | Status: DC | PRN
  Filled 2015-12-08: qty 1

## 2015-12-08 MED ORDER — HYDROMORPHONE HCL 1 MG/ML IJ SOLN
0.5000 mg | INTRAMUSCULAR | Status: DC | PRN
  Administered 2015-12-08: 1 mg via INTRAVENOUS
  Filled 2015-12-08: qty 1

## 2015-12-08 MED ORDER — ONDANSETRON HCL 4 MG PO TABS: 4.0000 mg | ORAL_TABLET | Freq: Four times a day (QID) | ORAL | Status: DC | PRN

## 2015-12-08 MED ORDER — CEFAZOLIN SODIUM-DEXTROSE 2-4 GM/100ML-% IV SOLN
2.0000 g | Freq: Four times a day (QID) | INTRAVENOUS | Status: AC
  Administered 2015-12-08 – 2015-12-09 (×2): 2 g via INTRAVENOUS
  Filled 2015-12-08 (×2): qty 100

## 2015-12-08 MED ORDER — ASPIRIN EC 325 MG PO TBEC: 325.0000 mg | DELAYED_RELEASE_TABLET | Freq: Two times a day (BID) | ORAL | 0 refills | Status: DC

## 2015-12-08 MED ORDER — BISACODYL 5 MG PO TBEC
5.0000 mg | DELAYED_RELEASE_TABLET | Freq: Every day | ORAL | Status: DC | PRN
  Administered 2015-12-11: 5 mg via ORAL
  Filled 2015-12-08: qty 1

## 2015-12-08 MED ORDER — MAGNESIUM CITRATE PO SOLN: 1.0000 | Freq: Once | ORAL | Status: DC | PRN

## 2015-12-08 MED ORDER — ROCURONIUM BROMIDE 10 MG/ML (PF) SYRINGE
PREFILLED_SYRINGE | INTRAVENOUS | Status: AC
  Filled 2015-12-08: qty 20

## 2015-12-08 MED ORDER — BUPIVACAINE-EPINEPHRINE (PF) 0.5% -1:200000 IJ SOLN
INTRAMUSCULAR | Status: DC | PRN
  Administered 2015-12-08: 30 mL via PERINEURAL

## 2015-12-08 MED ORDER — TIZANIDINE HCL 2 MG PO TABS: 2.0000 mg | ORAL_TABLET | Freq: Three times a day (TID) | ORAL | 0 refills | Status: DC | PRN

## 2015-12-08 MED ORDER — ONDANSETRON HCL 4 MG/2ML IJ SOLN: 4.0000 mg | Freq: Once | INTRAMUSCULAR | Status: DC | PRN

## 2015-12-08 MED ORDER — BUPIVACAINE HCL (PF) 0.5 % IJ SOLN
INTRAMUSCULAR | Status: DC | PRN
  Administered 2015-12-08: 10 mL

## 2015-12-08 MED ORDER — FENTANYL CITRATE (PF) 100 MCG/2ML IJ SOLN
INTRAMUSCULAR | Status: AC
  Administered 2015-12-08: 25 ug
  Filled 2015-12-08: qty 2

## 2015-12-08 MED ORDER — TRANEXAMIC ACID 1000 MG/10ML IV SOLN
1000.0000 mg | Freq: Once | INTRAVENOUS | Status: AC
  Administered 2015-12-08: 1000 mg via INTRAVENOUS
  Filled 2015-12-08: qty 10

## 2015-12-08 MED ORDER — NEOSTIGMINE METHYLSULFATE 10 MG/10ML IV SOLN
INTRAVENOUS | Status: DC | PRN
  Administered 2015-12-08: 4 mg via INTRAVENOUS

## 2015-12-08 MED ORDER — DEXAMETHASONE SODIUM PHOSPHATE 10 MG/ML IJ SOLN
10.0000 mg | Freq: Two times a day (BID) | INTRAMUSCULAR | Status: AC
  Administered 2015-12-08 – 2015-12-10 (×4): 10 mg via INTRAVENOUS
  Filled 2015-12-08 (×4): qty 1

## 2015-12-08 MED ORDER — GLYCOPYRROLATE 0.2 MG/ML IV SOSY
PREFILLED_SYRINGE | INTRAVENOUS | Status: AC
  Filled 2015-12-08: qty 3

## 2015-12-08 MED ORDER — OXYCODONE-ACETAMINOPHEN 5-325 MG PO TABS: 1.0000 | ORAL_TABLET | ORAL | 0 refills | Status: DC | PRN

## 2015-12-08 MED ORDER — GLYCOPYRROLATE 0.2 MG/ML IJ SOLN
INTRAMUSCULAR | Status: DC | PRN
  Administered 2015-12-08: .6 mg via INTRAVENOUS

## 2015-12-08 MED ORDER — PHENYLEPHRINE HCL 10 MG/ML IJ SOLN
INTRAMUSCULAR | Status: DC | PRN
  Administered 2015-12-08 (×4): 120 ug via INTRAVENOUS

## 2015-12-08 MED ORDER — ONDANSETRON HCL 4 MG/2ML IJ SOLN
INTRAMUSCULAR | Status: AC
  Filled 2015-12-08: qty 2

## 2015-12-08 MED ORDER — ONDANSETRON HCL 4 MG/2ML IJ SOLN
INTRAMUSCULAR | Status: DC | PRN
  Administered 2015-12-08: 4 mg via INTRAVENOUS

## 2015-12-08 MED ORDER — OXYCODONE HCL 5 MG PO TABS
ORAL_TABLET | ORAL | Status: AC
  Filled 2015-12-08: qty 2

## 2015-12-08 MED ORDER — BUPIVACAINE-EPINEPHRINE (PF) 0.5% -1:200000 IJ SOLN
INTRAMUSCULAR | Status: AC
  Filled 2015-12-08: qty 30

## 2015-12-08 MED ORDER — BUPIVACAINE LIPOSOME 1.3 % IJ SUSP
20.0000 mL | INTRAMUSCULAR | Status: AC
  Administered 2015-12-08: 20 mL
  Filled 2015-12-08: qty 20

## 2015-12-08 MED ORDER — PHENYLEPHRINE 40 MCG/ML (10ML) SYRINGE FOR IV PUSH (FOR BLOOD PRESSURE SUPPORT)
PREFILLED_SYRINGE | INTRAVENOUS | Status: AC
  Filled 2015-12-08: qty 30

## 2015-12-08 MED ORDER — ROCURONIUM 10MG/ML (10ML) SYRINGE FOR MEDFUSION PUMP - OPTIME
INTRAVENOUS | Status: DC | PRN
  Administered 2015-12-08: 50 mg via INTRAVENOUS

## 2015-12-08 MED ORDER — SODIUM CHLORIDE 0.9 % IV SOLN
INTRAVENOUS | Status: DC
Start: 2015-12-08 — End: 2015-12-11
  Administered 2015-12-08 – 2015-12-10 (×2): via INTRAVENOUS

## 2015-12-08 MED ORDER — TRANEXAMIC ACID 1000 MG/10ML IV SOLN
2000.0000 mg | INTRAVENOUS | Status: DC
  Filled 2015-12-08: qty 20

## 2015-12-08 MED ORDER — 0.9 % SODIUM CHLORIDE (POUR BTL) OPTIME
TOPICAL | Status: DC | PRN
  Administered 2015-12-08: 3000 mL

## 2015-12-08 MED ORDER — POLYETHYLENE GLYCOL 3350 17 G PO PACK: 17.0000 g | PACK | Freq: Every day | ORAL | Status: DC | PRN

## 2015-12-08 MED ORDER — DIPHENHYDRAMINE HCL 12.5 MG/5ML PO ELIX: 12.5000 mg | ORAL_SOLUTION | ORAL | Status: DC | PRN

## 2015-12-08 MED ORDER — ASPIRIN EC 325 MG PO TBEC
325.0000 mg | DELAYED_RELEASE_TABLET | Freq: Two times a day (BID) | ORAL | Status: DC
  Administered 2015-12-09 – 2015-12-11 (×5): 325 mg via ORAL
  Filled 2015-12-08 (×4): qty 1

## 2015-12-08 MED ORDER — SODIUM CHLORIDE 0.9 % IV SOLN
INTRAVENOUS | Status: DC
  Administered 2015-12-08 (×2): via INTRAVENOUS

## 2015-12-08 MED ORDER — OXYCODONE HCL 5 MG PO TABS
5.0000 mg | ORAL_TABLET | ORAL | Status: DC | PRN
  Administered 2015-12-08: 10 mg via ORAL
  Administered 2015-12-09 (×2): 5 mg via ORAL
  Filled 2015-12-08 (×2): qty 1

## 2015-12-08 MED ORDER — L-METHYLFOLATE FORTE 15-90.314 MG PO CAPS: 1.0000 | ORAL_CAPSULE | Freq: Every day | ORAL | Status: DC

## 2015-12-08 MED ORDER — ACETAMINOPHEN 650 MG RE SUPP: 650.0000 mg | Freq: Four times a day (QID) | RECTAL | Status: DC | PRN

## 2015-12-08 MED ORDER — ALUM & MAG HYDROXIDE-SIMETH 200-200-20 MG/5ML PO SUSP: 30.0000 mL | ORAL | Status: DC | PRN

## 2015-12-08 SURGICAL SUPPLY — 63 items
APL SKNCLS STERI-STRIP NONHPOA (GAUZE/BANDAGES/DRESSINGS) ×1
BANDAGE ESMARK 6X9 LF (GAUZE/BANDAGES/DRESSINGS) ×1 IMPLANT
BENZOIN TINCTURE PRP APPL 2/3 (GAUZE/BANDAGES/DRESSINGS) ×2 IMPLANT
BLADE SAGITTAL 25.0X1.19X90 (BLADE) ×2 IMPLANT
BLADE SAW SAG 90X13X1.27 (BLADE) ×2 IMPLANT
BNDG CMPR 9X6 STRL LF SNTH (GAUZE/BANDAGES/DRESSINGS) ×1
BNDG ESMARK 6X9 LF (GAUZE/BANDAGES/DRESSINGS) ×2
BOWL SMART MIX CTS (DISPOSABLE) ×2 IMPLANT
CAPT KNEE TOTAL 3 ATTUNE ×1 IMPLANT
CEMENT HV SMART SET (Cement) ×4 IMPLANT
COVER SURGICAL LIGHT HANDLE (MISCELLANEOUS) ×2 IMPLANT
CUFF TOURNIQUET SINGLE 34IN LL (TOURNIQUET CUFF) ×2 IMPLANT
CUFF TOURNIQUET SINGLE 44IN (TOURNIQUET CUFF) IMPLANT
DRAPE EXTREMITY T 121X128X90 (DRAPE) ×2 IMPLANT
DRAPE IMP U-DRAPE 54X76 (DRAPES) ×2 IMPLANT
DRAPE U-SHAPE 47X51 STRL (DRAPES) ×2 IMPLANT
DRSG AQUACEL AG ADV 3.5X10 (GAUZE/BANDAGES/DRESSINGS) IMPLANT
DRSG MEPILEX BORDER 4X12 (GAUZE/BANDAGES/DRESSINGS) ×1 IMPLANT
DRSG PAD ABDOMINAL 8X10 ST (GAUZE/BANDAGES/DRESSINGS) ×2 IMPLANT
DURAPREP 26ML APPLICATOR (WOUND CARE) ×2 IMPLANT
ELECT REM PT RETURN 9FT ADLT (ELECTROSURGICAL) ×2
ELECTRODE REM PT RTRN 9FT ADLT (ELECTROSURGICAL) ×1 IMPLANT
EVACUATOR 1/8 PVC DRAIN (DRAIN) ×1 IMPLANT
FACESHIELD WRAPAROUND (MASK) ×2 IMPLANT
FACESHIELD WRAPAROUND OR TEAM (MASK) ×1 IMPLANT
GAUZE SPONGE 4X4 12PLY STRL (GAUZE/BANDAGES/DRESSINGS) ×2 IMPLANT
GLOVE BIOGEL PI IND STRL 8 (GLOVE) ×2 IMPLANT
GLOVE BIOGEL PI INDICATOR 8 (GLOVE) ×2
GLOVE ECLIPSE 7.5 STRL STRAW (GLOVE) ×4 IMPLANT
GOWN STRL REUS W/ TWL LRG LVL3 (GOWN DISPOSABLE) ×1 IMPLANT
GOWN STRL REUS W/ TWL XL LVL3 (GOWN DISPOSABLE) ×2 IMPLANT
GOWN STRL REUS W/TWL LRG LVL3 (GOWN DISPOSABLE) ×2
GOWN STRL REUS W/TWL XL LVL3 (GOWN DISPOSABLE) ×4
HANDPIECE INTERPULSE COAX TIP (DISPOSABLE) ×2
HOOD PEEL AWAY FACE SHEILD DIS (HOOD) ×6 IMPLANT
IMMOBILIZER KNEE 20 (SOFTGOODS) IMPLANT
IMMOBILIZER KNEE 22 UNIV (SOFTGOODS) ×2 IMPLANT
KIT BASIN OR (CUSTOM PROCEDURE TRAY) ×2 IMPLANT
KIT ROOM TURNOVER OR (KITS) ×2 IMPLANT
MANIFOLD NEPTUNE II (INSTRUMENTS) ×2 IMPLANT
NDL SPNL 22GX3.5 QUINCKE BK (NEEDLE) ×1 IMPLANT
NEEDLE SPNL 22GX3.5 QUINCKE BK (NEEDLE) ×2 IMPLANT
NS IRRIG 1000ML POUR BTL (IV SOLUTION) ×2 IMPLANT
PACK TOTAL JOINT (CUSTOM PROCEDURE TRAY) ×2 IMPLANT
PACK UNIVERSAL I (CUSTOM PROCEDURE TRAY) ×2 IMPLANT
PAD ARMBOARD 7.5X6 YLW CONV (MISCELLANEOUS) ×4 IMPLANT
PAD CAST 4YDX4 CTTN HI CHSV (CAST SUPPLIES) ×1 IMPLANT
PADDING CAST COTTON 4X4 STRL (CAST SUPPLIES) ×2
SET HNDPC FAN SPRY TIP SCT (DISPOSABLE) ×1 IMPLANT
STAPLER VISISTAT 35W (STAPLE) IMPLANT
STRIP CLOSURE SKIN 1/2X4 (GAUZE/BANDAGES/DRESSINGS) ×3 IMPLANT
SUCTION FRAZIER HANDLE 10FR (MISCELLANEOUS) ×1
SUCTION TUBE FRAZIER 10FR DISP (MISCELLANEOUS) ×1 IMPLANT
SUT MNCRL AB 3-0 PS2 18 (SUTURE) IMPLANT
SUT VIC AB 0 CTB1 27 (SUTURE) ×4 IMPLANT
SUT VIC AB 1 CT1 27 (SUTURE) ×4
SUT VIC AB 1 CT1 27XBRD ANBCTR (SUTURE) ×2 IMPLANT
SUT VIC AB 2-0 CTB1 (SUTURE) ×4 IMPLANT
SYR 50ML LL SCALE MARK (SYRINGE) ×2 IMPLANT
TOWEL OR 17X24 6PK STRL BLUE (TOWEL DISPOSABLE) ×2 IMPLANT
TOWEL OR 17X26 10 PK STRL BLUE (TOWEL DISPOSABLE) ×2 IMPLANT
TRAY FOLEY CATH 16FRSI W/METER (SET/KITS/TRAYS/PACK) IMPLANT
WRAP KNEE MAXI GEL POST OP (GAUZE/BANDAGES/DRESSINGS) ×1 IMPLANT

## 2015-12-08 NOTE — Anesthesia Procedure Notes (Signed)
Procedure Name: Intubation Date/Time: 12/08/2015 2:20 PM Performed by: Salli Quarry Kimila Papaleo Pre-anesthesia Checklist: Patient identified, Emergency Drugs available, Suction available and Patient being monitored Patient Re-evaluated:Patient Re-evaluated prior to inductionOxygen Delivery Method: Circle System Utilized Preoxygenation: Pre-oxygenation with 100% oxygen Intubation Type: IV induction Ventilation: Mask ventilation without difficulty Laryngoscope Size: Mac and 3 Grade View: Grade II Tube type: Oral Tube size: 7.0 mm Number of attempts: 1 Airway Equipment and Method: Stylet Placement Confirmation: ETT inserted through vocal cords under direct vision,  positive ETCO2 and breath sounds checked- equal and bilateral Secured at: 23 cm Tube secured with: Tape Dental Injury: Teeth and Oropharynx as per pre-operative assessment

## 2015-12-08 NOTE — H&P (Signed)
TOTAL KNEE ADMISSION H&P  Patient is being admitted for right total knee arthroplasty.  Subjective:  Chief Complaint:right knee pain.  HPI: [name], 75 y.o. male, has a history of pain and functional disability in the right knee due to arthritis and has failed non-surgical conservative treatments for greater than 12 weeks to includeNSAID's and/or analgesics, corticosteriod injections, viscosupplementation injections, flexibility and strengthening excercises, weight reduction as appropriate and activity modification.  Onset of symptoms was gradual, starting 5 years ago with gradually worsening course since that time. The patient noted prior procedures on the knee to include  arthroscopy and menisectomy on the right knee(s).  Patient currently rates pain in the right knee(s) at 9 out of 10 with activity. Patient has night pain, worsening of pain with activity and weight bearing, pain that interferes with activities of daily living, pain with passive range of motion, crepitus and joint swelling.  Patient has evidence of subchondral cysts, subchondral sclerosis and joint space narrowing by imaging studies. This patient has had Failure of all reasonable conservative care. There is no active infection.  Patient Active Problem List   Diagnosis Date Noted  . Pain in joint, ankle and foot 10/07/2015  . Shingles 05/27/2015  . Diverticulitis of colon 05/27/2015  . Fatty liver 10/09/2014  . Gallstones 10/09/2014  . Bilateral lower extremity edema 09/25/2014  . Weight gain 09/25/2014  . Weakness of both lower extremities 06/05/2014  . Chronic venous insufficiency 08/29/2013  . Right shoulder pain 08/29/2013  . Proximal humerus fracture 05/22/2013  . Neuropathy ([place]) 01/20/2011  . Hypothyroidism 10/03/2009  . C V A/STROKE 02/11/2009  . KNEE PAIN 01/02/2009  . CHRONIC VENOUS HYPERTENSION WITH INFLAMMATION 10/30/2008  . Obstructive sleep apnea 10/30/2008  . Osteoporosis 09/25/2008  . INTERNAL  THROMBOSED HEMORRHOIDS 07/05/2008  . OTHER SPECIFIED SITES OF SPRAINS AND STRAINS 07/05/2008  . Chronic kidney disease (CKD), stage III (moderate) 03/01/2008  . IRON DEFICIENCY ANEMIA SECONDARY TO BLOOD LOSS 04/11/2007  . Essential hypertension 04/11/2007  . ADVEF, DRUG/MEDICINAL/BIOLOGICAL SUBST NOS 01/06/2007  . Mild depressed bipolar I disorder ([place]) 09/21/2006  . DIVERTICULOSIS, COLON 09/21/2006  . IRRITABLE BOWEL SYNDROME 09/21/2006   Past Medical History:  Diagnosis Date  . Arthritis    back  . Back pain    reason unknown  . Benign prostatic hyperplasia (BPH) with urinary urge incontinence    sees Dr. Jeffie Pollock   . Bipolar 1 disorder [facility])    sees Dr. Norma Fredrickson  . CKD (chronic kidney disease)    (Dr. Erling Cruz)  . Depression    takes Parnate daily  . Headache(784.0)    occasionally  . History of colon polyps   . Joint pain   . Joint swelling   . Osteoporosis    takes Drisdol weekly and Caltrate daily  . Peripheral edema    takes Lasix daily   . Sleep apnea    wears c-pap  . Swelling    right hand    Past Surgical History:  Procedure Laterality Date  . COLONOSCOPY W/ POLYPECTOMY  03-10-15   per Dr. Havery Moros, adenomatous polyps, repeat in 3 yrs   . KNEE SURGERY Right   . REVERSE SHOULDER ARTHROPLASTY Right 05/22/2013   Procedure: REVERSE SHOULDER ARTHROPLASTY;  Surgeon: Nita Sells, MD;  Location: [facility];  Service: Orthopedics;  Laterality: Right;  Right reverse total shoulder  . TONSILLECTOMY      No prescriptions prior to admission.   Allergies  Allergen Reactions  . No Known Allergies  Social History  Substance Use Topics  . Smoking status: Never Smoker  . Smokeless tobacco: Never Used  . Alcohol use No    Family History  Problem Relation Age of Onset  . Cancer Mother   . Stroke Father   . Colon cancer Neg Hx   . Esophageal cancer Neg Hx   . Rectal cancer Neg Hx   . Stomach cancer Neg Hx      ROS  ROS: I have reviewed the  patient's review of systems thoroughly and there are no positive responses as relates to the HPI. Objective:  Physical Exam  Vital signs in last 24 hours:   Well-developed well-nourished patient in no acute distress. Alert and oriented x3 HEENT:within normal limits Cardiac: Regular rate and rhythm Pulmonary: Lungs clear to auscultation Abdomen: Soft and nontender.  Normal active bowel sounds  Musculoskeletal: Right knee: Painful range of motion. Limited range of motion. No instability. Sniffing medial joint line tenderness. Neurovascularly intact distally. Labs:  Recent Results (from the past 2160 hour(s))  Basic metabolic panel     Status: Abnormal   Collection Time: 10/07/15  2:57 PM  Result Value Ref Range   Sodium 138 135 - 145 mEq/L   Potassium 4.6 3.5 - 5.1 mEq/L   Chloride 104 96 - 112 mEq/L   CO2 29 19 - 32 mEq/L   Glucose, Bld 91 70 - 99 mg/dL   BUN 34 (H) 6 - 23 mg/dL   Creatinine, Ser 2.08 (H) 0.40 - 1.50 mg/dL   Calcium 9.4 8.4 - 10.5 mg/dL   GFR 33.20 (L) >60.00 mL/min  APTT     Status: Abnormal   Collection Time: 11/27/15  3:04 PM  Result Value Ref Range   aPTT 39 (H) 24 - 36 seconds    Comment:        IF BASELINE aPTT IS ELEVATED, SUGGEST PATIENT RISK ASSESSMENT BE USED TO DETERMINE APPROPRIATE ANTICOAGULANT THERAPY.   CBC WITH DIFFERENTIAL     Status: Abnormal   Collection Time: 11/27/15  3:04 PM  Result Value Ref Range   WBC 6.2 4.0 - 10.5 K/uL   RBC 4.06 (L) 4.22 - 5.81 MIL/uL   Hemoglobin 12.8 (L) 13.0 - 17.0 g/dL   HCT 39.5 39.0 - 52.0 %   MCV 97.3 78.0 - 100.0 fL   MCH 31.5 26.0 - 34.0 pg   MCHC 32.4 30.0 - 36.0 g/dL   RDW 12.9 11.5 - 15.5 %   Platelets 173 150 - 400 K/uL   Neutrophils Relative % 65 %   Neutro Abs 4.0 1.7 - 7.7 K/uL   Lymphocytes Relative 23 %   Lymphs Abs 1.4 0.7 - 4.0 K/uL   Monocytes Relative 7 %   Monocytes Absolute 0.5 0.1 - 1.0 K/uL   Eosinophils Relative 5 %   Eosinophils Absolute 0.3 0.0 - 0.7 K/uL   Basophils  Relative 0 %   Basophils Absolute 0.0 0.0 - 0.1 K/uL  Comprehensive metabolic panel     Status: Abnormal   Collection Time: 11/27/15  3:04 PM  Result Value Ref Range   Sodium 142 135 - 145 mmol/L   Potassium 4.5 3.5 - 5.1 mmol/L   Chloride 108 101 - 111 mmol/L   CO2 27 22 - 32 mmol/L   Glucose, Bld 120 (H) 65 - 99 mg/dL   BUN 29 (H) 6 - 20 mg/dL   Creatinine, Ser 2.19 (H) 0.61 - 1.24 mg/dL   Calcium 9.7 8.9 - 10.3 mg/dL  Total Protein 6.1 (L) 6.5 - 8.1 g/dL   Albumin 3.8 3.5 - 5.0 g/dL   AST 29 15 - 41 U/L   ALT 26 17 - 63 U/L   Alkaline Phosphatase 77 38 - 126 U/L   Total Bilirubin 0.7 0.3 - 1.2 mg/dL   GFR calc non Af Amer 28 (L) >60 mL/min   GFR calc Af Amer 32 (L) >60 mL/min    Comment: (NOTE) The eGFR has been calculated using the CKD EPI equation. This calculation has not been validated in all clinical situations. eGFR's persistently <60 mL/min signify possible Chronic Kidney Disease.    Anion gap 7 5 - 15  Protime-INR     Status: None   Collection Time: 11/27/15  3:04 PM  Result Value Ref Range   Prothrombin Time 13.4 11.4 - 15.2 seconds   INR 1.02   Surgical pcr screen     Status: None   Collection Time: 11/27/15  3:05 PM  Result Value Ref Range   MRSA, PCR NEGATIVE NEGATIVE   Staphylococcus aureus NEGATIVE NEGATIVE    Comment:        The Xpert SA Assay (FDA approved for NASAL specimens in patients over 94 years of age), is one component of a comprehensive surveillance program.  Test performance has been validated by [facility] for patients greater than or equal to 13 year old. It is not intended to diagnose infection nor to guide or monitor treatment.   Urinalysis, Routine w reflex microscopic (not at [facility])     Status: None   Collection Time: 11/27/15  3:05 PM  Result Value Ref Range   Color, Urine YELLOW YELLOW   APPearance CLEAR CLEAR   Specific Gravity, Urine 1.016 1.005 - 1.030   pH 5.5 5.0 - 8.0   Glucose, UA NEGATIVE NEGATIVE mg/dL   Hgb  urine dipstick NEGATIVE NEGATIVE   Bilirubin Urine NEGATIVE NEGATIVE   Ketones, ur NEGATIVE NEGATIVE mg/dL   Protein, ur NEGATIVE NEGATIVE mg/dL   Nitrite NEGATIVE NEGATIVE   Leukocytes, UA NEGATIVE NEGATIVE    Comment: MICROSCOPIC NOT DONE ON URINES WITH NEGATIVE PROTEIN, BLOOD, LEUKOCYTES, NITRITE, OR GLUCOSE <1000 mg/dL.  Type and screen Order type and screen if day of surgery is less than 15 days from draw of preadmission visit or order morning of surgery if day of surgery is greater than 6 days from preadmission visit.     Status: None   Collection Time: 11/27/15  3:07 PM  Result Value Ref Range   ABO/RH(D) A POS    Antibody Screen NEG    Sample Expiration 12/11/2015    Extend sample reason NO TRANSFUSIONS OR PREGNANCY IN THE PAST 3 MONTHS    Estimated body mass index is 32.05 kg/m as calculated from the following:   Height as of 11/27/15: _0  (1.676 m).   Weight as of 11/27/15: 90.1 kg (198 lb 9.6 oz).   Imaging Review Plain radiographs demonstrate severe degenerative joint disease of the right knee(s). The overall alignment ismild varus. The bone quality appears to be fair for age and reported activity level.  Assessment/Plan:  End stage arthritis, right knee   The patient history, physical examination, clinical judgment of the provider and imaging studies are consistent with end stage degenerative joint disease of the right knee(s) and total knee arthroplasty is deemed medically necessary. The treatment options including medical management, injection therapy arthroscopy and arthroplasty were discussed at length. The risks and benefits of total knee arthroplasty were presented  and reviewed. The risks due to aseptic loosening, infection, stiffness, patella tracking problems, thromboembolic complications and other imponderables were discussed. The patient acknowledged the explanation, agreed to proceed with the plan and consent was signed. Patient is being admitted for inpatient  treatment for surgery, pain control, PT, OT, prophylactic antibiotics, VTE prophylaxis, progressive ambulation and ADL's and discharge planning. The patient is planning to be discharged home with home health services

## 2015-12-08 NOTE — Transfer of Care (Signed)
Immediate Anesthesia Transfer of Care Note  Patient: [name]  Procedure(s) Performed: Procedure(s): TOTAL KNEE ARTHROPLASTY (Right)  Patient Location: PACU  Anesthesia Type:GA combined with regional for post-op pain  Level of Consciousness: awake, patient cooperative and responds to stimulation  Airway & Oxygen Therapy: Patient Spontanous Breathing and Patient connected to nasal cannula oxygen  Post-op Assessment: Report given to RN and Post -op Vital signs reviewed and stable  Post vital signs: Reviewed and stable  Last Vitals:  Vitals:   12/08/15 1315 12/08/15 1320  BP:  (!) 131/52  Pulse: 79 82  Resp: 15 17  Temp:      Last Pain:  Vitals:   12/08/15 1131  TempSrc: Oral         Complications: No apparent anesthesia complications

## 2015-12-08 NOTE — Anesthesia Postprocedure Evaluation (Signed)
Anesthesia Post Note  Patient: [name]  Procedure(s) Performed: Procedure(s) (LRB): TOTAL KNEE ARTHROPLASTY (Right)  Patient location during evaluation: PACU Anesthesia Type: General Level of consciousness: sedated Pain management: pain level controlled Vital Signs Assessment: post-procedure vital signs reviewed and stable Respiratory status: spontaneous breathing and respiratory function stable Cardiovascular status: stable Anesthetic complications: no    Last Vitals:  Vitals:   12/08/15 1915 12/08/15 1936  BP: 130/72 (!) 155/64  Pulse:    Resp:    Temp:      Last Pain:  Vitals:   12/08/15 1936  TempSrc: Oral  PainSc:                  Jakel Alphin DANIEL

## 2015-12-08 NOTE — Discharge Instructions (Signed)

## 2015-12-08 NOTE — Anesthesia Procedure Notes (Signed)
Anesthesia Regional Block:  Adductor canal block  Pre-Anesthetic Checklist: ,, timeout performed, Correct Patient, Correct Site, Correct Laterality, Correct Procedure, Correct Position, site marked, Risks and benefits discussed,  Surgical consent,  Pre-op evaluation,  At surgeon's request and post-op pain management  Laterality: Right  Prep: chloraprep       Needles:  Injection technique: Single-shot  Needle Type: Echogenic Stimulator Needle     Needle Length: 9cm 9 cm Needle Gauge: 21 and 21 G    Additional Needles:  Procedures: ultrasound guided (picture in chart) Adductor canal block Narrative:  Start time: 12/08/2015 12:52 PM End time: 12/08/2015 12:54 PM Injection made incrementally with aspirations every 5 mL.  Performed by: Personally  Anesthesiologist: Nilda Simmer

## 2015-12-08 NOTE — Progress Notes (Signed)
Orthopedic Tech Progress Note Patient Details:  [name] [birth year] [id number]  CPM Right Knee CPM Right Knee: On Right Knee Flexion (Degrees): 90 Right Knee Extension (Degrees): 0   [name] 12/08/2015, 5:42 PM

## 2015-12-09 ENCOUNTER — Encounter (HOSPITAL_COMMUNITY): Payer: Self-pay | Admitting: Orthopedic Surgery

## 2015-12-09 LAB — BASIC METABOLIC PANEL
ANION GAP: 7 (ref 5–15)
BUN: 32 mg/dL — ABNORMAL HIGH (ref 6–20)
CHLORIDE: 109 mmol/L (ref 101–111)
CO2: 25 mmol/L (ref 22–32)
CREATININE: 2 mg/dL — AB (ref 0.61–1.24)
Calcium: 9 mg/dL (ref 8.9–10.3)
GFR calc non Af Amer: 31 mL/min — ABNORMAL LOW (ref >60)
GFR, EST AFRICAN AMERICAN: 36 mL/min — AB (ref >60)
Glucose, Bld: 147 mg/dL — ABNORMAL HIGH (ref 65–99)
POTASSIUM: 5.4 mmol/L — AB (ref 3.5–5.1)
SODIUM: 141 mmol/L (ref 135–145)

## 2015-12-09 LAB — CBC
HEMATOCRIT: 40.2 % (ref 39.0–52.0)
Hemoglobin: 12.5 g/dL — ABNORMAL LOW (ref 13.0–17.0)
MCH: 30.5 pg (ref 26.0–34.0)
MCHC: 31.1 g/dL (ref 30.0–36.0)
MCV: 98 fL (ref 78.0–100.0)
Platelets: 173 10*3/uL (ref 150–400)
RBC: 4.1 MIL/uL — ABNORMAL LOW (ref 4.22–5.81)
RDW: 12.9 % (ref 11.5–15.5)
WBC: 10 10*3/uL (ref 4.0–10.5)

## 2015-12-09 MED ORDER — PHENOL 1.4 % MT LIQD
1.0000 | OROMUCOSAL | Status: DC | PRN
  Administered 2015-12-09: 1 via OROMUCOSAL
  Filled 2015-12-09: qty 177

## 2015-12-09 NOTE — Progress Notes (Signed)
Orthopedic Tech Progress Note Patient Details:  [name] [birth year] [id number]  CPM Right Knee CPM Right Knee: On Right Knee Flexion (Degrees): 0 Right Knee Extension (Degrees): 90 Additional Comments:  (per Cyril Mourning pt is to stay off cpm until further notice)   [name] 12/09/2015, 2:16 PM

## 2015-12-09 NOTE — Progress Notes (Signed)
PT Cancellation Note  Patient Details Name: [name] MRN: [id number] DOB: [birth year]   Cancelled Treatment:    Reason Eval/Treat Not Completed: Fatigue/lethargy limiting ability to participate; patient back in bed and lethargic, though arouseable.  Set up dinner tray and deferred therapy due to recently walked and back to bed per pt.    Reginia Naas 12/09/2015, 4:52 PM  Magda Kiel, [place] 12/09/2015

## 2015-12-09 NOTE — Progress Notes (Signed)
Subjective: 1 Day Post-Op Procedure(s) (LRB): TOTAL KNEE ARTHROPLASTY (Right) Patient reports pain as mild.  "I did not remember having surgery yesterday "taking by mouth. Able to void okay this a.m.  Objective: Vital signs in last 24 hours: Temp:  [97.2 F (36.2 C)-98.5 F (36.9 C)] 98.5 F (36.9 C) (09/19 0712) Pulse Rate:  [55-82] 78 (09/19 0712) Resp:  [12-18] 18 (09/19 0712) BP: (119-155)/(52-95) 149/81 (09/19 0712) SpO2:  [95 %-100 %] 98 % (09/19 0712) Weight:  [90.1 kg (198 lb 9.6 oz)] 90.1 kg (198 lb 9.6 oz) (09/18 1131)  Intake/Output from previous day: 09/18 0701 - 09/19 0700 In: 2368.3 [I.V.:2313.3; IV Piggyback:55] Out: 850 [Urine:750; Blood:100] Intake/Output this shift: No intake/output data recorded.   Recent Labs  12/09/15 0416  HGB 12.5*    Recent Labs  12/09/15 0416  WBC 10.0  RBC 4.10*  HCT 40.2  PLT 173    Recent Labs  12/09/15 0416  NA 141  K 5.4*  CL 109  CO2 25  BUN 32*  CREATININE 2.00*  GLUCOSE 147*  CALCIUM 9.0   No results for input(s): LABPT, INR in the last 72 hours. Right knee exam: Neurovascular intact Sensation intact distally Intact pulses distally Dorsiflexion/Plantar flexion intact Incision: dressing C/D/I Compartment soft  Assessment/Plan: 1 Day Post-Op Procedure(s) (LRB): TOTAL KNEE ARTHROPLASTY (Right) Hyperkalemia Plan: Discontinue Celebrex. Aspirin 325 mg twice daily 1 month for DVT prophylaxis. Weight-bear as tolerated on right. Watch potassium. Check bmet in a.m. Continue IV fluids 24 more hours. Up with therapy Discharge home with home health if does not make good progress may need skilled nursing facility.  Nichole Keltner G 12/09/2015, 8:28 AM

## 2015-12-09 NOTE — Evaluation (Signed)
Physical Therapy Evaluation Patient Details Name: [name] MRN: [id number] DOB: 09-Jun-1940 Today's Date: 12/09/2015   History of Present Illness  Patient is a 75 y/o male s/p R TKA.  PMH positive for diverticulitis, bipolar, venous insufficiency, neuropathy, HTN, CVA, OSA, CKD.  Clinical Impression  Patient presents with decreased independence with mobility due to deficits listed in PT problem list.  He likely will need SNF rehab prior to d/c home, though he hopes to improve to be able to go home.  Has two level home with flight to bedrooms and girlfriend works.  Feel he will also benefit from skilled PT in the acute setting to progress mobility and independence prior to d/c.     Follow Up Recommendations      Equipment Recommendations  None recommended by PT    Recommendations for Other Services       Precautions / Restrictions Precautions Precautions: Fall;Knee Required Braces or Orthoses: Knee Immobilizer - Right Knee Immobilizer - Right: On when out of bed or walking Restrictions RLE Weight Bearing: Weight bearing as tolerated      Mobility  Bed Mobility Overal bed mobility: Needs Assistance Bed Mobility: Supine to Sit     Supine to sit: Min assist     General bed mobility comments: assist for R LE  Transfers Overall transfer level: Needs assistance Equipment used: Rolling walker (2 wheeled) Transfers: Sit to/from Stand Sit to Stand: Mod assist         General transfer comment: lifting assist from bed, posterior bias initially  Ambulation/Gait Ambulation/Gait assistance: Min assist Ambulation Distance (Feet): 22 Feet Assistive device: Rolling walker (2 wheeled) Gait Pattern/deviations: Step-to pattern;Step-through pattern;Antalgic;Decreased stance time - right;Trunk flexed     General Gait Details: cues for posture, assist for balance, gradually improved anterior weight shift  Stairs            Wheelchair Mobility    Modified Rankin  (Stroke Patients Only)       Balance Overall balance assessment: Needs assistance   Sitting balance-Leahy Scale: Fair     Standing balance support: Bilateral upper extremity supported Standing balance-Leahy Scale: Poor Standing balance comment: mod A with walker initially for static balance                             Pertinent Vitals/Pain Pain Assessment: 0-10 Pain Score: 7  Pain Location: L foot (arthritis) Pain Descriptors / Indicators: Aching Pain Intervention(s): Monitored during session    Home Living Family/patient expects to be discharged to:: Private residence Living Arrangements: Spouse/significant other Available Help at Discharge: Family;Available PRN/intermittently Type of Home: House Home Access: Stairs to enter Entrance Stairs-Rails: Right Entrance Stairs-Number of Steps: 5 Home Layout: Two level Home Equipment: Walker - 2 wheels;Shower seat;Bedside commode      Prior Function Level of Independence: Independent               Hand Dominance        Extremity/Trunk Assessment               Lower Extremity Assessment: RLE deficits/detail RLE Deficits / Details: ankle AROM grossly WFL, knee flexion AAROM about 40, strength about 3-/5 knee extension 2/5 hip flexion    Cervical / Trunk Assessment: Kyphotic  Communication   Communication: No difficulties  Cognition Arousal/Alertness: Lethargic Behavior During Therapy: WFL for tasks assessed/performed Overall Cognitive Status: Within Functional Limits for tasks assessed (but reports did not recall having surgery)  General Comments      Exercises Total Joint Exercises Ankle Circles/Pumps: AROM;Both;10 reps;Supine Quad Sets: AROM;Both;10 reps;Supine Short Arc Quad: AAROM;AROM;Right;10 reps;Supine Hip ABduction/ADduction: AAROM;Right;10 reps;Supine Straight Leg Raises: AAROM;Right;10 reps;Supine Knee Flexion: AAROM;Right;10  reps;Supine Goniometric ROM: 15-40   Assessment/Plan    PT Assessment Patient needs continued PT services  PT Problem List Decreased strength;Decreased activity tolerance;Decreased range of motion;Decreased mobility;Decreased safety awareness;Pain;Decreased knowledge of use of DME;Decreased balance          PT Treatment Interventions DME instruction;Gait training;Functional mobility training;Stair training;Balance training;Therapeutic exercise;Therapeutic activities;Patient/family education    PT Goals (Current goals can be found in the Care Plan section)  Acute Rehab PT Goals Patient Stated Goal: To return home PT Goal Formulation: With patient Time For Goal Achievement: 12/13/15 Potential to Achieve Goals: Good    Frequency 7X/week   Barriers to discharge        Co-evaluation               End of Session Equipment Utilized During Treatment: Gait belt;Right knee immobilizer Activity Tolerance: Patient tolerated treatment well Patient left: in chair;with call bell/phone within reach;with chair alarm set           Time: 4827-0786 PT Time Calculation (min) (ACUTE ONLY): 32 min   Charges:   PT Evaluation $PT Eval Moderate Complexity: 1 Procedure PT Treatments $Gait Training: 8-22 mins   PT G CodesReginia Naas 12/26/15, 1:54 PM  Magda Kiel, [place] December 26, 2015

## 2015-12-09 NOTE — Progress Notes (Signed)
Patient wife brought in home nasal pillows for patient to wear while on CPAP. When RT entered room, patient was sleeping comfortably. When asked if he wanted to switch out his mask to his home nasal pillows, he said he was fine with what he had on. Nasal pillows and chin strap are in room for patient and RT will be glad to switch out for him upon request.

## 2015-12-10 LAB — CBC
HCT: 36.7 % — ABNORMAL LOW (ref 39.0–52.0)
Hemoglobin: 11.6 g/dL — ABNORMAL LOW (ref 13.0–17.0)
MCH: 30.8 pg (ref 26.0–34.0)
MCHC: 31.6 g/dL (ref 30.0–36.0)
MCV: 97.3 fL (ref 78.0–100.0)
PLATELETS: 178 10*3/uL (ref 150–400)
RBC: 3.77 MIL/uL — AB (ref 4.22–5.81)
RDW: 13.1 % (ref 11.5–15.5)
WBC: 16.5 10*3/uL — AB (ref 4.0–10.5)

## 2015-12-10 LAB — BASIC METABOLIC PANEL
ANION GAP: 6 (ref 5–15)
BUN: 35 mg/dL — ABNORMAL HIGH (ref 6–20)
CHLORIDE: 112 mmol/L — AB (ref 101–111)
CO2: 22 mmol/L (ref 22–32)
CREATININE: 1.9 mg/dL — AB (ref 0.61–1.24)
Calcium: 9.1 mg/dL (ref 8.9–10.3)
GFR calc non Af Amer: 33 mL/min — ABNORMAL LOW (ref >60)
GFR, EST AFRICAN AMERICAN: 38 mL/min — AB (ref >60)
Glucose, Bld: 134 mg/dL — ABNORMAL HIGH (ref 65–99)
POTASSIUM: 4.8 mmol/L (ref 3.5–5.1)
SODIUM: 140 mmol/L (ref 135–145)

## 2015-12-10 NOTE — Brief Op Note (Signed)
12/08/2015  1:56 PM  PATIENT:  [name]  75 y.o. male  PRE-OPERATIVE DIAGNOSIS:  OSTEOARTHRITIS RIGHT KNEE   POST-OPERATIVE DIAGNOSIS:  same  PROCEDURE:  Procedure(s): TOTAL KNEE ARTHROPLASTY (Right)  SURGEON:  Surgeon(s) and Role:    * Dorna Leitz, MD - Primary  PHYSICIAN ASSISTANT:   ASSISTANTS: bethune   ANESTHESIA:   general  EBL:  Total I/O In: 240 [P.O.:240] Out: 500 [Urine:500]  BLOOD ADMINISTERED:none  DRAINS: none   LOCAL MEDICATIONS USED:  MARCAINE    and OTHER experel  SPECIMEN:  No Specimen  DISPOSITION OF SPECIMEN:  N/A  COUNTS:  YES  TOURNIQUET:   Total Tourniquet Time Documented: Thigh (Right) - 49 minutes Total: Thigh (Right) - 49 minutes   DICTATION: .Other Dictation: Dictation Number [id number]  PLAN OF CARE: Admit to inpatient   PATIENT DISPOSITION:  PACU - hemodynamically stable.   Delay start of Pharmacological VTE agent (>24hrs) due to surgical blood loss or risk of bleeding: no

## 2015-12-10 NOTE — Progress Notes (Signed)
Physical Therapy Treatment Patient Details Name: [name] MRN: [id number] DOB: Mar 27, 1940 Today's Date: 12/10/2015    History of Present Illness Patient is a 75 y/o male s/p R TKA.  PMH positive for diverticulitis, bipolar, venous insufficiency, neuropathy, HTN, CVA, OSA, CKD.    PT Comments    Pt performed increased mobility but remains to require min assist with transfers and gait.  Pt continues to progress slowly and will benefit from skilled rehab before returning home.    Follow Up Recommendations   SNF     Equipment Recommendations  None recommended by PT    Recommendations for Other Services       Precautions / Restrictions Precautions Precautions: Fall;Knee Required Braces or Orthoses: Knee Immobilizer - Right Knee Immobilizer - Right: On when out of bed or walking Restrictions Weight Bearing Restrictions: Yes RLE Weight Bearing: Weight bearing as tolerated    Mobility  Bed Mobility Overal bed mobility: Needs Assistance Bed Mobility: Supine to Sit     Supine to sit: Supervision     General bed mobility comments: Pt required increased time but able to advance without assistance.    Transfers Overall transfer level: Needs assistance Equipment used: Rolling walker (2 wheeled) Transfers: Sit to/from Stand Sit to Stand: Min assist         General transfer comment: Cues for hand placement and boost to achieve standing.  Posterior lean noted initially in standing.    Ambulation/Gait Ambulation/Gait assistance: Min assist Ambulation Distance (Feet): 150 Feet Assistive device: Rolling walker (2 wheeled) Gait Pattern/deviations: Step-through pattern;Trunk flexed;Antalgic;Leaning posteriorly     General Gait Details: Pt leaning posteriorly initially but as gait progress improved.  Cues for forward gaze and upper trunk control, cues to keep RW close in proxmity.  pt observed pushing RW out to far.     Stairs            Wheelchair Mobility     Modified Rankin (Stroke Patients Only)       Balance Overall balance assessment: Needs assistance   Sitting balance-Leahy Scale: Fair       Standing balance-Leahy Scale: Poor                      Cognition Arousal/Alertness: Awake/alert Behavior During Therapy: WFL for tasks assessed/performed Overall Cognitive Status: Within Functional Limits for tasks assessed                      Exercises Total Joint Exercises Ankle Circles/Pumps: AROM;Both;10 reps;Supine Quad Sets: AROM;Right;10 reps;Supine Towel Squeeze: AROM;Both;10 reps;Supine Short Arc Quad: AROM;Right;10 reps;Supine Heel Slides: AROM;Right;10 reps;Supine Hip ABduction/ADduction: AROM;Right;10 reps;Supine Straight Leg Raises: Right;10 reps;Supine;AAROM Goniometric ROM: 73 degrees R knee flexion.      General Comments        Pertinent Vitals/Pain Pain Assessment: 0-10 Pain Score: 7  Pain Location: R knee, reports L foot feels better.   Pain Descriptors / Indicators: Grimacing;Guarding Pain Intervention(s): Monitored during session;Repositioned;Ice applied    Home Living                      Prior Function            PT Goals (current goals can now be found in the care plan section) Acute Rehab PT Goals Patient Stated Goal: To go to rehab PT Goal Formulation: With patient Potential to Achieve Goals: Good Progress towards PT goals: Progressing toward goals    Frequency    7X/week  PT Plan Current plan remains appropriate    Co-evaluation             End of Session Equipment Utilized During Treatment: Gait belt;Right knee immobilizer Activity Tolerance: Patient tolerated treatment well Patient left: in chair;with call bell/phone within reach     Time: 7276-1848 PT Time Calculation (min) (ACUTE ONLY): 32 min  Charges:  $Gait Training: 8-22 mins $Therapeutic Exercise: 8-22 mins                    G Codes:      Cristela Blue 09-Jan-2016, 11:23  AM  Governor Rooks, PTA pager [phone]

## 2015-12-10 NOTE — Progress Notes (Signed)
Subjective: 2 Days Post-Op Procedure(s) (LRB): TOTAL KNEE ARTHROPLASTY (Right) Patient reports pain as mild. Slow progress with physical therapy. Denies dizziness. Denies Shortness of breath or chest pain. Taking by mouth and voiding okay.  Objective: Vital signs in last 24 hours: Temp:  [97.1 F (36.2 C)-98.1 F (36.7 C)] 97.1 F (36.2 C) (09/20 0551) Pulse Rate:  [73-98] 74 (09/20 0551) Resp:  [18] 18 (09/20 0551) BP: (127-139)/(68-88) 132/68 (09/20 0551) SpO2:  [95 %-97 %] 97 % (09/20 0551)  Intake/Output from previous day: 09/19 0701 - 09/20 0700 In: 1258.3 [P.O.:840; I.V.:418.3] Out: 700 [Urine:700] Intake/Output this shift: No intake/output data recorded.   Recent Labs  12/09/15 0416 12/10/15 0444  HGB 12.5* 11.6*    Recent Labs  12/09/15 0416 12/10/15 0444  WBC 10.0 16.5*  RBC 4.10* 3.77*  HCT 40.2 36.7*  PLT 173 178    Recent Labs  12/09/15 0416 12/10/15 0444  NA 141 140  K 5.4* 4.8  CL 109 112*  CO2 25 22  BUN 32* 35*  CREATININE 2.00* 1.90*  GLUCOSE 147* 134*  CALCIUM 9.0 9.1   No results for input(s): LABPT, INR in the last 72 hours. Right knee exam: Neurovascular intact Sensation intact distally Intact pulses distally Dorsiflexion/Plantar flexion intact Incision: dressing C/D/I Compartment soft  Assessment/Plan: 2 Days Post-Op Procedure(s) (LRB): TOTAL KNEE ARTHROPLASTY (Right) Plan: Weight-bear as tolerated on right. Continue aspirin 325 mg twice daily for DVT prophylaxis. He has made slow progress with physical therapy. Based on his lack of progress with PT/clinically he needs a skilled nursing facility. Up with therapy Discharge to SNF hopefully tomorrow. Social work consult obtained.  [place] G 12/10/2015, 9:15 AM

## 2015-12-10 NOTE — Progress Notes (Signed)
Physical Therapy Treatment Patient Details Name: [name] MRN: [id number] DOB: 11/15/1940 Today's Date: 12/10/2015    History of Present Illness Patient is a 75 y/o male s/p R TKA.  PMH positive for diverticulitis, bipolar, venous insufficiency, neuropathy, HTN, CVA, OSA, CKD.    PT Comments    Pt continues to be unsteady with noticeable LOB.  Pt remains a good candidate for rehab in post acute setting.  Will continue rehab during acute hospitalization.    Follow Up Recommendations        Equipment Recommendations  None recommended by PT    Recommendations for Other Services       Precautions / Restrictions Precautions Precautions: Fall;Knee Required Braces or Orthoses: Knee Immobilizer - Right Knee Immobilizer - Right: On when out of bed or walking Restrictions Weight Bearing Restrictions: Yes RLE Weight Bearing: Weight bearing as tolerated    Mobility  Bed Mobility Overal bed mobility: Needs Assistance Bed Mobility: Sit to Supine     Supine to sit: Supervision Sit to supine: Supervision   General bed mobility comments: Pt required increased time but able to advance back to bed without assistance.    Transfers Overall transfer level: Needs assistance Equipment used: Rolling walker (2 wheeled) Transfers: Sit to/from Stand Sit to Stand: Min assist         General transfer comment: Cues for hand placement and boost to achieve standing.  Posterior lean noted initially in standing.  Pt performed from chair and BSC, posterior lean remains with LOB x3 during toileting.    Ambulation/Gait Ambulation/Gait assistance: Min assist Ambulation Distance (Feet): 150 Feet Assistive device: Rolling walker (2 wheeled) Gait Pattern/deviations: Step-through pattern;Trunk flexed;Antalgic;Leaning posteriorly   Gait velocity interpretation: Below normal speed for age/gender General Gait Details: Pt leaning posteriorly initially but as gait progress improved.  Cues for  forward gaze and upper trunk control, cues to keep RW close in proxmity.  pt observed pushing RW out to far.     Stairs            Wheelchair Mobility    Modified Rankin (Stroke Patients Only)       Balance Overall balance assessment: Needs assistance   Sitting balance-Leahy Scale: Fair       Standing balance-Leahy Scale: Poor                      Cognition Arousal/Alertness: Awake/alert Behavior During Therapy: WFL for tasks assessed/performed Overall Cognitive Status: Within Functional Limits for tasks assessed                      Exercises Total Joint Exercises Ankle Circles/Pumps: AROM;Both;10 reps;Supine Quad Sets: AROM;Right;10 reps;Supine Towel Squeeze: AROM;Both;10 reps;Supine Short Arc Quad: AROM;Right;10 reps;Supine Heel Slides: AROM;Right;10 reps;Supine Hip ABduction/ADduction: AROM;Right;10 reps;Supine Straight Leg Raises: Right;10 reps;Supine;AAROM Goniometric ROM: 73 degrees R knee flexion.      General Comments        Pertinent Vitals/Pain Pain Assessment: 0-10 Pain Score: 6  Pain Location: R knee and L foot.   Pain Descriptors / Indicators: Grimacing;Guarding Pain Intervention(s): Monitored during session;Repositioned    Home Living                      Prior Function            PT Goals (current goals can now be found in the care plan section) Acute Rehab PT Goals Patient Stated Goal: To go to rehab PT Goal Formulation: With  patient Potential to Achieve Goals: Good Progress towards PT goals: Progressing toward goals    Frequency    7X/week      PT Plan Current plan remains appropriate    Co-evaluation             End of Session Equipment Utilized During Treatment: Gait belt Activity Tolerance: Patient tolerated treatment well Patient left: in bed;in CPM;with call bell/phone within reach     Time: 1425-1500 PT Time Calculation (min) (ACUTE ONLY): 35 min  Charges:  $Gait Training:  8-22 mins $Therapeutic Exercise: 8-22 mins                    G Codes:      Cristela Blue 12/13/2015, 3:06 PM Governor Rooks, PTA pager [phone]

## 2015-12-10 NOTE — NC FL2 (Signed)
[place] LEVEL OF CARE SCREENING TOOL     IDENTIFICATION  Patient Name: [name] Birthdate: 1940-04-03 Sex: male Admission Date (Current Location): 12/08/2015  [facility] and [place] Number:  Herbalist and Address:  [facility] [place] [address] [place], [place] [address]      Provider Number: [id number]  Attending Physician Name and Address:  Dorna Leitz, MD  Relative Name and Phone Number:       Current Level of Care: Hospital Recommended Level of Care: [place] Prior Approval Number:    Date Approved/Denied:   PASRR Number:  [id number])  Discharge Plan: SNF    Current Diagnoses: Patient Active Problem List   Diagnosis Date Noted  . Primary osteoarthritis of right knee 12/08/2015  . Pain in joint, ankle and foot 10/07/2015  . Shingles 05/27/2015  . Diverticulitis of colon 05/27/2015  . Fatty liver 10/09/2014  . Gallstones 10/09/2014  . Bilateral lower extremity edema 09/25/2014  . Weight gain 09/25/2014  . Weakness of both lower extremities 06/05/2014  . Chronic venous insufficiency 08/29/2013  . Right shoulder pain 08/29/2013  . Proximal humerus fracture 05/22/2013  . Neuropathy ([place]) 01/20/2011  . Hypothyroidism 10/03/2009  . C V A/STROKE 02/11/2009  . KNEE PAIN 01/02/2009  . CHRONIC VENOUS HYPERTENSION WITH INFLAMMATION 10/30/2008  . Obstructive sleep apnea 10/30/2008  . Osteoporosis 09/25/2008  . INTERNAL THROMBOSED HEMORRHOIDS 07/05/2008  . OTHER SPECIFIED SITES OF SPRAINS AND STRAINS 07/05/2008  . Chronic kidney disease (CKD), stage III (moderate) 03/01/2008  . IRON DEFICIENCY ANEMIA SECONDARY TO BLOOD LOSS 04/11/2007  . Essential hypertension 04/11/2007  . ADVEF, DRUG/MEDICINAL/BIOLOGICAL SUBST NOS 01/06/2007  . Mild depressed bipolar I disorder ([place]) 09/21/2006  . DIVERTICULOSIS, COLON 09/21/2006  . IRRITABLE BOWEL SYNDROME 09/21/2006    Orientation RESPIRATION BLADDER Height  & Weight     Self, Time, Situation, Place  Normal Continent Weight: 198 lb 9.6 oz (90.1 kg) Height:  5\' 6"  (167.6 cm)  BEHAVIORAL SYMPTOMS/MOOD NEUROLOGICAL BOWEL NUTRITION STATUS      Continent    AMBULATORY STATUS COMMUNICATION OF NEEDS Skin   Extensive Assist Verbally Normal                       Personal Care Assistance Level of Assistance  Bathing, Dressing Bathing Assistance: Limited assistance   Dressing Assistance: Limited assistance     Functional Limitations Info             SPECIAL CARE FACTORS FREQUENCY                       Contractures      Additional Factors Info   (Full)               Current Medications (12/10/2015):  This is the current hospital active medication list Current Facility-Administered Medications  Medication Dose Route Frequency Provider Last Rate Last Dose  . 0.9 %  sodium chloride infusion   Intravenous Continuous Gary Fleet, PA-C 100 mL/hr at 12/10/15 0324    . acetaminophen (TYLENOL) tablet 650 mg  650 mg Oral Q6H PRN Gary Fleet, PA-C   650 mg at 12/10/15 1424   Or  . acetaminophen (TYLENOL) suppository 650 mg  650 mg Rectal Q6H PRN Gary Fleet, PA-C      . alum & mag hydroxide-simeth (MAALOX/MYLANTA) 200-200-20 MG/5ML suspension 30 mL  30 mL Oral Q4H PRN Gary Fleet, PA-C      .  ARIPiprazole (ABILIFY) tablet 2.5 mg  2.5 mg Oral Daily Gary Fleet, PA-C   2.5 mg at 12/10/15 1041  . aspirin EC tablet 325 mg  325 mg Oral BID PC Gary Fleet, PA-C   325 mg at 12/10/15 5916  . bisacodyl (DULCOLAX) EC tablet 5 mg  5 mg Oral Daily PRN Gary Fleet, PA-C      . diphenhydrAMINE (BENADRYL) 12.5 MG/5ML elixir 12.5-25 mg  12.5-25 mg Oral Q4H PRN Gary Fleet, PA-C      . docusate sodium (COLACE) capsule 100 mg  100 mg Oral BID Gary Fleet, PA-C   100 mg at 12/10/15 1041  . gabapentin (NEURONTIN) capsule 300 mg  300 mg Oral BID Gary Fleet, PA-C   300 mg at 12/10/15 1041  . HYDROmorphone (DILAUDID) injection  0.5-1 mg  0.5-1 mg Intravenous Q3H PRN Gary Fleet, PA-C   1 mg at 12/08/15 2039  . magnesium citrate solution 1 Bottle  1 Bottle Oral Once PRN Gary Fleet, PA-C      . methocarbamol (ROBAXIN) tablet 500 mg  500 mg Oral Q6H PRN Gary Fleet, PA-C       Or  . methocarbamol (ROBAXIN) 500 mg in dextrose 5 % 50 mL IVPB  500 mg Intravenous Q6H PRN Gary Fleet, PA-C   500 mg at 12/08/15 1753  . ondansetron (ZOFRAN) tablet 4 mg  4 mg Oral Q6H PRN Gary Fleet, PA-C       Or  . ondansetron [facility]) injection 4 mg  4 mg Intravenous Q6H PRN Gary Fleet, PA-C      . oxyCODONE (Oxy IR/ROXICODONE) immediate release tablet 5-10 mg  5-10 mg Oral Q3H PRN Gary Fleet, PA-C   5 mg at 12/09/15 1027  . phenol (CHLORASEPTIC) mouth spray 1 spray  1 spray Mouth/Throat PRN Dorna Leitz, MD   1 spray at 12/09/15 1650  . polyethylene glycol (MIRALAX / GLYCOLAX) packet 17 g  17 g Oral Daily PRN Gary Fleet, PA-C      . tranylcypromine (PARNATE) tablet 30 mg  30 mg Oral BID Gary Fleet, PA-C   30 mg at 12/10/15 1040  . zolpidem (AMBIEN) tablet 5 mg  5 mg Oral QHS PRN Gary Fleet, PA-C         Discharge Medications: Please see discharge summary for a list of discharge medications.  Relevant Imaging Results:  Relevant Lab Results:   Additional Information  (SS: [id number])  Bernita Buffy

## 2015-12-10 NOTE — Clinical Social Work Note (Signed)
Clinical Social Work Assessment  Patient Details  Name: [name] MRN: [id number] Date of Birth: 05/14/1940  Date of referral:  12/10/15               Reason for consult:  Facility Placement                Permission sought to share information with:   (Facilities) Permission granted to share information::   (Facilities)  Name::        Agency::     Relationship::     Contact Information:     Housing/Transportation Living arrangements for the past 2 months:  Single Family Home (Patient states that he lives at home in [place] with his Girl friend.) Source of Information:  Patient Patient Interpreter Needed:  None Criminal Activity/Legal Involvement Pertinent to Current Situation/Hospitalization:  No - Comment as needed Significant Relationships:  Significant Other ([name]/ [phone]) Lives with:  Significant Other Do you feel safe going back to the place where you live?   (Patient is interested in facility.) Need for family participation in patient care:  Yes (Comment)  Care giving concerns:  Patient states that upon discharge if rehab is needed he would be interested in SNF. Patient states that he wants the names of facilities who offer a bed.   Social Worker assessment / plan:  SW met with patient at bedside. There was no family present. Patient was alert and oriented. Patient states that he presents to hospital due to R knee replacement. Patient states that he needs assistance with ADLs and is interested in SNF. SW will refer pt to SNF.  Employment status:  Retired Forensic scientist:   Education officer, environmental.) PT Recommendations:  [place] / Referral to community resources:   (SNF)  Patient/Family's Response to care:  Appropriate.  Patient/Family's Understanding of and Emotional Response to Diagnosis, Current Treatment, and Prognosis:  No questions.  Emotional Assessment Appearance:  Appears stated age Attitude/Demeanor/Rapport:    (Appropriate.) Affect (typically observed):  Accepting Orientation:  Oriented to Self, Oriented to Place, Oriented to  Time, Oriented to Situation Alcohol / Substance use:  Not Applicable Psych involvement (Current and /or in the community):  No (Comment)  Discharge Needs  Concerns to be addressed:  No discharge needs identified Readmission within the last 30 days:  No Current discharge risk:  None Barriers to Discharge:  No Barriers Identified   Bernita Buffy 12/10/2015, 2:33 PM

## 2015-12-10 NOTE — Care Management Note (Signed)
Case Management Note  Patient Details  Name: Wendall Isabell MRN: [id number] Date of Birth: 24-Oct-1940  Subjective/Objective:   75 yr old male s/p right total knee arthoplasty.                 Action/Plan: patient will need shortterm rehab at [facility]. Social worker is aware. No further Case Manager needs.    Expected Discharge Date:   12/11/15               Expected Discharge Plan:   [place]  In-House Referral:   Social Worker  Discharge planning Services     Post Acute Care Choice:    Choice offered to:     DME Arranged:   NA DME Agency:     HH Arranged:   NA HH Agency:     Status of Service:   Completed.  If discussed at [place] of Stay Meetings, dates discussed:    Additional Comments:  Ninfa Meeker, RN 12/10/2015, 2:16 PM

## 2015-12-11 DIAGNOSIS — Z471 Aftercare following joint replacement surgery: Secondary | ICD-10-CM | POA: Diagnosis not present

## 2015-12-11 DIAGNOSIS — M1711 Unilateral primary osteoarthritis, right knee: Secondary | ICD-10-CM | POA: Diagnosis not present

## 2015-12-11 DIAGNOSIS — N183 Chronic kidney disease, stage 3 (moderate): Secondary | ICD-10-CM | POA: Diagnosis not present

## 2015-12-11 DIAGNOSIS — R531 Weakness: Secondary | ICD-10-CM | POA: Diagnosis not present

## 2015-12-11 DIAGNOSIS — N189 Chronic kidney disease, unspecified: Secondary | ICD-10-CM | POA: Diagnosis not present

## 2015-12-11 DIAGNOSIS — M6281 Muscle weakness (generalized): Secondary | ICD-10-CM | POA: Diagnosis not present

## 2015-12-11 DIAGNOSIS — Z411 Encounter for cosmetic surgery: Secondary | ICD-10-CM | POA: Diagnosis not present

## 2015-12-11 DIAGNOSIS — I10 Essential (primary) hypertension: Secondary | ICD-10-CM | POA: Diagnosis not present

## 2015-12-11 DIAGNOSIS — Z96659 Presence of unspecified artificial knee joint: Secondary | ICD-10-CM | POA: Diagnosis not present

## 2015-12-11 DIAGNOSIS — Z96651 Presence of right artificial knee joint: Secondary | ICD-10-CM | POA: Diagnosis not present

## 2015-12-11 DIAGNOSIS — M25569 Pain in unspecified knee: Secondary | ICD-10-CM | POA: Diagnosis not present

## 2015-12-11 DIAGNOSIS — D72829 Elevated white blood cell count, unspecified: Secondary | ICD-10-CM | POA: Diagnosis not present

## 2015-12-11 DIAGNOSIS — R2689 Other abnormalities of gait and mobility: Secondary | ICD-10-CM | POA: Diagnosis not present

## 2015-12-11 DIAGNOSIS — R600 Localized edema: Secondary | ICD-10-CM | POA: Diagnosis not present

## 2015-12-11 DIAGNOSIS — E039 Hypothyroidism, unspecified: Secondary | ICD-10-CM | POA: Diagnosis not present

## 2015-12-11 LAB — BASIC METABOLIC PANEL
ANION GAP: 7 (ref 5–15)
BUN: 40 mg/dL — AB (ref 6–20)
CHLORIDE: 113 mmol/L — AB (ref 101–111)
CO2: 21 mmol/L — AB (ref 22–32)
Calcium: 9.2 mg/dL (ref 8.9–10.3)
Creatinine, Ser: 1.74 mg/dL — ABNORMAL HIGH (ref 0.61–1.24)
GFR calc Af Amer: 42 mL/min — ABNORMAL LOW (ref >60)
GFR calc non Af Amer: 37 mL/min — ABNORMAL LOW (ref >60)
GLUCOSE: 117 mg/dL — AB (ref 65–99)
POTASSIUM: 4.2 mmol/L (ref 3.5–5.1)
Sodium: 141 mmol/L (ref 135–145)

## 2015-12-11 LAB — CBC
HEMATOCRIT: 34.9 % — AB (ref 39.0–52.0)
HEMOGLOBIN: 11.3 g/dL — AB (ref 13.0–17.0)
MCH: 31.1 pg (ref 26.0–34.0)
MCHC: 32.4 g/dL (ref 30.0–36.0)
MCV: 96.1 fL (ref 78.0–100.0)
PLATELETS: 199 10*3/uL (ref 150–400)
RBC: 3.63 MIL/uL — AB (ref 4.22–5.81)
RDW: 13.3 % (ref 11.5–15.5)
WBC: 17.8 10*3/uL — ABNORMAL HIGH (ref 4.0–10.5)

## 2015-12-11 NOTE — Op Note (Deleted)
  The note originally documented on this encounter has been moved the the encounter in which it belongs.  

## 2015-12-11 NOTE — Op Note (Signed)
NAMERashied, [name]              ACCOUNT NO.:  [id number]  MEDICAL RECORD NO.:  [id number]  LOCATION:                               FACILITY:  [facility]  PHYSICIAN:  Alta Corning, M.D.   DATE OF BIRTH:  1940/09/17  DATE OF PROCEDURE:  12/08/2015 DATE OF DISCHARGE:                              OPERATIVE REPORT   PREOPERATIVE DIAGNOSIS:  End-stage degenerative joint disease, right knee with severe bone-on-bone change.  POSTOPERATIVE DIAGNOSIS:  End-stage degenerative joint disease, right knee with severe bone-on-bone change.  PROCEDURE:  Right total knee replacement with attune system, size 6 femur, size 8 tibia, and a size 6 bridging bearing, and a 41 mm all polyethylene patella.  ASSISTANT:  [name], P.A.  ANESTHESIA:  General.  BRIEF HISTORY:  [name] is a 75 year old male with a long history significant complaints of right knee pain.  We treated conservatively for prolonged period of time.  He is having night pain and light activity pain and having difficulty with walking.  After failure of all conservative care, he was taken to the operating room for right total knee replacement.  DESCRIPTION OF PROCEDURE:  The patient was taken to the operating room. After adequate anesthesia was obtained with general anesthetic, the patient was placed supine on the operating table, and right leg was prepped and draped in sterile fashion.  Following this, the leg was exsanguinated.  Tourniquet inflated to 300 mmHg.  Following this, a midline incision was made, subcutaneous tissue down to the level of the extensor mechanism, medial parapatellar arthrotomy was undertaken. Medial and lateral meniscus were removed.  Retropatellar fat pad, synovium on the anterior aspect of the femur, anterior posterior cruciates.  Once this was completed, attention turned to the femur where an intramedullary pilot hole was drilled and a 4-degree valgus block was used and 9 mm of distal bone was  resected.  Following this, attention was turned toward sizing the femur with a 3-degree external rotational block.  Once this was done, tensor placed and then it was checked with rotation relative to [facility] line being perpendicular and relative to the epicondylar axis.  Once it was satisfied, the rotation was good, a size 6 block was placed and the anterior-posterior cuts were made, chamfers and box.  Attention turned to the tibia, where it sized to an 8.  It was drilled and keeled.  Trials were put in place with a size 6 spacer which seems to be excellent in terms of stability and range of motion.  Attention then turned to the patella, cut down to a level of 13 mm.  A 41 paddle was chosen.  Lugs were drilled from the patella.  The patella button was put in place.  Knee put through range of motion. Excellent stability and range of motion were achieved at this point. Following this, the trial components were removed.  The knee was copiously and thoroughly lavaged, pulsatile lavage, irrigation and suctioned dry.  The final components were then cemented into place size 8 tibia, size 6 femur, size 6 bearing was placed trial and the 41 all poly patella was placed and held with a clamp.  All excess bone cement was  removed and allowed the cement to completely harden.  Once cement was completely hardened, the tourniquet was let down.  All bleeding was controlled with electrocautery.  The trial bearing was removed.  Exparel 60 mL of 20 mL Marcaine, 20 mL Exparel, 20 mL saline put into a syringe, mixed and then injected throughout the synovial reflection for postoperative pain control.  At this point, the final poly size 6 was placed. The knee put through a range of motion, excellent stability and range of motion were achieved.  The medial parapatellar arthrotomy was closed with 1 Vicryl running, the skin was closed with 0 and 2-0 Vicryl, and skin staples.  Sterile compressive dressing was  applied.  The patient was taken to the recovery and was noted to be in satisfactory condition.  Estimated blood for procedure is minimal.     Alta Corning, M.D.   ______________________________ Alta Corning, M.D.    [name]  D:  12/10/2015  T:  12/11/2015  Job:  [id number]

## 2015-12-11 NOTE — Care Management Important Message (Signed)
Important Message  Patient Details  Name: [name] MRN: [id number] Date of Birth: [birth year]   Medicare Important Message Given:  Yes    Jenipher Havel Montine Circle 12/11/2015, 10:40 AM

## 2015-12-11 NOTE — Progress Notes (Signed)
SW has called transportation for patient. SW made nurse aware. Patient is going to [facility]. SW spoke with admissions who confirms that the pt is welcomed to come today.  Nurse Report Number: [id number] [place], MSW [phone]

## 2015-12-11 NOTE — Progress Notes (Signed)
Physical Therapy Treatment Patient Details Name: [name] MRN: [id number] DOB: June 08, 1940 Today's Date: 12/11/2015    History of Present Illness Patient is a 75 y/o male s/p R TKA.  PMH positive for diverticulitis, bipolar, venous insufficiency, neuropathy, HTN, CVA, OSA, CKD.    PT Comments    Pt performed gait and therapeutic exercise.  Remains to progress slowly and present with balance deficits.  Pt educated to continue HEP at rehab.  Plans for d/c later this pm.   Follow Up Recommendations        Equipment Recommendations  None recommended by PT    Recommendations for Other Services       Precautions / Restrictions Precautions Precautions: Fall;Knee Required Braces or Orthoses: Knee Immobilizer - Right Knee Immobilizer - Right: On when out of bed or walking Restrictions Weight Bearing Restrictions: Yes RLE Weight Bearing: Weight bearing as tolerated    Mobility  Bed Mobility Overal bed mobility: Needs Assistance Bed Mobility: Sit to Supine     Supine to sit: Supervision Sit to supine: Supervision   General bed mobility comments: Pt required increased time but able to advance back to bed without assistance.    Transfers Overall transfer level: Needs assistance Equipment used: Rolling walker (2 wheeled) Transfers: Sit to/from Stand Sit to Stand: Min assist         General transfer comment: Cues for hand placement and boost to achieve standing.  Posterior lean noted initially in standing.  Pt performed from chair and BSC, posterior lean remains with LOB x3 during toileting.    Ambulation/Gait Ambulation/Gait assistance: Min assist Ambulation Distance (Feet): 150 Feet Assistive device: Rolling walker (2 wheeled) Gait Pattern/deviations: Step-through pattern;Trunk flexed;Antalgic   Gait velocity interpretation: Below normal speed for age/gender General Gait Details: Pt leaning posteriorly initially but as gait progress improved.  Cues for forward  gaze and upper trunk control, cues to keep RW close in proxmity.  pt observed pushing RW out to far.     Stairs            Wheelchair Mobility    Modified Rankin (Stroke Patients Only)       Balance Overall balance assessment: Needs assistance   Sitting balance-Leahy Scale: Fair       Standing balance-Leahy Scale: Poor                      Cognition Arousal/Alertness: Awake/alert Behavior During Therapy: WFL for tasks assessed/performed Overall Cognitive Status: Within Functional Limits for tasks assessed                      Exercises Total Joint Exercises Ankle Circles/Pumps: AROM;Both;10 reps;Supine Quad Sets: AROM;Right;10 reps;Supine Towel Squeeze: AROM;Both;10 reps;Supine Short Arc Quad: AROM;Right;10 reps;Supine Heel Slides: AROM;Right;10 reps;Supine Hip ABduction/ADduction: AROM;Right;10 reps;Supine Straight Leg Raises: Right;10 reps;Supine;AAROM Goniometric ROM: 75 degrees R knee flexion.    General Comments        Pertinent Vitals/Pain Pain Assessment: 0-10 Pain Score: 6  Pain Location: R knee Pain Descriptors / Indicators: Grimacing;Guarding Pain Intervention(s): Monitored during session    Home Living                      Prior Function            PT Goals (current goals can now be found in the care plan section) Acute Rehab PT Goals Patient Stated Goal: To go to rehab Potential to Achieve Goals: Good Progress towards PT goals:  Progressing toward goals    Frequency    7X/week      PT Plan Current plan remains appropriate    Co-evaluation             End of Session Equipment Utilized During Treatment: Gait belt Activity Tolerance: Patient tolerated treatment well       Time: 2244-9753 PT Time Calculation (min) (ACUTE ONLY): 31 min  Charges:  $Gait Training: 8-22 mins $Therapeutic Exercise: 8-22 mins                    G Codes:      Cristela Blue 2015/12/18, 5:57 PM  Governor Rooks,  PTA pager [phone]

## 2015-12-11 NOTE — Discharge Summary (Signed)
Patient ID: [name] MRN: [id number] DOB/AGE: 1941-01-24 75 y.o.  Admit date: 12/08/2015 Discharge date: 12/11/2015  Admission Diagnoses:  Principal Problem:   Primary osteoarthritis of right knee   Discharge Diagnoses:  Same  Past Medical History:  Diagnosis Date  . Arthritis    back  . Back pain    reason unknown  . Benign prostatic hyperplasia (BPH) with urinary urge incontinence    sees Dr. Jeffie Pollock   . Bipolar 1 disorder [facility])    sees Dr. Norma Fredrickson  . CKD (chronic kidney disease)    (Dr. Erling Cruz)  . Depression    takes Parnate daily  . Headache(784.0)    occasionally  . History of colon polyps   . Joint pain   . Joint swelling   . Osteoporosis    takes Drisdol weekly and Caltrate daily  . Peripheral edema    takes Lasix daily   . Sleep apnea    wears c-pap  . Swelling    right hand    Surgeries: Procedure(s):Right TOTAL KNEE ARTHROPLASTY on 12/08/2015  Discharged Condition: Improved  Hospital Course: [name] is an 75 y.o. male who was admitted 12/08/2015 for operative treatment ofPrimary osteoarthritis of right knee. Patient has severe unremitting pain that affects sleep, daily activities, and work/hobbies. After pre-op clearance the patient was taken to the operating room on 12/08/2015 and underwent  Procedure(s):RIGHT TOTAL KNEE ARTHROPLASTY.    Patient was given perioperative antibiotics: Anti-infectives    Start     Dose/Rate Route Frequency Ordered Stop   12/08/15 2100  ceFAZolin (ANCEF) IVPB 2g/100 mL premix     2 g 200 mL/hr over 30 Minutes Intravenous Every 6 hours 12/08/15 2021 12/09/15 0501   12/08/15 1142  ceFAZolin (ANCEF) 2-4 GM/100ML-% IVPB    Comments:  Vira Agar, Beth   : cabinet override      12/08/15 1142 12/08/15 1425   12/08/15 1133  ceFAZolin (ANCEF) IVPB 2g/100 mL premix     2 g 200 mL/hr over 30 Minutes Intravenous On call to O.R. 12/08/15 1133 12/08/15 1425       Patient was given sequential compression  devices, early ambulation, and chemoprophylaxis to prevent DVT.  Patient benefited maximally from hospital stay and there were no complications.    Recent vital signs: Patient Vitals for the past 24 hrs:  BP Temp Temp src Pulse Resp SpO2  12/11/15 0545 (!) 155/76 97.5 F (36.4 C) Oral 80 16 92 %  12/10/15 2000 (!) 156/79 97.5 F (36.4 C) Oral 83 16 91 %  12/10/15 1500 (!) 168/78 97.4 F (36.3 C) Oral 95 18 91 %     Recent laboratory studies:  Recent Labs  12/10/15 0444 12/11/15 0514  WBC 16.5* 17.8*  HGB 11.6* 11.3*  HCT 36.7* 34.9*  PLT 178 199  NA 140 141  K 4.8 4.2  CL 112* 113*  CO2 22 21*  BUN 35* 40*  CREATININE 1.90* 1.74*  GLUCOSE 134* 117*  CALCIUM 9.1 9.2     Discharge Medications:     Medication List    STOP taking these medications   acetaminophen 500 MG tablet Commonly known as:  TYLENOL     TAKE these medications   ARIPiprazole 5 MG tablet Commonly known as:  ABILIFY Take 1 tablet (5 mg total) by mouth daily. What changed:  how much to take   aspirin EC 325 MG tablet Take 1 tablet (325 mg total) by mouth 2 (two) times daily after a meal.  Take x 1 month post op to decrease risk of blood clots.   CALTRATE 600 PLUS-VIT D PO Take 1 tablet by mouth daily.   L-METHYLFOLATE FORTE 15-90.314 MG Caps Take 1 capsule by mouth daily.   oxyCODONE-acetaminophen 5-325 MG tablet Commonly known as:  PERCOCET/ROXICET Take 1-2 tablets by mouth every 4 (four) hours as needed for severe pain.   PA VITAMIN D-3 GUMMY PO Take 2 tablets by mouth 2 (two) times daily.   tiZANidine 2 MG tablet Commonly known as:  ZANAFLEX Take 1 tablet (2 mg total) by mouth every 8 (eight) hours as needed for muscle spasms.   tranylcypromine 10 MG tablet Commonly known as:  PARNATE Take 30 mg by mouth 2 (two) times daily.       Diagnostic Studies: Dg Chest 2 View  Result Date: 11/27/2015 CLINICAL DATA:  Arthroplasty. EXAM: CHEST  2 VIEW COMPARISON:  05/21/2013. FINDINGS:  Mediastinum hilar structures are normal. Low lung volumes with mild bibasilar atelectasis and/or scarring. No pleural effusion or pneumothorax. A calcified nodule left upper lobe consistent granuloma again noted. Stable mild cardiomegaly. No pulmonary venous congestion. Right shoulder replacement . IMPRESSION: No acute cardiopulmonary disease. Stable mild cardiomegaly. Stable bibasilar subsegmental atelectasis and/or scarring. Electronically Signed   By: Marcello Moores  Register   On: 11/27/2015 16:34    Disposition: [place]  Discharge Instructions    CPM    Complete by:  As directed    Continuous passive motion machine (CPM):      Use the CPM from 0 to 70 for 8 hours per day.      You may increase by 5-10 per day.  You may break it up into 2 or 3 sessions per day.      Use CPM for 1-2 weeks or until you are told to stop.   Call MD / Call 911    Complete by:  As directed    If you experience chest pain or shortness of breath, CALL 911 and be transported to the hospital emergency room.  If you develope a fever above 101 F, pus (white drainage) or increased drainage or redness at the wound, or calf pain, call your surgeon's office.   Constipation Prevention    Complete by:  As directed    Drink plenty of fluids.  Prune juice may be helpful.  You may use a stool softener, such as Colace (over the counter) 100 mg twice a day.  Use MiraLax (over the counter) for constipation as needed.   Diet general    Complete by:  As directed    Do not put a pillow under the knee. Place it under the heel.    Complete by:  As directed    Increase activity slowly as tolerated    Complete by:  As directed    Weight bearing as tolerated    Complete by:  As directed    Laterality:  right   Extremity:  Lower   Weight bearing as tolerated    Complete by:  As directed    Laterality:  right   Extremity:  Lower      Follow-up Information    GRAVES,JOHN L, MD. Schedule an appointment as soon as  possible for a visit in 2 weeks.   Specialty:  Orthopedic Surgery Contact information: [place] [address] [phone]            Signed: Erlene Senters 12/11/2015, 7:10 AM

## 2015-12-11 NOTE — Progress Notes (Signed)
Subjective: 3 Days Post-Op Procedure(s) (LRB): TOTAL KNEE ARTHROPLASTY (Right) Patient reports pain as mild.    Objective: Vital signs in last 24 hours: Temp:  [97.4 F (36.3 C)-97.5 F (36.4 C)] 97.5 F (36.4 C) (09/21 0545) Pulse Rate:  [80-95] 80 (09/21 0545) Resp:  [16-18] 16 (09/21 0545) BP: (155-168)/(76-79) 155/76 (09/21 0545) SpO2:  [91 %-92 %] 92 % (09/21 0545)  Intake/Output from previous day: 09/20 0701 - 09/21 0700 In: 720 [P.O.:720] Out: 2050 [Urine:2050] Intake/Output this shift: No intake/output data recorded.   Recent Labs  12/09/15 0416 12/10/15 0444 12/11/15 0514  HGB 12.5* 11.6* 11.3*    Recent Labs  12/10/15 0444 12/11/15 0514  WBC 16.5* 17.8*  RBC 3.77* 3.63*  HCT 36.7* 34.9*  PLT 178 199    Recent Labs  12/10/15 0444 12/11/15 0514  NA 140 141  K 4.8 4.2  CL 112* 113*  CO2 22 21*  BUN 35* 40*  CREATININE 1.90* 1.74*  GLUCOSE 134* 117*  CALCIUM 9.1 9.2   No results for input(s): LABPT, INR in the last 72 hours.  Neurologically intact ABD soft Neurovascular intact Sensation intact distally Intact pulses distally Dorsiflexion/Plantar flexion intact No cellulitis present Compartment soft  Assessment/Plan: 3 Days Post-Op Procedure(s) (LRB): TOTAL KNEE ARTHROPLASTY (Right) Advance diet Up with therapy D/C IV fluids Discharge to SNF  [name] L 12/11/2015, 7:49 AM

## 2015-12-11 NOTE — Progress Notes (Signed)
Orthopedic Tech Progress Note Patient Details:  [name] [birth year] [id number]  Patient ID: [name], male   DOB: 04-22-1940, 75 y.o.   MRN: [id number] Applied cpm 0-80  Karolee Stamps 12/11/2015, 6:30 AM

## 2015-12-11 NOTE — Progress Notes (Signed)
Report called to Karmen Stabs at [phone].  All questions answered and denies any further questions.

## 2015-12-12 DIAGNOSIS — D72829 Elevated white blood cell count, unspecified: Secondary | ICD-10-CM | POA: Diagnosis not present

## 2015-12-12 DIAGNOSIS — N189 Chronic kidney disease, unspecified: Secondary | ICD-10-CM | POA: Diagnosis not present

## 2015-12-12 DIAGNOSIS — Z96659 Presence of unspecified artificial knee joint: Secondary | ICD-10-CM | POA: Diagnosis not present

## 2015-12-15 ENCOUNTER — Other Ambulatory Visit: Payer: Self-pay | Admitting: *Deleted

## 2015-12-15 NOTE — Patient Outreach (Signed)
Called and spoke to Lucina Mellow at [facility] office to notify of patient's recent discharge from [facility] to SNF ([facility]). Reminded Sharyn Lull that if patient will have future needs arising after SNF discharge not to hesitate to refer patient to [facility].

## 2015-12-21 DIAGNOSIS — R600 Localized edema: Secondary | ICD-10-CM | POA: Diagnosis not present

## 2015-12-26 DIAGNOSIS — M1711 Unilateral primary osteoarthritis, right knee: Secondary | ICD-10-CM | POA: Diagnosis not present

## 2015-12-29 DIAGNOSIS — N189 Chronic kidney disease, unspecified: Secondary | ICD-10-CM | POA: Diagnosis not present

## 2015-12-29 DIAGNOSIS — Z96659 Presence of unspecified artificial knee joint: Secondary | ICD-10-CM | POA: Diagnosis not present

## 2015-12-29 DIAGNOSIS — D72829 Elevated white blood cell count, unspecified: Secondary | ICD-10-CM | POA: Diagnosis not present

## 2016-01-01 DIAGNOSIS — G4733 Obstructive sleep apnea (adult) (pediatric): Secondary | ICD-10-CM | POA: Diagnosis not present

## 2016-01-01 DIAGNOSIS — M810 Age-related osteoporosis without current pathological fracture: Secondary | ICD-10-CM | POA: Diagnosis not present

## 2016-01-01 DIAGNOSIS — I129 Hypertensive chronic kidney disease with stage 1 through stage 4 chronic kidney disease, or unspecified chronic kidney disease: Secondary | ICD-10-CM | POA: Diagnosis not present

## 2016-01-01 DIAGNOSIS — Z79891 Long term (current) use of opiate analgesic: Secondary | ICD-10-CM | POA: Diagnosis not present

## 2016-01-01 DIAGNOSIS — G629 Polyneuropathy, unspecified: Secondary | ICD-10-CM | POA: Diagnosis not present

## 2016-01-01 DIAGNOSIS — N183 Chronic kidney disease, stage 3 (moderate): Secondary | ICD-10-CM | POA: Diagnosis not present

## 2016-01-01 DIAGNOSIS — I872 Venous insufficiency (chronic) (peripheral): Secondary | ICD-10-CM | POA: Diagnosis not present

## 2016-01-01 DIAGNOSIS — Z8673 Personal history of transient ischemic attack (TIA), and cerebral infarction without residual deficits: Secondary | ICD-10-CM | POA: Diagnosis not present

## 2016-01-01 DIAGNOSIS — Z471 Aftercare following joint replacement surgery: Secondary | ICD-10-CM | POA: Diagnosis not present

## 2016-01-01 DIAGNOSIS — Z7982 Long term (current) use of aspirin: Secondary | ICD-10-CM | POA: Diagnosis not present

## 2016-01-01 DIAGNOSIS — Z96651 Presence of right artificial knee joint: Secondary | ICD-10-CM | POA: Diagnosis not present

## 2016-01-02 ENCOUNTER — Ambulatory Visit (INDEPENDENT_AMBULATORY_CARE_PROVIDER_SITE_OTHER): Payer: Medicare Other | Admitting: Internal Medicine

## 2016-01-02 ENCOUNTER — Ambulatory Visit (HOSPITAL_COMMUNITY)
Admission: RE | Admit: 2016-01-02 | Discharge: 2016-01-02 | Disposition: A | Discharge status: Home / Self Care | Payer: Medicare Other | Source: Ambulatory Visit | Attending: Cardiology | Admitting: Cardiology

## 2016-01-02 ENCOUNTER — Encounter: Payer: Self-pay | Admitting: Internal Medicine

## 2016-01-02 ENCOUNTER — Other Ambulatory Visit: Payer: Self-pay | Admitting: Internal Medicine

## 2016-01-02 VITALS — BP 116/72 | HR 94 | Temp 97.8°F | Ht 66.0 in | Wt 198.6 lb

## 2016-01-02 DIAGNOSIS — N183 Chronic kidney disease, stage 3 unspecified: Secondary | ICD-10-CM

## 2016-01-02 DIAGNOSIS — M79606 Pain in leg, unspecified: Secondary | ICD-10-CM | POA: Diagnosis not present

## 2016-01-02 DIAGNOSIS — Z96651 Presence of right artificial knee joint: Secondary | ICD-10-CM | POA: Diagnosis not present

## 2016-01-02 DIAGNOSIS — M7989 Other specified soft tissue disorders: Secondary | ICD-10-CM | POA: Diagnosis not present

## 2016-01-02 DIAGNOSIS — R600 Localized edema: Secondary | ICD-10-CM

## 2016-01-02 DIAGNOSIS — N189 Chronic kidney disease, unspecified: Secondary | ICD-10-CM | POA: Insufficient documentation

## 2016-01-02 DIAGNOSIS — I129 Hypertensive chronic kidney disease with stage 1 through stage 4 chronic kidney disease, or unspecified chronic kidney disease: Secondary | ICD-10-CM | POA: Diagnosis not present

## 2016-01-02 LAB — COMPREHENSIVE METABOLIC PANEL
ALBUMIN: 3.6 g/dL (ref 3.5–5.2)
ALK PHOS: 73 U/L (ref 39–117)
ALT: 8 U/L (ref 0–53)
AST: 12 U/L (ref 0–37)
BILIRUBIN TOTAL: 0.4 mg/dL (ref 0.2–1.2)
BUN: 38 mg/dL — ABNORMAL HIGH (ref 6–23)
CO2: 29 mEq/L (ref 19–32)
Calcium: 9.5 mg/dL (ref 8.4–10.5)
Chloride: 104 mEq/L (ref 96–112)
Creatinine, Ser: 2.46 mg/dL — ABNORMAL HIGH (ref 0.40–1.50)
GFR: 27.33 mL/min — AB (ref >60.00)
GLUCOSE: 105 mg/dL — AB (ref 70–99)
POTASSIUM: 4.7 meq/L (ref 3.5–5.1)
Sodium: 141 mEq/L (ref 135–145)
TOTAL PROTEIN: 5.9 g/dL — AB (ref 6.0–8.3)

## 2016-01-02 LAB — CBC WITH DIFFERENTIAL/PLATELET
BASOS ABS: 0.1 10*3/uL (ref 0.0–0.1)
Basophils Relative: 0.9 % (ref 0.0–3.0)
EOS PCT: 10.2 % — AB (ref 0.0–5.0)
Eosinophils Absolute: 0.9 10*3/uL — ABNORMAL HIGH (ref 0.0–0.7)
HCT: 32.3 % — ABNORMAL LOW (ref 39.0–52.0)
HEMOGLOBIN: 10.7 g/dL — AB (ref 13.0–17.0)
LYMPHS ABS: 2 10*3/uL (ref 0.7–4.0)
Lymphocytes Relative: 22.4 % (ref 12.0–46.0)
MCHC: 33.1 g/dL (ref 30.0–36.0)
MCV: 93.8 fl (ref 78.0–100.0)
MONO ABS: 1 10*3/uL (ref 0.1–1.0)
MONOS PCT: 11.6 % (ref 3.0–12.0)
NEUTROS PCT: 54.9 % (ref 43.0–77.0)
Neutro Abs: 4.9 10*3/uL (ref 1.4–7.7)
Platelets: 381 10*3/uL (ref 150.0–400.0)
RBC: 3.44 Mil/uL — AB (ref 4.22–5.81)
RDW: 12.9 % (ref 11.5–15.5)
WBC: 9 10*3/uL (ref 4.0–10.5)

## 2016-01-02 NOTE — Patient Instructions (Signed)
We are checking your kidney function today. Is possible that your swelling is just from fluid retention after the surgery and a Lasix her diuretic may help you. However your right leg is more swollen than your left and we are checking to make sure you don't have a blood clot and don't need to be on a blood thinner. We'll let you know the labs and results when we get them back. Please make a follow-up appointment with Dr. Sarajane Jews in a week. And keep the appointment with your orthopedics.

## 2016-01-02 NOTE — Progress Notes (Signed)
Pre visit review using our clinic review tool, if applicable. No additional management support is needed unless otherwise documented below in the visit note.  Chief Complaint  Patient presents with  . Bilateral Leg Swelling and Pain    HPI: [name] 75 y.o.  sda  pcp   Not in office today . Patient complains of leg swelling. He had a right total knee replacement Dr. Berenice Primas in September and is out of rehabilitation. To begin physical therapy at home. He made an appointment for today. States that he's had swelling in his legs for over a month. Does not complain of right greater than left. He states his left leg hurts more from his arthritis he is on pain medicines. No bleeding fever chest pain shortness of breath it is over baseline. He states his lungs aren't that good anyway. He states(told to try to take Lasix in rehabilitation but he didn't want to take it because of his kidney disease. He has a follow-up with his orthopedics next week  Had knee surgery  9 18 per dr graves   ROS: See pertinent positives and negatives per HPI.  Past Medical History:  Diagnosis Date  . Arthritis    back  . Back pain    reason unknown  . Benign prostatic hyperplasia (BPH) with urinary urge incontinence    sees Dr. Jeffie Pollock   . Bipolar 1 disorder [facility])    sees Dr. Norma Fredrickson  . CKD (chronic kidney disease)    (Dr. Erling Cruz)  . Depression    takes Parnate daily  . Headache(784.0)    occasionally  . History of colon polyps   . Joint pain   . Joint swelling   . Osteoporosis    takes Drisdol weekly and Caltrate daily  . Peripheral edema    takes Lasix daily   . Sleep apnea    wears c-pap  . Swelling    right hand    Family History  Problem Relation Age of Onset  . Cancer Mother   . Stroke Father   . Colon cancer Neg Hx   . Esophageal cancer Neg Hx   . Rectal cancer Neg Hx   . Stomach cancer Neg Hx     Social History   Social History  . Marital status: Divorced   Spouse name: N/A  . Number of children: N/A  . Years of education: N/A   Social History Main Topics  . Smoking status: Never Smoker  . Smokeless tobacco: Never Used  . Alcohol use No  . Drug use: No  . Sexual activity: Yes   Other Topics Concern  . None   Social History Narrative  . None    Outpatient Medications Prior to Visit  Medication Sig Dispense Refill  . ARIPiprazole (ABILIFY) 5 MG tablet Take 1 tablet (5 mg total) by mouth daily. (Patient taking differently: Take 2.5 mg by mouth daily. ) 90 tablet 3  . aspirin EC 325 MG tablet Take 1 tablet (325 mg total) by mouth 2 (two) times daily after a meal. Take x 1 month post op to decrease risk of blood clots. 60 tablet 0  . Calcium-Vitamin D (CALTRATE 600 PLUS-VIT D PO) Take 1 tablet by mouth daily.     . Cholecalciferol (PA VITAMIN D-3 GUMMY PO) Take 2 tablets by mouth 2 (two) times daily.    Marland Kitchen L-Methylfolate-Algae (L-METHYLFOLATE FORTE) 15-90.314 MG CAPS Take 1 capsule by mouth daily.  0  . oxyCODONE-acetaminophen (PERCOCET/ROXICET) 5-325 MG  tablet Take 1-2 tablets by mouth every 4 (four) hours as needed for severe pain. 60 tablet 0  . tiZANidine (ZANAFLEX) 2 MG tablet Take 1 tablet (2 mg total) by mouth every 8 (eight) hours as needed for muscle spasms. 50 tablet 0  . tranylcypromine (PARNATE) 10 MG tablet Take 30 mg by mouth 2 (two) times daily.      No facility-administered medications prior to visit.      EXAM:  BP 116/72 (BP Location: Right Arm, Patient Position: Sitting, Cuff Size: Normal)   Pulse 94   Temp 97.8 F (36.6 C) (Oral)   Ht 5\' 6"  (1.676 m)   Wt 198 lb 9.6 oz (90.1 kg)   SpO2 97%   BMI 32.05 kg/m   Body mass index is 32.05 kg/m.  GENERAL: vitals reviewed and listed above, alert, oriented, appears well hydrated and in no acute distress HEENT: atraumatic, conjunctiva  clear, no obvious abnormalities on inspection of external nose and ears NECK: no obvious masses on inspection palpation  LUNGS:  clear to auscultation bilaterally, no wheezes, rales or rhonchi,  CV: HRRR, no clubbing cyanosis rihg tle knee incision no warnth or redness   Distal 2-3+ edema  No rednesss left  Leg  1+ edema   Both  Legs have chronic skin edema   nl cap refill  MS: moves all extremities  Using walker  Independent    Wt Readings from Last 3 Encounters:  01/02/16 198 lb 9.6 oz (90.1 kg)  12/08/15 198 lb 9.6 oz (90.1 kg)  11/27/15 198 lb 9.6 oz (90.1 kg)   BP Readings from Last 3 Encounters:  01/02/16 116/72  12/11/15 (!) 155/76  11/27/15 (!) 143/82     ASSESSMENT AND PLAN:  Discussed the following assessment and plan:  Bilateral edema of lower extremity assymmetric righ more than left  - sp TKA september  post rehabe - Plan: CMP, CBC with Differential/Platelet, CANCELED: VAS [place] LOWER EXTREMITY VENOUS (DVT)  S/P total knee arthroplasty, right  Chronic kidney disease (CKD), stage III (moderate) - Plan: CMP, CBC with Differential/Platelet, CANCELED: VAS [place] LOWER EXTREMITY VENOUS (DVT) Questioning patient states he has had this problem for months but yet called for the appointment today. He states that there is pain more in the left leg it isn't swollen as the right and that he doesn't see a difference. That the rehabilitation doctor wanted to give him Lasix but he was concerned because his kidneys. Nevertheless it is Friday will get lab work in order a right lower extremity Doppler to rule out DVT. Plan to follow-up with Dr. Sarajane Jews next week and he has an appointment with his orthopedic next week. -Patient advised to return or notify health care team  if symptoms worsen ,persist or new concerns arise.  Patient Instructions  We are checking your kidney function today. Is possible that your swelling is just from fluid retention after the surgery and a Lasix her diuretic may help you. However your right leg is more swollen than your left and we are checking to make sure you don't have a blood clot and don't  need to be on a blood thinner. We'll let you know the labs and results when we get them back. Please make a follow-up appointment with Dr. Sarajane Jews in a week. And keep the appointment with your orthopedics.    Standley Brooking. Panosh M.D.  Doppler was negative for DVT. Below her follow-up labs creatinine is elevated for him increase his Lasix to twice a  day for 4 days. Appointment with Dr. Sarajane Jews. Lab Results  Component Value Date   WBC 9.0 01/02/2016   HGB 10.7 (L) 01/02/2016   HCT 32.3 (L) 01/02/2016   PLT 381.0 01/02/2016   GLUCOSE 105 (H) 01/02/2016   CHOL 152 05/07/2015   TRIG 164.0 (H) 05/07/2015   HDL 65.20 05/07/2015   LDLCALC 54 05/07/2015   ALT 8 01/02/2016   AST 12 01/02/2016   NA 141 01/02/2016   K 4.7 01/02/2016   CL 104 01/02/2016   CREATININE 2.46 (H) 01/02/2016   BUN 38 (H) 01/02/2016   CO2 29 01/02/2016   TSH 1.92 05/07/2015   PSA 0.62 05/07/2015   INR 1.02 11/27/2015   HGBA1C 5.7 10/02/2014

## 2016-01-05 DIAGNOSIS — Z8673 Personal history of transient ischemic attack (TIA), and cerebral infarction without residual deficits: Secondary | ICD-10-CM | POA: Diagnosis not present

## 2016-01-05 DIAGNOSIS — G4733 Obstructive sleep apnea (adult) (pediatric): Secondary | ICD-10-CM | POA: Diagnosis not present

## 2016-01-05 DIAGNOSIS — Z79891 Long term (current) use of opiate analgesic: Secondary | ICD-10-CM | POA: Diagnosis not present

## 2016-01-05 DIAGNOSIS — M810 Age-related osteoporosis without current pathological fracture: Secondary | ICD-10-CM | POA: Diagnosis not present

## 2016-01-05 DIAGNOSIS — Z471 Aftercare following joint replacement surgery: Secondary | ICD-10-CM | POA: Diagnosis not present

## 2016-01-05 DIAGNOSIS — I872 Venous insufficiency (chronic) (peripheral): Secondary | ICD-10-CM | POA: Diagnosis not present

## 2016-01-05 DIAGNOSIS — N183 Chronic kidney disease, stage 3 (moderate): Secondary | ICD-10-CM | POA: Diagnosis not present

## 2016-01-05 DIAGNOSIS — I129 Hypertensive chronic kidney disease with stage 1 through stage 4 chronic kidney disease, or unspecified chronic kidney disease: Secondary | ICD-10-CM | POA: Diagnosis not present

## 2016-01-05 DIAGNOSIS — Z7982 Long term (current) use of aspirin: Secondary | ICD-10-CM | POA: Diagnosis not present

## 2016-01-05 DIAGNOSIS — Z96651 Presence of right artificial knee joint: Secondary | ICD-10-CM | POA: Diagnosis not present

## 2016-01-05 DIAGNOSIS — G629 Polyneuropathy, unspecified: Secondary | ICD-10-CM | POA: Diagnosis not present

## 2016-01-06 ENCOUNTER — Ambulatory Visit: Payer: Medicare Other | Admitting: Family Medicine

## 2016-01-06 DIAGNOSIS — M1711 Unilateral primary osteoarthritis, right knee: Secondary | ICD-10-CM | POA: Diagnosis not present

## 2016-01-07 DIAGNOSIS — Z471 Aftercare following joint replacement surgery: Secondary | ICD-10-CM | POA: Diagnosis not present

## 2016-01-07 DIAGNOSIS — I872 Venous insufficiency (chronic) (peripheral): Secondary | ICD-10-CM | POA: Diagnosis not present

## 2016-01-07 DIAGNOSIS — I129 Hypertensive chronic kidney disease with stage 1 through stage 4 chronic kidney disease, or unspecified chronic kidney disease: Secondary | ICD-10-CM | POA: Diagnosis not present

## 2016-01-07 DIAGNOSIS — Z8673 Personal history of transient ischemic attack (TIA), and cerebral infarction without residual deficits: Secondary | ICD-10-CM | POA: Diagnosis not present

## 2016-01-07 DIAGNOSIS — Z7982 Long term (current) use of aspirin: Secondary | ICD-10-CM | POA: Diagnosis not present

## 2016-01-07 DIAGNOSIS — Z79891 Long term (current) use of opiate analgesic: Secondary | ICD-10-CM | POA: Diagnosis not present

## 2016-01-07 DIAGNOSIS — Z96651 Presence of right artificial knee joint: Secondary | ICD-10-CM | POA: Diagnosis not present

## 2016-01-07 DIAGNOSIS — M810 Age-related osteoporosis without current pathological fracture: Secondary | ICD-10-CM | POA: Diagnosis not present

## 2016-01-07 DIAGNOSIS — G4733 Obstructive sleep apnea (adult) (pediatric): Secondary | ICD-10-CM | POA: Diagnosis not present

## 2016-01-07 DIAGNOSIS — N183 Chronic kidney disease, stage 3 (moderate): Secondary | ICD-10-CM | POA: Diagnosis not present

## 2016-01-07 DIAGNOSIS — G629 Polyneuropathy, unspecified: Secondary | ICD-10-CM | POA: Diagnosis not present

## 2016-01-08 DIAGNOSIS — G4733 Obstructive sleep apnea (adult) (pediatric): Secondary | ICD-10-CM | POA: Diagnosis not present

## 2016-01-08 DIAGNOSIS — I872 Venous insufficiency (chronic) (peripheral): Secondary | ICD-10-CM | POA: Diagnosis not present

## 2016-01-08 DIAGNOSIS — Z79891 Long term (current) use of opiate analgesic: Secondary | ICD-10-CM | POA: Diagnosis not present

## 2016-01-08 DIAGNOSIS — Z7982 Long term (current) use of aspirin: Secondary | ICD-10-CM | POA: Diagnosis not present

## 2016-01-08 DIAGNOSIS — Z96651 Presence of right artificial knee joint: Secondary | ICD-10-CM | POA: Diagnosis not present

## 2016-01-08 DIAGNOSIS — N183 Chronic kidney disease, stage 3 (moderate): Secondary | ICD-10-CM | POA: Diagnosis not present

## 2016-01-08 DIAGNOSIS — Z471 Aftercare following joint replacement surgery: Secondary | ICD-10-CM | POA: Diagnosis not present

## 2016-01-08 DIAGNOSIS — M810 Age-related osteoporosis without current pathological fracture: Secondary | ICD-10-CM | POA: Diagnosis not present

## 2016-01-08 DIAGNOSIS — I129 Hypertensive chronic kidney disease with stage 1 through stage 4 chronic kidney disease, or unspecified chronic kidney disease: Secondary | ICD-10-CM | POA: Diagnosis not present

## 2016-01-08 DIAGNOSIS — G629 Polyneuropathy, unspecified: Secondary | ICD-10-CM | POA: Diagnosis not present

## 2016-01-08 DIAGNOSIS — Z8673 Personal history of transient ischemic attack (TIA), and cerebral infarction without residual deficits: Secondary | ICD-10-CM | POA: Diagnosis not present

## 2016-01-13 ENCOUNTER — Encounter: Payer: Self-pay | Admitting: Family Medicine

## 2016-01-13 ENCOUNTER — Ambulatory Visit (INDEPENDENT_AMBULATORY_CARE_PROVIDER_SITE_OTHER): Payer: Medicare Other | Admitting: Family Medicine

## 2016-01-13 VITALS — BP 135/73 | HR 102 | Temp 97.8°F | Ht 66.0 in | Wt 197.0 lb

## 2016-01-13 DIAGNOSIS — N183 Chronic kidney disease, stage 3 unspecified: Secondary | ICD-10-CM

## 2016-01-13 DIAGNOSIS — I10 Essential (primary) hypertension: Secondary | ICD-10-CM

## 2016-01-13 DIAGNOSIS — I129 Hypertensive chronic kidney disease with stage 1 through stage 4 chronic kidney disease, or unspecified chronic kidney disease: Secondary | ICD-10-CM | POA: Diagnosis not present

## 2016-01-13 DIAGNOSIS — I872 Venous insufficiency (chronic) (peripheral): Secondary | ICD-10-CM | POA: Diagnosis not present

## 2016-01-13 DIAGNOSIS — G629 Polyneuropathy, unspecified: Secondary | ICD-10-CM | POA: Diagnosis not present

## 2016-01-13 DIAGNOSIS — G4733 Obstructive sleep apnea (adult) (pediatric): Secondary | ICD-10-CM | POA: Diagnosis not present

## 2016-01-13 DIAGNOSIS — Z96651 Presence of right artificial knee joint: Secondary | ICD-10-CM | POA: Diagnosis not present

## 2016-01-13 DIAGNOSIS — M810 Age-related osteoporosis without current pathological fracture: Secondary | ICD-10-CM | POA: Diagnosis not present

## 2016-01-13 DIAGNOSIS — Z471 Aftercare following joint replacement surgery: Secondary | ICD-10-CM | POA: Diagnosis not present

## 2016-01-13 DIAGNOSIS — M1711 Unilateral primary osteoarthritis, right knee: Secondary | ICD-10-CM

## 2016-01-13 DIAGNOSIS — Z23 Encounter for immunization: Secondary | ICD-10-CM | POA: Diagnosis not present

## 2016-01-13 DIAGNOSIS — K5900 Constipation, unspecified: Secondary | ICD-10-CM

## 2016-01-13 DIAGNOSIS — M25579 Pain in unspecified ankle and joints of unspecified foot: Secondary | ICD-10-CM

## 2016-01-13 DIAGNOSIS — Z79891 Long term (current) use of opiate analgesic: Secondary | ICD-10-CM | POA: Diagnosis not present

## 2016-01-13 DIAGNOSIS — R600 Localized edema: Secondary | ICD-10-CM

## 2016-01-13 DIAGNOSIS — K5909 Other constipation: Secondary | ICD-10-CM | POA: Insufficient documentation

## 2016-01-13 DIAGNOSIS — Z7982 Long term (current) use of aspirin: Secondary | ICD-10-CM | POA: Diagnosis not present

## 2016-01-13 DIAGNOSIS — Z8673 Personal history of transient ischemic attack (TIA), and cerebral infarction without residual deficits: Secondary | ICD-10-CM | POA: Diagnosis not present

## 2016-01-13 MED ORDER — FUROSEMIDE 40 MG PO TABS: 40.0000 mg | ORAL_TABLET | Freq: Every day | ORAL | 11 refills | Status: DC

## 2016-01-13 MED ORDER — TRAMADOL HCL 50 MG PO TABS: ORAL_TABLET | ORAL | 2 refills | Status: DC

## 2016-01-13 NOTE — Addendum Note (Signed)
Addended by: Aggie Hacker A on: 01/13/2016 09:38 AM   Modules accepted: Orders

## 2016-01-13 NOTE — Progress Notes (Signed)
Pre visit review using our clinic review tool, if applicable. No additional management support is needed unless otherwise documented below in the visit note. 

## 2016-01-13 NOTE — Progress Notes (Signed)
   Subjective:    Patient ID: [name], male    DOB: 11-25-1940, 75 y.o.   MRN: [id number]  HPI Here to follow up on several issues. He ad a right total knee arthroplasty on 12-08-15 and this went well. He then went into a rehab facility until he was [facility] home on 12-29-15. During this time however he had increased swelling in both legs (he has had some leg edema for several years). No chest pain or SOB. He saw Dr. Regis Bill for this on 01-02-16 and she was able to rule out any DVT with a negative doppler scan of both legs. She had him double up on his Lasix to take 40 mg BID for 4 days, and this helped the swelling. He is now back to taking this once a day. His labs on 01-02-16 showed his creatinine to be up slightly above his baseline at 2.46. He will be getting outpatient PT this week and then will be done. His main concern today is severe arthritic pain in his feet. He took Percocet after his surgery but he became severely constipated on this so he stopped. He has regained normal BMs now by taking stool softeners. He is taking Tylenol but this does not help his pain at all. He is unable to take NSAIDs due to his kidney disease.    Review of Systems  Constitutional: Negative.   Respiratory: Negative.   Cardiovascular: Positive for leg swelling. Negative for chest pain and palpitations.  Gastrointestinal: Negative.   Musculoskeletal: Positive for arthralgias.  Neurological: Negative.        Objective:   Physical Exam  Constitutional: He is oriented to person, place, and time. He appears well-developed and well-nourished.  Cardiovascular: Normal rate, regular rhythm, normal heart sounds and intact distal pulses.   Pulmonary/Chest: Effort normal and breath sounds normal.  Abdominal: Soft. Bowel sounds are normal. He exhibits no distension and no mass. There is no tenderness. There is no rebound and no guarding.  Musculoskeletal:  2+ edema to both feet and lower legs  Neurological: He is  alert and oriented to person, place, and time.          Assessment & Plan:  He is recovering well from his knee replacement and he will finish PT later this week. He will follow up with Dr. Berenice Primas in one more week. We will try Tramadol for his feet pain and he will continue the stool softeners. He will stay on Lasix 40 mg once a day for the leg edema. We will recheck him in one month and repeat a BMET at that time.  Laurey Morale, MD

## 2016-01-14 DIAGNOSIS — Z96651 Presence of right artificial knee joint: Secondary | ICD-10-CM | POA: Diagnosis not present

## 2016-01-14 DIAGNOSIS — G629 Polyneuropathy, unspecified: Secondary | ICD-10-CM | POA: Diagnosis not present

## 2016-01-14 DIAGNOSIS — Z79891 Long term (current) use of opiate analgesic: Secondary | ICD-10-CM | POA: Diagnosis not present

## 2016-01-14 DIAGNOSIS — I872 Venous insufficiency (chronic) (peripheral): Secondary | ICD-10-CM | POA: Diagnosis not present

## 2016-01-14 DIAGNOSIS — Z471 Aftercare following joint replacement surgery: Secondary | ICD-10-CM | POA: Diagnosis not present

## 2016-01-14 DIAGNOSIS — G4733 Obstructive sleep apnea (adult) (pediatric): Secondary | ICD-10-CM | POA: Diagnosis not present

## 2016-01-14 DIAGNOSIS — N183 Chronic kidney disease, stage 3 (moderate): Secondary | ICD-10-CM | POA: Diagnosis not present

## 2016-01-14 DIAGNOSIS — Z7982 Long term (current) use of aspirin: Secondary | ICD-10-CM | POA: Diagnosis not present

## 2016-01-14 DIAGNOSIS — I129 Hypertensive chronic kidney disease with stage 1 through stage 4 chronic kidney disease, or unspecified chronic kidney disease: Secondary | ICD-10-CM | POA: Diagnosis not present

## 2016-01-14 DIAGNOSIS — Z8673 Personal history of transient ischemic attack (TIA), and cerebral infarction without residual deficits: Secondary | ICD-10-CM | POA: Diagnosis not present

## 2016-01-14 DIAGNOSIS — M810 Age-related osteoporosis without current pathological fracture: Secondary | ICD-10-CM | POA: Diagnosis not present

## 2016-01-16 DIAGNOSIS — I129 Hypertensive chronic kidney disease with stage 1 through stage 4 chronic kidney disease, or unspecified chronic kidney disease: Secondary | ICD-10-CM | POA: Diagnosis not present

## 2016-01-16 DIAGNOSIS — N183 Chronic kidney disease, stage 3 (moderate): Secondary | ICD-10-CM | POA: Diagnosis not present

## 2016-01-16 DIAGNOSIS — M810 Age-related osteoporosis without current pathological fracture: Secondary | ICD-10-CM | POA: Diagnosis not present

## 2016-01-16 DIAGNOSIS — I872 Venous insufficiency (chronic) (peripheral): Secondary | ICD-10-CM | POA: Diagnosis not present

## 2016-01-16 DIAGNOSIS — Z471 Aftercare following joint replacement surgery: Secondary | ICD-10-CM | POA: Diagnosis not present

## 2016-01-16 DIAGNOSIS — Z7982 Long term (current) use of aspirin: Secondary | ICD-10-CM | POA: Diagnosis not present

## 2016-01-16 DIAGNOSIS — Z96651 Presence of right artificial knee joint: Secondary | ICD-10-CM | POA: Diagnosis not present

## 2016-01-16 DIAGNOSIS — Z8673 Personal history of transient ischemic attack (TIA), and cerebral infarction without residual deficits: Secondary | ICD-10-CM | POA: Diagnosis not present

## 2016-01-16 DIAGNOSIS — G629 Polyneuropathy, unspecified: Secondary | ICD-10-CM | POA: Diagnosis not present

## 2016-01-16 DIAGNOSIS — G4733 Obstructive sleep apnea (adult) (pediatric): Secondary | ICD-10-CM | POA: Diagnosis not present

## 2016-01-16 DIAGNOSIS — Z79891 Long term (current) use of opiate analgesic: Secondary | ICD-10-CM | POA: Diagnosis not present

## 2016-01-20 DIAGNOSIS — M1711 Unilateral primary osteoarthritis, right knee: Secondary | ICD-10-CM | POA: Diagnosis not present

## 2016-01-21 ENCOUNTER — Other Ambulatory Visit (HOSPITAL_COMMUNITY): Payer: Self-pay | Admitting: Orthopedic Surgery

## 2016-01-21 DIAGNOSIS — M25561 Pain in right knee: Secondary | ICD-10-CM | POA: Diagnosis not present

## 2016-01-21 DIAGNOSIS — M25661 Stiffness of right knee, not elsewhere classified: Secondary | ICD-10-CM | POA: Diagnosis not present

## 2016-01-21 DIAGNOSIS — Z96651 Presence of right artificial knee joint: Secondary | ICD-10-CM | POA: Diagnosis not present

## 2016-01-21 DIAGNOSIS — R609 Edema, unspecified: Secondary | ICD-10-CM

## 2016-01-22 ENCOUNTER — Ambulatory Visit (HOSPITAL_COMMUNITY)
Admission: RE | Admit: 2016-01-22 | Discharge: 2016-01-22 | Disposition: A | Discharge status: Home / Self Care | Payer: Medicare Other | Source: Ambulatory Visit | Attending: Orthopedic Surgery | Admitting: Orthopedic Surgery

## 2016-01-22 ENCOUNTER — Encounter (HOSPITAL_COMMUNITY): Payer: Medicare Other

## 2016-01-22 DIAGNOSIS — R609 Edema, unspecified: Secondary | ICD-10-CM

## 2016-01-22 DIAGNOSIS — M7989 Other specified soft tissue disorders: Secondary | ICD-10-CM | POA: Insufficient documentation

## 2016-01-22 DIAGNOSIS — Z96651 Presence of right artificial knee joint: Secondary | ICD-10-CM | POA: Insufficient documentation

## 2016-01-22 NOTE — Progress Notes (Signed)
VASCULAR LAB PRELIMINARY  PRELIMINARY  PRELIMINARY  PRELIMINARY  Right lower extremity venous duplex completed.    Preliminary report:  Right:  No evidence of DVT, superficial thrombosis, or Baker's cyst.  Odesser Tourangeau, RVS 01/22/2016, 10:54 AM

## 2016-01-26 DIAGNOSIS — R262 Difficulty in walking, not elsewhere classified: Secondary | ICD-10-CM | POA: Diagnosis not present

## 2016-01-26 DIAGNOSIS — M25561 Pain in right knee: Secondary | ICD-10-CM | POA: Diagnosis not present

## 2016-01-26 DIAGNOSIS — M79605 Pain in left leg: Secondary | ICD-10-CM | POA: Diagnosis not present

## 2016-01-26 DIAGNOSIS — M25562 Pain in left knee: Secondary | ICD-10-CM | POA: Diagnosis not present

## 2016-01-26 DIAGNOSIS — M25661 Stiffness of right knee, not elsewhere classified: Secondary | ICD-10-CM | POA: Diagnosis not present

## 2016-01-27 ENCOUNTER — Ambulatory Visit: Payer: Medicare Other | Admitting: Family Medicine

## 2016-01-28 DIAGNOSIS — R262 Difficulty in walking, not elsewhere classified: Secondary | ICD-10-CM | POA: Diagnosis not present

## 2016-01-28 DIAGNOSIS — M25661 Stiffness of right knee, not elsewhere classified: Secondary | ICD-10-CM | POA: Diagnosis not present

## 2016-01-28 DIAGNOSIS — M79605 Pain in left leg: Secondary | ICD-10-CM | POA: Diagnosis not present

## 2016-01-28 DIAGNOSIS — M25561 Pain in right knee: Secondary | ICD-10-CM | POA: Diagnosis not present

## 2016-01-29 DIAGNOSIS — N183 Chronic kidney disease, stage 3 (moderate): Secondary | ICD-10-CM | POA: Diagnosis not present

## 2016-01-29 DIAGNOSIS — N319 Neuromuscular dysfunction of bladder, unspecified: Secondary | ICD-10-CM | POA: Diagnosis not present

## 2016-02-03 DIAGNOSIS — R262 Difficulty in walking, not elsewhere classified: Secondary | ICD-10-CM | POA: Diagnosis not present

## 2016-02-03 DIAGNOSIS — M25661 Stiffness of right knee, not elsewhere classified: Secondary | ICD-10-CM | POA: Diagnosis not present

## 2016-02-03 DIAGNOSIS — M79605 Pain in left leg: Secondary | ICD-10-CM | POA: Diagnosis not present

## 2016-02-03 DIAGNOSIS — M25561 Pain in right knee: Secondary | ICD-10-CM | POA: Diagnosis not present

## 2016-02-09 ENCOUNTER — Ambulatory Visit (HOSPITAL_COMMUNITY)
Admission: RE | Admit: 2016-02-09 | Discharge: 2016-02-09 | Disposition: A | Discharge status: Home / Self Care | Payer: Medicare Other | Source: Ambulatory Visit | Attending: Orthopedic Surgery | Admitting: Orthopedic Surgery

## 2016-02-09 ENCOUNTER — Other Ambulatory Visit (HOSPITAL_COMMUNITY): Payer: Self-pay | Admitting: Orthopedic Surgery

## 2016-02-09 DIAGNOSIS — R52 Pain, unspecified: Secondary | ICD-10-CM

## 2016-02-09 DIAGNOSIS — M7989 Other specified soft tissue disorders: Secondary | ICD-10-CM | POA: Diagnosis not present

## 2016-02-09 DIAGNOSIS — M79662 Pain in left lower leg: Secondary | ICD-10-CM | POA: Diagnosis not present

## 2016-02-09 NOTE — Progress Notes (Signed)
VASCULAR LAB PRELIMINARY  PRELIMINARY  PRELIMINARY  PRELIMINARY  Left lower extremity venous duplex completed.    Preliminary report:  Left:  No evidence of DVT, superficial thrombosis, or Baker's cyst.  Aayat Hajjar, RVS 02/09/2016, 3:34 PM

## 2016-02-17 DIAGNOSIS — M25562 Pain in left knee: Secondary | ICD-10-CM | POA: Diagnosis not present

## 2016-02-17 DIAGNOSIS — M1712 Unilateral primary osteoarthritis, left knee: Secondary | ICD-10-CM | POA: Diagnosis not present

## 2016-03-02 DIAGNOSIS — M25561 Pain in right knee: Secondary | ICD-10-CM | POA: Diagnosis not present

## 2016-03-24 ENCOUNTER — Encounter: Payer: Self-pay | Admitting: Family Medicine

## 2016-03-24 ENCOUNTER — Ambulatory Visit (INDEPENDENT_AMBULATORY_CARE_PROVIDER_SITE_OTHER): Payer: Medicare Other | Admitting: Family Medicine

## 2016-03-24 VITALS — BP 146/88 | HR 99 | Temp 98.2°F | Ht 66.0 in | Wt 201.0 lb

## 2016-03-24 DIAGNOSIS — M109 Gout, unspecified: Secondary | ICD-10-CM | POA: Diagnosis not present

## 2016-03-24 MED ORDER — METHYLPREDNISOLONE 4 MG PO TBPK: ORAL_TABLET | ORAL | 0 refills | Status: DC

## 2016-03-24 NOTE — Progress Notes (Signed)
   Subjective:    Patient ID: [name], male    DOB: July 02, 1940, 76 y.o.   MRN: [id number]  HPI Here for one month of intermittent swelling and pain in the left ankle. No recent trauma.    Review of Systems  Constitutional: Negative.   Respiratory: Negative.   Cardiovascular: Negative.   Musculoskeletal: Positive for arthralgias.       Objective:   Physical Exam  Constitutional: He appears well-developed and well-nourished.  Cardiovascular: Normal rate, regular rhythm, normal heart sounds and intact distal pulses.   Pulmonary/Chest: Effort normal and breath sounds normal.  Musculoskeletal:  The left ankle is swollen and very tender. No warmth or erythema .           Assessment & Plan:  Ankle pain, probable gout. Treat with a Medrol dose pack. Keep the foot elevated.  Alysia Penna, MD

## 2016-03-24 NOTE — Progress Notes (Signed)
Pre visit review using our clinic review tool, if applicable. No additional management support is needed unless otherwise documented below in the visit note. 

## 2016-03-26 ENCOUNTER — Telehealth: Payer: Self-pay | Admitting: Family Medicine

## 2016-03-26 NOTE — Telephone Encounter (Signed)
Patient needs Zyloprin (Alloplirinol) refilled for Gout.   Pharmacy: [facility] on [address]

## 2016-03-30 MED ORDER — ALLOPURINOL 100 MG PO TABS: 100.0000 mg | ORAL_TABLET | Freq: Every day | ORAL | 11 refills | Status: DC

## 2016-03-30 NOTE — Telephone Encounter (Signed)
Pt notified Rx sent to pharmacy. Pt verbalized understanding.

## 2016-03-30 NOTE — Telephone Encounter (Signed)
Pt requesting refill for Allopurinol. I do not see on chart. Please advise dosage and if refill okay?

## 2016-03-30 NOTE — Telephone Encounter (Signed)
Call in Allopurinol 100 mg daily, #30 with 11 rf

## 2016-05-05 ENCOUNTER — Other Ambulatory Visit: Payer: Medicare Other

## 2016-05-05 ENCOUNTER — Other Ambulatory Visit (INDEPENDENT_AMBULATORY_CARE_PROVIDER_SITE_OTHER): Payer: Medicare Other

## 2016-05-05 DIAGNOSIS — Z0000 Encounter for general adult medical examination without abnormal findings: Secondary | ICD-10-CM | POA: Diagnosis not present

## 2016-05-05 LAB — HEPATIC FUNCTION PANEL
ALT: 9 U/L (ref 0–53)
AST: 14 U/L (ref 0–37)
Albumin: 4 g/dL (ref 3.5–5.2)
Alkaline Phosphatase: 65 U/L (ref 39–117)
Bilirubin, Direct: 0.1 mg/dL (ref 0.0–0.3)
Total Bilirubin: 0.4 mg/dL (ref 0.2–1.2)
Total Protein: 6.1 g/dL (ref 6.0–8.3)

## 2016-05-05 LAB — CBC WITH DIFFERENTIAL/PLATELET
Basophils Absolute: 0.1 10*3/uL (ref 0.0–0.1)
Basophils Relative: 1.2 % (ref 0.0–3.0)
Eosinophils Absolute: 0.4 10*3/uL (ref 0.0–0.7)
Eosinophils Relative: 6.4 % — ABNORMAL HIGH (ref 0.0–5.0)
HCT: 36.6 % — ABNORMAL LOW (ref 39.0–52.0)
Hemoglobin: 12.3 g/dL — ABNORMAL LOW (ref 13.0–17.0)
Lymphocytes Relative: 20.6 % (ref 12.0–46.0)
Lymphs Abs: 1.4 10*3/uL (ref 0.7–4.0)
MCHC: 33.5 g/dL (ref 30.0–36.0)
MCV: 92.3 fl (ref 78.0–100.0)
Monocytes Absolute: 0.8 10*3/uL (ref 0.1–1.0)
Monocytes Relative: 11.2 % (ref 3.0–12.0)
Neutro Abs: 4.1 10*3/uL (ref 1.4–7.7)
Neutrophils Relative %: 60.6 % (ref 43.0–77.0)
Platelets: 208 10*3/uL (ref 150.0–400.0)
RBC: 3.97 Mil/uL — ABNORMAL LOW (ref 4.22–5.81)
RDW: 13.6 % (ref 11.5–15.5)
WBC: 6.8 10*3/uL (ref 4.0–10.5)

## 2016-05-05 LAB — LIPID PANEL
Cholesterol: 140 mg/dL (ref 0–200)
HDL: 56 mg/dL (ref >39.00)
LDL Cholesterol: 73 mg/dL (ref 0–99)
NonHDL: 84.18
Total CHOL/HDL Ratio: 3
Triglycerides: 57 mg/dL (ref 0.0–149.0)
VLDL: 11.4 mg/dL (ref 0.0–40.0)

## 2016-05-05 LAB — BASIC METABOLIC PANEL
BUN: 35 mg/dL — ABNORMAL HIGH (ref 6–23)
CO2: 26 mEq/L (ref 19–32)
Calcium: 9.3 mg/dL (ref 8.4–10.5)
Chloride: 109 mEq/L (ref 96–112)
Creatinine, Ser: 2.09 mg/dL — ABNORMAL HIGH (ref 0.40–1.50)
GFR: 32.96 mL/min — ABNORMAL LOW (ref >60.00)
Glucose, Bld: 92 mg/dL (ref 70–99)
Potassium: 4 mEq/L (ref 3.5–5.1)
Sodium: 140 mEq/L (ref 135–145)

## 2016-05-05 LAB — POC URINALSYSI DIPSTICK (AUTOMATED)
Bilirubin, UA: NEGATIVE
Blood, UA: NEGATIVE
Glucose, UA: NEGATIVE
Ketones, UA: NEGATIVE
Leukocytes, UA: NEGATIVE
Nitrite, UA: NEGATIVE
Protein, UA: NEGATIVE
Spec Grav, UA: 1.02
Urobilinogen, UA: 0.2
pH, UA: 5.5

## 2016-05-05 LAB — TSH: TSH: 2.26 u[IU]/mL (ref 0.35–4.50)

## 2016-05-05 LAB — PSA: PSA: 1.04 ng/mL (ref 0.10–4.00)

## 2016-05-11 ENCOUNTER — Telehealth: Payer: Self-pay | Admitting: Family Medicine

## 2016-05-12 ENCOUNTER — Encounter: Payer: Medicare Other | Admitting: Family Medicine

## 2016-05-13 ENCOUNTER — Encounter: Payer: Self-pay | Admitting: Family Medicine

## 2016-05-13 ENCOUNTER — Ambulatory Visit (INDEPENDENT_AMBULATORY_CARE_PROVIDER_SITE_OTHER): Payer: Medicare Other | Admitting: Family Medicine

## 2016-05-13 VITALS — BP 148/86 | HR 91 | Temp 97.5°F | Ht 63.0 in | Wt 202.5 lb

## 2016-05-13 DIAGNOSIS — L570 Actinic keratosis: Secondary | ICD-10-CM | POA: Diagnosis not present

## 2016-05-13 DIAGNOSIS — Z0000 Encounter for general adult medical examination without abnormal findings: Secondary | ICD-10-CM | POA: Diagnosis not present

## 2016-05-13 DIAGNOSIS — L814 Other melanin hyperpigmentation: Secondary | ICD-10-CM | POA: Diagnosis not present

## 2016-05-13 DIAGNOSIS — L720 Epidermal cyst: Secondary | ICD-10-CM | POA: Diagnosis not present

## 2016-05-13 MED ORDER — ALLOPURINOL 300 MG PO TABS: 300.0000 mg | ORAL_TABLET | Freq: Every day | ORAL | 3 refills | Status: DC

## 2016-05-13 NOTE — Progress Notes (Signed)
   Subjective:    Patient ID: [name], male    DOB: 09/15/1940, 76 y.o.   MRN: [id number]  HPI 76 yr old male for a well exam. He feels fine except for his gout pain. He has been taking Allopurinol 100 mg a day for years. His last uric acid level pone year ago was 7.1. He takes Tylenol as needed. He had been dealing with a lot of itching all over the body and he discovered his Lasix was the cause. He stopped taking this a month ago and the itching has greatly improved. The swelling in his legs and feet has not changed since stopping the Lasix, so it is questionable how much good it was doing him anyway.    Review of Systems  Constitutional: Negative.   HENT: Negative.   Eyes: Negative.   Respiratory: Negative.   Cardiovascular: Positive for leg swelling. Negative for chest pain and palpitations.  Gastrointestinal: Negative.   Genitourinary: Negative.   Musculoskeletal: Positive for arthralgias and joint swelling. Negative for back pain, gait problem, myalgias, neck pain and neck stiffness.  Skin: Negative.   Neurological: Negative.   Psychiatric/Behavioral: Negative.        Objective:   Physical Exam  Constitutional: He is oriented to person, place, and time. He appears well-developed and well-nourished. No distress.  HENT:  Head: Normocephalic and atraumatic.  Right Ear: External ear normal.  Left Ear: External ear normal.  Nose: Nose normal.  Mouth/Throat: Oropharynx is clear and moist. No oropharyngeal exudate.  Eyes: Conjunctivae and EOM are normal. Pupils are equal, round, and reactive to light. Right eye exhibits no discharge. Left eye exhibits no discharge. No scleral icterus.  Neck: Neck supple. No JVD present. No tracheal deviation present. No thyromegaly present.  Cardiovascular: Normal rate, regular rhythm, normal heart sounds and intact distal pulses.  Exam reveals no gallop and no friction rub.   No murmur heard. Pulmonary/Chest: Effort normal and breath sounds  normal. No respiratory distress. He has no wheezes. He has no rales. He exhibits no tenderness.  Abdominal: Soft. Bowel sounds are normal. He exhibits no distension and no mass. There is no tenderness. There is no rebound and no guarding.  Genitourinary: Rectum normal, prostate normal and penis normal. Rectal exam shows guaiac negative stool. No penile tenderness.  Musculoskeletal: Normal range of motion. He exhibits no tenderness.  2+ edema in both lower legs and feet   Lymphadenopathy:    He has no cervical adenopathy.  Neurological: He is alert and oriented to person, place, and time. He has normal reflexes. No cranial nerve deficit. He exhibits normal muscle tone. Coordination normal.  Skin: Skin is warm and dry. No rash noted. He is not diaphoretic. No erythema. No pallor.  Psychiatric: He has a normal mood and affect. His behavior is normal. Judgment and thought content normal.          Assessment & Plan:  Well exam. We discussed diet and exercise. I agree with staying off Lasix for now. For gout we will increase the Allopurinol to 300 mg daily.  Alysia Penna, MD

## 2016-05-13 NOTE — Progress Notes (Signed)
Pre visit review using our clinic review tool, if applicable. No additional management support is needed unless otherwise documented below in the visit note. 

## 2016-05-24 ENCOUNTER — Telehealth: Payer: Self-pay | Admitting: Family Medicine

## 2016-05-24 MED ORDER — ALLOPURINOL 300 MG PO TABS: 300.0000 mg | ORAL_TABLET | Freq: Two times a day (BID) | ORAL | 3 refills | Status: DC

## 2016-05-24 NOTE — Telephone Encounter (Signed)
° ° °  Pt call to ask if he can increased from 1 a day till 2 day   allopurinol (ZYLOPRIM) 300 MG tablet

## 2016-05-24 NOTE — Telephone Encounter (Signed)
I spoke with pt and sent new script e-scribe to [facility] aid.

## 2016-05-24 NOTE — Telephone Encounter (Signed)
Yes he can increase the Allopurinol to take 2 pills a day ( for a total of 600 mg). Call in #[phone] with 3 rf

## 2016-06-03 DIAGNOSIS — M25562 Pain in left knee: Secondary | ICD-10-CM | POA: Diagnosis not present

## 2016-06-03 DIAGNOSIS — M25572 Pain in left ankle and joints of left foot: Secondary | ICD-10-CM | POA: Diagnosis not present

## 2016-06-15 DIAGNOSIS — M25562 Pain in left knee: Secondary | ICD-10-CM | POA: Diagnosis not present

## 2016-06-15 DIAGNOSIS — G4733 Obstructive sleep apnea (adult) (pediatric): Secondary | ICD-10-CM | POA: Diagnosis not present

## 2016-06-25 ENCOUNTER — Other Ambulatory Visit: Payer: Self-pay

## 2016-06-25 ENCOUNTER — Telehealth: Payer: Self-pay | Admitting: Family Medicine

## 2016-06-25 MED ORDER — ALLOPURINOL 300 MG PO TABS: 300.0000 mg | ORAL_TABLET | Freq: Two times a day (BID) | ORAL | 1 refills | Status: DC

## 2016-06-25 NOTE — Telephone Encounter (Signed)
Rx has been sent in e-scribe. Thanks!

## 2016-06-25 NOTE — Telephone Encounter (Addendum)
Pt states Dr Sarajane Jews advised him to increase his allopurinol (ZYLOPRIM) 300 MG tablet  To 600 MG. Pt needs a new rx for this amount sent to  [place], [place]   Pt aware Dr Sarajane Jews is out until Monday, 4/09 and he is ok with that.

## 2016-07-02 ENCOUNTER — Telehealth: Payer: Self-pay | Admitting: Family Medicine

## 2016-07-02 NOTE — Telephone Encounter (Signed)
Yes he can increase the total dose to 600 mg daily. Take two 300 mg tabs a day. Call in #[phone] with 3 rf

## 2016-07-02 NOTE — Telephone Encounter (Signed)
I spoke with pt and went over below message. This script had already been sent e-scribe on 06/25/2016.

## 2016-07-02 NOTE — Telephone Encounter (Signed)
° °  Pt call to ask if the below med can increase   Pt ask if med can be increased from 300 to 600mg   allopurinol (ZYLOPRIM) 300 MG tablet   Pharmacy [facility]  [address]

## 2016-07-06 DIAGNOSIS — G4733 Obstructive sleep apnea (adult) (pediatric): Secondary | ICD-10-CM | POA: Diagnosis not present

## 2016-07-07 ENCOUNTER — Ambulatory Visit (INDEPENDENT_AMBULATORY_CARE_PROVIDER_SITE_OTHER): Payer: Medicare Other | Admitting: Family Medicine

## 2016-07-07 ENCOUNTER — Encounter: Payer: Self-pay | Admitting: Family Medicine

## 2016-07-07 VITALS — BP 134/85 | HR 101 | Temp 98.3°F | Ht 63.0 in | Wt 198.0 lb

## 2016-07-07 DIAGNOSIS — T887XXA Unspecified adverse effect of drug or medicament, initial encounter: Secondary | ICD-10-CM

## 2016-07-07 DIAGNOSIS — M109 Gout, unspecified: Secondary | ICD-10-CM | POA: Insufficient documentation

## 2016-07-07 DIAGNOSIS — M1000 Idiopathic gout, unspecified site: Secondary | ICD-10-CM

## 2016-07-07 DIAGNOSIS — T50905A Adverse effect of unspecified drugs, medicaments and biological substances, initial encounter: Secondary | ICD-10-CM

## 2016-07-07 MED ORDER — METHYLPREDNISOLONE ACETATE 80 MG/ML IJ SUSP
120.0000 mg | Freq: Once | INTRAMUSCULAR | Status: AC
  Administered 2016-07-07: 120 mg via INTRAMUSCULAR

## 2016-07-07 NOTE — Addendum Note (Signed)
Addended by: Aggie Hacker A on: 07/07/2016 04:19 PM   Modules accepted: Orders

## 2016-07-07 NOTE — Progress Notes (Signed)
Pre visit review using our clinic review tool, if applicable. No additional management support is needed unless otherwise documented below in the visit note. 

## 2016-07-07 NOTE — Progress Notes (Signed)
   Subjective:    Patient ID: [name], male    DOB: Dec 15, 1940, 76 y.o.   MRN: [id number]  HPI Here for several days of intense itching all over the body and a rash on the trunk. No throat swelling or SOB. He has been taking Allopurinol for years for gout prevention but had taken only 100 mg a day. Several months ago we increased this to taking 300 mg a day, and then a week ago increased this again to 600 mg a day. He tells me he has had some mild itching ever since we first increased the dose, but he didn't think that was related to the medication. Then with the last dose increase the itching is much worse.    Review of Systems  Constitutional: Negative.   Respiratory: Negative.   Cardiovascular: Negative.   Skin: Positive for rash.       Objective:   Physical Exam  Constitutional: He appears well-developed and well-nourished. No distress.  Cardiovascular: Normal rate, regular rhythm, normal heart sounds and intact distal pulses.   Pulmonary/Chest: Effort normal and breath sounds normal.  Skin:  Areas of macular erythema on the lower back           Assessment & Plan:  Rash, probably an allergic reaction to Allopurinol. We will stop this completely. Given a steroid shot. We will stay off gout maintenance medication for awhile and see how he does.  Alysia Penna, MD

## 2016-07-07 NOTE — Patient Instructions (Signed)
WE NOW OFFER   [facility] FAST TRACK!!!  SAME DAY Appointments for ACUTE CARE  Such as: Sprains, Injuries, cuts, abrasions, rashes, muscle pain, joint pain, back pain Colds, flu, sore throats, headache, allergies, cough, fever  Ear pain, sinus and eye infections Abdominal pain, nausea, vomiting, diarrhea, upset stomach Animal/insect bites  3 Easy Ways to Schedule: Walk-In Scheduling Call in scheduling Mychart Sign-up: [email or web address]         

## 2016-07-28 DIAGNOSIS — H5000 Unspecified esotropia: Secondary | ICD-10-CM | POA: Diagnosis not present

## 2016-07-28 DIAGNOSIS — H35033 Hypertensive retinopathy, bilateral: Secondary | ICD-10-CM | POA: Diagnosis not present

## 2016-07-28 DIAGNOSIS — H5213 Myopia, bilateral: Secondary | ICD-10-CM | POA: Diagnosis not present

## 2016-07-28 DIAGNOSIS — H43813 Vitreous degeneration, bilateral: Secondary | ICD-10-CM | POA: Diagnosis not present

## 2016-07-28 DIAGNOSIS — H04123 Dry eye syndrome of bilateral lacrimal glands: Secondary | ICD-10-CM | POA: Diagnosis not present

## 2016-08-03 NOTE — Telephone Encounter (Signed)
Called to see if pt wanted to schedule awv - left message.  

## 2016-08-09 ENCOUNTER — Encounter: Payer: Self-pay | Admitting: Family Medicine

## 2016-08-09 ENCOUNTER — Ambulatory Visit (INDEPENDENT_AMBULATORY_CARE_PROVIDER_SITE_OTHER): Payer: Medicare Other | Admitting: Family Medicine

## 2016-08-09 VITALS — BP 132/84 | Temp 98.0°F | Ht 63.0 in | Wt 195.0 lb

## 2016-08-09 DIAGNOSIS — H60312 Diffuse otitis externa, left ear: Secondary | ICD-10-CM

## 2016-08-09 DIAGNOSIS — M1000 Idiopathic gout, unspecified site: Secondary | ICD-10-CM

## 2016-08-09 DIAGNOSIS — G4733 Obstructive sleep apnea (adult) (pediatric): Secondary | ICD-10-CM

## 2016-08-09 MED ORDER — CIPROFLOXACIN-DEXAMETHASONE 0.3-0.1 % OT SUSP: 4.0000 [drp] | Freq: Two times a day (BID) | OTIC | 2 refills | Status: DC

## 2016-08-09 NOTE — Progress Notes (Signed)
   Subjective:    Patient ID: [name], male    DOB: 05/29/1940, 76 y.o.   MRN: [id number]  HPI Here for 3 problems. First he has had intermittent left ear pain for the past week. No trouble hearing. Second he describes chronic fatigue the past few months and he often has to fight the urge to fall asleep during the daytime. He has known sleep apnea and wears a nasal mounted CPAP, but he has not been evaluated for this for many years. He says he sleeps well at night so far as he knows. Third he suffers from frequent diffuse joint pains that he assumes are related to gout. He had to stop taking Allopurinol due to itching. He uses OTC pains meds as needed.    Review of Systems  Constitutional: Positive for fatigue.  HENT: Positive for ear pain. Negative for congestion, hearing loss, sinus pain and sinus pressure.   Respiratory: Negative.   Cardiovascular: Negative.   Musculoskeletal: Positive for arthralgias, back pain and gait problem.       Objective:   Physical Exam  Constitutional: He is oriented to person, place, and time. He appears well-developed and well-nourished.  HENT:  Right Ear: External ear normal.  Nose: Nose normal.  Mouth/Throat: Oropharynx is clear and moist.  Left ear canal is red. No cerumen is seen. The TM is clear.   Eyes: Conjunctivae are normal.  Neck: No thyromegaly present.  Cardiovascular: Normal rate, regular rhythm, normal heart sounds and intact distal pulses.   Pulmonary/Chest: Effort normal and breath sounds normal. No respiratory distress. He has no wheezes. He has no rales.  Lymphadenopathy:    He has no cervical adenopathy.  Neurological: He is alert and oriented to person, place, and time.          Assessment & Plan:  He has otitis externa in the left ear which we will treat with Ciprodex drops. For the gout and other sources of joint pains we will refer him to Rheumatology. We will also refer him to Pulmonary for sleep apnea and to adjust  his equipment as needed. Alysia Penna, MD

## 2016-08-20 ENCOUNTER — Encounter: Payer: Self-pay | Admitting: Pulmonary Disease

## 2016-08-20 ENCOUNTER — Ambulatory Visit (INDEPENDENT_AMBULATORY_CARE_PROVIDER_SITE_OTHER): Payer: Medicare Other | Admitting: Pulmonary Disease

## 2016-08-20 DIAGNOSIS — G4733 Obstructive sleep apnea (adult) (pediatric): Secondary | ICD-10-CM | POA: Diagnosis not present

## 2016-08-20 DIAGNOSIS — G4710 Hypersomnia, unspecified: Secondary | ICD-10-CM | POA: Insufficient documentation

## 2016-08-20 NOTE — Progress Notes (Signed)
   Subjective:    Patient ID: [name], male    DOB: 1941/02/05, 76 y.o.   MRN: [id number]  HPI  76 year old for follow-up of OSA, maintained on CPAP of 16 cm Last seen by Dr. Gwenette Greet in 07/2014 He has bipolar disorder and is maintained on Abilify and MAO inhibitor. He developed chronic kidney disease due to lithium  He is to be on a full face mask, but changed about a year ago to nasal pillows and is really like this mask interface. He has received good benefits from being on CPAP therapy however he continues to remain tired. Bedtime is around 10 PM, sleep latency is minimal, reports one to 2 nocturnal awakenings and is out of bed by 7 AM feeling rested but then gets tired by afternoon.  CPAP download was reviewed that shows excellent control of events on 16 cm with mild leak and good compliance about 6 hours per night on average  Significant tests/ events reviewed  NPSG 2010:  AHI 32/hr with desat to 78%    Past Medical History:  Diagnosis Date  . Arthritis    back  . Back pain    reason unknown  . Benign prostatic hyperplasia (BPH) with urinary urge incontinence    sees Dr. Jeffie Pollock   . Bipolar 1 disorder [facility])    sees Dr. Norma Fredrickson  . CKD (chronic kidney disease)    (Dr. Erling Cruz)  . Depression    takes Parnate daily  . Headache(784.0)    occasionally  . History of colon polyps   . Joint pain   . Joint swelling   . Osteoporosis    takes Drisdol weekly and Caltrate daily  . Peripheral edema    takes Lasix daily   . Sleep apnea    wears c-pap  . Swelling    right hand    Review of Systems neg for any significant sore throat, dysphagia, itching, sneezing, nasal congestion or excess/ purulent secretions, fever, chills, sweats, unintended wt loss, pleuritic or exertional cp, hempoptysis, orthopnea pnd or change in chronic leg swelling. Also denies presyncope, palpitations, heartburn, abdominal pain, nausea, vomiting, diarrhea or change in bowel or urinary  habits, dysuria,hematuria, rash, arthralgias, visual complaints, headache, numbness weakness or ataxia.     Objective:   Physical Exam   Gen. Pleasant, obese, in no distress ENT - no lesions, no post nasal drip Neck: No JVD, no thyromegaly, no carotid bruits Lungs: no use of accessory muscles, no dullness to percussion, decreased without rales or rhonchi  Cardiovascular: Rhythm regular, heart sounds  normal, no murmurs or gallops, 2+ peripheral edema Musculoskeletal: No deformities, no cyanosis or clubbing , no tremors        Assessment & Plan:

## 2016-08-20 NOTE — Patient Instructions (Signed)
CPAP supplies will be renewed for a year. Your sleepiness may be related to kidney disease and to antidepressants

## 2016-08-20 NOTE — Assessment & Plan Note (Signed)
Remains sleepy in spite of good compliance with CPAP and adequate control of events and download. Not a great candidate for stimulant therapy. Sleepiness may be related to antidepressants or to chronic kidney disease

## 2016-08-20 NOTE — Assessment & Plan Note (Signed)
CPAP is set at 16 cm and seems to be working well. He has received benefits from this. CPAP supplies will be renewed for a year.  Weight loss encouraged, compliance with goal of at least 4-6 hrs every night is the expectation. Advised against medications with sedative side effects Cautioned against driving when sleepy - understanding that sleepiness will vary on a day to day basis

## 2016-08-20 NOTE — Addendum Note (Signed)
Addended by: Valerie Salts on: 08/20/2016 04:19 PM   Modules accepted: Orders

## 2016-09-03 DIAGNOSIS — M79672 Pain in left foot: Secondary | ICD-10-CM | POA: Diagnosis not present

## 2016-09-03 DIAGNOSIS — M2140 Flat foot [pes planus] (acquired), unspecified foot: Secondary | ICD-10-CM | POA: Diagnosis not present

## 2016-09-03 DIAGNOSIS — M1990 Unspecified osteoarthritis, unspecified site: Secondary | ICD-10-CM | POA: Diagnosis not present

## 2016-09-03 DIAGNOSIS — M779 Enthesopathy, unspecified: Secondary | ICD-10-CM | POA: Diagnosis not present

## 2016-09-03 DIAGNOSIS — M79671 Pain in right foot: Secondary | ICD-10-CM | POA: Diagnosis not present

## 2016-09-03 DIAGNOSIS — M25572 Pain in left ankle and joints of left foot: Secondary | ICD-10-CM | POA: Diagnosis not present

## 2016-09-03 LAB — BASIC METABOLIC PANEL
BUN: 40 — AB (ref 4–21)
CREATININE: 2.1 — AB (ref 0.6–1.3)
Glucose: 59
Potassium: 4.4 (ref 3.4–5.3)
Sodium: 140 (ref 137–147)

## 2016-09-03 LAB — HEPATIC FUNCTION PANEL
ALK PHOS: 64 (ref 25–125)
ALT: 12 (ref 10–40)
AST: 15 (ref 14–40)
Bilirubin, Total: 0.5

## 2016-09-03 LAB — POCT ERYTHROCYTE SEDIMENTATION RATE, NON-AUTOMATED: SED RATE: 3

## 2016-09-03 LAB — CBC AND DIFFERENTIAL
HEMATOCRIT: 38 — AB (ref 41–53)
HEMOGLOBIN: 12.5 — AB (ref 13.5–17.5)
PLATELETS: 168 (ref 150–399)
WBC: 6.1

## 2016-09-08 ENCOUNTER — Encounter: Payer: Self-pay | Admitting: Family Medicine

## 2016-09-17 DIAGNOSIS — M1990 Unspecified osteoarthritis, unspecified site: Secondary | ICD-10-CM | POA: Diagnosis not present

## 2016-09-17 DIAGNOSIS — M2140 Flat foot [pes planus] (acquired), unspecified foot: Secondary | ICD-10-CM | POA: Diagnosis not present

## 2016-09-17 DIAGNOSIS — M25572 Pain in left ankle and joints of left foot: Secondary | ICD-10-CM | POA: Diagnosis not present

## 2016-09-17 DIAGNOSIS — M79672 Pain in left foot: Secondary | ICD-10-CM | POA: Diagnosis not present

## 2016-09-28 ENCOUNTER — Encounter: Payer: Self-pay | Admitting: Family Medicine

## 2016-09-30 DIAGNOSIS — M79672 Pain in left foot: Secondary | ICD-10-CM | POA: Diagnosis not present

## 2016-09-30 DIAGNOSIS — M25561 Pain in right knee: Secondary | ICD-10-CM | POA: Diagnosis not present

## 2016-11-24 DIAGNOSIS — R600 Localized edema: Secondary | ICD-10-CM | POA: Diagnosis not present

## 2016-11-24 DIAGNOSIS — M779 Enthesopathy, unspecified: Secondary | ICD-10-CM | POA: Diagnosis not present

## 2016-11-24 DIAGNOSIS — M79672 Pain in left foot: Secondary | ICD-10-CM | POA: Diagnosis not present

## 2016-11-24 DIAGNOSIS — Q6652 Congenital pes planus, left foot: Secondary | ICD-10-CM | POA: Diagnosis not present

## 2016-11-25 DIAGNOSIS — N183 Chronic kidney disease, stage 3 (moderate): Secondary | ICD-10-CM | POA: Diagnosis not present

## 2016-12-15 ENCOUNTER — Other Ambulatory Visit (HOSPITAL_COMMUNITY): Payer: Self-pay | Admitting: Psychiatry

## 2017-01-25 ENCOUNTER — Telehealth: Payer: Self-pay | Admitting: General Practice

## 2017-01-25 NOTE — Telephone Encounter (Signed)
Patient called in reference to wanting to establish with Dr. Yong Channel. Patient stated his "girlfriend" sees Dr. Yong Channel as well. I informed patient Dr. Yong Channel would review his chart and someone would be in touch with him. Please advise.

## 2017-01-25 NOTE — Telephone Encounter (Signed)
Yes thanks- will accept

## 2017-01-28 ENCOUNTER — Other Ambulatory Visit (HOSPITAL_COMMUNITY): Payer: Self-pay | Admitting: Psychiatry

## 2017-02-28 ENCOUNTER — Ambulatory Visit: Payer: Medicare Other | Admitting: Family Medicine

## 2017-03-01 DIAGNOSIS — L570 Actinic keratosis: Secondary | ICD-10-CM | POA: Diagnosis not present

## 2017-03-01 DIAGNOSIS — D485 Neoplasm of uncertain behavior of skin: Secondary | ICD-10-CM | POA: Diagnosis not present

## 2017-03-01 DIAGNOSIS — L853 Xerosis cutis: Secondary | ICD-10-CM | POA: Diagnosis not present

## 2017-03-01 DIAGNOSIS — L738 Other specified follicular disorders: Secondary | ICD-10-CM | POA: Diagnosis not present

## 2017-03-01 DIAGNOSIS — L814 Other melanin hyperpigmentation: Secondary | ICD-10-CM | POA: Diagnosis not present

## 2017-03-08 ENCOUNTER — Ambulatory Visit (INDEPENDENT_AMBULATORY_CARE_PROVIDER_SITE_OTHER): Payer: Medicare Other | Admitting: Family Medicine

## 2017-03-08 ENCOUNTER — Encounter: Payer: Self-pay | Admitting: Family Medicine

## 2017-03-08 VITALS — BP 118/78 | HR 91 | Temp 98.2°F | Ht 63.0 in | Wt 194.2 lb

## 2017-03-08 DIAGNOSIS — M810 Age-related osteoporosis without current pathological fracture: Secondary | ICD-10-CM

## 2017-03-08 DIAGNOSIS — M545 Low back pain: Secondary | ICD-10-CM | POA: Diagnosis not present

## 2017-03-08 DIAGNOSIS — F3131 Bipolar disorder, current episode depressed, mild: Secondary | ICD-10-CM | POA: Diagnosis not present

## 2017-03-08 DIAGNOSIS — N183 Chronic kidney disease, stage 3 unspecified: Secondary | ICD-10-CM

## 2017-03-08 DIAGNOSIS — M542 Cervicalgia: Secondary | ICD-10-CM | POA: Diagnosis not present

## 2017-03-08 DIAGNOSIS — K760 Fatty (change of) liver, not elsewhere classified: Secondary | ICD-10-CM | POA: Diagnosis not present

## 2017-03-08 DIAGNOSIS — Z23 Encounter for immunization: Secondary | ICD-10-CM | POA: Diagnosis not present

## 2017-03-08 DIAGNOSIS — M1000 Idiopathic gout, unspecified site: Secondary | ICD-10-CM

## 2017-03-08 DIAGNOSIS — G8929 Other chronic pain: Secondary | ICD-10-CM

## 2017-03-08 NOTE — Progress Notes (Signed)
Phone: [phone]  Subjective:  Patient presents today to establish care with me as their new primary care provider. Patient was formerly a patient of Dr. Sarajane Jews. Chief complaint-noted.   See problem oriented charting ROS- complains of neck pain without pain or paresthesias in arms, chronic back pain. No chest pain. No hot, swollen joints  The following were reviewed and entered/updated in epic: Past Medical History:  Diagnosis Date  . Arthritis    back  . Back pain    reason unknown  . Benign prostatic hyperplasia (BPH) with urinary urge incontinence    sees Dr. Jeffie Pollock   . Bipolar 1 disorder [facility])    sees Dr. Norma Fredrickson  . CKD (chronic kidney disease)    (Dr. Erling Cruz)  . Depression    takes Parnate daily  . Diverticulitis of colon 05/27/2015  . Essential hypertension 04/11/2007   Lasix in the past. Now controlled without meds   . Headache(784.0)    occasionally  . History of colon polyps   . Hypothyroidism 10/03/2009   Noted by Dr. Arnoldo Morale- patient states never on medicine- may have been checked due to parnate and abilify   . 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 Joint pain   . Joint swelling   . Osteoporosis    takes Drisdol weekly and Caltrate daily  . Peripheral edema    takes Lasix daily   . Shingles 05/27/2015  . Sleep apnea    wears c-pap  . Swelling    right hand   Patient Active Problem List   Diagnosis Date Noted  . Mild depressed bipolar I disorder ([place]) 09/21/2006    Priority: High  . Fatty liver 10/09/2014    Priority: Medium  . Obstructive sleep apnea 10/30/2008    Priority: Medium  . Osteoporosis 09/25/2008    Priority: Medium  . Chronic kidney disease (CKD), stage III (moderate) (HCC) 03/01/2008    Priority: Medium  . Hypersomnolence 08/20/2016    Priority: Low  . Gout 07/07/2016    Priority: Low  . Constipation 01/13/2016    Priority: Low  . Primary osteoarthritis of right knee  12/08/2015    Priority: Low  . Gallstones 10/09/2014    Priority: Low  . Bilateral lower extremity edema 09/25/2014    Priority: Low  . Chronic venous insufficiency 08/29/2013    Priority: Low  . Right shoulder pain 08/29/2013    Priority: Low  . Chronic low back pain 03/08/2017   Past Surgical History:  Procedure Laterality Date  . COLONOSCOPY W/ POLYPECTOMY  03-10-15   per Dr. Havery Moros, adenomatous polyps, repeat in 3 yrs   . REVERSE SHOULDER ARTHROPLASTY Right 05/22/2013   Procedure: REVERSE SHOULDER ARTHROPLASTY;  Surgeon: Nita Sells, MD;  Location: [place];  Service: Orthopedics;  Laterality: Right;  Right reverse total shoulder  . TONSILLECTOMY    . TOTAL KNEE ARTHROPLASTY Right 12/08/2015   Procedure: TOTAL KNEE ARTHROPLASTY;  Surgeon: Dorna Leitz, MD;  Location: [place];  Service: Orthopedics;  Laterality: Right;    Family History  Problem Relation Age of Onset  . Cancer Mother        unknown type  . Stroke Father   . Cancer Maternal Grandmother   . Appendicitis Paternal Grandfather   . Prostate cancer Brother   . Colon cancer Neg Hx   . Esophageal cancer Neg Hx   . Rectal cancer Neg Hx   . Stomach cancer  Neg Hx     Medications- reviewed and updated Current Outpatient Medications  Medication Sig Dispense Refill  . ARIPiprazole (ABILIFY) 5 MG tablet Take 1 tablet (5 mg total) by mouth daily. (Patient taking differently: Take 2.5 mg by mouth daily. ) 90 tablet 3  . Calcium-Vitamin D (CALTRATE 600 PLUS-VIT D PO) Take 1 tablet by mouth daily.     . Cholecalciferol (PA VITAMIN D-3 GUMMY PO) Take 2 tablets by mouth 2 (two) times daily.    Marland Kitchen L-Methylfolate-Algae (L-METHYLFOLATE FORTE) 15-90.314 MG CAPS Take 1 capsule by mouth daily.  0  . tranylcypromine (PARNATE) 10 MG tablet Take 30 mg by mouth 2 (two) times daily.      No current facility-administered medications for this visit.     Allergies-reviewed and updated Allergies  Allergen Reactions  .  Allopurinol Rash    Social History   Socioeconomic History  . Marital status: Divorced    Spouse name: None  . Number of children: None  . Years of education: None  . Highest education level: None  Social Needs  . Financial resource strain: None  . Food insecurity - worry: None  . Food insecurity - inability: None  . Transportation needs - medical: None  . Transportation needs - non-medical: None  Occupational History  . None  Tobacco Use  . Smoking status: Never Smoker  . Smokeless tobacco: Never Used  Substance and Sexual Activity  . Alcohol use: No    Alcohol/week: 0.0 oz  . Drug use: No  . Sexual activity: Yes  Other Topics Concern  . None  Social History Narrative   GF Luther Redo. 2 sons, 1 daughter from prior relationship      Retired from [employer] traffic [employer] business in [place]: movies, time around the house, out for dinner    Objective: BP 118/78 (BP Location: Left Arm, Patient Position: Sitting, Cuff Size: Large)   Pulse 91   Temp 98.2 F (36.8 C) (Oral)   Ht 5\' 3"  (1.6 m)   Wt 194 lb 3.2 oz (88.1 kg)   SpO2 95%   BMI 34.40 kg/m  Gen: NAD, resting comfortably CV: RRR no murmurs rubs or gallops Lungs: CTAB no crackles, wheeze, rhonchi Abdomen: soft/nontender/nondistended/normal bowel sounds. obese Ext: trace edema, venous stasis changes noted Skin: warm, dry Msk: reports pain in neck with moving it, no pain on palpation - he states pain is "deeper"  Assessment/Plan:  Neck pain S: neck pain for 2 weeks. Tylenol 325 mg- 2 in Am and 2 at night not helping much. No radiation into arms or legs. Tension in back of neck. No fall or injury A/P: will try adding some heat. He would like further workup so will set up visit with Dr. Paulla Fore. X-ray cervical spine reasonable especially with osteoporosis history though no fall or injury. Discussed with patient DDD cervical spine most likely given findings in lumbar spine.   Chronic kidney disease (CKD),  stage III (moderate) (HCC) S: CKD III with GFR usually in 30-35 range. Knows to avoid nsaids and have given renal diet in past. Also follows up with [facility]- Dr. Florene Glen. Notes seem to suggest lithium may have played a part A/P: update bmet today, hopeful for GFR above 30  Osteoporosis S: on calcium/vitamin D. DEXA done 04/2014 showed osteoporosis with worst T score 02.9. Options limited with GFR depressed. Could use prolia possibly- In the end I asked him to discuss with Dr. Florene Glen when I last  saw him 2 years ago A/P: He is to see Dr. Florene Glen and will discuss prolia - we will then update dexa at physical and likely start medication.   Fatty liver S: has lost 10 lbs this year!  Fatty liver noted in past A/P: update LFTs. Also noted elevated a1c in past at 5.7- could consider a1c with future visit  Mild depressed bipolar I disorder ([place]) Remains on abilify, parnate, l methylfolate forte through psychiatry. Dr. Casimiro Needle. Depression controlled per his report- get phq9 at follow up  Gout Was told gout but later saw "gout doctor" and told not gout- notes seem to differ and did suggest at least considering uric acid lowering agent. [facility] him   Future Appointments  Date Time Provider [place]  03/09/2017 10:00 AM [name], DO [facility]  06/22/2017 10:00 AM [name], MD [facility]   Return in about 6 months (around 09/06/2017) for physical, come fasting.  Orders Placed This Encounter  Procedures  . Flu vaccine HIGH DOSE PF  . CBC    [place]  . Comprehensive metabolic panel    [place]  . Ambulatory referral to Sports Medicine    Referral Priority:   Routine    Referral Type:   Consultation    Referred to Provider:   [name], DO    Number of Visits Requested:   1    Return precautions advised.  Garret Reddish, MD

## 2017-03-08 NOTE — Assessment & Plan Note (Signed)
Remains on abilify, parnate, l methylfolate forte through psychiatry. Dr. Casimiro Needle. Depression controlled per his report- get phq9 at follow up

## 2017-03-08 NOTE — Assessment & Plan Note (Signed)
S: has lost 10 lbs this year!  Fatty liver noted in past A/P: update LFTs. Also noted elevated a1c in past at 5.7- could consider a1c with future visit

## 2017-03-08 NOTE — Assessment & Plan Note (Signed)
S: on calcium/vitamin D. DEXA done 04/2014 showed osteoporosis with worst T score 02.9. Options limited with GFR depressed. Could use prolia possibly- In the end I asked him to discuss with Dr. Florene Glen when I last saw him 2 years ago A/P: He is to see Dr. Florene Glen and will discuss prolia - we will then update dexa at physical and likely start medication.

## 2017-03-08 NOTE — Assessment & Plan Note (Signed)
S: CKD III with GFR usually in 30-35 range. Knows to avoid nsaids and have given renal diet in past. Also follows up with [facility]- Dr. Florene Glen. Notes seem to suggest lithium may have played a part A/P: update bmet today, hopeful for GFR above 30

## 2017-03-08 NOTE — Assessment & Plan Note (Signed)
Was told gout but later saw "gout doctor" and told not gout- notes seem to differ and did suggest at least considering uric acid lowering agent. [facility] saw him

## 2017-03-08 NOTE — Patient Instructions (Addendum)
Ask Dr. Florene Glen what he thinks about prolia for your osteoporosis. I am going to check a bone density next year and then we will consider starting this  [place] job losing 10 lbs this year! Weight loss is key in preventing any issues from fatty liver discovered a few years ago  For neck 1. Try heat 20 minutes 3x a day for next 3-5 days 2. Continue tylenol 3. Stop by desk to schedule visit with Dr. Paulla Fore of sports medicine somewhere in next 1-2 weeks  Please stop by lab before you go

## 2017-03-09 ENCOUNTER — Ambulatory Visit (INDEPENDENT_AMBULATORY_CARE_PROVIDER_SITE_OTHER): Payer: Medicare Other

## 2017-03-09 ENCOUNTER — Ambulatory Visit: Payer: Medicare Other | Admitting: Sports Medicine

## 2017-03-09 VITALS — BP 130/78 | HR 93 | Ht 63.0 in | Wt 192.2 lb

## 2017-03-09 DIAGNOSIS — R899 Unspecified abnormal finding in specimens from other organs, systems and tissues: Secondary | ICD-10-CM

## 2017-03-09 DIAGNOSIS — M47812 Spondylosis without myelopathy or radiculopathy, cervical region: Secondary | ICD-10-CM | POA: Diagnosis not present

## 2017-03-09 DIAGNOSIS — G8929 Other chronic pain: Secondary | ICD-10-CM

## 2017-03-09 DIAGNOSIS — M25511 Pain in right shoulder: Secondary | ICD-10-CM

## 2017-03-09 DIAGNOSIS — M4802 Spinal stenosis, cervical region: Secondary | ICD-10-CM | POA: Diagnosis not present

## 2017-03-09 DIAGNOSIS — M810 Age-related osteoporosis without current pathological fracture: Secondary | ICD-10-CM

## 2017-03-09 DIAGNOSIS — M542 Cervicalgia: Secondary | ICD-10-CM

## 2017-03-09 DIAGNOSIS — M1000 Idiopathic gout, unspecified site: Secondary | ICD-10-CM | POA: Diagnosis not present

## 2017-03-09 LAB — CBC
HCT: 39.9 % (ref 39.0–52.0)
Hemoglobin: 13.2 g/dL (ref 13.0–17.0)
MCHC: 33.1 g/dL (ref 30.0–36.0)
MCV: 98.4 fl (ref 78.0–100.0)
Platelets: 223 10*3/uL (ref 150.0–400.0)
RBC: 4.05 Mil/uL — ABNORMAL LOW (ref 4.22–5.81)
RDW: 12 % (ref 11.5–15.5)
WBC: 8 10*3/uL (ref 4.0–10.5)

## 2017-03-09 LAB — COMPREHENSIVE METABOLIC PANEL
ALK PHOS: 51 U/L (ref 39–117)
ALT: 8 U/L (ref 0–53)
AST: 14 U/L (ref 0–37)
Albumin: 4.2 g/dL (ref 3.5–5.2)
BILIRUBIN TOTAL: 0.4 mg/dL (ref 0.2–1.2)
BUN: 51 mg/dL — AB (ref 6–23)
CO2: 25 meq/L (ref 19–32)
Calcium: 9.5 mg/dL (ref 8.4–10.5)
Chloride: 104 mEq/L (ref 96–112)
Creatinine, Ser: 2.25 mg/dL — ABNORMAL HIGH (ref 0.40–1.50)
GFR: 30.2 mL/min — ABNORMAL LOW (ref >60.00)
GLUCOSE: 88 mg/dL (ref 70–99)
Potassium: 4.5 mEq/L (ref 3.5–5.1)
SODIUM: 137 meq/L (ref 135–145)
TOTAL PROTEIN: 6.9 g/dL (ref 6.0–8.3)

## 2017-03-09 MED ORDER — BACLOFEN 10 MG PO TABS: ORAL_TABLET | ORAL | 1 refills | Status: DC

## 2017-03-09 MED ORDER — PREDNISONE 20 MG PO TABS: 20.0000 mg | ORAL_TABLET | Freq: Every day | ORAL | 0 refills | Status: DC

## 2017-03-09 NOTE — Progress Notes (Signed)
[name]. [place], [place] at [facility] [place] [phone]  [name] - 76 y.o. male MRN [id number]  Date of birth: 04-Mar-1941  Visit Date: 03/09/2017  PCP: [name], MD   Referred by: [name], MD   Scribe for today's visit: [name], ATC    SUBJECTIVE:  [name] is here for New Patient (Initial Visit) (Neck pain) .  Referred by: Dr. Yong Rivera His neck pain symptoms INITIALLY: Began about 2-3 weeks w/ no MOI noted. Described as a constant 6/10 aching pain w/ no pain radiating into his UEs. Worsened with movement, particularly w/ rotation Improved with: nothing noted Additional associated symptoms include: no radiating pain into B UEs and no N/T tingling    At this time symptoms are worsening compared to onset with increased pain noted. He has been taking Tylenol (2 pills bid).   ROS Denies night time disturbances. Denies fevers, chills, or night sweats. Denies unexplained weight loss. Denies personal history of cancer. Denies changes in bowel or bladder habits. Denies recent unreported falls. Reports new or worsening dyspnea or wheezing. Yes to occasional SOB. Denies headaches or dizziness.  Denies numbness, tingling or weakness  In the extremities.  Denies dizziness or presyncopal episodes Denies lower extremity edema     HISTORY & PERTINENT PRIOR DATA:  Prior History reviewed and updated per electronic medical record.  Significant history, findings, studies and interim changes include:  reports that  has never smoked. he has never used smokeless tobacco. No results for input(s): HGBA1C, LABURIC, CREATINE in the last 8760 hours. Dr. Kathlene Rivera - Rheumatology - has not followed up [place] for [name]  Osteoarthritis of Spine  Abnormal Laboratory Test Result   Has been seen by Dr. Kathlene Rivera and recommended to be on urate lowering therapy.      Lab 08/2016    RF Negative  Anti-CCP Positive - 30  Uric Acid 7.3        OBJECTIVE:  VS:  HT:5\' 3"  (160 cm)   WT:192 lb 3.2 oz (87.2 kg)  BMI:34.06    BP:130/78  HR:93bpm  TEMP: ( )  RESP:97 %  PHYSICAL EXAM: Constitutional: WDWN, Non-toxic appearing. Psychiatric: Alert & appropriately interactive. Not depressed or anxious appearing. Respiratory: No increased work of breathing. Trachea Midline Eyes: Pupils are equal. EOM intact without nystagmus. No scleral icterus Cardiovascular:  Peripheral Pulses: peripheral pulses symmetrical No clubbing or cyanosis appreciated Capillary Refill is normal, less than 2 seconds No signficant generalized edema/anasarca Sensory Exam: intact to light touch  Neck is overall well aligned.  He has no significant torticollis but does have limitations with cervical sidebending and rotation.  No significant pain with axial load.  No significant radicular symptoms with Spurling's compression test or Lhermitte's compression test.  Upper extremity strength is 5+/5 in all myotomes.  He has focal pain over the cervical facets.    No additional findings.   ASSESSMENT & PLAN:   1. Age-related osteoporosis without current pathological fracture   2. Neck pain   3. Chronic right shoulder pain   4. Idiopathic gout, unspecified chronicity, unspecified site   5. Abnormal laboratory test result   6. Spondylosis of cervical region without myelopathy or radiculopathy    PLAN: Can consider PT follow-up if any lack of improvement. Abnormal laboratory test result May want to consider reintroducing Uloric especially in the setting of his underlying CKD.  Recommend trying to avoid daily colchicine  although this was discussed with Dr. Kathlene Rivera   Osteoarthritis of spine Cervical traction unit at home discussed.  Very low-dose of steroid given his underlying osteoporosis.  But he is not a candidate for anti-inflammatory effects.  Suspect primary facet mediated pain.  Will trial  low-dose baclofen.  Gentle cervical range of motion recommended at home   ++++++++++++++++++++++++++++++++++++++++++++ Orders & Meds: Orders Placed This Encounter  Procedures  . DG Cervical Spine 2 or 3 views    Meds ordered this encounter  Medications  . baclofen (LIORESAL) 10 MG tablet    Sig: Take 1 tablet (10 mg total) by mouth at bedtime. May also take 1 tablet (10 mg total) 2 (two) times daily as needed for muscle spasms.    Dispense:  60 tablet    Refill:  1  . predniSONE (DELTASONE) 20 MG tablet    Sig: Take 1 tablet (20 mg total) by mouth daily with breakfast.    Dispense:  5 tablet    Refill:  0    ++++++++++++++++++++++++++++++++++++++++++++ Follow-up: Return in about 6 weeks (around 04/20/2017).   Pertinent documentation may be included in additional procedure notes, imaging studies, problem based documentation and patient instructions. Please see these sections of the encounter for additional information regarding this visit. CMA/ATC served as Education administrator during this visit. History, Physical, and Plan performed by medical provider. Documentation and orders reviewed and attested to.      [name], [place] [facility] Physician

## 2017-03-09 NOTE — Assessment & Plan Note (Signed)
May want to consider reintroducing Uloric especially in the setting of his underlying CKD.  Recommend trying to avoid daily colchicine although this was discussed with [name]

## 2017-03-09 NOTE — Patient Instructions (Signed)
Separate handout for cervical traction unit.

## 2017-03-21 ENCOUNTER — Encounter: Payer: Self-pay | Admitting: Sports Medicine

## 2017-03-21 DIAGNOSIS — M479 Spondylosis, unspecified: Secondary | ICD-10-CM | POA: Insufficient documentation

## 2017-03-21 NOTE — Assessment & Plan Note (Signed)
Cervical traction unit at home discussed.  Very low-dose of steroid given his underlying osteoporosis.  But he is not a candidate for anti-inflammatory effects.  Suspect primary facet mediated pain.  Will trial low-dose baclofen.  Gentle cervical range of motion recommended at home

## 2017-03-31 DIAGNOSIS — N183 Chronic kidney disease, stage 3 (moderate): Secondary | ICD-10-CM | POA: Diagnosis not present

## 2017-03-31 DIAGNOSIS — M810 Age-related osteoporosis without current pathological fracture: Secondary | ICD-10-CM | POA: Diagnosis not present

## 2017-03-31 DIAGNOSIS — K760 Fatty (change of) liver, not elsewhere classified: Secondary | ICD-10-CM | POA: Diagnosis not present

## 2017-03-31 LAB — BASIC METABOLIC PANEL
BUN: 41 — AB (ref 4–21)
Creatinine: 2.3 — AB (ref 0.6–1.3)
GLUCOSE: 91
Potassium: 4.6 (ref 3.4–5.3)
Sodium: 139 (ref 137–147)

## 2017-04-05 ENCOUNTER — Encounter: Payer: Self-pay | Admitting: Family Medicine

## 2017-04-20 ENCOUNTER — Ambulatory Visit (INDEPENDENT_AMBULATORY_CARE_PROVIDER_SITE_OTHER): Payer: Medicare Other | Admitting: Sports Medicine

## 2017-04-20 ENCOUNTER — Other Ambulatory Visit: Payer: Self-pay | Admitting: Sports Medicine

## 2017-04-20 ENCOUNTER — Encounter: Payer: Self-pay | Admitting: Sports Medicine

## 2017-04-20 VITALS — BP 132/78 | HR 94 | Ht 63.0 in | Wt 197.0 lb

## 2017-04-20 DIAGNOSIS — N183 Chronic kidney disease, stage 3 unspecified: Secondary | ICD-10-CM

## 2017-04-20 DIAGNOSIS — M109 Gout, unspecified: Secondary | ICD-10-CM

## 2017-04-20 DIAGNOSIS — R899 Unspecified abnormal finding in specimens from other organs, systems and tissues: Secondary | ICD-10-CM | POA: Diagnosis not present

## 2017-04-20 DIAGNOSIS — M47812 Spondylosis without myelopathy or radiculopathy, cervical region: Secondary | ICD-10-CM

## 2017-04-20 DIAGNOSIS — M5030 Other cervical disc degeneration, unspecified cervical region: Secondary | ICD-10-CM | POA: Diagnosis not present

## 2017-04-20 DIAGNOSIS — M1000 Idiopathic gout, unspecified site: Secondary | ICD-10-CM | POA: Diagnosis not present

## 2017-04-20 DIAGNOSIS — M542 Cervicalgia: Secondary | ICD-10-CM | POA: Diagnosis not present

## 2017-04-20 LAB — COMPREHENSIVE METABOLIC PANEL
ALK PHOS: 45 U/L (ref 39–117)
ALT: 9 U/L (ref 0–53)
AST: 13 U/L (ref 0–37)
Albumin: 4.1 g/dL (ref 3.5–5.2)
BUN: 45 mg/dL — AB (ref 6–23)
CO2: 25 mEq/L (ref 19–32)
Calcium: 9.3 mg/dL (ref 8.4–10.5)
Chloride: 105 mEq/L (ref 96–112)
Creatinine, Ser: 2.17 mg/dL — ABNORMAL HIGH (ref 0.40–1.50)
GFR: 31.48 mL/min — AB (ref >60.00)
GLUCOSE: 98 mg/dL (ref 70–99)
POTASSIUM: 4.3 meq/L (ref 3.5–5.1)
SODIUM: 136 meq/L (ref 135–145)
Total Bilirubin: 0.5 mg/dL (ref 0.2–1.2)
Total Protein: 6.7 g/dL (ref 6.0–8.3)

## 2017-04-20 LAB — CBC WITH DIFFERENTIAL/PLATELET
BASOS ABS: 0.1 10*3/uL (ref 0.0–0.1)
Basophils Relative: 1 % (ref 0.0–3.0)
EOS PCT: 3.7 % (ref 0.0–5.0)
Eosinophils Absolute: 0.2 10*3/uL (ref 0.0–0.7)
HEMATOCRIT: 37.5 % — AB (ref 39.0–52.0)
Hemoglobin: 12.7 g/dL — ABNORMAL LOW (ref 13.0–17.0)
LYMPHS ABS: 1.2 10*3/uL (ref 0.7–4.0)
LYMPHS PCT: 18.2 % (ref 12.0–46.0)
MCHC: 33.9 g/dL (ref 30.0–36.0)
MCV: 95.7 fl (ref 78.0–100.0)
Monocytes Absolute: 0.6 10*3/uL (ref 0.1–1.0)
Monocytes Relative: 9.8 % (ref 3.0–12.0)
NEUTROS ABS: 4.4 10*3/uL (ref 1.4–7.7)
Neutrophils Relative %: 67.3 % (ref 43.0–77.0)
PLATELETS: 194 10*3/uL (ref 150.0–400.0)
RBC: 3.92 Mil/uL — ABNORMAL LOW (ref 4.22–5.81)
RDW: 12.2 % (ref 11.5–15.5)
WBC: 6.5 10*3/uL (ref 4.0–10.5)

## 2017-04-20 LAB — URIC ACID: Uric Acid, Serum: 7.9 mg/dL — ABNORMAL HIGH (ref 4.0–7.8)

## 2017-04-20 MED ORDER — FEBUXOSTAT 40 MG PO TABS: 40.0000 mg | ORAL_TABLET | Freq: Every day | ORAL | 1 refills | Status: DC

## 2017-04-20 NOTE — Assessment & Plan Note (Signed)
We will recheck uric acid level since it has been recommended in the past that he be on uric acid lowering agents but he is adamant that he has been told by [name] that he does not have gout although this is not what [name] note would indicate.  We will likely recommend that we return to [name] office once we have the results back given the abnormal anti-CCP and severe symptoms.

## 2017-04-20 NOTE — Progress Notes (Signed)
[name]. [name], [place] at [facility] [phone]  [name] - 77 y.o. male MRN [id number]  Date of birth: 1941-03-07  Visit Date: 04/20/2017  PCP: [name], MD   Referred by: [name], MD   Scribe for today's visit: [name], LAT, ATC     SUBJECTIVE:  [name] is here for Follow-up (neck pain) .   Info from initial visit on 12/19/18L  His neck pain symptoms INITIALLY: Began about 2-3 weeks w/ no MOI noted. Described as a constant 6/10 aching pain w/ no pain radiating into his UEs. Worsened with movement, particularly w/ rotation Improved with: nothing noted Additional associated symptoms include: no radiating pain into B UEs and no N/T tingling    At this time symptoms are worsening compared to onset with increased pain noted. He has been taking Tylenol (2 pills bid).   Compared to the last office visit on 03/09/17, his previously described neck pain symptoms are continuing to worsen w/ increased pain Current symptoms are severe & are nonradiating. He has been getting accupuncture at [facility] w/ [name] and feels like the accupuncture may have made him worse.  Also c/o L foot pain w/ pain that travels to different locations in the foot.  Pt reports no gout.  Sharp pain.  No N/T noted.   ROS Denies night time disturbances. Denies fevers, chills, or night sweats. Denies unexplained weight loss. Denies personal history of cancer. Denies changes in bowel or bladder habits. Denies recent unreported falls. Denies new or worsening dyspnea or wheezing. Denies headaches or dizziness.  Denies numbness, tingling or weakness  In the extremities.  Denies dizziness or presyncopal episodes Denies lower extremity edema     HISTORY & PERTINENT PRIOR DATA:  Prior History reviewed and updated per electronic medical record.  Significant history, findings, studies and interim changes include:  reports that  has never smoked. he has never used smokeless tobacco. No results for input(s): HGBA1C, LABURIC, CREATINE in the last 8760 hours. [name] - Rheumatology - has not followed up [place] for [name]  Osteoarthritis of Spine  Abnormal Laboratory Test Result   Has been seen by [name] and recommended to be on urate lowering therapy.      Lab 08/2016  RF Negative  Anti-CCP Positive - 30  Uric Acid 7.3     Gout   Slightly high uric acid. Saw rheumatology- not sure this was gout. Still put on uric acid lowering agent uloric but he later stopped  Has been seen by [name] and recommended to be on urate lowering therapy.   Has not been taking Uloric or colchicine at this time.   Did not tolerate allopurinol in the past.   Previous uric acid level was 7.3 in 08/2016.     OBJECTIVE:  VS:  HT:5\' 3"  (160 cm)   WT:197 lb (89.4 kg)  BMI:34.91    BP:132/78  HR:94bpm  TEMP: ( )  RESP:95 %   PHYSICAL EXAM: Constitutional: WDWN, Non-toxic appearing. Psychiatric: Moderate insight Not depressed or anxious appearing. Respiratory: No increased work of breathing.  Trachea Midline Eyes: Pupils are equal.  EOM intact without nystagmus.  No scleral icterus  NEUROVASCULAR exam: No clubbing or cyanosis appreciated No significant venous stasis changes Capillary Refill: normal, less than 2 seconds   UPPER Extremities   Generalized UE Edema: No significant edema Radial Pulses: Normal & symmetrically palpable  Dermatomes intact to light touch Normal strength in all myotomes Reflexes: Normal & symmetric DTRs     No significant pain with brachial plexus squeeze her arm squeeze test.  Pain over the posterior aspect of the neck bilaterally over the facets.  No midline pain.  No pain Spurling's compression test or brachial plexus squeeze or arm squeeze test No additional findings.   ASSESSMENT & PLAN:   1. Idiopathic gout,  unspecified chronicity, unspecified site   2. Other cervical disc degeneration, unspecified cervical region   3. Cervicalgia   4. Spondylosis of cervical region without myelopathy or radiculopathy   5. Chronic kidney disease (CKD), stage III (moderate) (HCC)   6. Acute gout of left ankle, unspecified cause   7. Abnormal laboratory test result    PLAN:    Osteoarthritis of spine Symptoms are consistent with facet mediated pain likely partial ankylosis of the cervical spine.  He is under normal measures without significant improvement and is having continued symptoms.  Further diagnostic evaluation MRI indicated in order today.  Gout We will recheck uric acid level since it has been recommended in the past that he be on uric acid lowering agents but he is adamant that he has been told by [name] that he does not have gout although this is not what [name] note would indicate.  We will likely recommend that we return to [name] office once we have the results back given the abnormal anti-CCP and severe symptoms.  Abnormal laboratory test result See overview.  We will need to discuss return once further diagnostic evaluation completed including MRI.   ++++++++++++++++++++++++++++++++++++++++++++ Orders & Meds: Orders Placed This Encounter  Procedures  . MR Cervical Spine Wo Contrast  . Comprehensive metabolic panel  . Uric acid  . CBC with Differential/Platelet    No orders of the defined types were placed in this encounter.   ++++++++++++++++++++++++++++++++++++++++++++ Follow-up: Return for MRI results review.   Pertinent documentation may be included in additional procedure notes, imaging studies, problem based documentation and patient instructions. Please see these sections of the encounter for additional information regarding this visit. CMA/ATC served as Education administrator during this visit. History, Physical, and Plan performed by medical provider. Documentation and orders  reviewed and attested to.      [name], [place] [facility] Physician

## 2017-04-20 NOTE — Assessment & Plan Note (Signed)
Symptoms are consistent with facet mediated pain likely partial ankylosis of the cervical spine.  He is under normal measures without significant improvement and is having continued symptoms.  Further diagnostic evaluation MRI indicated in order today.

## 2017-04-20 NOTE — Patient Instructions (Signed)

## 2017-04-20 NOTE — Progress Notes (Signed)
Called pt and informed him of his uric acid level results and that Dr. Paulla Fore has put in a prescription for Uloric to his preferred pharmacy.

## 2017-04-20 NOTE — Assessment & Plan Note (Signed)
See overview.  We will need to discuss return once further diagnostic evaluation completed including MRI.

## 2017-04-20 NOTE — Progress Notes (Signed)
Patient's uric acid level is elevated still above what is ideal and is likely contributing to some of his pain.  He would benefit from uric acid lowering medications and we will send in Uloric.  Please call and inform him of these findings.  We can discuss them further at his follow-up appointment after the MRI

## 2017-04-21 ENCOUNTER — Telehealth: Payer: Self-pay | Admitting: Family Medicine

## 2017-04-21 NOTE — Telephone Encounter (Signed)
Okay to call to whatever pharmacy he would like.  This is not a critical medicine to be on from an acute standpoint so it is okay to send in to mail order pharmacy.

## 2017-04-21 NOTE — Telephone Encounter (Signed)
Copied from [place]. Topic: Inquiry >> Apr 21, 2017 11:30 AM Cecelia Byars, NT wrote: Reason for CRM: Patient called to follow up ,to see his medication for uric  acid was called in to the [facility] on [address]  [phone] , patients unsure of the name of the medication

## 2017-04-21 NOTE — Telephone Encounter (Signed)
Medication was sent in on 04/20/17 by Dr. Teresa Coombs. It was sent to [facility] [place].  Is [facility] a specialty pharmacy or can medication be called into [facility]?

## 2017-04-22 ENCOUNTER — Ambulatory Visit: Payer: Medicare Other | Admitting: Sports Medicine

## 2017-04-22 NOTE — Telephone Encounter (Signed)
MRI does not require PA. Order has been sent to [facility], they should be contacting him to schedule MRI. If he has not heard from their office he can give them a call to schedule at [phone]. Claled pt and left VM to call the office.

## 2017-04-22 NOTE — Telephone Encounter (Signed)
Please advise 

## 2017-04-22 NOTE — Telephone Encounter (Signed)
Copied from [place] [id number]. Topic: Referral - Status >> Apr 22, 2017  3:24 PM Neva Seat wrote:  Pt hasn't had a call back regarding his MRI.  Pt checking on the status of the appt.

## 2017-04-24 ENCOUNTER — Ambulatory Visit
Admission: RE | Admit: 2017-04-24 | Discharge: 2017-04-24 | Disposition: A | Discharge status: Home / Self Care | Payer: Medicare Other | Source: Ambulatory Visit | Attending: Sports Medicine | Admitting: Sports Medicine

## 2017-04-24 DIAGNOSIS — M5030 Other cervical disc degeneration, unspecified cervical region: Secondary | ICD-10-CM

## 2017-04-24 DIAGNOSIS — M5010 Cervical disc disorder with radiculopathy, unspecified cervical region: Secondary | ICD-10-CM | POA: Diagnosis not present

## 2017-04-24 DIAGNOSIS — M542 Cervicalgia: Secondary | ICD-10-CM

## 2017-04-25 ENCOUNTER — Other Ambulatory Visit: Payer: Self-pay

## 2017-04-25 MED ORDER — FEBUXOSTAT 40 MG PO TABS: 40.0000 mg | ORAL_TABLET | Freq: Every day | ORAL | 1 refills | Status: DC

## 2017-04-25 NOTE — Telephone Encounter (Signed)
Looks like MRI was done 04/24/17, pt will need to schedule OV to discuss results and treatment recommendations.

## 2017-04-25 NOTE — Telephone Encounter (Signed)
OV has been scheduled for MRI review 04/27/17. Uloric has been sent to [facility] per patient request.

## 2017-04-27 ENCOUNTER — Ambulatory Visit: Payer: Medicare Other | Admitting: Sports Medicine

## 2017-04-27 ENCOUNTER — Encounter: Payer: Self-pay | Admitting: Sports Medicine

## 2017-04-27 VITALS — BP 140/74 | HR 90 | Ht 63.0 in | Wt 197.4 lb

## 2017-04-27 DIAGNOSIS — M1000 Idiopathic gout, unspecified site: Secondary | ICD-10-CM | POA: Diagnosis not present

## 2017-04-27 DIAGNOSIS — M47812 Spondylosis without myelopathy or radiculopathy, cervical region: Secondary | ICD-10-CM | POA: Diagnosis not present

## 2017-04-27 DIAGNOSIS — N183 Chronic kidney disease, stage 3 unspecified: Secondary | ICD-10-CM

## 2017-04-27 DIAGNOSIS — M5030 Other cervical disc degeneration, unspecified cervical region: Secondary | ICD-10-CM

## 2017-04-27 NOTE — Assessment & Plan Note (Addendum)
Significant cervical spondylosis.  He currently has very minimal nonaxial pain and I believe the majority of his symptoms are coming from facet mediated pain.  I would like to refer him to Dr. Maryjean Ka with [facility] for his opinion regarding potential interventions.  If there is any delay in getting him scheduled we can consider a systemic steroid but given his other comorbidities I would like to try to avoid systemic treatment if possible.  Question whether empiric epidural steroid injection versus targeted medial branch blocks/facet injections with consideration of RFA in the future would be more appropriate.  May want to consider starting with epidural steroid and seeing his response with this but will defer to Dr. Maryjean Ka expertise.  Unfortunately I do not think from a surgical standpoint there would be anything straightforward that would provide him significant improvement in he would likely be looking at a multilevel fusion which he is not interested in undergoing at this time.  Obviously if any lack of improvement or worsening symptoms neurosurgical eval can be considered

## 2017-04-27 NOTE — Assessment & Plan Note (Signed)
Will need to have uric acid and creatinine levels monitored. Start Uloric 40 mg

## 2017-04-27 NOTE — Progress Notes (Signed)
  OBJECTIVE:  VS:  HT:5\' 3"  (160 cm)   WT:197 lb 6.4 oz (89.5 kg)  BMI:34.98    BP:140/74  HR:90bpm  TEMP: ( )  RESP:98 %   PHYSICAL EXAM: Constitutional: WDWN, Non-toxic appearing. Psychiatric: Alert & appropriately interactive.  Not depressed or anxious appearing. Respiratory: No increased work of breathing.  Trachea Midline Eyes: Pupils are equal.  EOM intact without nystagmus.  No scleral icterus  NEUROVASCULAR exam: No clubbing or cyanosis appreciated No significant venous stasis changes Capillary Refill: normal, less than 2 seconds   Neck:   Exaggerated cervical lordosis, no significant torticollis  Midline Bony TTP: none   Paraspinal Muscle Spasm: Yes, bilateral marked pain with palpation of bilateral cervical facets worse on the right than on the left.  CERVICAL ROM: Markedly limited cervical sidebending and rotation with only 10-15 degrees of rotation appreciated.  NEURAL TENSION SIGNS Right Left  Brachial Plexus Squeeze: normal, no pain normal, no pain  Arm Squeeze Test: normal, no pain normal, no pain  Spurling's Compression Test: positive, mild pain  Localizing to the right paracervical region. positive, mild pain  Lhermitte's Compression test: Negative, no radiating pain  MOTOR TESTING: Intact in all UE myotomes

## 2017-04-27 NOTE — Progress Notes (Signed)
[name]. [place], [place] at [facility] [id number]  Novak Stgermaine - 77 y.o. male MRN [id number]  Date of birth: Feb 11, 1941  Visit Date: 04/27/2017  PCP: [name], MD   Referred by: [name], MD   Scribe for today's visit: [name], LAT, ATC     SUBJECTIVE:  [name] is here for Follow-up (neck pain and MRI review) . Compared to the last office visit on 04/20/17, his previously described neck pain symptoms are worsening w/ increased pain Current symptoms are severe & are nonradiating He got a c-spine MRI on 04/25/17 and is here to review those results.  He has not picked up or started taking his Uloric yet due to a pharmacy mix-up but plans to pick it up today.   ROS Denies night time disturbances. Denies fevers, chills, or night sweats. Denies unexplained weight loss. Denies personal history of cancer. Denies changes in bowel or bladder habits. Denies recent unreported falls. Denies new or worsening dyspnea or wheezing. Denies headaches or dizziness.  Denies numbness, tingling or weakness  In the extremities.  Denies dizziness or presyncopal episodes Denies lower extremity edema     HISTORY & PERTINENT PRIOR DATA:  Prior History reviewed and updated per electronic medical record.  Significant history, findings, studies and interim changes include:  reports that  has never smoked. he has never used smokeless tobacco. Recent Labs    04/20/17 1041  LABURIC 7.9*   [name] - Rheumatology - has not followed up [place] for [name]  Osteoarthritis of Spine  Gout   Slightly high uric acid. Saw rheumatology- not sure this was gout. Still put on uric acid lowering agent uloric but he later stopped  Has been seen by [name] and recommended to be on urate lowering therapy.   Has not been taking Uloric or colchicine at this time.   Did not tolerate  allopurinol in the past.   Previous uric acid level was 7.3 in 08/2016.   Chronic kidney disease (CKD), stage III (moderate) (HCC)   Due to Lithium. Dr. Florene Glen at least yearly     ASSESSMENT & PLAN:   1. Other cervical disc degeneration, unspecified cervical region   2. Idiopathic gout, unspecified chronicity, unspecified site   3. Spondylosis of cervical region without myelopathy or radiculopathy   4. Chronic kidney disease (CKD), stage III (moderate) (HCC)    PLAN:    Gout His recent levels are 7.9 consistent with hyperuricemia.  Needs to be on uric acid lowering medications. Uloric has been sent and and is available for him to pick up today.  We will have him begin on this and recheck his blood work in 10 weeks.  If stable at 10 weeks and appropriate levels will defer to Dr. Yong Channel for ongoing management.  Of note I did spend time he does have a definitive diagnosis of gout and this is likely contributing to some of the polyarthralgia complaints that he has.  Anticipate small improvements in his symptoms but discussed that this is likely going to be disease modifying as opposed to symptoms resolving.  Chronic kidney disease (CKD), stage III (moderate) (HCC) Will need to have uric acid and creatinine levels monitored. Start Uloric 40 mg   Osteoarthritis of spine Significant cervical spondylosis.  He currently has very minimal nonaxial pain and I believe the majority of his symptoms are coming from facet mediated pain.  I would like to refer him to Dr. Maryjean Ka with [facility] for his opinion regarding potential interventions.  If there is any delay in getting him scheduled we can consider a systemic steroid but given his other comorbidities I would like to try to avoid systemic treatment if possible.  Question whether empiric epidural steroid injection versus targeted medial branch blocks/facet injections with consideration of RFA in the future would be more appropriate.  May  want to consider starting with epidural steroid and seeing his response with this but will defer to Dr. Maryjean Ka expertise.  Unfortunately I do not think from a surgical standpoint there would be anything straightforward that would provide him significant improvement in he would likely be looking at a multilevel fusion which he is not interested in undergoing at this time.  Obviously if any lack of improvement or worsening symptoms neurosurgical eval can be considered   ++++++++++++++++++++++++++++++++++++++++++++ Orders & Meds: Orders Placed This Encounter  Procedures  . Ambulatory referral to Neurosurgery    No orders of the defined types were placed in this encounter.   ++++++++++++++++++++++++++++++++++++++++++++ Follow-up: Return in about 10 weeks (around 07/06/2017).   Pertinent documentation may be included in additional procedure notes, imaging studies, problem based documentation and patient instructions. Please see these sections of the encounter for additional information regarding this visit. CMA/ATC served as Education administrator during this visit. History, Physical, and Plan performed by medical provider. Documentation and orders reviewed and attested to.      [name], [facility] Physician

## 2017-04-27 NOTE — Assessment & Plan Note (Addendum)
His recent levels are 7.9 consistent with hyperuricemia.  Needs to be on uric acid lowering medications. Uloric has been sent and and is available for him to pick up today.  We will have him begin on this and recheck his blood work in 10 weeks.  If stable at 10 weeks and appropriate levels will defer to Dr. Yong Channel for ongoing management.  Of note I did spend time he does have a definitive diagnosis of gout and this is likely contributing to some of the polyarthralgia complaints that he has.  Anticipate small improvements in his symptoms but discussed that this is likely going to be disease modifying as opposed to symptoms resolving.

## 2017-04-29 ENCOUNTER — Telehealth: Payer: Self-pay | Admitting: Family Medicine

## 2017-04-29 NOTE — Telephone Encounter (Signed)
Updated referral.

## 2017-04-29 NOTE — Telephone Encounter (Signed)
Copied from [place]. Topic: Quick Communication - See Telephone Encounter >> Apr 29, 2017  9:53 AM Robina Ade, Helene Kelp D wrote: CRM for notification. See Telephone encounter for: 04/29/17. Raquel Sarna from [facility] called and would like to give a update on patient. Let provider know that he has on 05/17/17 with them.

## 2017-05-17 DIAGNOSIS — M5126 Other intervertebral disc displacement, lumbar region: Secondary | ICD-10-CM | POA: Diagnosis not present

## 2017-05-17 DIAGNOSIS — M542 Cervicalgia: Secondary | ICD-10-CM | POA: Diagnosis not present

## 2017-05-17 DIAGNOSIS — R030 Elevated blood-pressure reading, without diagnosis of hypertension: Secondary | ICD-10-CM | POA: Diagnosis not present

## 2017-05-19 DIAGNOSIS — M47812 Spondylosis without myelopathy or radiculopathy, cervical region: Secondary | ICD-10-CM | POA: Diagnosis not present

## 2017-05-19 DIAGNOSIS — M542 Cervicalgia: Secondary | ICD-10-CM | POA: Diagnosis not present

## 2017-05-23 DIAGNOSIS — H2513 Age-related nuclear cataract, bilateral: Secondary | ICD-10-CM | POA: Diagnosis not present

## 2017-05-23 DIAGNOSIS — H04123 Dry eye syndrome of bilateral lacrimal glands: Secondary | ICD-10-CM | POA: Diagnosis not present

## 2017-05-23 DIAGNOSIS — H43813 Vitreous degeneration, bilateral: Secondary | ICD-10-CM | POA: Diagnosis not present

## 2017-05-23 DIAGNOSIS — H5213 Myopia, bilateral: Secondary | ICD-10-CM | POA: Diagnosis not present

## 2017-05-23 DIAGNOSIS — H5000 Unspecified esotropia: Secondary | ICD-10-CM | POA: Diagnosis not present

## 2017-06-09 DIAGNOSIS — M47816 Spondylosis without myelopathy or radiculopathy, lumbar region: Secondary | ICD-10-CM | POA: Diagnosis not present

## 2017-06-22 ENCOUNTER — Encounter: Payer: Self-pay | Admitting: Family Medicine

## 2017-06-22 ENCOUNTER — Other Ambulatory Visit: Payer: Self-pay | Admitting: Sports Medicine

## 2017-06-22 ENCOUNTER — Ambulatory Visit (INDEPENDENT_AMBULATORY_CARE_PROVIDER_SITE_OTHER): Payer: Medicare Other | Admitting: Family Medicine

## 2017-06-22 VITALS — BP 126/82 | HR 98 | Temp 97.7°F | Ht 63.0 in | Wt 191.8 lb

## 2017-06-22 DIAGNOSIS — D692 Other nonthrombocytopenic purpura: Secondary | ICD-10-CM | POA: Diagnosis not present

## 2017-06-22 DIAGNOSIS — Z0000 Encounter for general adult medical examination without abnormal findings: Secondary | ICD-10-CM | POA: Diagnosis not present

## 2017-06-22 DIAGNOSIS — R351 Nocturia: Secondary | ICD-10-CM | POA: Diagnosis not present

## 2017-06-22 DIAGNOSIS — N183 Chronic kidney disease, stage 3 unspecified: Secondary | ICD-10-CM

## 2017-06-22 DIAGNOSIS — M1000 Idiopathic gout, unspecified site: Secondary | ICD-10-CM

## 2017-06-22 DIAGNOSIS — M810 Age-related osteoporosis without current pathological fracture: Secondary | ICD-10-CM

## 2017-06-22 DIAGNOSIS — F3131 Bipolar disorder, current episode depressed, mild: Secondary | ICD-10-CM | POA: Diagnosis not present

## 2017-06-22 DIAGNOSIS — R739 Hyperglycemia, unspecified: Secondary | ICD-10-CM

## 2017-06-22 DIAGNOSIS — K760 Fatty (change of) liver, not elsewhere classified: Secondary | ICD-10-CM

## 2017-06-22 NOTE — Assessment & Plan Note (Signed)
Gout- now on uloric 40mg  through Dr. Paulla Fore- will update uric acid. Gave low purine diet and went over this from avs

## 2017-06-22 NOTE — Assessment & Plan Note (Signed)
Osteoporosis- was to discuss prolia with Dr. Florene Glen. Need to update dexa and may need to reach out to [facility] to see if they are ok with using this. Last worst t score was -2.9

## 2017-06-22 NOTE — Assessment & Plan Note (Signed)
Fatty liver- down another 6 lbs from January- really doing well with healthy eating. Last LFT looks great. a1c 5.7 in past- update this today- suspect risk lower with weight loss- congratulated on efforts Lab Results  Component Value Date   ALT 9 04/20/2017   AST 13 04/20/2017   ALKPHOS 45 04/20/2017   BILITOT 0.5 04/20/2017

## 2017-06-22 NOTE — Assessment & Plan Note (Signed)
Bipolar I with depressed mood- on abilify, parnate, methylfolate forte through Dr. Casimiro Needle psychiatyr. phq9 today of 2- denies mania episodes

## 2017-06-22 NOTE — Assessment & Plan Note (Signed)
CKD III- GFR just above 30. Sees [facility] Dr. Florene Glen every 6 months. Knows to avoid nsaids. Lithium may have played a part as has been on long term

## 2017-06-22 NOTE — Progress Notes (Signed)
Phone: [phone]  Subjective:  Patient presents today for their annual physical. Chief complaint-noted.   See problem oriented charting- ROS- full  review of systems was completed and negative except for: back pain, bruising easily  The following were reviewed and entered/updated in epic: Past Medical History:  Diagnosis Date  . Arthritis    back  . Back pain    reason unknown  . Benign prostatic hyperplasia (BPH) with urinary urge incontinence    sees Dr. Jeffie Pollock   . Bipolar 1 disorder [facility])    sees Dr. Norma Fredrickson  . CKD (chronic kidney disease)    (Dr. Erling Cruz)  . Depression    takes Parnate daily  . Diverticulitis of colon 05/27/2015  . Essential hypertension 04/11/2007   Lasix in the past. Now controlled without meds   . Headache(784.0)    occasionally  . History of colon polyps   . Hypothyroidism 10/03/2009   Noted by Dr. Arnoldo Morale- patient states never on medicine- may have been checked due to parnate and abilify   . 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 Joint pain   . Joint swelling   . Osteoporosis    takes Drisdol weekly and Caltrate daily  . Peripheral edema    takes Lasix daily   . Shingles 05/27/2015  . Sleep apnea    wears c-pap  . Swelling    right hand   Patient Active Problem List   Diagnosis Date Noted  . Mild depressed bipolar I disorder ([place]) 09/21/2006    Priority: High  . Chronic low back pain 03/08/2017    Priority: Medium  . Gout 07/07/2016    Priority: Medium  . Fatty liver 10/09/2014    Priority: Medium  . Obstructive sleep apnea 10/30/2008    Priority: Medium  . Osteoporosis 09/25/2008    Priority: Medium  . Chronic kidney disease (CKD), stage III (moderate) (HCC) 03/01/2008    Priority: Medium  . Senile purpura ([place]) 06/22/2017    Priority: Low  . Hypersomnolence 08/20/2016    Priority: Low  . Constipation 01/13/2016    Priority: Low  . Primary osteoarthritis of  right knee 12/08/2015    Priority: Low  . Gallstones 10/09/2014    Priority: Low  . Bilateral lower extremity edema 09/25/2014    Priority: Low  . Chronic venous insufficiency 08/29/2013    Priority: Low  . Right shoulder pain 08/29/2013    Priority: Low  . Osteoarthritis of spine 03/21/2017  . Abnormal laboratory test result 03/09/2017   Past Surgical History:  Procedure Laterality Date  . COLONOSCOPY W/ POLYPECTOMY  03-10-15   per Dr. Havery Moros, adenomatous polyps, repeat in 3 yrs   . REVERSE SHOULDER ARTHROPLASTY Right 05/22/2013   Procedure: REVERSE SHOULDER ARTHROPLASTY;  Surgeon: Nita Sells, MD;  Location: [facility];  Service: Orthopedics;  Laterality: Right;  Right reverse total shoulder  . TONSILLECTOMY    . TOTAL KNEE ARTHROPLASTY Right 12/08/2015   Procedure: TOTAL KNEE ARTHROPLASTY;  Surgeon: Dorna Leitz, MD;  Location: [facility];  Service: Orthopedics;  Laterality: Right;    Family History  Problem Relation Age of Onset  . Cancer Mother        unknown type  . Stroke Father   . Cancer Maternal Grandmother   . Appendicitis Paternal Grandfather   . Prostate cancer Brother   . Colon cancer Neg Hx   . Esophageal cancer Neg Hx   .  Rectal cancer Neg Hx   . Stomach cancer Neg Hx     Medications- reviewed and updated Current Outpatient Medications  Medication Sig Dispense Refill  . ARIPiprazole (ABILIFY) 5 MG tablet Take 1 tablet (5 mg total) by mouth daily. (Patient taking differently: Take 2.5 mg by mouth daily. ) 90 tablet 3  . Calcium-Vitamin D (CALTRATE 600 PLUS-VIT D PO) Take 1 tablet by mouth daily.     . Cholecalciferol (PA VITAMIN D-3 GUMMY PO) Take 2 tablets by mouth 2 (two) times daily.    . febuxostat (ULORIC) 40 MG tablet Take 1 tablet (40 mg total) by mouth daily. 30 tablet 1  . L-Methylfolate-Algae (L-METHYLFOLATE FORTE) 15-90.314 MG CAPS Take 1 capsule by mouth daily.  0  . tranylcypromine (PARNATE) 10 MG tablet Take 30 mg by mouth 2 (two) times  daily.      No current facility-administered medications for this visit.     Allergies-reviewed and updated No Active Allergies  Social History   Social History Narrative   GF Luther Redo. 2 sons, 1 daughter from prior relationship      Retired from [employer] traffic [employer] business in [place]: movies, time around the house, out for dinner    Objective: BP 126/82   Pulse 98   Temp 97.7 F (36.5 C) (Oral)   Ht 5\' 3"  (1.6 m)   Wt 191 lb 12.8 oz (87 kg)   SpO2 97%   BMI 33.98 kg/m  Gen: NAD, resting comfortably HEENT: Mucous membranes are moist. Oropharynx normal Neck: no thyromegaly CV: RRR no murmurs rubs or gallops Lungs: CTAB no crackles, wheeze, rhonchi Abdomen: soft/nontender/nondistended/normal bowel sounds. No rebound or guarding.  Ext: no edema Skin: warm, dry, bruising on hands noted Neuro: grossly normal, moves all extremities, PERRLA Rectal: normal tone, normal sized prostate, no masses or tenderness  Assessment/Plan:  77 y.o. male presenting for annual physical.  Health Maintenance counseling: 1. Anticipatory guidance: Patient counseled regarding regular dental exams -q6 months, eye exams -yearly- recently got new glasses, wearing seatbelts.  2. Risk factor reduction:  Advised patient of need for regular exercise and diet rich and fruits and vegetables to reduce risk of heart attack and stroke. Has eaten much better and continues to lose weight. Going ot the [facility] 2-3x a week.  Wt Readings from Last 3 Encounters:  06/22/17 191 lb 12.8 oz (87 kg)  04/27/17 197 lb 6.4 oz (89.5 kg)  04/20/17 197 lb (89.4 kg)  3. Immunizations/screenings/ancillary studies- discussed shingrix at pharmacy Immunization History  Administered Date(s) Administered  . Influenza Split 01/20/2011, 01/13/2012  . Influenza Whole 12/18/2007, 12/18/2008, 02/09/2010  . Influenza, High Dose Seasonal PF 01/19/2013, 01/10/2015, 01/13/2016, 03/08/2017  . Influenza,inj,Quad PF,6+ Mos  11/27/2013  . Pneumococcal Conjugate-13 10/09/2014  . Pneumococcal Polysaccharide-23 03/23/2006  . Td 11/11/2004, 06/08/2013  . Zoster 10/22/2010   4. Prostate cancer screening- brother with prostate cancer in 73s. He wants to pursue PSA and rectal exam- will trend today. Rectal low risk   Lab Results  Component Value Date   PSA 1.04 05/05/2016   PSA 0.62 05/07/2015   PSA 0.86 05/19/2011   5. Colon cancer screening - 03/10/15 with 3 year follow up due to precancerous polyps.  6. Skin cancer screening- sees Dr. Frederico Hamman in [facility]. advised regular sunscreen use. Denies worrisome, changing, or new skin lesions.   Status of chronic or acute concerns   Neck pain- referred to Dr. Paulla Fore in December. He has referred  patient to Dr. Maryjean Ka of [facility]- likely facet mediated disease and wonders if injections would benefit him. Nerve block in his neck was helpful and had a different injection in low back which didn't help as much.  Low back pain- has seen [facility] in past- now with Dr. Maryjean Ka and will follow up  Gout Gout- now on uloric 40mg  through Dr. Paulla Fore- will update uric acid. Gave low purine diet and went over this from avs  Fatty liver Fatty liver- down another 6 lbs from January- really doing well with healthy eating. Last LFT looks great. a1c 5.7 in past- update this today- suspect risk lower with weight loss- congratulated on efforts Lab Results  Component Value Date   ALT 9 04/20/2017   AST 13 04/20/2017   ALKPHOS 45 04/20/2017   BILITOT 0.5 04/20/2017     Senile purpura (HCC) Some easy bruising bleeding with age- likely senile purpura.   Osteoporosis Osteoporosis- was to discuss prolia with Dr. Florene Glen. Need to update dexa and may need to reach out to [facility] to see if they are ok with using this. Last worst t score was -2.9  Mild depressed bipolar I disorder (HCC) Bipolar I with depressed mood- on abilify, parnate, methylfolate forte  through Dr. Casimiro Needle psychiatyr. phq9 today of 2- denies mania episodes  Chronic kidney disease (CKD), stage III (moderate) (HCC) CKD III- GFR just above 30. Sees [facility] Dr. Florene Glen every 6 months. Knows to avoid nsaids. Lithium may have played a part as has been on long term  Future Appointments  Date Time Provider [place]  07/13/2017 11:00 AM [name], MD [facility]  can cancel then see me in 6 months from today  Lab/Order associations: Preventative health care - Plan: Hemoglobin A1c, CBC, Comprehensive metabolic panel, Lipid panel, PSA, Uric acid  Idiopathic gout, unspecified chronicity, unspecified site - Plan: Uric acid  Fatty liver - Plan: CBC, Comprehensive metabolic panel, Lipid panel  Age-related osteoporosis without current pathological fracture - Plan: DG Bone Density  Hyperglycemia - Plan: Hemoglobin A1c  Nocturia - Plan: PSA  Return precautions advised.  Garret Reddish, MD

## 2017-06-22 NOTE — Patient Instructions (Addendum)
Schedule your bone density test at check out desk. You may also call directly to X-ray at [phone] to schedule an appointment that is convenient for you.  - located [address] across the street from [place] - in the basement - you do need an appointment for the bone density tests.   When you stop by desk- ok to cancel the 07/13/17 visit and then schedule a visit 6 months from today for follow up  I would also like for you to sign up for an annual wellness visit with one of our nurses, [name] or [name], who both specialize in the annual wellness visit. This is a free benefit under medicare that may help [place] find additional ways to help you. Some highlights are reviewing medications, lifestyle, and doing a dementia screen.    If you can find shingrix at your pharmacy please get this  Schedule a lab visit at the check out desk within 2 weeks. Return for future fasting labs meaning nothing but water after midnight please. Ok to take your medications with water.     Low-Purine Diet Purines are compounds that affect the level of uric acid in your body. A low-purine diet is a diet that is low in purines. Eating a low-purine diet can prevent the level of uric acid in your body from getting too high and causing gout or kidney stones or both. What do I need to know about this diet?  Choose low-purine foods. Examples of low-purine foods are listed in the next section.  Drink plenty of fluids, especially water. Fluids can help remove uric acid from your body. Try to drink 8-16 cups (1.9-3.8 L) a day.  Limit foods high in fat, especially saturated fat, as fat makes it harder for the body to get rid of uric acid. Foods high in saturated fat include pizza, cheese, ice cream, whole milk, fried foods, and gravies. Choose foods that are lower in fat and lean sources of protein. Use olive oil when cooking as it contains healthy fats that are not high in saturated fat.  Limit alcohol. Alcohol  interferes with the elimination of uric acid from your body. If you are having a gout attack, avoid all alcohol.  Keep in mind that different people's bodies react differently to different foods. You will probably learn over time which foods do or do not affect you. If you discover that a food tends to cause your gout to flare up, avoid eating that food. You can more freely enjoy foods that do not cause problems. If you have any questions about a food item, talk to your dietitian or health care provider. Which foods are low, moderate, and high in purines? The following is a list of foods that are low, moderate, and high in purines. You can eat any amount of the foods that are low in purines. You may be able to have small amounts of foods that are moderate in purines. Ask your health care provider how much of a food moderate in purines you can have. Avoid foods high in purines. Grains  Foods low in purines: Enriched white bread, pasta, rice, cake, cornbread, popcorn.  Foods moderate in purines: Whole-grain breads and cereals, wheat germ, bran, oatmeal. Uncooked oatmeal. Dry wheat bran or wheat germ.  Foods high in purines: Pancakes, [place] toast, biscuits, muffins. Vegetables  Foods low in purines: All vegetables, except those that are moderate in purines.  Foods moderate in purines: Asparagus, cauliflower, spinach, mushrooms, green peas. Fruits  All fruits are low in purines. Meats and other Protein Foods  Foods low in purines: Eggs, nuts, peanut butter.  Foods moderate in purines: 80-90% lean beef, lamb, veal, pork, poultry, fish, eggs, peanut butter, nuts. Crab, lobster, oysters, and shrimp. Cooked dried beans, peas, and lentils.  Foods high in purines: Anchovies, sardines, herring, mussels, tuna, codfish, scallops, trout, and haddock. [name]. Organ meats (such as liver or kidney). Tripe. Game meat. Goose. Sweetbreads. Dairy  All dairy foods are low in purines. Low-fat and fat-free  dairy products are best because they are low in saturated fat. Beverages  Drinks low in purines: Water, carbonated beverages, tea, coffee, cocoa.  Drinks moderate in purines: Soft drinks and other drinks sweetened with high-fructose corn syrup. Juices. To find whether a food or drink is sweetened with high-fructose corn syrup, look at the ingredients list.  Drinks high in purines: Alcoholic beverages (such as beer). Condiments  Foods low in purines: Salt, herbs, olives, pickles, relishes, vinegar.  Foods moderate in purines: Butter, margarine, oils, mayonnaise. Fats and Oils  Foods low in purines: All types, except gravies and sauces made with meat.  Foods high in purines: Gravies and sauces made with meat. Other Foods  Foods low in purines: Sugars, sweets, gelatin. Cake. Soups made without meat.  Foods moderate in purines: Meat-based or fish-based soups, broths, or bouillons. Foods and drinks sweetened with high-fructose corn syrup.  Foods high in purines: High-fat desserts (such as ice cream, cookies, cakes, pies, doughnuts, and chocolate). Contact your dietitian for more information on foods that are not listed here. This information is not intended to replace advice given to you by your health care provider. Make sure you discuss any questions you have with your health care provider. Document Released: 07/03/2010 Document Revised: 08/14/2015 Document Reviewed: 02/12/2013 Elsevier Interactive Patient Education  2017 [employer].

## 2017-06-22 NOTE — Assessment & Plan Note (Signed)
Some easy bruising bleeding with age- likely senile purpura.

## 2017-06-24 ENCOUNTER — Other Ambulatory Visit (INDEPENDENT_AMBULATORY_CARE_PROVIDER_SITE_OTHER): Payer: Medicare Other

## 2017-06-24 DIAGNOSIS — K760 Fatty (change of) liver, not elsewhere classified: Secondary | ICD-10-CM

## 2017-06-24 DIAGNOSIS — Z0000 Encounter for general adult medical examination without abnormal findings: Secondary | ICD-10-CM | POA: Diagnosis not present

## 2017-06-24 DIAGNOSIS — R739 Hyperglycemia, unspecified: Secondary | ICD-10-CM

## 2017-06-24 DIAGNOSIS — R351 Nocturia: Secondary | ICD-10-CM | POA: Diagnosis not present

## 2017-06-24 DIAGNOSIS — M1000 Idiopathic gout, unspecified site: Secondary | ICD-10-CM

## 2017-06-24 LAB — COMPREHENSIVE METABOLIC PANEL
ALBUMIN: 3.8 g/dL (ref 3.5–5.2)
ALK PHOS: 42 U/L (ref 39–117)
ALT: 13 U/L (ref 0–53)
AST: 15 U/L (ref 0–37)
BILIRUBIN TOTAL: 0.6 mg/dL (ref 0.2–1.2)
BUN: 33 mg/dL — AB (ref 6–23)
CO2: 29 mEq/L (ref 19–32)
Calcium: 9.7 mg/dL (ref 8.4–10.5)
Chloride: 106 mEq/L (ref 96–112)
Creatinine, Ser: 1.96 mg/dL — ABNORMAL HIGH (ref 0.40–1.50)
GFR: 35.39 mL/min — ABNORMAL LOW (ref >60.00)
GLUCOSE: 87 mg/dL (ref 70–99)
POTASSIUM: 4.5 meq/L (ref 3.5–5.1)
SODIUM: 140 meq/L (ref 135–145)
TOTAL PROTEIN: 6 g/dL (ref 6.0–8.3)

## 2017-06-24 LAB — LIPID PANEL
CHOLESTEROL: 154 mg/dL (ref 0–200)
HDL: 66 mg/dL (ref >39.00)
LDL Cholesterol: 74 mg/dL (ref 0–99)
NonHDL: 88.03
Total CHOL/HDL Ratio: 2
Triglycerides: 68 mg/dL (ref 0.0–149.0)
VLDL: 13.6 mg/dL (ref 0.0–40.0)

## 2017-06-24 LAB — HEMOGLOBIN A1C: HEMOGLOBIN A1C: 5.9 % (ref 4.6–6.5)

## 2017-06-24 LAB — CBC
HCT: 38.1 % — ABNORMAL LOW (ref 39.0–52.0)
Hemoglobin: 12.9 g/dL — ABNORMAL LOW (ref 13.0–17.0)
MCHC: 33.9 g/dL (ref 30.0–36.0)
MCV: 95.5 fl (ref 78.0–100.0)
Platelets: 212 10*3/uL (ref 150.0–400.0)
RBC: 3.98 Mil/uL — ABNORMAL LOW (ref 4.22–5.81)
RDW: 12.5 % (ref 11.5–15.5)
WBC: 6.8 10*3/uL (ref 4.0–10.5)

## 2017-06-24 LAB — URIC ACID: URIC ACID, SERUM: 5.2 mg/dL (ref 4.0–7.8)

## 2017-06-24 LAB — PSA: PSA: 0.84 ng/mL (ref 0.10–4.00)

## 2017-06-28 DIAGNOSIS — H43813 Vitreous degeneration, bilateral: Secondary | ICD-10-CM | POA: Diagnosis not present

## 2017-06-28 DIAGNOSIS — H04123 Dry eye syndrome of bilateral lacrimal glands: Secondary | ICD-10-CM | POA: Diagnosis not present

## 2017-06-28 DIAGNOSIS — L718 Other rosacea: Secondary | ICD-10-CM | POA: Diagnosis not present

## 2017-06-28 DIAGNOSIS — H5000 Unspecified esotropia: Secondary | ICD-10-CM | POA: Diagnosis not present

## 2017-06-28 DIAGNOSIS — H5213 Myopia, bilateral: Secondary | ICD-10-CM | POA: Diagnosis not present

## 2017-06-28 DIAGNOSIS — I8310 Varicose veins of unspecified lower extremity with inflammation: Secondary | ICD-10-CM | POA: Diagnosis not present

## 2017-06-28 DIAGNOSIS — L570 Actinic keratosis: Secondary | ICD-10-CM | POA: Diagnosis not present

## 2017-06-28 DIAGNOSIS — H2513 Age-related nuclear cataract, bilateral: Secondary | ICD-10-CM | POA: Diagnosis not present

## 2017-06-29 ENCOUNTER — Ambulatory Visit (INDEPENDENT_AMBULATORY_CARE_PROVIDER_SITE_OTHER)
Admission: RE | Admit: 2017-06-29 | Discharge: 2017-06-29 | Disposition: A | Discharge status: Home / Self Care | Payer: Medicare Other | Source: Ambulatory Visit | Attending: Family Medicine | Admitting: Family Medicine

## 2017-06-29 DIAGNOSIS — M810 Age-related osteoporosis without current pathological fracture: Secondary | ICD-10-CM | POA: Diagnosis not present

## 2017-07-04 ENCOUNTER — Telehealth: Payer: Self-pay

## 2017-07-04 NOTE — Telephone Encounter (Signed)
Called [facility] and left a message on Joann's (Dr. Florene Glen assistant) voicemail to call either myself or Roselyn Reef back.

## 2017-07-06 ENCOUNTER — Telehealth: Payer: Self-pay | Admitting: Sports Medicine

## 2017-07-06 NOTE — Telephone Encounter (Signed)
Copied from [place] [phone]. Topic: General - Other >> Jul 06, 2017  9:39 AM Carolyn Stare wrote:  Pt call to ask if he can stop taking the below med saying it is making him tired.   It is showing Dr Anastasia Fiedler the med   ULORIC 40 MG tablet

## 2017-07-07 NOTE — Telephone Encounter (Signed)
Spoke with patient and advised. He will f/u with PCP regarding fatigue.

## 2017-07-07 NOTE — Telephone Encounter (Signed)
Dr Paulla Fore, please advise.

## 2017-07-07 NOTE — Telephone Encounter (Signed)
I do jot recommend him stopping it. It is reducing his iris acid (gout) levels. Fatigue should be further addressed by his PCP.  There is usually minimal affect from this medicine from fatigue

## 2017-07-11 DIAGNOSIS — G8929 Other chronic pain: Secondary | ICD-10-CM | POA: Diagnosis not present

## 2017-07-11 DIAGNOSIS — M47812 Spondylosis without myelopathy or radiculopathy, cervical region: Secondary | ICD-10-CM | POA: Diagnosis not present

## 2017-07-11 DIAGNOSIS — M47816 Spondylosis without myelopathy or radiculopathy, lumbar region: Secondary | ICD-10-CM | POA: Diagnosis not present

## 2017-07-11 DIAGNOSIS — M79672 Pain in left foot: Secondary | ICD-10-CM | POA: Diagnosis not present

## 2017-07-12 DIAGNOSIS — M47816 Spondylosis without myelopathy or radiculopathy, lumbar region: Secondary | ICD-10-CM | POA: Diagnosis not present

## 2017-07-12 DIAGNOSIS — M545 Low back pain: Secondary | ICD-10-CM | POA: Diagnosis not present

## 2017-07-13 ENCOUNTER — Ambulatory Visit: Payer: Medicare Other | Admitting: Family Medicine

## 2017-07-14 ENCOUNTER — Other Ambulatory Visit: Payer: Self-pay

## 2017-07-14 ENCOUNTER — Telehealth: Payer: Self-pay

## 2017-07-14 DIAGNOSIS — Z79899 Other long term (current) drug therapy: Secondary | ICD-10-CM

## 2017-07-14 NOTE — Telephone Encounter (Signed)
Great thanks.  Let us get him started with Prolia.  Go ahead and recheck bmet at 2 and 6 weeks under high risk medication

## 2017-07-14 NOTE — Telephone Encounter (Signed)
[name] had put in a request for his insurance to approve the medication and then we will get him schedule for the prolia and labs. Ill put in the labs as future.

## 2017-07-14 NOTE — Telephone Encounter (Signed)
DeAnna from [facility] called and stated that Dr. Florene Glen is okay with patient starting on Prolia. He just wants [place] to monitor his calcium levels and make sure they doesn't become low.

## 2017-07-20 NOTE — Progress Notes (Signed)
Subjective:   [name] is a 77 y.o. male who presents for an Subsequent Medicare Annual Wellness Visit.  Reports health as Last OV 06/22/2017  Going to Dr. Luan Pulling [facility] [phone] Placed a call for result of MRI   Lives with someone  Can do shopping etc 2 level home with bedrooms upstairs    Diet Lipids ratio 2; improved  A1c 5.9 Trying to lose weight Breakfast has muffin and glass of ice tea Lunch; skip Supper fish and vegetables  bmi 33    Exercise Has been going to the gym;  Waiting to hear regarding MRI Was going to Dr. Berenice Primas before    Hearing Screening   125Hz  250Hz  500Hz  1000Hz  2000Hz  3000Hz  4000Hz  6000Hz  8000Hz   Right ear:     100      Left ear:     100      Vision Screening Comments: Eyes checked once a year Can see good with  New glasses Watching cataract  Dr. Bing Plume     There are no preventive care reminders to display for this patient.  Osteoporosis (takes Drisdol weekly and Caltrate) DExa 06/2017  -3.1 Janie in to discuss prolia and will check on the process    PSA 06/2017 Urology   Colonoscopy 02/2015 with 3 year fup and Due 02/2018 Expecting a call to schedule when due      Objective:    Today's Vitals   07/21/17 1545  BP: 110/60  Pulse: 98  SpO2: 92%  Weight: 193 lb (87.5 kg)  Height: 5\' 4"  (1.626 m)   Body mass index is 33.13 kg/m.  Advanced Directives 07/21/2017 11/27/2015 02/20/2015 05/23/2013 05/21/2013  Does Patient Have a Medical Advance Directive? Yes Yes Yes Patient has advance directive, copy not in chart Patient has advance directive, copy not in chart  Type of Advance Directive - Living will [place];Living will [place];Living will [place];Living will  Does patient want to make changes to medical advance directive? - No - Patient declined No - Patient declined - -  Copy of [place] in Chart? - No - copy requested No -  copy requested - Copy requested from family  Pre-existing out of facility DNR order (yellow form or pink MOST form) - - - - No    Current Medications (verified) Outpatient Encounter Medications as of 07/21/2017  Medication Sig  . ARIPiprazole (ABILIFY) 5 MG tablet Take 1 tablet (5 mg total) by mouth daily. (Patient taking differently: Take 2.5 mg by mouth daily. )  . Calcium-Vitamin D (CALTRATE 600 PLUS-VIT D PO) Take 1 tablet by mouth daily.   . Cholecalciferol (PA VITAMIN D-3 GUMMY PO) Take 2 tablets by mouth 2 (two) times daily.  Marland Kitchen L-Methylfolate-Algae (L-METHYLFOLATE FORTE) 15-90.314 MG CAPS Take 1 capsule by mouth daily.  Marland Kitchen tranylcypromine (PARNATE) 10 MG tablet Take 30 mg by mouth 2 (two) times daily.   Marland Kitchen ULORIC 40 MG tablet TAKE 1 TABLET(40 MG) BY MOUTH DAILY   No facility-administered encounter medications on file as of 07/21/2017.     Allergies (verified) Patient has no active allergies.   History: Past Medical History:  Diagnosis Date  . Arthritis    back  . Back pain    reason unknown  . Benign prostatic hyperplasia (BPH) with urinary urge incontinence    sees Dr. Jeffie Pollock   . Bipolar 1 disorder [facility])    sees Dr. Norma Fredrickson  . CKD (  chronic kidney disease)    (Dr. Erling Cruz)  . Depression    takes Parnate daily  . Diverticulitis of colon 05/27/2015  . Essential hypertension 04/11/2007   Lasix in the past. Now controlled without meds   . Headache(784.0)    occasionally  . History of colon polyps   . Hypothyroidism 10/03/2009   Noted by Dr. Arnoldo Morale- patient states never on medicine- may have been checked due to parnate and abilify   . 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 Joint pain   . Joint swelling   . Osteoporosis    takes Drisdol weekly and Caltrate daily  . Peripheral edema    takes Lasix daily   . Shingles 05/27/2015  . Sleep apnea    wears c-pap  . Swelling    right hand   Past Surgical  History:  Procedure Laterality Date  . COLONOSCOPY W/ POLYPECTOMY  03-10-15   per Dr. Havery Moros, adenomatous polyps, repeat in 3 yrs   . REVERSE SHOULDER ARTHROPLASTY Right 05/22/2013   Procedure: REVERSE SHOULDER ARTHROPLASTY;  Surgeon: Nita Sells, MD;  Location: [place];  Service: Orthopedics;  Laterality: Right;  Right reverse total shoulder  . TONSILLECTOMY    . TOTAL KNEE ARTHROPLASTY Right 12/08/2015   Procedure: TOTAL KNEE ARTHROPLASTY;  Surgeon: Dorna Leitz, MD;  Location: [place];  Service: Orthopedics;  Laterality: Right;   Family History  Problem Relation Age of Onset  . Cancer Mother        unknown type  . Stroke Father   . Cancer Maternal Grandmother   . Appendicitis Paternal Grandfather   . Prostate cancer Brother   . Colon cancer Neg Hx   . Esophageal cancer Neg Hx   . Rectal cancer Neg Hx   . Stomach cancer Neg Hx    Social History   Socioeconomic History  . Marital status: Divorced    Spouse name: Not on file  . Number of children: Not on file  . Years of education: Not on file  . Highest education level: Not on file  Occupational History  . Not on file  Social Needs  . Financial resource strain: Not on file  . Food insecurity:    Worry: Not on file    Inability: Not on file  . Transportation needs:    Medical: Not on file    Non-medical: Not on file  Tobacco Use  . Smoking status: Never Smoker  . Smokeless tobacco: Never Used  Substance and Sexual Activity  . Alcohol use: No    Alcohol/week: 0.0 oz  . Drug use: No  . Sexual activity: Yes  Lifestyle  . Physical activity:    Days per week: Not on file    Minutes per session: Not on file  . Stress: Not on file  Relationships  . Social connections:    Talks on phone: Not on file    Gets together: Not on file    Attends religious service: Not on file    Active member of club or organization: Not on file    Attends meetings of clubs or organizations: Not on file    Relationship status:  Not on file  Other Topics Concern  . Not on file  Social History Narrative   GF Luther Redo. 2 sons, 1 daughter from prior relationship      Retired from Air traffic controller business in [place]:  movies, time around the house, out for dinner   Tobacco Counseling Counseling given: Yes   Clinical Intake:   Activities of Daily Living No flowsheet data found.   Immunizations and Health Maintenance Immunization History  Administered Date(s) Administered  . Influenza Split 01/20/2011, 01/13/2012  . Influenza Whole 12/18/2007, 12/18/2008, 02/09/2010  . Influenza, High Dose Seasonal PF 01/19/2013, 01/10/2015, 01/13/2016, 03/08/2017  . Influenza,inj,Quad PF,6+ Mos 11/27/2013  . Pneumococcal Conjugate-13 10/09/2014  . Pneumococcal Polysaccharide-23 03/23/2006  . Td 11/11/2004, 06/08/2013  . Zoster 10/22/2010   There are no preventive care reminders to display for this patient.  Patient Care Team: [name], MD as PCP - General (Family Medicine)  Indicate any recent Medical Services you may have received from other than [facility] providers in the past year (date may be approximate).    Assessment:   This is a routine wellness examination for Claire.  Hearing/Vision screen  Hearing Screening   125Hz  250Hz  500Hz  1000Hz  2000Hz  3000Hz  4000Hz  6000Hz  8000Hz   Right ear:     100      Left ear:     100      Vision Screening Comments: Eyes checked once a year Can see good with  New glasses Watching cataract  Dr. Bing Plume   Dietary issues and exercise activities discussed:    Goals    . Weight (lb) < 180 lb (81.6 kg)     Trying to lose weight Keep trying to lose and eat right       Depression Screen PHQ 2/9 Scores 07/21/2017 06/22/2017 03/08/2017 09/25/2014  PHQ - 2 Score 0 0 0 0  PHQ- 9 Score - 2 - -    Depression is "ok" today   Fall Risk Fall Risk  07/21/2017 03/08/2017 09/25/2014 06/08/2013 05/08/2012  Falls in the past year? Yes No No Yes No  Number falls in past  yr: 1 - - 1 -  Injury with Fall? - - - Yes -  Risk for fall due to : Impaired balance/gait - - Medication side effect Impaired mobility  Follow up Education provided - - - -   Fell yesterday while working in the yard Was planting and using a shovel     Cognitive Function:  states he had "shock" treatments in the past which blocked his memory No failures in living Takes his medicine Manages his own finances  Still drives without incident      Screening Tests Health Maintenance  Topic Date Due  . INFLUENZA VACCINE  10/20/2017  . COLONOSCOPY  03/09/2018  . TETANUS/TDAP  06/09/2023  . PNA vac Low Risk Adult  Completed          Plan:      PCP Notes   Health Maintenance PSA 06/2017 Urology   Colonoscopy 02/2015 with 3 year fup and Due 02/2018  Jamie fup on prolia and agreed to take it today if insurance covers this  Roselyn Reef to fup with him next week once she hears back regarding coverage.   Abnormal Screens  Hearing 2000hz     Referrals  none   Patient concerns; Has not heard back from MRI; call placed to Dr. Luan Pulling office to fup with him by mobile with result.   Stated his uloric cost went up due to being in the doughnut. No generic at this time. Printed off a saving card today to help with cost    Nurse Concerns; As noted   Next PCP apt He was just seen in April  Apt  10//2019      I have personally reviewed and noted the following in the patient's chart:   . Medical and social history . Use of alcohol, tobacco or illicit drugs  . Current medications and supplements . Functional ability and status . Nutritional status . Physical activity . Advanced directives . List of other physicians . Hospitalizations, surgeries, and ER visits in previous 12 months . Vitals . Screenings to include cognitive, depression, and falls . Referrals and appointments  In addition, I have reviewed and discussed with patient certain preventive protocols, quality  metrics, and best practice recommendations. A written personalized care plan for preventive services as well as general preventive health recommendations were provided to patient.     Wynetta Fines, RN   07/21/2017

## 2017-07-21 ENCOUNTER — Ambulatory Visit (INDEPENDENT_AMBULATORY_CARE_PROVIDER_SITE_OTHER): Payer: Medicare Other | Admitting: *Deleted

## 2017-07-21 ENCOUNTER — Encounter: Payer: Self-pay | Admitting: *Deleted

## 2017-07-21 VITALS — BP 110/60 | HR 98 | Ht 64.0 in | Wt 193.0 lb

## 2017-07-21 DIAGNOSIS — Z0000 Encounter for general adult medical examination without abnormal findings: Secondary | ICD-10-CM | POA: Diagnosis not present

## 2017-07-21 NOTE — Progress Notes (Signed)
I have reviewed and agree with note, evaluation, plan.   Symone Cornman, MD  

## 2017-07-21 NOTE — Patient Instructions (Addendum)
Mr. Glazier , Thank you for taking time to come for your Medicare Wellness Visit. I appreciate your ongoing commitment to your health goals. Please review the following plan we discussed and let me know if I can assist you in the future.  I hope your uloric saving card will help you!   Will check with your sister regarding prolia, the injection given every 6 months for your bone loss. I am sure Dr. Yong Channel will discuss pros and cons with you if insurance approves it.  Roselyn Reef is checking on your benefit now   Will bring a copy of your HCPOA to the office  Will ask your pharmacist about the shingrix vaccinations. Shingrix is a vaccine for the prevention of Shingles in Adults 50 and older.  If you are on Medicare, the shingrix is covered under your Part D plan, so you will take both of the vaccines in the series at your pharmacy. Please check with your benefits regarding applicable copays or out of pocket expenses.  The Shingrix is given in 2 vaccines approx 8 weeks apart. You must receive the 2nd dose prior to 6 months from receipt of the first. Please have the pharmacist print out you Immunization  dates for our office records      These are the goals we discussed: Goals    . Weight (lb) < 180 lb (81.6 kg)     Trying to lose weight Keep trying to lose and eat right        This is a list of the screening recommended for you and due dates:  Health Maintenance  Topic Date Due  . Flu Shot  10/20/2017  . Colon Cancer Screening  03/09/2018  . Tetanus Vaccine  06/09/2023  . Pneumonia vaccines  Completed      Fall Prevention in the Home Falls can cause injuries. They can happen to people of all ages. There are many things you can do to make your home safe and to help prevent falls. What can I do on the outside of my home?  Regularly fix the edges of walkways and driveways and fix any cracks.  Remove anything that might make you trip as you walk through a door, such as a raised  step or threshold.  Trim any bushes or trees on the path to your home.  Use bright outdoor lighting.  Clear any walking paths of anything that might make someone trip, such as rocks or tools.  Regularly check to see if handrails are loose or broken. Make sure that both sides of any steps have handrails.  Any raised decks and porches should have guardrails on the edges.  Have any leaves, snow, or ice cleared regularly.  Use sand or salt on walking paths during winter.  Clean up any spills in your garage right away. This includes oil or grease spills. What can I do in the bathroom?  Use night lights.  Install grab bars by the toilet and in the tub and shower. Do not use towel bars as grab bars.  Use non-skid mats or decals in the tub or shower.  If you need to sit down in the shower, use a plastic, non-slip stool.  Keep the floor dry. Clean up any water that spills on the floor as soon as it happens.  Remove soap buildup in the tub or shower regularly.  Attach bath mats securely with double-sided non-slip rug tape.  Do not have throw rugs and other things on the floor that  can make you trip. What can I do in the bedroom?  Use night lights.  Make sure that you have a light by your bed that is easy to reach.  Do not use any sheets or blankets that are too big for your bed. They should not hang down onto the floor.  Have a firm chair that has side arms. You can use this for support while you get dressed.  Do not have throw rugs and other things on the floor that can make you trip. What can I do in the kitchen?  Clean up any spills right away.  Avoid walking on wet floors.  Keep items that you use a lot in easy-to-reach places.  If you need to reach something above you, use a strong step stool that has a grab bar.  Keep electrical cords out of the way.  Do not use floor polish or wax that makes floors slippery. If you must use wax, use non-skid floor wax.  Do not  have throw rugs and other things on the floor that can make you trip. What can I do with my stairs?  Do not leave any items on the stairs.  Make sure that there are handrails on both sides of the stairs and use them. Fix handrails that are broken or loose. Make sure that handrails are as long as the stairways.  Check any carpeting to make sure that it is firmly attached to the stairs. Fix any carpet that is loose or worn.  Avoid having throw rugs at the top or bottom of the stairs. If you do have throw rugs, attach them to the floor with carpet tape.  Make sure that you have a light switch at the top of the stairs and the bottom of the stairs. If you do not have them, ask someone to add them for you. What else can I do to help prevent falls?  Wear shoes that: ? Do not have high heels. ? Have rubber bottoms. ? Are comfortable and fit you well. ? Are closed at the toe. Do not wear sandals.  If you use a stepladder: ? Make sure that it is fully opened. Do not climb a closed stepladder. ? Make sure that both sides of the stepladder are locked into place. ? Ask someone to hold it for you, if possible.  Clearly mark and make sure that you can see: ? Any grab bars or handrails. ? First and last steps. ? Where the edge of each step is.  Use tools that help you move around (mobility aids) if they are needed. These include: ? Canes. ? Walkers. ? Scooters. ? Crutches.  Turn on the lights when you go into a dark area. Replace any light bulbs as soon as they burn out.  Set up your furniture so you have a clear path. Avoid moving your furniture around.  If any of your floors are uneven, fix them.  If there are any pets around you, be aware of where they are.  Review your medicines with your doctor. Some medicines can make you feel dizzy. This can increase your chance of falling. Ask your doctor what other things that you can do to help prevent falls. This information is not intended  to replace advice given to you by your health care provider. Make sure you discuss any questions you have with your health care provider. Document Released: 01/02/2009 Document Revised: 08/14/2015 Document Reviewed: 04/12/2014 Elsevier Interactive Patient Education  [employer].  Health Maintenance, Male A healthy lifestyle and preventive care is important for your health and wellness. Ask your health care provider about what schedule of regular examinations is right for you. What should I know about weight and diet? Eat a Healthy Diet  Eat plenty of vegetables, fruits, whole grains, low-fat dairy products, and lean protein.  Do not eat a lot of foods high in solid fats, added sugars, or salt.  Maintain a Healthy Weight Regular exercise can help you achieve or maintain a healthy weight. You should:  Do at least 150 minutes of exercise each week. The exercise should increase your heart rate and make you sweat (moderate-intensity exercise).  Do strength-training exercises at least twice a week.  Watch Your Levels of Cholesterol and Blood Lipids  Have your blood tested for lipids and cholesterol every 5 years starting at 78 years of age. If you are at high risk for heart disease, you should start having your blood tested when you are 78 years old. You may need to have your cholesterol levels checked more often if: ? Your lipid or cholesterol levels are high. ? You are older than 78 years of age. ? You are at high risk for heart disease.  What should I know about cancer screening? Many types of cancers can be detected early and may often be prevented. Lung Cancer  You should be screened every year for lung cancer if: ? You are a current smoker who has smoked for at least 30 years. ? You are a former smoker who has quit within the past 15 years.  Talk to your health care provider about your screening options, when you should start screening, and how often you should be  screened.  Colorectal Cancer  Routine colorectal cancer screening usually begins at 78 years of age and should be repeated every 5-10 years until you are 78 years old. You may need to be screened more often if early forms of precancerous polyps or small growths are found. Your health care provider may recommend screening at an earlier age if you have risk factors for colon cancer.  Your health care provider may recommend using home test kits to check for hidden blood in the stool.  A small camera at the end of a tube can be used to examine your colon (sigmoidoscopy or colonoscopy). This checks for the earliest forms of colorectal cancer.  Prostate and Testicular Cancer  Depending on your age and overall health, your health care provider may do certain tests to screen for prostate and testicular cancer.  Talk to your health care provider about any symptoms or concerns you have about testicular or prostate cancer.  Skin Cancer  Check your skin from head to toe regularly.  Tell your health care provider about any new moles or changes in moles, especially if: ? There is a change in a mole's size, shape, or color. ? You have a mole that is larger than a pencil eraser.  Always use sunscreen. Apply sunscreen liberally and repeat throughout the day.  Protect yourself by wearing long sleeves, pants, a wide-brimmed hat, and sunglasses when outside.  What should I know about heart disease, diabetes, and high blood pressure?  If you are 58-17 years of age, have your blood pressure checked every 3-5 years. If you are 81 years of age or older, have your blood pressure checked every year. You should have your blood pressure measured twice-once when you are at a hospital or clinic, and once when  you are not at a hospital or clinic. Record the average of the two measurements. To check your blood pressure when you are not at a hospital or clinic, you can use: ? An automated blood pressure machine at a  pharmacy. ? A home blood pressure monitor.  Talk to your health care provider about your target blood pressure.  If you are between 3-50 years old, ask your health care provider if you should take aspirin to prevent heart disease.  Have regular diabetes screenings by checking your fasting blood sugar level. ? If you are at a normal weight and have a low risk for diabetes, have this test once every three years after the age of 68. ? If you are overweight and have a high risk for diabetes, consider being tested at a younger age or more often.  A one-time screening for abdominal aortic aneurysm (AAA) by ultrasound is recommended for men aged 53-75 years who are current or former smokers. What should I know about preventing infection? Hepatitis B If you have a higher risk for hepatitis B, you should be screened for this virus. Talk with your health care provider to find out if you are at risk for hepatitis B infection. Hepatitis C Blood testing is recommended for:  Everyone born from [birth year] through 1966.  Anyone with known risk factors for hepatitis C.  Sexually Transmitted Diseases (STDs)  You should be screened each year for STDs including gonorrhea and chlamydia if: ? You are sexually active and are younger than 78 years of age. ? You are older than 78 years of age and your health care provider tells you that you are at risk for this type of infection. ? Your sexual activity has changed since you were last screened and you are at an increased risk for chlamydia or gonorrhea. Ask your health care provider if you are at risk.  Talk with your health care provider about whether you are at high risk of being infected with HIV. Your health care provider may recommend a prescription medicine to help prevent HIV infection.  What else can I do?  Schedule regular health, dental, and eye exams.  Stay current with your vaccines (immunizations).  Do not use any tobacco products, such as  cigarettes, chewing tobacco, and e-cigarettes. If you need help quitting, ask your health care provider.  Limit alcohol intake to no more than 2 drinks per day. One drink equals 12 ounces of beer, 5 ounces of wine, or 1 ounces of hard liquor.  Do not use street drugs.  Do not share needles.  Ask your health care provider for help if you need support or information about quitting drugs.  Tell your health care provider if you often feel depressed.  Tell your health care provider if you have ever been abused or do not feel safe at home. This information is not intended to replace advice given to you by your health care provider. Make sure you discuss any questions you have with your health care provider. Document Released: 09/04/2007 Document Revised: 11/05/2015 Document Reviewed: 12/10/2014 Elsevier Interactive Patient Education  [employer].

## 2017-07-26 DIAGNOSIS — M47812 Spondylosis without myelopathy or radiculopathy, cervical region: Secondary | ICD-10-CM | POA: Diagnosis not present

## 2017-08-02 ENCOUNTER — Ambulatory Visit (INDEPENDENT_AMBULATORY_CARE_PROVIDER_SITE_OTHER): Payer: Medicare Other

## 2017-08-02 ENCOUNTER — Encounter: Payer: Self-pay | Admitting: Podiatry

## 2017-08-02 ENCOUNTER — Ambulatory Visit: Payer: Medicare Other | Admitting: Podiatry

## 2017-08-02 ENCOUNTER — Other Ambulatory Visit: Payer: Self-pay | Admitting: Podiatry

## 2017-08-02 ENCOUNTER — Encounter

## 2017-08-02 ENCOUNTER — Other Ambulatory Visit: Payer: Self-pay

## 2017-08-02 DIAGNOSIS — M7752 Other enthesopathy of left foot: Secondary | ICD-10-CM | POA: Diagnosis not present

## 2017-08-02 DIAGNOSIS — M778 Other enthesopathies, not elsewhere classified: Secondary | ICD-10-CM

## 2017-08-02 DIAGNOSIS — M76829 Posterior tibial tendinitis, unspecified leg: Secondary | ICD-10-CM | POA: Diagnosis not present

## 2017-08-02 DIAGNOSIS — M7750 Other enthesopathy of unspecified foot: Secondary | ICD-10-CM | POA: Diagnosis not present

## 2017-08-02 DIAGNOSIS — M779 Enthesopathy, unspecified: Secondary | ICD-10-CM

## 2017-08-02 DIAGNOSIS — M79672 Pain in left foot: Secondary | ICD-10-CM

## 2017-08-02 NOTE — Progress Notes (Signed)
Subjective:  Patient ID: [name], male    DOB: Jul 23, 1940,  MRN: [id number] HPI Chief Complaint  Patient presents with  . Foot Pain    Medial foot and anterior ankle left - aching x years, hurts a lot when walking, Dr. Berenice Primas evaluated and said there is nothing he could do, swelling, no injury  . New Patient (Initial Visit)    77 y.o. male presents with the above complaint.   ROS: Denies fever chills nausea vomiting muscle aches pains calf pain back pain chest pain shortness of breath.  Past Medical History:  Diagnosis Date  . Arthritis    back  . Back pain    reason unknown  . Benign prostatic hyperplasia (BPH) with urinary urge incontinence    sees Dr. Jeffie Pollock   . Bipolar 1 disorder [facility])    sees Dr. Norma Fredrickson  . CKD (chronic kidney disease)    (Dr. Erling Cruz)  . Depression    takes Parnate daily  . Diverticulitis of colon 05/27/2015  . Essential hypertension 04/11/2007   Lasix in the past. Now controlled without meds   . Headache(784.0)    occasionally  . History of colon polyps   . Hypothyroidism 10/03/2009   Noted by Dr. Arnoldo Morale- patient states never on medicine- may have been checked due to parnate and abilify   . 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 Joint pain   . Joint swelling   . Osteoporosis    takes Drisdol weekly and Caltrate daily  . Peripheral edema    takes Lasix daily   . Shingles 05/27/2015  . Sleep apnea    wears c-pap  . Swelling    right hand   Past Surgical History:  Procedure Laterality Date  . COLONOSCOPY W/ POLYPECTOMY  03-10-15   per Dr. Havery Moros, adenomatous polyps, repeat in 3 yrs   . REVERSE SHOULDER ARTHROPLASTY Right 05/22/2013   Procedure: REVERSE SHOULDER ARTHROPLASTY;  Surgeon: Nita Sells, MD;  Location: [facility];  Service: Orthopedics;  Laterality: Right;  Right reverse total shoulder  . TONSILLECTOMY    . TOTAL KNEE ARTHROPLASTY Right 12/08/2015   Procedure: TOTAL KNEE ARTHROPLASTY;  Surgeon: Dorna Leitz, MD;  Location: [facility];  Service: Orthopedics;  Laterality: Right;    Current Outpatient Medications:  .  ARIPiprazole (ABILIFY) 5 MG tablet, Take 1 tablet (5 mg total) by mouth daily. (Patient taking differently: Take 2.5 mg by mouth daily. ), Disp: 90 tablet, Rfl: 3 .  Calcium-Vitamin D (CALTRATE 600 PLUS-VIT D PO), Take 1 tablet by mouth daily. , Disp: , Rfl:  .  Cholecalciferol (PA VITAMIN D-3 GUMMY PO), Take 2 tablets by mouth 2 (two) times daily., Disp: , Rfl:  .  CIPRODEX OTIC suspension, , Disp: , Rfl:  .  L-Methylfolate-Algae (L-METHYLFOLATE FORTE) 15-90.314 MG CAPS, Take 1 capsule by mouth daily., Disp: , Rfl: 0 .  mometasone (ELOCON) 0.1 % cream, , Disp: , Rfl:  .  tranylcypromine (PARNATE) 10 MG tablet, Take 30 mg by mouth 2 (two) times daily. , Disp: , Rfl:  .  ULORIC 40 MG tablet, TAKE 1 TABLET(40 MG) BY MOUTH DAILY, Disp: 30 tablet, Rfl: 2  No Known Allergies Review of Systems Objective:  There were no vitals filed for this visit.  General: Well developed, nourished, in no acute distress, alert and oriented x3   Dermatological: Skin is warm, dry and supple bilateral. Nails x 10 are well  maintained; remaining integument appears unremarkable at this time. There are no open sores, no preulcerative lesions, no rash or signs of infection present.  Vascular: Dorsalis Pedis artery and Posterior Tibial artery pedal pulses are 2/4 bilateral with immedate capillary fill time. Pedal hair growth present. No varicosities and no lower extremity edema present bilateral.   Neruologic: Grossly intact via light touch bilateral. Vibratory intact via tuning fork bilateral. Protective threshold with Semmes Wienstein monofilament intact to all pedal sites bilateral. Patellar and Achilles deep tendon reflexes 2+ bilateral. No Babinski or clonus noted bilateral.   Musculoskeletal: No gross boney pedal deformities bilateral. No pain, crepitus,  or limitation noted with foot and ankle range of motion bilateral. Muscular strength 5/5 in all groups tested bilateral.  He has pain on range of motion of the subtalar joint has pain on palpation of the sinus tarsi of the left foot.  He also has pain on palpation of the posterior tibial tendon with inversion against resistance.  Gait: Unassisted, Nonantalgic.    Radiographs:  3 views taken today demonstrates a collapse of the midfoot with osteoarthritic changes.  No acute findings noted.  Assessment & Plan:   Assessment: Posterior tibial tendon dysfunction with collapse of the midfoot.  Plan: Discussed etiology pathology conservative surgical therapies after he had offered him injections to the subtalar joint today which he declined.  So at this point I recommended orthotics.  He was casted today for orthotics.     Kimberla Driskill T. [place], [place]

## 2017-08-16 ENCOUNTER — Telehealth: Payer: Self-pay

## 2017-08-16 NOTE — Telephone Encounter (Signed)
Discussed possible Prolia at [facility]. Will fup to see if insurance would cover. Sh RN

## 2017-08-17 DIAGNOSIS — M47812 Spondylosis without myelopathy or radiculopathy, cervical region: Secondary | ICD-10-CM | POA: Diagnosis not present

## 2017-08-17 DIAGNOSIS — M47816 Spondylosis without myelopathy or radiculopathy, lumbar region: Secondary | ICD-10-CM | POA: Diagnosis not present

## 2017-08-17 DIAGNOSIS — G8929 Other chronic pain: Secondary | ICD-10-CM | POA: Diagnosis not present

## 2017-08-18 ENCOUNTER — Telehealth: Payer: Self-pay | Admitting: Podiatry

## 2017-08-18 NOTE — Telephone Encounter (Signed)
Called pt to schedule an appt to pick up orthotics that have come in and pt stated he has left 2 messages on Rick's phone stating he did not want them 2 wks ago and to cxl the order and he does not want to be billed for them. He said Liliane Channel should have called him back.  I apologized for Liliane Channel not returning his call and told him I would let him know.

## 2017-08-18 NOTE — Telephone Encounter (Signed)
fup regarding insurance coverage for Prolia? Michael Reef, do you have an update or is there anything further I can do?  [name]

## 2017-08-18 NOTE — Telephone Encounter (Signed)
Insurance approval I spending. I met with the rep yesterday and he called [employer]. They had not filed it due to needing a tax ID number. Apparently, [employer] requires it. NO other insurance requires this. We provided the information yesterday so hopefully we will get approval ASAP

## 2017-08-19 ENCOUNTER — Ambulatory Visit (INDEPENDENT_AMBULATORY_CARE_PROVIDER_SITE_OTHER): Payer: Medicare Other

## 2017-08-19 DIAGNOSIS — M810 Age-related osteoporosis without current pathological fracture: Secondary | ICD-10-CM

## 2017-08-19 MED ORDER — DENOSUMAB 60 MG/ML ~~LOC~~ SOSY
60.0000 mg | PREFILLED_SYRINGE | Freq: Once | SUBCUTANEOUS | Status: AC
  Administered 2017-08-19: 60 mg via SUBCUTANEOUS

## 2017-08-19 NOTE — Progress Notes (Signed)
Patient in today for Prolia injection. Administered in right arm. Patient tolerated well.

## 2017-08-22 NOTE — Progress Notes (Signed)
I have reviewed and agree with note, evaluation, plan.   Jamie-I sent this back to you on the 31st as I did not receive a cosign for the immunization in the way I was instructed they should come across now.  You sent it back to me today but looks the same- I will send to Cassie as well to double check documentation of immunization  Garret Reddish, MD

## 2017-08-23 NOTE — Progress Notes (Signed)
Dr Yong Channel, I am sending this back to you with the Cosign. Let me know if this one goes through correctly. Thanks.

## 2017-08-26 ENCOUNTER — Telehealth: Payer: Self-pay | Admitting: Family Medicine

## 2017-08-26 NOTE — Telephone Encounter (Signed)
See note

## 2017-08-26 NOTE — Telephone Encounter (Signed)
If the question is about methotrexate-I do not see any obvious interactions with his current medications

## 2017-08-26 NOTE — Telephone Encounter (Signed)
Copied from [place] [id number]. Topic: Quick Communication - See Telephone Encounter >> Aug 26, 2017  3:57 PM Rutherford Nail, NT wrote: CRM for notification. See Telephone encounter for: 08/26/17. Patient calling and states he has questions regarding medications to take for his back pain. The medication for his back that he wants to take- methotrexape. Wants to know if that is safe to take with his current medications? Please advise. CB#: [phone]

## 2017-08-29 NOTE — Telephone Encounter (Signed)
Scheduled patient for an appointment to discuss back pain. Patient scheduled for 08/30/17

## 2017-08-30 ENCOUNTER — Encounter: Payer: Self-pay | Admitting: Family Medicine

## 2017-08-30 ENCOUNTER — Ambulatory Visit: Payer: Medicare Other | Admitting: Family Medicine

## 2017-08-30 VITALS — BP 122/78 | HR 96 | Temp 98.1°F | Ht 64.0 in | Wt 196.0 lb

## 2017-08-30 DIAGNOSIS — M545 Low back pain: Secondary | ICD-10-CM | POA: Diagnosis not present

## 2017-08-30 DIAGNOSIS — Z79899 Other long term (current) drug therapy: Secondary | ICD-10-CM | POA: Diagnosis not present

## 2017-08-30 DIAGNOSIS — M810 Age-related osteoporosis without current pathological fracture: Secondary | ICD-10-CM | POA: Diagnosis not present

## 2017-08-30 DIAGNOSIS — G8929 Other chronic pain: Secondary | ICD-10-CM

## 2017-08-30 NOTE — Assessment & Plan Note (Signed)
Prolia started 08/19/2017  calcium/vit D also on board Update bmet today and next visit to make sure to monitor calcium.

## 2017-08-30 NOTE — Patient Instructions (Addendum)
Could trial Turmeric 520m daily or twice a day  I do think trying massage therapy at least for the surrounding muscles which probably get tight due to arthritis would be reasonable  I will look out for notes from neurology  Consider yoga, tai chi, or pilates for some exercising and stretching while strengthening core. Would stop any movement that worsens back pain.   Please stop by lab before you go They can release kidney test that was ordered recently

## 2017-08-30 NOTE — Progress Notes (Signed)
Subjective:  [name] is a 77 y.o. year old very pleasant male patient who presents for/with See problem oriented charting ROS- continued back pain, occasional urinary incontinence. No  Fecal incontinence.  Denies leg weakness.  Past Medical History-  Patient Active Problem List   Diagnosis Date Noted  . Mild depressed bipolar I disorder ([place]) 09/21/2006    Priority: High  . Chronic low back pain 03/08/2017    Priority: Medium  . Gout 07/07/2016    Priority: Medium  . Fatty liver 10/09/2014    Priority: Medium  . Obstructive sleep apnea 10/30/2008    Priority: Medium  . Osteoporosis 09/25/2008    Priority: Medium  . Chronic kidney disease (CKD), stage III (moderate) (HCC) 03/01/2008    Priority: Medium  . Senile purpura ([place]) 06/22/2017    Priority: Low  . Hypersomnolence 08/20/2016    Priority: Low  . Constipation 01/13/2016    Priority: Low  . Primary osteoarthritis of right knee 12/08/2015    Priority: Low  . Gallstones 10/09/2014    Priority: Low  . Bilateral lower extremity edema 09/25/2014    Priority: Low  . Chronic venous insufficiency 08/29/2013    Priority: Low  . Right shoulder pain 08/29/2013    Priority: Low  . Osteoarthritis of spine 03/21/2017    Medications- reviewed and updated Current Outpatient Medications  Medication Sig Dispense Refill  . ARIPiprazole (ABILIFY) 5 MG tablet Take 1 tablet (5 mg total) by mouth daily. (Patient taking differently: Take 2.5 mg by mouth daily. ) 90 tablet 3  . Calcium-Vitamin D (CALTRATE 600 PLUS-VIT D PO) Take 1 tablet by mouth daily.     . Cholecalciferol (PA VITAMIN D-3 GUMMY PO) Take 2 tablets by mouth 2 (two) times daily.    Marland Kitchen L-Methylfolate-Algae (L-METHYLFOLATE FORTE) 15-90.314 MG CAPS Take 1 capsule by mouth daily.  0  . mometasone (ELOCON) 0.1 % cream     . tranylcypromine (PARNATE) 10 MG tablet Take 30 mg by mouth 2 (two) times daily.     Marland Kitchen ULORIC 40 MG tablet TAKE 1 TABLET(40 MG) BY MOUTH DAILY 30  tablet 2   No current facility-administered medications for this visit.     Objective: BP 122/78 (BP Location: Left Arm, Patient Position: Sitting, Cuff Size: Normal)   Pulse 96   Temp 98.1 F (36.7 C) (Oral)   Ht 5' 4" (1.626 m)   Wt 196 lb (88.9 kg)   SpO2 95%   BMI 33.64 kg/m  Gen: NAD, resting comfortably CV: RRR no murmurs rubs or gallops Lungs: CTAB no crackles, wheeze, rhonchi Abdomen: soft/nontender/nondistended/normal bowel sounds. obese Ext: 1+ edema- not wearing compression stockings Skin: warm, dry Neuro: good distal strength in legs Msk: able to bend to touch toes and arch back- reasonable range of motion without significant stiffness for age  Assessment/Plan:  Chronic low back pain S: Patient has been followed by [facility] in the past.  He states he is seeing a back specialist.  He never found anything that was helpful-tried epidural injections, PT,  dry needling, prednisone. Has never tried massage therapy.   Reports having MRI in the past.  He reported previously he just tolerates the pain. Really hurts with walking. Has never been told has rheumatological issue. Saw Dr. Luan Pulling of neurology and had shots in neck which didn't help either. Rare urinary incontinence if drinks too much- not new recently. No recent worsening of pain.   Had a family member- sister in law with low back  pain and she took methotrexate which seemed to help- she has now passed.   Sees [facility] specialist tomrrrow at 2:30 PM with Dr. Lieutenant Diego per his note pad- im not sure if that is a new provider there or if patient mispelled name.  A/P: patient came in to specifically ask about starting methotrexate-given no known autoimmune disease I advised against this unless advised by rheumatology who was seen previously. They stated even with positive anti CCP- his picture is not consistent with rheumatoid arthritis.  From avs "Could trial Turmeric 552m daily or twice a  day  I do think trying massage therapy at least for the surrounding muscles which probably get tight due to arthritis would be reasonable  I will look out for notes from neurology  Consider yoga, tai chi, or pilates for some exercising and stretching while strengthening core. Would stop any movement that worsens back pain. "  Osteoporosis Prolia started 08/19/2017  calcium/vit D also on board Update bmet today and next visit to make sure to monitor calcium.    Future Appointments  Date Time Provider DOk[place]ji 12/21/2017 10:00 AM HMarin Olp MD [facility]   Lab/Order associations: High risk medication use - Plan: Basic metabolic panel  Return precautions advised.  SGarret Reddish MD

## 2017-08-30 NOTE — Assessment & Plan Note (Addendum)
S: Patient has been followed by [facility] in the past.  He states he is seeing a back specialist.  He never found anything that was helpful-tried epidural injections, PT,  dry needling, prednisone. Has never tried massage therapy.   Reports having MRI in the past.  He reported previously he just tolerates the pain. Really hurts with walking. Has never been told has rheumatological issue. Saw Dr. Luan Pulling of neurology and had shots in neck which didn't help either. Rare urinary incontinence if drinks too much- not new recently. No recent worsening of pain.   Had a family member- sister in law with low back pain and she took methotrexate which seemed to help- she has now passed.   Sees [facility] specialist tomrrrow at 2:30 PM with Dr. Lieutenant Diego per his note pad- im not sure if that is a new provider there or if patient mispelled name.  A/P: patient came in to specifically ask about starting methotrexate-given no known autoimmune disease I advised against this unless advised by rheumatology who was seen previously. They stated even with positive anti CCP- his picture is not consistent with rheumatoid arthritis.  From avs "Could trial Turmeric 540m daily or twice a day  I do think trying massage therapy at least for the surrounding muscles which probably get tight due to arthritis would be reasonable  I will look out for notes from neurology  Consider yoga, tai chi, or pilates for some exercising and stretching while strengthening core. Would stop any movement that worsens back pain. "

## 2017-08-31 DIAGNOSIS — M4316 Spondylolisthesis, lumbar region: Secondary | ICD-10-CM | POA: Diagnosis not present

## 2017-08-31 DIAGNOSIS — M48061 Spinal stenosis, lumbar region without neurogenic claudication: Secondary | ICD-10-CM | POA: Diagnosis not present

## 2017-08-31 DIAGNOSIS — G8929 Other chronic pain: Secondary | ICD-10-CM | POA: Diagnosis not present

## 2017-08-31 DIAGNOSIS — M545 Low back pain: Secondary | ICD-10-CM | POA: Diagnosis not present

## 2017-08-31 LAB — BASIC METABOLIC PANEL
BUN: 52 mg/dL — ABNORMAL HIGH (ref 6–23)
CALCIUM: 9.4 mg/dL (ref 8.4–10.5)
CO2: 27 mEq/L (ref 19–32)
Chloride: 103 mEq/L (ref 96–112)
Creatinine, Ser: 2.33 mg/dL — ABNORMAL HIGH (ref 0.40–1.50)
GFR: 28.97 mL/min — AB (ref >60.00)
Glucose, Bld: 89 mg/dL (ref 70–99)
POTASSIUM: 4.6 meq/L (ref 3.5–5.1)
SODIUM: 138 meq/L (ref 135–145)

## 2017-09-01 ENCOUNTER — Telehealth: Payer: Self-pay | Admitting: Family Medicine

## 2017-09-01 DIAGNOSIS — J9811 Atelectasis: Secondary | ICD-10-CM | POA: Diagnosis not present

## 2017-09-01 DIAGNOSIS — D72829 Elevated white blood cell count, unspecified: Secondary | ICD-10-CM | POA: Diagnosis not present

## 2017-09-01 DIAGNOSIS — E872 Acidosis: Secondary | ICD-10-CM | POA: Diagnosis not present

## 2017-09-01 DIAGNOSIS — K567 Ileus, unspecified: Secondary | ICD-10-CM | POA: Diagnosis not present

## 2017-09-01 DIAGNOSIS — N189 Chronic kidney disease, unspecified: Secondary | ICD-10-CM | POA: Insufficient documentation

## 2017-09-01 DIAGNOSIS — S72042A Displaced fracture of base of neck of left femur, initial encounter for closed fracture: Secondary | ICD-10-CM | POA: Diagnosis not present

## 2017-09-01 DIAGNOSIS — M6281 Muscle weakness (generalized): Secondary | ICD-10-CM | POA: Diagnosis not present

## 2017-09-01 DIAGNOSIS — Z9889 Other specified postprocedural states: Secondary | ICD-10-CM | POA: Diagnosis not present

## 2017-09-01 DIAGNOSIS — T1490XA Injury, unspecified, initial encounter: Secondary | ICD-10-CM | POA: Diagnosis not present

## 2017-09-01 DIAGNOSIS — R931 Abnormal findings on diagnostic imaging of heart and coronary circulation: Secondary | ICD-10-CM | POA: Diagnosis not present

## 2017-09-01 DIAGNOSIS — R5381 Other malaise: Secondary | ICD-10-CM | POA: Diagnosis not present

## 2017-09-01 DIAGNOSIS — D649 Anemia, unspecified: Secondary | ICD-10-CM | POA: Diagnosis not present

## 2017-09-01 DIAGNOSIS — S50311A Abrasion of right elbow, initial encounter: Secondary | ICD-10-CM | POA: Diagnosis not present

## 2017-09-01 DIAGNOSIS — N179 Acute kidney failure, unspecified: Secondary | ICD-10-CM | POA: Diagnosis not present

## 2017-09-01 DIAGNOSIS — K668 Other specified disorders of peritoneum: Secondary | ICD-10-CM | POA: Diagnosis not present

## 2017-09-01 DIAGNOSIS — Y929 Unspecified place or not applicable: Secondary | ICD-10-CM | POA: Diagnosis not present

## 2017-09-01 DIAGNOSIS — R109 Unspecified abdominal pain: Secondary | ICD-10-CM | POA: Diagnosis not present

## 2017-09-01 DIAGNOSIS — N183 Chronic kidney disease, stage 3 (moderate): Secondary | ICD-10-CM | POA: Diagnosis not present

## 2017-09-01 DIAGNOSIS — R140 Abdominal distension (gaseous): Secondary | ICD-10-CM | POA: Diagnosis not present

## 2017-09-01 DIAGNOSIS — K5900 Constipation, unspecified: Secondary | ICD-10-CM | POA: Diagnosis not present

## 2017-09-01 DIAGNOSIS — S72009D Fracture of unspecified part of neck of unspecified femur, subsequent encounter for closed fracture with routine healing: Secondary | ICD-10-CM | POA: Diagnosis not present

## 2017-09-01 DIAGNOSIS — J9600 Acute respiratory failure, unspecified whether with hypoxia or hypercapnia: Secondary | ICD-10-CM | POA: Diagnosis not present

## 2017-09-01 DIAGNOSIS — W109XXA Fall (on) (from) unspecified stairs and steps, initial encounter: Secondary | ICD-10-CM | POA: Diagnosis not present

## 2017-09-01 DIAGNOSIS — R2243 Localized swelling, mass and lump, lower limb, bilateral: Secondary | ICD-10-CM | POA: Diagnosis not present

## 2017-09-01 DIAGNOSIS — W108XXA Fall (on) (from) other stairs and steps, initial encounter: Secondary | ICD-10-CM | POA: Diagnosis not present

## 2017-09-01 DIAGNOSIS — S72142A Displaced intertrochanteric fracture of left femur, initial encounter for closed fracture: Secondary | ICD-10-CM | POA: Diagnosis not present

## 2017-09-01 DIAGNOSIS — I509 Heart failure, unspecified: Secondary | ICD-10-CM | POA: Diagnosis not present

## 2017-09-01 DIAGNOSIS — J9601 Acute respiratory failure with hypoxia: Secondary | ICD-10-CM | POA: Diagnosis not present

## 2017-09-01 DIAGNOSIS — I5033 Acute on chronic diastolic (congestive) heart failure: Secondary | ICD-10-CM | POA: Diagnosis not present

## 2017-09-01 DIAGNOSIS — R262 Difficulty in walking, not elsewhere classified: Secondary | ICD-10-CM | POA: Diagnosis not present

## 2017-09-01 DIAGNOSIS — S51011A Laceration without foreign body of right elbow, initial encounter: Secondary | ICD-10-CM | POA: Diagnosis not present

## 2017-09-01 DIAGNOSIS — R1310 Dysphagia, unspecified: Secondary | ICD-10-CM | POA: Diagnosis not present

## 2017-09-01 DIAGNOSIS — W010XXA Fall on same level from slipping, tripping and stumbling without subsequent striking against object, initial encounter: Secondary | ICD-10-CM | POA: Diagnosis not present

## 2017-09-01 DIAGNOSIS — I5031 Acute diastolic (congestive) heart failure: Secondary | ICD-10-CM | POA: Diagnosis not present

## 2017-09-01 DIAGNOSIS — Z743 Need for continuous supervision: Secondary | ICD-10-CM | POA: Diagnosis not present

## 2017-09-01 DIAGNOSIS — R918 Other nonspecific abnormal finding of lung field: Secondary | ICD-10-CM | POA: Diagnosis not present

## 2017-09-01 DIAGNOSIS — Z9181 History of falling: Secondary | ICD-10-CM | POA: Diagnosis not present

## 2017-09-01 DIAGNOSIS — Z452 Encounter for adjustment and management of vascular access device: Secondary | ICD-10-CM | POA: Diagnosis not present

## 2017-09-01 DIAGNOSIS — Z8781 Personal history of (healed) traumatic fracture: Secondary | ICD-10-CM | POA: Insufficient documentation

## 2017-09-01 DIAGNOSIS — W19XXXA Unspecified fall, initial encounter: Secondary | ICD-10-CM | POA: Diagnosis not present

## 2017-09-01 DIAGNOSIS — R1312 Dysphagia, oropharyngeal phase: Secondary | ICD-10-CM | POA: Diagnosis not present

## 2017-09-01 DIAGNOSIS — S72009A Fracture of unspecified part of neck of unspecified femur, initial encounter for closed fracture: Secondary | ICD-10-CM | POA: Insufficient documentation

## 2017-09-01 DIAGNOSIS — R5383 Other fatigue: Secondary | ICD-10-CM | POA: Diagnosis not present

## 2017-09-01 DIAGNOSIS — S299XXA Unspecified injury of thorax, initial encounter: Secondary | ICD-10-CM | POA: Diagnosis not present

## 2017-09-01 DIAGNOSIS — M109 Gout, unspecified: Secondary | ICD-10-CM | POA: Diagnosis not present

## 2017-09-01 DIAGNOSIS — M160 Bilateral primary osteoarthritis of hip: Secondary | ICD-10-CM | POA: Diagnosis not present

## 2017-09-01 DIAGNOSIS — E8770 Fluid overload, unspecified: Secondary | ICD-10-CM | POA: Diagnosis not present

## 2017-09-01 HISTORY — DX: Fracture of unspecified part of neck of unspecified femur, initial encounter for closed fracture: S72.009A

## 2017-09-01 NOTE — Telephone Encounter (Signed)
Copied from [place]. Topic: Quick Communication - See Telephone Encounter >> Sep 01, 2017 10:20 AM Ivar Drape wrote: CRM for notification. See Telephone encounter for: 09/01/17. Patient would like a call back.  He wants to know if his kidneys are ok.

## 2017-09-01 NOTE — Telephone Encounter (Signed)
See note

## 2017-09-02 HISTORY — PX: HIP FRACTURE SURGERY: SHX118

## 2017-09-02 NOTE — Telephone Encounter (Signed)
Called and left a voicemail message letting patient know that lab results just showed he was a little dehydrated and to push fluids

## 2017-09-04 DIAGNOSIS — E662 Morbid (severe) obesity with alveolar hypoventilation: Secondary | ICD-10-CM | POA: Insufficient documentation

## 2017-09-04 DIAGNOSIS — R509 Fever, unspecified: Secondary | ICD-10-CM | POA: Insufficient documentation

## 2017-09-04 DIAGNOSIS — R7989 Other specified abnormal findings of blood chemistry: Secondary | ICD-10-CM | POA: Insufficient documentation

## 2017-09-04 DIAGNOSIS — R601 Generalized edema: Secondary | ICD-10-CM | POA: Insufficient documentation

## 2017-09-04 DIAGNOSIS — J9811 Atelectasis: Secondary | ICD-10-CM | POA: Insufficient documentation

## 2017-09-04 DIAGNOSIS — K9189 Other postprocedural complications and disorders of digestive system: Secondary | ICD-10-CM

## 2017-09-04 DIAGNOSIS — R945 Abnormal results of liver function studies: Secondary | ICD-10-CM | POA: Insufficient documentation

## 2017-09-04 DIAGNOSIS — K567 Ileus, unspecified: Secondary | ICD-10-CM | POA: Insufficient documentation

## 2017-09-04 DIAGNOSIS — J9601 Acute respiratory failure with hypoxia: Secondary | ICD-10-CM | POA: Insufficient documentation

## 2017-09-04 DIAGNOSIS — D72829 Elevated white blood cell count, unspecified: Secondary | ICD-10-CM | POA: Insufficient documentation

## 2017-09-07 ENCOUNTER — Other Ambulatory Visit: Payer: Medicare Other | Admitting: Orthotics

## 2017-09-12 DIAGNOSIS — M6281 Muscle weakness (generalized): Secondary | ICD-10-CM | POA: Diagnosis not present

## 2017-09-12 DIAGNOSIS — N179 Acute kidney failure, unspecified: Secondary | ICD-10-CM | POA: Diagnosis not present

## 2017-09-12 DIAGNOSIS — L89601 Pressure ulcer of unspecified heel, stage 1: Secondary | ICD-10-CM | POA: Diagnosis not present

## 2017-09-12 DIAGNOSIS — J9600 Acute respiratory failure, unspecified whether with hypoxia or hypercapnia: Secondary | ICD-10-CM | POA: Diagnosis not present

## 2017-09-12 DIAGNOSIS — R1312 Dysphagia, oropharyngeal phase: Secondary | ICD-10-CM | POA: Diagnosis not present

## 2017-09-12 DIAGNOSIS — E8770 Fluid overload, unspecified: Secondary | ICD-10-CM | POA: Diagnosis not present

## 2017-09-12 DIAGNOSIS — R1310 Dysphagia, unspecified: Secondary | ICD-10-CM | POA: Diagnosis not present

## 2017-09-12 DIAGNOSIS — R262 Difficulty in walking, not elsewhere classified: Secondary | ICD-10-CM | POA: Diagnosis not present

## 2017-09-12 DIAGNOSIS — S72009A Fracture of unspecified part of neck of unspecified femur, initial encounter for closed fracture: Secondary | ICD-10-CM | POA: Diagnosis not present

## 2017-09-12 DIAGNOSIS — I509 Heart failure, unspecified: Secondary | ICD-10-CM | POA: Diagnosis not present

## 2017-09-12 DIAGNOSIS — J9601 Acute respiratory failure with hypoxia: Secondary | ICD-10-CM | POA: Diagnosis not present

## 2017-09-12 DIAGNOSIS — R05 Cough: Secondary | ICD-10-CM | POA: Diagnosis not present

## 2017-09-12 DIAGNOSIS — Z9181 History of falling: Secondary | ICD-10-CM | POA: Diagnosis not present

## 2017-09-12 DIAGNOSIS — M109 Gout, unspecified: Secondary | ICD-10-CM | POA: Diagnosis not present

## 2017-09-12 DIAGNOSIS — S72009D Fracture of unspecified part of neck of unspecified femur, subsequent encounter for closed fracture with routine healing: Secondary | ICD-10-CM | POA: Diagnosis not present

## 2017-09-12 DIAGNOSIS — R0601 Orthopnea: Secondary | ICD-10-CM | POA: Diagnosis not present

## 2017-09-12 DIAGNOSIS — R609 Edema, unspecified: Secondary | ICD-10-CM | POA: Diagnosis not present

## 2017-09-12 DIAGNOSIS — G629 Polyneuropathy, unspecified: Secondary | ICD-10-CM | POA: Diagnosis not present

## 2017-09-12 DIAGNOSIS — R52 Pain, unspecified: Secondary | ICD-10-CM | POA: Diagnosis not present

## 2017-09-12 DIAGNOSIS — R319 Hematuria, unspecified: Secondary | ICD-10-CM | POA: Diagnosis not present

## 2017-09-13 ENCOUNTER — Other Ambulatory Visit: Payer: Self-pay | Admitting: Family Medicine

## 2017-09-13 DIAGNOSIS — R319 Hematuria, unspecified: Secondary | ICD-10-CM | POA: Diagnosis not present

## 2017-09-13 DIAGNOSIS — S72009A Fracture of unspecified part of neck of unspecified femur, initial encounter for closed fracture: Secondary | ICD-10-CM | POA: Diagnosis not present

## 2017-09-13 DIAGNOSIS — J9600 Acute respiratory failure, unspecified whether with hypoxia or hypercapnia: Secondary | ICD-10-CM | POA: Diagnosis not present

## 2017-09-13 DIAGNOSIS — I509 Heart failure, unspecified: Secondary | ICD-10-CM | POA: Diagnosis not present

## 2017-09-13 DIAGNOSIS — N179 Acute kidney failure, unspecified: Secondary | ICD-10-CM | POA: Diagnosis not present

## 2017-09-13 DIAGNOSIS — J9601 Acute respiratory failure with hypoxia: Secondary | ICD-10-CM | POA: Diagnosis not present

## 2017-09-13 DIAGNOSIS — R262 Difficulty in walking, not elsewhere classified: Secondary | ICD-10-CM | POA: Diagnosis not present

## 2017-09-14 DIAGNOSIS — S72009A Fracture of unspecified part of neck of unspecified femur, initial encounter for closed fracture: Secondary | ICD-10-CM | POA: Diagnosis not present

## 2017-09-14 DIAGNOSIS — L89601 Pressure ulcer of unspecified heel, stage 1: Secondary | ICD-10-CM | POA: Diagnosis not present

## 2017-09-14 DIAGNOSIS — R52 Pain, unspecified: Secondary | ICD-10-CM | POA: Diagnosis not present

## 2017-09-14 DIAGNOSIS — I509 Heart failure, unspecified: Secondary | ICD-10-CM | POA: Diagnosis not present

## 2017-09-20 DIAGNOSIS — R52 Pain, unspecified: Secondary | ICD-10-CM | POA: Diagnosis not present

## 2017-09-20 DIAGNOSIS — G629 Polyneuropathy, unspecified: Secondary | ICD-10-CM | POA: Diagnosis not present

## 2017-09-20 DIAGNOSIS — R609 Edema, unspecified: Secondary | ICD-10-CM | POA: Diagnosis not present

## 2017-09-20 DIAGNOSIS — R05 Cough: Secondary | ICD-10-CM | POA: Diagnosis not present

## 2017-09-20 DIAGNOSIS — S72009A Fracture of unspecified part of neck of unspecified femur, initial encounter for closed fracture: Secondary | ICD-10-CM | POA: Diagnosis not present

## 2017-09-21 ENCOUNTER — Telehealth: Payer: Self-pay | Admitting: Podiatry

## 2017-09-21 NOTE — Telephone Encounter (Signed)
Pt left several messages stating he is getting a bill for the orthotics that he cannot pay for. He said he has left several messages for Liliane Channel to cancel the order.    I explained that when I spoke to him on 5.30.19 that the orthotics had been made already and that we cannot send them back as that they are custom made for him. I explained that when he was molded for them and signed the paperwork on 5.14.19 stating for [place] to go ahead with them they were ordered that day.  Pt stated he is currently in the hospital due to falling and braking his hip and cannot afford to pay for them. After speaking to the office manager in detail about this pt we have decided to take charges out as a courtesy. I told pt as a courtesy we will take the charges out this one time but in the future to please be aware of what he is signing for and agreeing to pay. Pt was very thankful and is aware that we are doing this as a courtesy for this time only. He said thank you again.

## 2017-09-26 DIAGNOSIS — R52 Pain, unspecified: Secondary | ICD-10-CM | POA: Diagnosis not present

## 2017-09-26 DIAGNOSIS — G629 Polyneuropathy, unspecified: Secondary | ICD-10-CM | POA: Diagnosis not present

## 2017-09-26 DIAGNOSIS — S72009A Fracture of unspecified part of neck of unspecified femur, initial encounter for closed fracture: Secondary | ICD-10-CM | POA: Diagnosis not present

## 2017-09-26 DIAGNOSIS — R05 Cough: Secondary | ICD-10-CM | POA: Diagnosis not present

## 2017-09-26 DIAGNOSIS — R609 Edema, unspecified: Secondary | ICD-10-CM | POA: Diagnosis not present

## 2017-09-29 DIAGNOSIS — R609 Edema, unspecified: Secondary | ICD-10-CM | POA: Diagnosis not present

## 2017-09-29 DIAGNOSIS — S72009A Fracture of unspecified part of neck of unspecified femur, initial encounter for closed fracture: Secondary | ICD-10-CM | POA: Diagnosis not present

## 2017-09-29 DIAGNOSIS — R0601 Orthopnea: Secondary | ICD-10-CM | POA: Diagnosis not present

## 2017-09-29 DIAGNOSIS — I509 Heart failure, unspecified: Secondary | ICD-10-CM | POA: Diagnosis not present

## 2017-09-30 DIAGNOSIS — N179 Acute kidney failure, unspecified: Secondary | ICD-10-CM | POA: Diagnosis not present

## 2017-09-30 DIAGNOSIS — J9600 Acute respiratory failure, unspecified whether with hypoxia or hypercapnia: Secondary | ICD-10-CM | POA: Diagnosis not present

## 2017-09-30 DIAGNOSIS — I509 Heart failure, unspecified: Secondary | ICD-10-CM | POA: Diagnosis not present

## 2017-09-30 DIAGNOSIS — S72009A Fracture of unspecified part of neck of unspecified femur, initial encounter for closed fracture: Secondary | ICD-10-CM | POA: Diagnosis not present

## 2017-10-01 DIAGNOSIS — R319 Hematuria, unspecified: Secondary | ICD-10-CM | POA: Diagnosis not present

## 2017-10-01 DIAGNOSIS — J9601 Acute respiratory failure with hypoxia: Secondary | ICD-10-CM | POA: Diagnosis not present

## 2017-10-01 DIAGNOSIS — S72009A Fracture of unspecified part of neck of unspecified femur, initial encounter for closed fracture: Secondary | ICD-10-CM | POA: Diagnosis not present

## 2017-10-01 DIAGNOSIS — R262 Difficulty in walking, not elsewhere classified: Secondary | ICD-10-CM | POA: Diagnosis not present

## 2017-10-04 DIAGNOSIS — S72002S Fracture of unspecified part of neck of left femur, sequela: Secondary | ICD-10-CM | POA: Diagnosis not present

## 2017-10-05 DIAGNOSIS — I5031 Acute diastolic (congestive) heart failure: Secondary | ICD-10-CM | POA: Insufficient documentation

## 2017-10-05 DIAGNOSIS — R609 Edema, unspecified: Secondary | ICD-10-CM | POA: Diagnosis not present

## 2017-10-05 DIAGNOSIS — N183 Chronic kidney disease, stage 3 (moderate): Secondary | ICD-10-CM | POA: Diagnosis not present

## 2017-10-13 ENCOUNTER — Telehealth: Payer: Self-pay | Admitting: Family Medicine

## 2017-10-13 DIAGNOSIS — R609 Edema, unspecified: Secondary | ICD-10-CM | POA: Diagnosis not present

## 2017-10-13 NOTE — Telephone Encounter (Unsigned)
Copied from [place] [id number]. Topic: Quick Communication - Rx Refill/Question >> Oct 13, 2017  4:32 PM Neva Seat wrote: [name] 40 MG tablet  Pt needing refills  [facility], [place] [employer] [address] AT [address] [place] [address] Phone: [phone] Fax: [phone]

## 2017-10-14 ENCOUNTER — Other Ambulatory Visit: Payer: Self-pay

## 2017-10-14 DIAGNOSIS — R609 Edema, unspecified: Secondary | ICD-10-CM | POA: Diagnosis not present

## 2017-10-14 DIAGNOSIS — G4733 Obstructive sleep apnea (adult) (pediatric): Secondary | ICD-10-CM | POA: Diagnosis not present

## 2017-10-14 DIAGNOSIS — K5909 Other constipation: Secondary | ICD-10-CM | POA: Diagnosis not present

## 2017-10-14 DIAGNOSIS — E790 Hyperuricemia without signs of inflammatory arthritis and tophaceous disease: Secondary | ICD-10-CM | POA: Diagnosis not present

## 2017-10-14 MED ORDER — FEBUXOSTAT 40 MG PO TABS: ORAL_TABLET | ORAL | 1 refills | Status: DC

## 2017-10-15 DIAGNOSIS — E790 Hyperuricemia without signs of inflammatory arthritis and tophaceous disease: Secondary | ICD-10-CM | POA: Insufficient documentation

## 2017-10-15 DIAGNOSIS — F319 Bipolar disorder, unspecified: Secondary | ICD-10-CM | POA: Insufficient documentation

## 2017-10-18 DIAGNOSIS — R2243 Localized swelling, mass and lump, lower limb, bilateral: Secondary | ICD-10-CM | POA: Diagnosis not present

## 2017-10-18 DIAGNOSIS — R0602 Shortness of breath: Secondary | ICD-10-CM | POA: Diagnosis not present

## 2017-10-18 DIAGNOSIS — R600 Localized edema: Secondary | ICD-10-CM | POA: Diagnosis not present

## 2017-10-19 DIAGNOSIS — G4733 Obstructive sleep apnea (adult) (pediatric): Secondary | ICD-10-CM | POA: Diagnosis not present

## 2017-10-19 DIAGNOSIS — R0602 Shortness of breath: Secondary | ICD-10-CM | POA: Diagnosis not present

## 2017-10-21 DIAGNOSIS — R601 Generalized edema: Secondary | ICD-10-CM | POA: Diagnosis not present

## 2017-10-21 DIAGNOSIS — N179 Acute kidney failure, unspecified: Secondary | ICD-10-CM | POA: Diagnosis not present

## 2017-10-21 DIAGNOSIS — M109 Gout, unspecified: Secondary | ICD-10-CM | POA: Diagnosis not present

## 2017-10-21 DIAGNOSIS — N183 Chronic kidney disease, stage 3 (moderate): Secondary | ICD-10-CM | POA: Diagnosis not present

## 2017-10-25 DIAGNOSIS — R0602 Shortness of breath: Secondary | ICD-10-CM | POA: Diagnosis not present

## 2017-10-25 DIAGNOSIS — R0600 Dyspnea, unspecified: Secondary | ICD-10-CM | POA: Diagnosis not present

## 2017-10-26 DIAGNOSIS — G4733 Obstructive sleep apnea (adult) (pediatric): Secondary | ICD-10-CM | POA: Diagnosis not present

## 2017-10-31 DIAGNOSIS — N179 Acute kidney failure, unspecified: Secondary | ICD-10-CM | POA: Diagnosis not present

## 2017-11-02 DIAGNOSIS — I509 Heart failure, unspecified: Secondary | ICD-10-CM | POA: Diagnosis not present

## 2017-11-02 DIAGNOSIS — S72009D Fracture of unspecified part of neck of unspecified femur, subsequent encounter for closed fracture with routine healing: Secondary | ICD-10-CM | POA: Diagnosis not present

## 2017-11-03 DIAGNOSIS — G4733 Obstructive sleep apnea (adult) (pediatric): Secondary | ICD-10-CM | POA: Diagnosis not present

## 2017-11-03 DIAGNOSIS — I5031 Acute diastolic (congestive) heart failure: Secondary | ICD-10-CM | POA: Diagnosis not present

## 2017-11-03 DIAGNOSIS — R609 Edema, unspecified: Secondary | ICD-10-CM | POA: Diagnosis not present

## 2017-11-03 DIAGNOSIS — N183 Chronic kidney disease, stage 3 (moderate): Secondary | ICD-10-CM | POA: Diagnosis not present

## 2017-11-04 DIAGNOSIS — I5031 Acute diastolic (congestive) heart failure: Secondary | ICD-10-CM | POA: Diagnosis not present

## 2017-11-04 DIAGNOSIS — N183 Chronic kidney disease, stage 3 (moderate): Secondary | ICD-10-CM | POA: Diagnosis not present

## 2017-11-08 DIAGNOSIS — N179 Acute kidney failure, unspecified: Secondary | ICD-10-CM | POA: Diagnosis not present

## 2017-11-11 ENCOUNTER — Other Ambulatory Visit: Payer: Self-pay | Admitting: Family Medicine

## 2017-11-11 DIAGNOSIS — N179 Acute kidney failure, unspecified: Secondary | ICD-10-CM | POA: Diagnosis not present

## 2017-11-11 DIAGNOSIS — J449 Chronic obstructive pulmonary disease, unspecified: Secondary | ICD-10-CM | POA: Diagnosis not present

## 2017-11-11 DIAGNOSIS — I872 Venous insufficiency (chronic) (peripheral): Secondary | ICD-10-CM | POA: Diagnosis not present

## 2017-11-11 DIAGNOSIS — R0602 Shortness of breath: Secondary | ICD-10-CM | POA: Diagnosis not present

## 2017-11-11 DIAGNOSIS — R944 Abnormal results of kidney function studies: Secondary | ICD-10-CM | POA: Diagnosis not present

## 2017-11-11 DIAGNOSIS — M4692 Unspecified inflammatory spondylopathy, cervical region: Secondary | ICD-10-CM | POA: Diagnosis not present

## 2017-11-11 DIAGNOSIS — G4733 Obstructive sleep apnea (adult) (pediatric): Secondary | ICD-10-CM | POA: Diagnosis not present

## 2017-11-11 DIAGNOSIS — E790 Hyperuricemia without signs of inflammatory arthritis and tophaceous disease: Secondary | ICD-10-CM | POA: Diagnosis not present

## 2017-11-11 DIAGNOSIS — I5031 Acute diastolic (congestive) heart failure: Secondary | ICD-10-CM | POA: Diagnosis not present

## 2017-11-11 DIAGNOSIS — R600 Localized edema: Secondary | ICD-10-CM | POA: Diagnosis not present

## 2017-11-11 DIAGNOSIS — N183 Chronic kidney disease, stage 3 (moderate): Secondary | ICD-10-CM | POA: Diagnosis not present

## 2017-11-11 DIAGNOSIS — G629 Polyneuropathy, unspecified: Secondary | ICD-10-CM | POA: Diagnosis not present

## 2017-11-11 DIAGNOSIS — M47812 Spondylosis without myelopathy or radiculopathy, cervical region: Secondary | ICD-10-CM | POA: Diagnosis not present

## 2017-11-11 DIAGNOSIS — R0600 Dyspnea, unspecified: Secondary | ICD-10-CM | POA: Insufficient documentation

## 2017-11-11 DIAGNOSIS — Z79899 Other long term (current) drug therapy: Secondary | ICD-10-CM | POA: Diagnosis not present

## 2017-11-11 DIAGNOSIS — G8929 Other chronic pain: Secondary | ICD-10-CM | POA: Diagnosis not present

## 2017-11-11 DIAGNOSIS — K5909 Other constipation: Secondary | ICD-10-CM | POA: Diagnosis not present

## 2017-11-18 ENCOUNTER — Telehealth: Payer: Self-pay | Admitting: Family Medicine

## 2017-11-18 NOTE — Telephone Encounter (Signed)
Copied from [place] [id number]. Topic: Inquiry >> Nov 18, 2017  8:42 AM Oliver Pila B wrote: Reason for CRM: [facility] called b/c pt will need orders for the pt to start home health care when he returns home, contact [facility] @ [phone] Fax: [phone]  Message left by Dortha Kern  Contact info: [phone]

## 2017-11-18 NOTE — Telephone Encounter (Signed)
Called and spoke to [facility] who states they weren't aware at this time of patient. I provided them with Dortha Kern contact information. They are going to reach out to her to coordinate care and will call our office back when they know what orders they need. I provided [facility]mings with our contact information and my name as a contact person.

## 2017-11-20 DIAGNOSIS — I509 Heart failure, unspecified: Secondary | ICD-10-CM | POA: Diagnosis not present

## 2017-11-20 DIAGNOSIS — G4733 Obstructive sleep apnea (adult) (pediatric): Secondary | ICD-10-CM | POA: Diagnosis not present

## 2017-11-20 DIAGNOSIS — S72142D Displaced intertrochanteric fracture of left femur, subsequent encounter for closed fracture with routine healing: Secondary | ICD-10-CM | POA: Diagnosis not present

## 2017-11-20 DIAGNOSIS — J9600 Acute respiratory failure, unspecified whether with hypoxia or hypercapnia: Secondary | ICD-10-CM | POA: Diagnosis not present

## 2017-11-20 DIAGNOSIS — N183 Chronic kidney disease, stage 3 (moderate): Secondary | ICD-10-CM | POA: Diagnosis not present

## 2017-11-22 DIAGNOSIS — G4733 Obstructive sleep apnea (adult) (pediatric): Secondary | ICD-10-CM | POA: Diagnosis not present

## 2017-11-22 DIAGNOSIS — J9600 Acute respiratory failure, unspecified whether with hypoxia or hypercapnia: Secondary | ICD-10-CM | POA: Diagnosis not present

## 2017-11-22 DIAGNOSIS — S72142D Displaced intertrochanteric fracture of left femur, subsequent encounter for closed fracture with routine healing: Secondary | ICD-10-CM | POA: Diagnosis not present

## 2017-11-22 DIAGNOSIS — I509 Heart failure, unspecified: Secondary | ICD-10-CM | POA: Diagnosis not present

## 2017-11-22 DIAGNOSIS — N183 Chronic kidney disease, stage 3 (moderate): Secondary | ICD-10-CM | POA: Diagnosis not present

## 2017-11-24 NOTE — Telephone Encounter (Signed)
Megan client coordinator [facility] is calling and [facility] has ordered nursing , OT, PT and a Education officer, museum. Jinny Blossom can be reached at [phone]

## 2017-11-24 NOTE — Telephone Encounter (Signed)
See note

## 2017-11-28 DIAGNOSIS — G4733 Obstructive sleep apnea (adult) (pediatric): Secondary | ICD-10-CM | POA: Diagnosis not present

## 2017-11-28 NOTE — Telephone Encounter (Signed)
Called and spoke with Jinny Blossom and confirmed Dr. Yong Channel as PCP and that they are to send all home health orders here for Dr. Yong Channel to review.

## 2017-11-29 ENCOUNTER — Encounter: Payer: Self-pay | Admitting: Family Medicine

## 2017-11-29 ENCOUNTER — Telehealth: Payer: Self-pay | Admitting: Family Medicine

## 2017-11-29 ENCOUNTER — Ambulatory Visit (INDEPENDENT_AMBULATORY_CARE_PROVIDER_SITE_OTHER): Payer: Medicare Other

## 2017-11-29 ENCOUNTER — Ambulatory Visit (INDEPENDENT_AMBULATORY_CARE_PROVIDER_SITE_OTHER): Payer: Medicare Other | Admitting: Family Medicine

## 2017-11-29 VITALS — BP 142/92 | HR 78 | Temp 97.7°F | Ht 64.0 in | Wt 198.8 lb

## 2017-11-29 DIAGNOSIS — R609 Edema, unspecified: Secondary | ICD-10-CM

## 2017-11-29 DIAGNOSIS — R0602 Shortness of breath: Secondary | ICD-10-CM

## 2017-11-29 DIAGNOSIS — I5033 Acute on chronic diastolic (congestive) heart failure: Secondary | ICD-10-CM

## 2017-11-29 DIAGNOSIS — I872 Venous insufficiency (chronic) (peripheral): Secondary | ICD-10-CM | POA: Diagnosis not present

## 2017-11-29 DIAGNOSIS — I129 Hypertensive chronic kidney disease with stage 1 through stage 4 chronic kidney disease, or unspecified chronic kidney disease: Secondary | ICD-10-CM | POA: Diagnosis not present

## 2017-11-29 DIAGNOSIS — K760 Fatty (change of) liver, not elsewhere classified: Secondary | ICD-10-CM

## 2017-11-29 DIAGNOSIS — S72002A Fracture of unspecified part of neck of left femur, initial encounter for closed fracture: Secondary | ICD-10-CM | POA: Diagnosis not present

## 2017-11-29 DIAGNOSIS — N183 Chronic kidney disease, stage 3 unspecified: Secondary | ICD-10-CM

## 2017-11-29 DIAGNOSIS — M810 Age-related osteoporosis without current pathological fracture: Secondary | ICD-10-CM

## 2017-11-29 DIAGNOSIS — G4733 Obstructive sleep apnea (adult) (pediatric): Secondary | ICD-10-CM

## 2017-11-29 DIAGNOSIS — S72002D Fracture of unspecified part of neck of left femur, subsequent encounter for closed fracture with routine healing: Secondary | ICD-10-CM | POA: Diagnosis not present

## 2017-11-29 DIAGNOSIS — J811 Chronic pulmonary edema: Secondary | ICD-10-CM | POA: Diagnosis not present

## 2017-11-29 DIAGNOSIS — S7222XD Displaced subtrochanteric fracture of left femur, subsequent encounter for closed fracture with routine healing: Secondary | ICD-10-CM | POA: Diagnosis not present

## 2017-11-29 LAB — CBC WITH DIFFERENTIAL/PLATELET
Basophils Absolute: 0.1 10*3/uL (ref 0.0–0.1)
Basophils Relative: 1.3 % (ref 0.0–3.0)
EOS ABS: 0.4 10*3/uL (ref 0.0–0.7)
Eosinophils Relative: 5.3 % — ABNORMAL HIGH (ref 0.0–5.0)
HCT: 34.5 % — ABNORMAL LOW (ref 39.0–52.0)
Hemoglobin: 11.6 g/dL — ABNORMAL LOW (ref 13.0–17.0)
LYMPHS ABS: 1.3 10*3/uL (ref 0.7–4.0)
LYMPHS PCT: 16.9 % (ref 12.0–46.0)
MCHC: 33.6 g/dL (ref 30.0–36.0)
MCV: 92.6 fl (ref 78.0–100.0)
MONOS PCT: 11.1 % (ref 3.0–12.0)
Monocytes Absolute: 0.9 10*3/uL (ref 0.1–1.0)
NEUTROS PCT: 65.4 % (ref 43.0–77.0)
Neutro Abs: 5 10*3/uL (ref 1.4–7.7)
Platelets: 239 10*3/uL (ref 150.0–400.0)
RBC: 3.72 Mil/uL — AB (ref 4.22–5.81)
RDW: 13.2 % (ref 11.5–15.5)
WBC: 7.7 10*3/uL (ref 4.0–10.5)

## 2017-11-29 LAB — BRAIN NATRIURETIC PEPTIDE: PRO B NATRI PEPTIDE: 11 pg/mL (ref 0.0–100.0)

## 2017-11-29 LAB — TSH: TSH: 3.57 u[IU]/mL (ref 0.35–4.50)

## 2017-11-29 LAB — COMPREHENSIVE METABOLIC PANEL
ALT: 8 U/L (ref 0–53)
AST: 12 U/L (ref 0–37)
Albumin: 3.9 g/dL (ref 3.5–5.2)
Alkaline Phosphatase: 53 U/L (ref 39–117)
BILIRUBIN TOTAL: 0.3 mg/dL (ref 0.2–1.2)
BUN: 34 mg/dL — AB (ref 6–23)
CO2: 24 mEq/L (ref 19–32)
Calcium: 9.3 mg/dL (ref 8.4–10.5)
Chloride: 108 mEq/L (ref 96–112)
Creatinine, Ser: 2.06 mg/dL — ABNORMAL HIGH (ref 0.40–1.50)
GFR: 33.38 mL/min — AB (ref >60.00)
GLUCOSE: 70 mg/dL (ref 70–99)
Potassium: 4.7 mEq/L (ref 3.5–5.1)
SODIUM: 138 meq/L (ref 135–145)
TOTAL PROTEIN: 6.7 g/dL (ref 6.0–8.3)

## 2017-11-29 MED ORDER — TORSEMIDE 20 MG PO TABS: 20.0000 mg | ORAL_TABLET | Freq: Every day | ORAL | 5 refills | Status: DC | PRN

## 2017-11-29 NOTE — Patient Instructions (Addendum)
Please stop by lab before you go as well as x-ray  I need to figure out where your kidney function stands today to determine next steps. Hoping to have all results for you by Thursday at latest  Id like to reassess in 2-4 weeks. Going to have to do the therapy to get stronger   Hold off on driving until reassessment at least

## 2017-11-29 NOTE — Progress Notes (Addendum)
Subjective:  [name] is a 77 y.o. year old very pleasant male patient who presents for/with See problem oriented charting ROS- significant edema, congestion in throat. No fever or chills. Some shortness of breath.   Past Medical History-  Patient Active Problem List   Diagnosis Date Noted  . Mild depressed bipolar I disorder ([place]) 09/21/2006    Priority: High  . Chronic low back pain 03/08/2017    Priority: Medium  . Gout 07/07/2016    Priority: Medium  . Fatty liver 10/09/2014    Priority: Medium  . Obstructive sleep apnea 10/30/2008    Priority: Medium  . Osteoporosis 09/25/2008    Priority: Medium  . Chronic kidney disease (CKD), stage III (moderate) (HCC) 03/01/2008    Priority: Medium  . Senile purpura ([place]) 06/22/2017    Priority: Low  . Hypersomnolence 08/20/2016    Priority: Low  . Constipation 01/13/2016    Priority: Low  . Primary osteoarthritis of right knee 12/08/2015    Priority: Low  . Gallstones 10/09/2014    Priority: Low  . Bilateral lower extremity edema 09/25/2014    Priority: Low  . Chronic venous insufficiency 08/29/2013    Priority: Low  . Right shoulder pain 08/29/2013    Priority: Low  . Closed left hip fracture, initial encounter ([place]) 11/30/2017  . Osteoarthritis of spine 03/21/2017    Medications- reviewed and updated Current Outpatient Medications  Medication Sig Dispense Refill  . acetaminophen (TYLENOL 8 HOUR) 650 MG CR tablet Take 650 mg by mouth every 8 (eight) hours as needed for pain.    . Aripiprazole (ABILIFY) 5 MG tablet Take 1 tablet (5 mg total) by mouth daily. (Patient taking differently: Take 2.5 mg by mouth daily. ) 90 tablet 3  . Calcium-Vitamin D (CALTRATE 600 PLUS-VIT D PO) Take 1 tablet by mouth daily.     . Cholecalciferol (PA VITAMIN D-3 GUMMY PO) Take 2 tablets by mouth 2 (two) times daily.    . febuxostat (ULORIC) 40 MG tablet TAKE 1 TABLET(40 MG) BY MOUTH DAILY 30 tablet 1  . febuxostat (ULORIC) 40 MG tablet  TAKE 1 TABLET BY MOUTH DAILY 30 tablet 0  . Iron Combinations (CHROMAGEN) capsule Take 1 capsule by mouth daily.    Marland Kitchen L-Methylfolate-Algae (L-METHYLFOLATE FORTE) 15-90.314 MG CAPS Take 1 capsule by mouth daily.  0  . mometasone (ELOCON) 0.1 % cream     . tranylcypromine (PARNATE) 10 MG tablet Take 30 mg by mouth 2 (two) times daily.      No current facility-administered medications for this visit.     Objective: BP (!) 142/92   Pulse 78   Temp 97.7 F (36.5 C) (Oral)   Ht 5\' 4"  (1.626 m)   Wt 198 lb 12.8 oz (90.2 kg)   SpO2 98%   BMI 34.12 kg/m  Gen: NAD, resting comfortably TM normal, pharynx mild erythema, nasal sounding breathing- almost sounds like snoring (in relation to patient reported congestion) CV: RRR no murmurs rubs or gallops Lungs: CTAB no crackles, wheeze, rhonchi Abdomen: soft/nontender/nondistended/normal bowel sounds.  Ext: 3+ edema Skin: warm, dry  Assessment/Plan:  Hospitalization x2 follow up for left hip fracture, now dealing with profound edema S:  From patients perspective and various records he brings in-  June 13th went up to [place] to see daughter. Went to shake son in laws hand when coming downstairs- missed bottom step - fracture of left femur and hip- he reports multiple complications in hospital- was told was near death  per patient. Had loss of kidney function with Cr up to 2.5 and apparently GFR went below 30 though reports recent recovery and creatinine closer to 2 he believes. Has had issues with edema- treated with iv lasix  At times was on which helped but then not on lasix since that time. He had also been on torsemide- he is not sure why he is off of those at present. Saw renal on 10/21/17 by Dr. Maudie Mercury out of state- they noted AKI, CKD III, anasarca and advised torsemide 20mg  [facility] from [facility] in [place], [place]. He doesn't elevate legs- has stopped using leg wraps that he was given- pressure socks not helping either. Swelling worse  with flying- came back last Wednesday the 4th. Echocardiogram reportedly was normal -  Apparently had this test twice. Apparently had kidney ultrasound without obvious findings and no hydronephrosis from 1 note I was able to see from outside records. He has some continued shortness of breath. From outside notes while hospitalized "* Shortness of breath  Assessment & Plan  Patient with persistent worsening SOB since surgery for broken hip in June as well as worsening LE edema refractory to torsemide 20 daily, c/f pHTN (with hx of OSA)"  He does bring a d/c summary from [facility]- which states patient admitted 09/12/17 to 10/02/17- states 09/01/17 hospitalized and required CPAP/BiPAP in ICU associated with bilateral atelectasis and hypoventilation associated with obesity. They also note fever, anasarca, leukocytosis, abnormal LFT's, and post operative ileus. They note 5 days IV antibiotics- fever, leukocytosis and metabolic acidosis resolved though unknown source. Had AKI on top of CKD- nephrology thought multifactorial tubular necrosis- he needed IV diuresis and cardiology helped manage- they note echo with Grade I diastolic dysfunction only. He had sedation with narcotics and they tried to wean. He had some swallowing issues and had modified diet recs at the time. Looks like had 2 separate courses of lasix- one for 3 days decreasing weight by 3 lbs but later needing a longer 5 day course- not clear if this is at nursing home or in hospital. Due to IV diuresis they labeled him as having diastolic CHF or HFPEF. Also with venous insufficieny was continued on daily weights, ace wraps, elevation (patient admits hasn't done well with this since he has been home)   He ended up with Right heart cath after 5 days in 1 hospitalization was reassuring reportedly   Outside of edema, has  Continued nasal congestion as well as upper chest congestion - apparently since surgery- not on antibiotics for it.   For his  OSA- CPAP stopped functioning while there- really cold air and ended up needing a new machine.   No recent gout flares on uloric- this was continued once discharged but was on allopurinol in ospital  He has some constipation which colace seems to help  His bipolar was maintained on his abilify and parnate  10/02/17 med list- uloric 40mg , abilify 2.5mg , gabapentin 100mg  TID, lasix 40mg , parnate A/P: For cough/congestion for over a month- no focal findings today- will trial augmentin  Closed left hip fracture, initial encounter [facility]) Patient in healing process but needs to continue PT due to debility after injury. Was to transfer care to [facility] here locally- will refer to home health at this time- see orders  Chronic kidney disease (CKD), stage III (moderate) ([place]) Patient had worsening renal function while in hospital but fortunately has improved from below 30 for GFR range. Has profound edema and we will  trial torsemide- would advise 1 week recheck on potassium as may need supplementation. May need sooner follow up with [facility]/Dr. Florene Glen  Osteoporosis Osteoporosis contributed to fracture obviously. He is on calcium/vitamin D and prolia and should continue  Obstructive sleep apnea Has new machine and finds helpful  Fatty liver Lab Results  Component Value Date   ALT 8 11/29/2017   AST 12 11/29/2017   ALKPHOS 53 11/29/2017   BILITOT 0.3 11/29/2017  luckily LFTs ok- needs to pursue weight loss- right no wnot ideal time for increased activity  Chronic venous insufficiency Baseline chronic insufficiency of venous system certainly contributes to edema  (HFpEF) heart failure with preserved ejection fraction [facility]) Patient diagnosed out of state with diastolic CHF or HFpEF. We will restart torsemide 20mg  and watch kidney function closely. With renal issues- managing this may be a challenge- may need to get him back into [facility] and may need to refer to local  cardiology   Future Appointments  Date Time Provider [place]  12/13/2017  9:00 AM [name], MD [facility]  12/21/2017 10:00 AM [name], MD [facility]   Lab/Order associations: Edema, unspecified type - Plan: CBC with Differential/Platelet, Comprehensive metabolic panel, TSH, B Nat Peptide, Basic metabolic panel, CBC, Ambulatory referral to [place], CANCELED: Ambulatory referral to [place] of breath - Plan: B Nat Peptide, DG Chest 2 View  Closed left hip fracture, initial encounter ([place]) - Plan: Ambulatory referral to [place], CANCELED: DG Bone Density, CANCELED: Ambulatory referral to [place]  Chronic kidney disease (CKD), stage III (moderate) (HCC)  Age-related osteoporosis without current pathological fracture  Obstructive sleep apnea  Fatty liver  Chronic venous insufficiency  Meds ordered this encounter  Medications  . torsemide (DEMADEX) 20 MG tablet    Sig: Take 1 tablet (20 mg total) by mouth daily as needed (edema, weight gain).    Dispense:  30 tablet    Refill:  5  . amoxicillin-clavulanate (AUGMENTIN) 875-125 MG tablet    Sig: Take 1 tablet by mouth 2 (two) times daily for 7 days.    Dispense:  14 tablet    Refill:  0   Time Stamp The duration of face-to-face time during this visit was greater than 40 minutes (11:27 AM- 12:10 PM) . Greater than 50% of this time was spent in counseling, explanation of diagnosis, planning of further management, and/or coordination of care including counseling over stressors of prolonged illness/recovery, discussing next steps, discussing difficulty of making decisions without more lab work, advising leg elevation several times, discussing why driving not best idea at present.   Return precautions advised.  Garret Reddish, MD

## 2017-11-29 NOTE — Telephone Encounter (Signed)
Copied from [place] [id number]. Topic: Quick Communication - See Telephone Encounter >> Nov 29, 2017  9:31 AM Bea Graff, NT wrote: CRM for notification. See Telephone encounter for: 11/29/17. Agapito Games with [facility] calling and states they will be needing orders nursing, PT, OT, and Education officer, museum. CB#: [phone]

## 2017-11-29 NOTE — Telephone Encounter (Signed)
Called and provided verbal order as requested 

## 2017-11-29 NOTE — Telephone Encounter (Signed)
Can provide verbal- I will put in home health orders and face to face today

## 2017-11-29 NOTE — Telephone Encounter (Signed)
Dr. Yong Channel-  Please see message

## 2017-11-30 ENCOUNTER — Encounter: Payer: Self-pay | Admitting: Family Medicine

## 2017-11-30 DIAGNOSIS — I5032 Chronic diastolic (congestive) heart failure: Secondary | ICD-10-CM | POA: Insufficient documentation

## 2017-11-30 DIAGNOSIS — I5189 Other ill-defined heart diseases: Secondary | ICD-10-CM | POA: Insufficient documentation

## 2017-11-30 DIAGNOSIS — S72002A Fracture of unspecified part of neck of left femur, initial encounter for closed fracture: Secondary | ICD-10-CM | POA: Insufficient documentation

## 2017-11-30 MED ORDER — AMOXICILLIN-POT CLAVULANATE 875-125 MG PO TABS: 1.0000 | ORAL_TABLET | Freq: Two times a day (BID) | ORAL | 0 refills | Status: AC

## 2017-11-30 NOTE — Assessment & Plan Note (Signed)
Patient in healing process but needs to continue PT due to debility after injury. Was to transfer care to [facility] here locally- will refer to home health at this time- see orders

## 2017-11-30 NOTE — Assessment & Plan Note (Signed)
Lab Results  Component Value Date   ALT 8 11/29/2017   AST 12 11/29/2017   ALKPHOS 53 11/29/2017   BILITOT 0.3 11/29/2017  luckily LFTs ok- needs to pursue weight loss- right no wnot ideal time for increased activity

## 2017-11-30 NOTE — Assessment & Plan Note (Signed)
Baseline chronic insufficiency of venous system certainly contributes to edema

## 2017-11-30 NOTE — Assessment & Plan Note (Signed)
Has new machine and finds helpful

## 2017-11-30 NOTE — Assessment & Plan Note (Signed)
Patient diagnosed [place] of [place] with diastolic CHF or HFpEF. We will restart torsemide 20mg  and watch kidney function closely. With renal issues- managing this may be a challenge- may need to get him back into [facility] and may need to refer to local cardiology

## 2017-11-30 NOTE — Assessment & Plan Note (Signed)
Patient had worsening renal function while in hospital but fortunately has improved from below 30 for GFR range. Has profound edema and we will trial torsemide- would advise 1 week recheck on potassium as may need supplementation. May need sooner follow up with [facility]/Dr. Florene Glen

## 2017-11-30 NOTE — Assessment & Plan Note (Signed)
Osteoporosis contributed to fracture obviously. He is on calcium/vitamin D and prolia and should continue

## 2017-12-01 DIAGNOSIS — I872 Venous insufficiency (chronic) (peripheral): Secondary | ICD-10-CM | POA: Diagnosis not present

## 2017-12-01 DIAGNOSIS — S72002D Fracture of unspecified part of neck of left femur, subsequent encounter for closed fracture with routine healing: Secondary | ICD-10-CM | POA: Diagnosis not present

## 2017-12-01 DIAGNOSIS — S7222XD Displaced subtrochanteric fracture of left femur, subsequent encounter for closed fracture with routine healing: Secondary | ICD-10-CM | POA: Diagnosis not present

## 2017-12-01 DIAGNOSIS — N183 Chronic kidney disease, stage 3 (moderate): Secondary | ICD-10-CM | POA: Diagnosis not present

## 2017-12-01 DIAGNOSIS — I129 Hypertensive chronic kidney disease with stage 1 through stage 4 chronic kidney disease, or unspecified chronic kidney disease: Secondary | ICD-10-CM | POA: Diagnosis not present

## 2017-12-02 ENCOUNTER — Telehealth: Payer: Self-pay | Admitting: Family Medicine

## 2017-12-02 NOTE — Telephone Encounter (Signed)
See note

## 2017-12-02 NOTE — Telephone Encounter (Signed)
Copied from [place] [id number]. Topic: Quick Communication - See Telephone Encounter >> Dec 02, 2017  3:39 PM Reyne Dumas L wrote: CRM for notification. See Telephone encounter for: 12/02/17.  Simona Huh from [facility] calling to get verbal orders for PT:  Gait training, balance, endurance, stair training. 2x a week for four weeks Simona Huh can be reached at [phone], OK to leave a message

## 2017-12-05 ENCOUNTER — Telehealth: Payer: Self-pay

## 2017-12-05 NOTE — Telephone Encounter (Signed)
Copied from [place] [id number]. Topic: General - Other >> Dec 05, 2017  9:06 AM Judyann Munson wrote: Reason for CRM: Patient is calling to request a call back in regards to his X-Ray results. Please advise

## 2017-12-06 ENCOUNTER — Telehealth: Payer: Self-pay | Admitting: Family Medicine

## 2017-12-06 DIAGNOSIS — S7222XD Displaced subtrochanteric fracture of left femur, subsequent encounter for closed fracture with routine healing: Secondary | ICD-10-CM | POA: Diagnosis not present

## 2017-12-06 DIAGNOSIS — I129 Hypertensive chronic kidney disease with stage 1 through stage 4 chronic kidney disease, or unspecified chronic kidney disease: Secondary | ICD-10-CM | POA: Diagnosis not present

## 2017-12-06 DIAGNOSIS — S72002D Fracture of unspecified part of neck of left femur, subsequent encounter for closed fracture with routine healing: Secondary | ICD-10-CM | POA: Diagnosis not present

## 2017-12-06 DIAGNOSIS — I872 Venous insufficiency (chronic) (peripheral): Secondary | ICD-10-CM | POA: Diagnosis not present

## 2017-12-06 DIAGNOSIS — N183 Chronic kidney disease, stage 3 (moderate): Secondary | ICD-10-CM | POA: Diagnosis not present

## 2017-12-06 NOTE — Telephone Encounter (Signed)
Copied from [place] [id number]. Topic: Inquiry >> Dec 06, 2017 11:54 AM Scherrie Gerlach wrote: Reason for CRM: Agapito Games with [facility] calling to let the dr know there are med discrepancies in the home for what pt is taking and what is on his meds from [facility]. Pt in not taking  1.Calcium-Vitamin D (CALTRATE 600 PLUS-VIT D PO) or 2. Iron Combinations (CHROMAGEN) capsule Pt was also prescribed gabapentin (NEURONTIN) capsule 100 mg TID when he went to [place] and was in the hospital and this was not on med list. Pt reports to her it is really helping with his nerves. Would like to stay on this med. She needs clarification

## 2017-12-06 NOTE — Telephone Encounter (Signed)
Called and provided verbal order as requested 

## 2017-12-06 NOTE — Telephone Encounter (Signed)
Shraddha with [facility] is calling back pt has torsemide 20 mg one tablet daily as needed for weight gain and swelling in his legs. This med was prescribed by dr Retail banker. Please verify pt should be taking these medication

## 2017-12-07 NOTE — Telephone Encounter (Signed)
Yes thanks, we were planning on using torsemide to help with edema- are they concerned abut this medication?

## 2017-12-07 NOTE — Telephone Encounter (Signed)
Spoke to pt asked him if taking Torsemide 20 mg? Pt said yes he is and it is helping with the swelling. Asked pt if taking one tablet daily. Pt said yes. Told pt okay call if any problems. Pt verbalized understanding.

## 2017-12-07 NOTE — Telephone Encounter (Signed)
Spoke to pt, told him results were give to him on 9/12. Pt said he just wanted to make sure it was okay. Told pt per Dr. Sterling Big without obvious fluid in lungs. No pneumonia or obvious bronchitis either. Pt verbalized understanding.

## 2017-12-08 ENCOUNTER — Other Ambulatory Visit (INDEPENDENT_AMBULATORY_CARE_PROVIDER_SITE_OTHER): Payer: Medicare Other

## 2017-12-08 DIAGNOSIS — R609 Edema, unspecified: Secondary | ICD-10-CM | POA: Diagnosis not present

## 2017-12-08 LAB — CBC
HCT: 36.8 % — ABNORMAL LOW (ref 39.0–52.0)
HEMOGLOBIN: 12.3 g/dL — AB (ref 13.0–17.0)
MCHC: 33.4 g/dL (ref 30.0–36.0)
MCV: 91.6 fl (ref 78.0–100.0)
Platelets: 290 10*3/uL (ref 150.0–400.0)
RBC: 4.02 Mil/uL — AB (ref 4.22–5.81)
RDW: 12.7 % (ref 11.5–15.5)
WBC: 7.1 10*3/uL (ref 4.0–10.5)

## 2017-12-08 LAB — BASIC METABOLIC PANEL
BUN: 59 mg/dL — ABNORMAL HIGH (ref 6–23)
CHLORIDE: 101 meq/L (ref 96–112)
CO2: 31 meq/L (ref 19–32)
Calcium: 9.5 mg/dL (ref 8.4–10.5)
Creatinine, Ser: 2.77 mg/dL — ABNORMAL HIGH (ref 0.40–1.50)
GFR: 23.71 mL/min — ABNORMAL LOW (ref >60.00)
Glucose, Bld: 115 mg/dL — ABNORMAL HIGH (ref 70–99)
Potassium: 3.9 mEq/L (ref 3.5–5.1)
Sodium: 141 mEq/L (ref 135–145)

## 2017-12-09 ENCOUNTER — Other Ambulatory Visit: Payer: Medicare Other

## 2017-12-09 DIAGNOSIS — S72002D Fracture of unspecified part of neck of left femur, subsequent encounter for closed fracture with routine healing: Secondary | ICD-10-CM | POA: Diagnosis not present

## 2017-12-09 DIAGNOSIS — I129 Hypertensive chronic kidney disease with stage 1 through stage 4 chronic kidney disease, or unspecified chronic kidney disease: Secondary | ICD-10-CM | POA: Diagnosis not present

## 2017-12-09 DIAGNOSIS — I872 Venous insufficiency (chronic) (peripheral): Secondary | ICD-10-CM | POA: Diagnosis not present

## 2017-12-09 DIAGNOSIS — S7222XD Displaced subtrochanteric fracture of left femur, subsequent encounter for closed fracture with routine healing: Secondary | ICD-10-CM | POA: Diagnosis not present

## 2017-12-09 DIAGNOSIS — N183 Chronic kidney disease, stage 3 (moderate): Secondary | ICD-10-CM | POA: Diagnosis not present

## 2017-12-12 ENCOUNTER — Telehealth: Payer: Self-pay | Admitting: Family Medicine

## 2017-12-12 ENCOUNTER — Ambulatory Visit: Payer: Medicare Other | Admitting: Family Medicine

## 2017-12-12 DIAGNOSIS — S72002D Fracture of unspecified part of neck of left femur, subsequent encounter for closed fracture with routine healing: Secondary | ICD-10-CM | POA: Diagnosis not present

## 2017-12-12 DIAGNOSIS — S7222XD Displaced subtrochanteric fracture of left femur, subsequent encounter for closed fracture with routine healing: Secondary | ICD-10-CM | POA: Diagnosis not present

## 2017-12-12 DIAGNOSIS — I872 Venous insufficiency (chronic) (peripheral): Secondary | ICD-10-CM | POA: Diagnosis not present

## 2017-12-12 DIAGNOSIS — I129 Hypertensive chronic kidney disease with stage 1 through stage 4 chronic kidney disease, or unspecified chronic kidney disease: Secondary | ICD-10-CM | POA: Diagnosis not present

## 2017-12-12 DIAGNOSIS — N183 Chronic kidney disease, stage 3 (moderate): Secondary | ICD-10-CM | POA: Diagnosis not present

## 2017-12-12 NOTE — Telephone Encounter (Signed)
Copied from [place] [id number]. Topic: Referral - Status >> Dec 12, 2017 11:03 AM Lennox Solders wrote: Reason for CRM:pt is calling and dr Florene Glen needs the last blood work result fax to them [phone] The phone number is [phone]. Pt has an appt on 12-21-17 . Pt is trying to get a sooner appt

## 2017-12-13 ENCOUNTER — Encounter: Payer: Self-pay | Admitting: Family Medicine

## 2017-12-13 ENCOUNTER — Ambulatory Visit: Payer: Medicare Other | Admitting: Family Medicine

## 2017-12-13 ENCOUNTER — Ambulatory Visit (INDEPENDENT_AMBULATORY_CARE_PROVIDER_SITE_OTHER): Payer: Medicare Other | Admitting: Family Medicine

## 2017-12-13 VITALS — BP 128/82 | HR 87 | Temp 98.2°F | Ht 64.0 in | Wt 195.8 lb

## 2017-12-13 DIAGNOSIS — Z23 Encounter for immunization: Secondary | ICD-10-CM | POA: Diagnosis not present

## 2017-12-13 DIAGNOSIS — I5033 Acute on chronic diastolic (congestive) heart failure: Secondary | ICD-10-CM

## 2017-12-13 DIAGNOSIS — N183 Chronic kidney disease, stage 3 unspecified: Secondary | ICD-10-CM

## 2017-12-13 DIAGNOSIS — M545 Low back pain: Secondary | ICD-10-CM | POA: Diagnosis not present

## 2017-12-13 DIAGNOSIS — G8929 Other chronic pain: Secondary | ICD-10-CM | POA: Diagnosis not present

## 2017-12-13 NOTE — Assessment & Plan Note (Signed)
S: CKD likely due to prior long term lithium use for bipolar. He sees Dr. Florene Glen tomorrow fortunately- as noted in HFpEF section - GFR dropped into low 20s from low 30s (Cr up to 2.7 from 2) with addition of torsemide daily but I think he may ned this for his heart failure issues. We are going to have to find balance between diuresis and his kidneys and edema.  A/P: I printed off a copy of patients last 2 bmets/GFR measurements- he is going to take these to Dr. Florene Glen tomorrow- likely will get more labs tomorrow as well. For now hold off on torsemide until we get Dr. Abel Presto expert opinion.

## 2017-12-13 NOTE — Progress Notes (Signed)
Subjective:  [name] is a 77 y.o. year old very pleasant male patient who presents for/with See problem oriented charting ROS- still with hip pain, also with some left neck pain. Edema was improving but not wosening off torsemide. Breathing improved. Weight was going down, now increasing off torsemide    Past Medical History-  Patient Active Problem List   Diagnosis Date Noted  . (HFpEF) heart failure with preserved ejection fraction ([place]) 11/30/2017    Priority: High  . Mild depressed bipolar I disorder ([place]) 09/21/2006    Priority: High  . Chronic low back pain 03/08/2017    Priority: Medium  . Gout 07/07/2016    Priority: Medium  . Fatty liver 10/09/2014    Priority: Medium  . Obstructive sleep apnea 10/30/2008    Priority: Medium  . Osteoporosis 09/25/2008    Priority: Medium  . Chronic kidney disease (CKD), stage III (moderate) (HCC) 03/01/2008    Priority: Medium  . Senile purpura ([place]) 06/22/2017    Priority: Low  . Hypersomnolence 08/20/2016    Priority: Low  . Constipation 01/13/2016    Priority: Low  . Primary osteoarthritis of right knee 12/08/2015    Priority: Low  . Gallstones 10/09/2014    Priority: Low  . Bilateral lower extremity edema 09/25/2014    Priority: Low  . Chronic venous insufficiency 08/29/2013    Priority: Low  . Right shoulder pain 08/29/2013    Priority: Low  . Closed left hip fracture, initial encounter ([place]) 11/30/2017  . Osteoarthritis of spine 03/21/2017    Medications- reviewed and updated Current Outpatient Medications  Medication Sig Dispense Refill  . acetaminophen (TYLENOL 8 HOUR) 650 MG CR tablet Take 650 mg by mouth every 8 (eight) hours as needed for pain.    . ARIPiprazole (ABILIFY) 5 MG tablet Take 1 tablet (5 mg total) by mouth daily. (Patient taking differently: Take 2.5 mg by mouth daily. ) 90 tablet 3  . Calcium-Vitamin D (CALTRATE 600 PLUS-VIT D PO) Take 1 tablet by mouth daily.     . Cholecalciferol (PA VITAMIN  D-3 GUMMY PO) Take 2 tablets by mouth 2 (two) times daily.    . febuxostat (ULORIC) 40 MG tablet TAKE 1 TABLET(40 MG) BY MOUTH DAILY 30 tablet 1  . febuxostat (ULORIC) 40 MG tablet TAKE 1 TABLET BY MOUTH DAILY 30 tablet 0  . Iron Combinations (CHROMAGEN) capsule Take 1 capsule by mouth daily.    Marland Kitchen L-Methylfolate-Algae (L-METHYLFOLATE FORTE) 15-90.314 MG CAPS Take 1 capsule by mouth daily.  0  . mometasone (ELOCON) 0.1 % cream     . tranylcypromine (PARNATE) 10 MG tablet Take 30 mg by mouth 2 (two) times daily.      No current facility-administered medications for this visit.     Objective: BP 128/82 (BP Location: Right Arm, Patient Position: Sitting, Cuff Size: Normal)   Pulse 87   Temp 98.2 F (36.8 C) (Oral)   Ht 5\' 4"  (1.626 m)   Wt 195 lb 12.8 oz (88.8 kg)   SpO2 99%   BMI 33.61 kg/m  Gen: NAD, resting comfortably Some nasal sounding breathing but much improved CV: RRR no murmurs rubs or gallops Lungs: CTAB no crackles, wheeze, rhonchi. Some trasnmitted upper airway sounds Abdomen: soft/nontender/nondistended/normal bowel sounds. No rebound or guarding.  Ext: 2+ edema, down from 3+ Skin: warm, dry Neuro: antalgic gait- walks slowly but able to without assistive device.   Assessment/Plan:  (HFpEF) heart failure with preserved ejection fraction (HCC) S: patients edema  and weight did well on daily torsemide 20mg . Even after stopping torsemide (due to GFR increase of about 10) his weight is still down 3 lbs compared to lats visit (though states gained some after stopping). Edema still present but better- was so profound before that he had trouble lifting his legs. He is breathing better after use. He is walking better due to improved SOB but also improved edema.  A/P: I told patient I would like cardiology's input as well as renal (sees Dr. Florene Glen tomorrow) as far as diuretic dose. I will refer him to cardiology locally today (previously by cardiology when seen out of state and  hospitalized)  Chronic kidney disease (CKD), stage III (moderate) ([place]) S: CKD likely due to prior long term lithium use for bipolar. He sees Dr. Florene Glen tomorrow fortunately- as noted in HFpEF section - GFR dropped into low 20s from low 30s (Cr up to 2.7 from 2) with addition of torsemide daily but I think he may ned this for his heart failure issues. We are going to have to find balance between diuresis and his kidneys and edema.  A/P: I printed off a copy of patients last 2 bmets/GFR measurements- he is going to take these to Dr. Florene Glen tomorrow- likely will get more labs tomorrow as well. For now hold off on torsemide until we get Dr. Abel Presto expert opinion.    Chronic low back pain S: patient with continued back pain. States he is considering surgery- I told him with where kidneys/edema are right now that he is not the best candidate- we need to find the right balance on diuretics first- he agrees after conversation. Continued low back pain  Also has some neck pain/tension to left side.  A/P: we discussed neck pain portion is likely related to cervical OA and some muscular strain as well- discussed conservative care with ice or heat- he agrees to trial this. He agrees also to focus on getting kidneys/heart in better position before considering any surgery for lumbar spine    Future Appointments  Date Time Provider [place]  01/18/2018 11:15 AM [name], MD [facility]   1 month follow up   Lab/Order associations: Need for prophylactic vaccination and inoculation against influenza - Plan: Flu vaccine HIGH DOSE PF  Acute on chronic heart failure with preserved ejection fraction ([place]) - Plan: Ambulatory referral to Cardiology  Chronic kidney disease (CKD), stage III (moderate) ([place])  Chronic bilateral low back pain, with sciatica presence unspecified  Return precautions advised.  Garret Reddish, MD

## 2017-12-13 NOTE — Patient Instructions (Addendum)
Health Maintenance Due  Topic Date Due  . INFLUENZA VACCINE -flu shot today 10/20/2017   Lets get Dr. Abel Presto opinion on torsemide- I am suspecting you may need to go back on torsemide  I want you to ice your neck 3x a day for 20 minutes when flares up sudddently but right now since its been hurting you for months-I want you to try heat 3x a day for 20 minutes.

## 2017-12-13 NOTE — Assessment & Plan Note (Signed)
S: patients edema and weight did well on daily torsemide 20mg . Even after stopping torsemide (due to GFR increase of about 10) his weight is still down 3 lbs compared to lats visit (though states gained some after stopping). Edema still present but better- was so profound before that he had trouble lifting his legs. He is breathing better after use. He is walking better due to improved SOB but also improved edema.  A/P: I told patient I would like cardiology's input as well as renal (sees Dr. Florene Glen tomorrow) as far as diuretic dose. I will refer him to cardiology locally today (previously by cardiology when seen out of state and hospitalized)

## 2017-12-13 NOTE — Assessment & Plan Note (Signed)
S: patient with continued back pain. States he is considering surgery- I told him with where kidneys/edema are right now that he is not the best candidate- we need to find the right balance on diuretics first- he agrees after conversation. Continued low back pain  Also has some neck pain/tension to left side.  A/P: we discussed neck pain portion is likely related to cervical OA and some muscular strain as well- discussed conservative care with ice or heat- he agrees to trial this. He agrees also to focus on getting kidneys/heart in better position before considering any surgery for lumbar spine

## 2017-12-14 DIAGNOSIS — N183 Chronic kidney disease, stage 3 (moderate): Secondary | ICD-10-CM | POA: Diagnosis not present

## 2017-12-14 DIAGNOSIS — I129 Hypertensive chronic kidney disease with stage 1 through stage 4 chronic kidney disease, or unspecified chronic kidney disease: Secondary | ICD-10-CM | POA: Diagnosis not present

## 2017-12-14 DIAGNOSIS — K760 Fatty (change of) liver, not elsewhere classified: Secondary | ICD-10-CM | POA: Diagnosis not present

## 2017-12-14 DIAGNOSIS — I872 Venous insufficiency (chronic) (peripheral): Secondary | ICD-10-CM | POA: Diagnosis not present

## 2017-12-14 DIAGNOSIS — M810 Age-related osteoporosis without current pathological fracture: Secondary | ICD-10-CM | POA: Diagnosis not present

## 2017-12-14 DIAGNOSIS — R609 Edema, unspecified: Secondary | ICD-10-CM | POA: Diagnosis not present

## 2017-12-14 DIAGNOSIS — S7222XD Displaced subtrochanteric fracture of left femur, subsequent encounter for closed fracture with routine healing: Secondary | ICD-10-CM | POA: Diagnosis not present

## 2017-12-14 DIAGNOSIS — S7292XA Unspecified fracture of left femur, initial encounter for closed fracture: Secondary | ICD-10-CM | POA: Diagnosis not present

## 2017-12-14 DIAGNOSIS — S72002D Fracture of unspecified part of neck of left femur, subsequent encounter for closed fracture with routine healing: Secondary | ICD-10-CM | POA: Diagnosis not present

## 2017-12-14 NOTE — Telephone Encounter (Signed)
Left detailed message on personal voicemail last lab results faxed to Dr. Abel Presto office as requested. Any questions call office.

## 2017-12-16 DIAGNOSIS — N183 Chronic kidney disease, stage 3 (moderate): Secondary | ICD-10-CM | POA: Diagnosis not present

## 2017-12-16 DIAGNOSIS — G4733 Obstructive sleep apnea (adult) (pediatric): Secondary | ICD-10-CM

## 2017-12-16 DIAGNOSIS — M810 Age-related osteoporosis without current pathological fracture: Secondary | ICD-10-CM

## 2017-12-16 DIAGNOSIS — M47819 Spondylosis without myelopathy or radiculopathy, site unspecified: Secondary | ICD-10-CM

## 2017-12-16 DIAGNOSIS — M109 Gout, unspecified: Secondary | ICD-10-CM

## 2017-12-16 DIAGNOSIS — H9190 Unspecified hearing loss, unspecified ear: Secondary | ICD-10-CM

## 2017-12-16 DIAGNOSIS — Z6834 Body mass index (BMI) 34.0-34.9, adult: Secondary | ICD-10-CM

## 2017-12-16 DIAGNOSIS — F319 Bipolar disorder, unspecified: Secondary | ICD-10-CM

## 2017-12-16 DIAGNOSIS — I872 Venous insufficiency (chronic) (peripheral): Secondary | ICD-10-CM | POA: Diagnosis not present

## 2017-12-16 DIAGNOSIS — I129 Hypertensive chronic kidney disease with stage 1 through stage 4 chronic kidney disease, or unspecified chronic kidney disease: Secondary | ICD-10-CM | POA: Diagnosis not present

## 2017-12-16 DIAGNOSIS — S7222XD Displaced subtrochanteric fracture of left femur, subsequent encounter for closed fracture with routine healing: Secondary | ICD-10-CM | POA: Diagnosis not present

## 2017-12-16 DIAGNOSIS — S72002D Fracture of unspecified part of neck of left femur, subsequent encounter for closed fracture with routine healing: Secondary | ICD-10-CM | POA: Diagnosis not present

## 2017-12-16 DIAGNOSIS — E669 Obesity, unspecified: Secondary | ICD-10-CM

## 2017-12-16 DIAGNOSIS — Z9181 History of falling: Secondary | ICD-10-CM

## 2017-12-21 ENCOUNTER — Ambulatory Visit: Payer: Medicare Other | Admitting: Family Medicine

## 2017-12-22 ENCOUNTER — Ambulatory Visit: Payer: Medicare Other | Admitting: Family Medicine

## 2017-12-22 DIAGNOSIS — S72002D Fracture of unspecified part of neck of left femur, subsequent encounter for closed fracture with routine healing: Secondary | ICD-10-CM | POA: Diagnosis not present

## 2017-12-22 DIAGNOSIS — I129 Hypertensive chronic kidney disease with stage 1 through stage 4 chronic kidney disease, or unspecified chronic kidney disease: Secondary | ICD-10-CM | POA: Diagnosis not present

## 2017-12-22 DIAGNOSIS — I872 Venous insufficiency (chronic) (peripheral): Secondary | ICD-10-CM | POA: Diagnosis not present

## 2017-12-22 DIAGNOSIS — S7222XD Displaced subtrochanteric fracture of left femur, subsequent encounter for closed fracture with routine healing: Secondary | ICD-10-CM | POA: Diagnosis not present

## 2017-12-22 DIAGNOSIS — N183 Chronic kidney disease, stage 3 (moderate): Secondary | ICD-10-CM | POA: Diagnosis not present

## 2017-12-28 DIAGNOSIS — G4733 Obstructive sleep apnea (adult) (pediatric): Secondary | ICD-10-CM | POA: Diagnosis not present

## 2017-12-29 ENCOUNTER — Encounter: Payer: Self-pay | Admitting: Family Medicine

## 2017-12-30 DIAGNOSIS — N183 Chronic kidney disease, stage 3 (moderate): Secondary | ICD-10-CM | POA: Diagnosis not present

## 2018-01-02 DIAGNOSIS — L820 Inflamed seborrheic keratosis: Secondary | ICD-10-CM | POA: Diagnosis not present

## 2018-01-02 DIAGNOSIS — D485 Neoplasm of uncertain behavior of skin: Secondary | ICD-10-CM | POA: Diagnosis not present

## 2018-01-02 DIAGNOSIS — G4733 Obstructive sleep apnea (adult) (pediatric): Secondary | ICD-10-CM | POA: Diagnosis not present

## 2018-01-05 ENCOUNTER — Other Ambulatory Visit: Payer: Self-pay | Admitting: Family Medicine

## 2018-01-05 NOTE — Telephone Encounter (Signed)
Not on pts med list please advise.

## 2018-01-05 NOTE — Telephone Encounter (Signed)
Please gather more information- when and where prescribed first. Locate in records if possible

## 2018-01-06 NOTE — Telephone Encounter (Signed)
Upon review of patient's chart, noted in 9/10 visit with Dr. Yong Channel: *10/02/17 med list- uloric 40mg , abilify 2.5mg , gabapentin 100mg  TID*   Gabapentin apparently prescribed by physician at [facility] while hospitalized there in June/July 2019 for hip fracture.

## 2018-01-11 ENCOUNTER — Encounter: Payer: Self-pay | Admitting: Cardiology

## 2018-01-11 DIAGNOSIS — M545 Low back pain: Secondary | ICD-10-CM | POA: Diagnosis not present

## 2018-01-11 DIAGNOSIS — M48061 Spinal stenosis, lumbar region without neurogenic claudication: Secondary | ICD-10-CM | POA: Diagnosis not present

## 2018-01-11 DIAGNOSIS — M4316 Spondylolisthesis, lumbar region: Secondary | ICD-10-CM | POA: Diagnosis not present

## 2018-01-12 ENCOUNTER — Other Ambulatory Visit: Payer: Self-pay | Admitting: Neurosurgery

## 2018-01-13 ENCOUNTER — Ambulatory Visit: Payer: Medicare Other | Admitting: Cardiology

## 2018-01-13 ENCOUNTER — Telehealth: Payer: Self-pay | Admitting: Cardiology

## 2018-01-13 ENCOUNTER — Encounter: Payer: Self-pay | Admitting: Cardiology

## 2018-01-13 VITALS — BP 120/66 | HR 107 | Ht 64.0 in | Wt 191.0 lb

## 2018-01-13 DIAGNOSIS — R000 Tachycardia, unspecified: Secondary | ICD-10-CM

## 2018-01-13 DIAGNOSIS — N183 Chronic kidney disease, stage 3 unspecified: Secondary | ICD-10-CM

## 2018-01-13 DIAGNOSIS — I5032 Chronic diastolic (congestive) heart failure: Secondary | ICD-10-CM | POA: Diagnosis not present

## 2018-01-13 DIAGNOSIS — I479 Paroxysmal tachycardia, unspecified: Secondary | ICD-10-CM

## 2018-01-13 DIAGNOSIS — I872 Venous insufficiency (chronic) (peripheral): Secondary | ICD-10-CM

## 2018-01-13 DIAGNOSIS — R609 Edema, unspecified: Secondary | ICD-10-CM | POA: Insufficient documentation

## 2018-01-13 LAB — BASIC METABOLIC PANEL
BUN/Creatinine Ratio: 21 (ref 10–24)
BUN: 58 mg/dL — AB (ref 8–27)
CO2: 24 mmol/L (ref 20–29)
CREATININE: 2.82 mg/dL — AB (ref 0.76–1.27)
Calcium: 9.4 mg/dL (ref 8.6–10.2)
Chloride: 101 mmol/L (ref 96–106)
GFR, EST AFRICAN AMERICAN: 24 mL/min/{1.73_m2} — AB (ref >59)
GFR, EST NON AFRICAN AMERICAN: 21 mL/min/{1.73_m2} — AB (ref >59)
Glucose: 107 mg/dL — ABNORMAL HIGH (ref 65–99)
Potassium: 4 mmol/L (ref 3.5–5.2)
Sodium: 140 mmol/L (ref 134–144)

## 2018-01-13 NOTE — Telephone Encounter (Signed)
New message   Per Pam please fax lab work and patient notes if available from visit. Fax # is [phone].

## 2018-01-13 NOTE — Patient Instructions (Signed)
Medication Instructions:  Your physician recommends that you continue on your current medications as directed. Please refer to the Current Medication list given to you today.  If you need a refill on your cardiac medications before your next appointment, please call your pharmacy.   Lab work: Today: BMET  If you have labs (blood work) drawn today and your tests are completely normal, you will receive your results only by: Marland Kitchen MyChart Message (if you have MyChart) OR . A paper copy in the mail If you have any lab test that is abnormal or we need to change your treatment, we will call you to review the results.  Testing/Procedures: Your physician has recommended that you wear a 24 Hr holter monitor. Holter monitors are medical devices that record the heart's electrical activity. Doctors most often use these monitors to diagnose arrhythmias. Arrhythmias are problems with the speed or rhythm of the heartbeat. The monitor is a small, portable device. You can wear one while you do your normal daily activities. This is usually used to diagnose what is causing palpitations/syncope (passing out).   Follow-Up: At [facility], you and your health needs are our priority.  As part of our continuing mission to provide you with exceptional heart care, we have created designated Provider Care Teams.  These Care Teams include your primary Cardiologist (physician) and Advanced Practice Providers (APPs -  Physician Assistants and Nurse Practitioners) who all work together to provide you with the care you need, when you need it. You will need a follow up appointment in 1 years.  Please call our office 2 months in advance to schedule this appointment.  You may see Dr. Radford Pax or one of the following Advanced Practice Providers on your designated Care Team:   [place], PA-C Melina Copa, PA-C . Ermalinda Barrios, PA-C

## 2018-01-13 NOTE — Progress Notes (Addendum)
Cardiology Office Note    Date:  01/18/2018   ID:  [name], DOB [birth year], MRN [id number]  PCP:  [name], MD  Cardiologist:  Fransico Him, MD   Chief Complaint  Patient presents with  . Congestive Heart Failure    History of Present Illness:  [name] is a 77 y.o. male who is being seen today for the evaluation of chronic diastolic CHF at the request of [name], MD.  This is a 57 chronic diastolic CHF, chronic kidney disease stage III, hypertension bipolar disorder, chronic lower extremity edema on torsemide and chronic low back pain.    He was visiting his daughter in [place] in June and fell and fractured his hip.  He underwent surgical repair which was complicated by respiratory failure requiring BiPAP as well as acute on CKD stage 3 and post op ileus.  He was given a dx of acute diastolic CHF due to findings of diastolic dysfunction on echo but was felt to have resulted due to volume resuscitation with liters of fluid. Cardiology evaluated him during hospital stay and felt that diastolic CHF was a wrong dx.   He went to rehab and had problems with severe LE edema requiring leg wraps. It was felt that his LE edema was likely related to chronic venous stasis, hypoalbuminea, sedentary state and constantly sitting with legs hanging down.   Cxrays showed atelectasis with no effusions or edema.  Due to SOB and ongoing LE edema he was referred to another Cardiologist in [place] for a second opinion.  In review of the records apparently he denied having any SOB but his daughter informed the Cardiologist that his breathing had been terrible.  Echo 10/13/2017 showed small pericardial effusion and normal LVF with EF 70% and normal RVF with dilated non-collapsible IVC.  His BNP was only 17.  Due to inconsistency between data and sx he underwent right heart cath which showed normal filling pressures on 11/15/2017 (CVP was 60mmHg, normal PAP 36/8mmHg with mean  58mmHg, PCWP 2mmHg and CO 5.8L/min). It was felt that his SOB and LE edema were unlikely to be due to a cardiac etiology. He was discharged home and set up to see Pulmonary.  His creatinine bumped from baseline of 2.5 to 2.8 and diuretics were held and creatinine dropped back to 2.6 and he was discharged on Torsemide 20mg  daily and was instructed to followup with his nephrologist in [place]. LE venous dopplers were negative for DVT.  When he was last seen by Renal he was still complaining of lower extremity edema although his weight was down. [facility] manages his diuretics and he was instructed to continue current Torsemide 20mg  daily.  He is here today for further evaluation.  Today he denies any chest pain or pressure, SOB, DOE, PND, orthopnea,  dizziness, palpitations or syncope. He still has some mild LE edema but improved.   Past Medical History:  Diagnosis Date  . Arthritis    back  . Back pain    reason unknown  . Benign prostatic hyperplasia (BPH) with urinary urge incontinence    sees Dr. Jeffie Pollock   . Bipolar 1 disorder [facility])    sees Dr. Norma Fredrickson  . Chronic diastolic CHF (congestive heart failure) ([place]) 11/30/2017   right Heart cath in [facility] 10/2017 showed normal filling pressures.   . CKD (chronic kidney disease)    stage 3 followed by [facility]  . Depression    takes Parnate daily  .  Diverticulitis of colon 05/27/2015  . Essential hypertension 04/11/2007  . Headache(784.0)    occasionally  . Hip fracture ([place]) 09/01/2017   left hip fracture  . History of colon polyps   . Hypothyroidism 10/03/2009   Noted by Dr. Arnoldo Morale- patient states never on medicine- may have been checked due to parnate and abilify   . 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 Joint pain   . Joint swelling   . Osteoporosis    takes Drisdol weekly and Caltrate daily  . Peripheral edema    takes torsemide daily followed by nephrology   . Shingles 05/27/2015  . Sleep apnea    wears c-pap    Past Surgical History:  Procedure Laterality Date  . COLONOSCOPY W/ POLYPECTOMY  03-10-15   per Dr. Havery Moros, adenomatous polyps, repeat in 3 yrs   . HIP FRACTURE SURGERY Left 09/02/2017  . REVERSE SHOULDER ARTHROPLASTY Right 05/22/2013   Procedure: REVERSE SHOULDER ARTHROPLASTY;  Surgeon: Nita Sells, MD;  Location: [facility];  Service: Orthopedics;  Laterality: Right;  Right reverse total shoulder  . TONSILLECTOMY    . TOTAL KNEE ARTHROPLASTY Right 12/08/2015   Procedure: TOTAL KNEE ARTHROPLASTY;  Surgeon: Dorna Leitz, MD;  Location: [facility];  Service: Orthopedics;  Laterality: Right;    Current Medications: Current Meds  Medication Sig  . acetaminophen (TYLENOL 8 HOUR) 650 MG CR tablet Take 650 mg by mouth every 8 (eight) hours as needed for pain.  . ARIPiprazole (ABILIFY) 5 MG tablet Take 1 tablet (5 mg total) by mouth daily.  . Calcium-Vitamin D (CALTRATE 600 PLUS-VIT D PO) Take 1 tablet by mouth daily.   . Cholecalciferol (PA VITAMIN D-3 GUMMY PO) Take 2 tablets by mouth 2 (two) times daily.  . febuxostat (ULORIC) 40 MG tablet TAKE 1 TABLET(40 MG) BY MOUTH DAILY  . gabapentin (NEURONTIN) 100 MG capsule Take 100 mg by mouth at bedtime.  . Iron Combinations (CHROMAGEN) capsule Take 1 capsule by mouth daily.  Marland Kitchen L-Methylfolate-Algae (L-METHYLFOLATE FORTE) 15-90.314 MG CAPS Take 1 capsule by mouth daily.  . mometasone (ELOCON) 0.1 % cream   . torsemide (DEMADEX) 20 MG tablet Take 20 mg by mouth daily.  Marland Kitchen tranylcypromine (PARNATE) 10 MG tablet Take 30 mg by mouth 2 (two) times daily.     Allergies:   Patient has no known allergies.   Social History   Socioeconomic History  . Marital status: Divorced    Spouse name: Not on file  . Number of children: Not on file  . Years of education: Not on file  . Highest education level: Not on file  Occupational History  . Not on file  Social Needs  . Financial resource  strain: Not on file  . Food insecurity:    Worry: Not on file    Inability: Not on file  . Transportation needs:    Medical: Not on file    Non-medical: Not on file  Tobacco Use  . Smoking status: Never Smoker  . Smokeless tobacco: Never Used  Substance and Sexual Activity  . Alcohol use: No    Alcohol/week: 0.0 standard drinks  . Drug use: No  . Sexual activity: Yes  Lifestyle  . Physical activity:    Days per week: Not on file    Minutes per session: Not on file  . Stress: Not on file  Relationships  . Social connections:    Talks on phone: Not  on file    Gets together: Not on file    Attends religious service: Not on file    Active member of club or organization: Not on file    Attends meetings of clubs or organizations: Not on file    Relationship status: Not on file  Other Topics Concern  . Not on file  Social History Narrative   GF Luther Redo. 2 sons, 1 daughter from prior relationship      Retired from Air traffic controller business in [place]: movies, time around the house, out for dinner     Family History:  The patient's family history includes Appendicitis in his paternal grandfather; Cancer in his maternal grandmother and mother; Prostate cancer in his brother; Stroke in his father.   ROS:   Please see the history of present illness.    ROS All other systems reviewed and are negative.  No flowsheet data found.     PHYSICAL EXAM:   VS:  BP 120/66   Pulse (!) 107   Ht 5\' 4"  (1.626 m)   Wt 191 lb (86.6 kg)   SpO2 97%   BMI 32.79 kg/m    GEN: Well nourished, well developed, in no acute distress  HEENT: normal  Neck: no JVD, carotid bruits, or masses Cardiac: RRR; no murmurs, rubs, or gallops,no edema.  Intact distal pulses bilaterally.  Respiratory:  clear to auscultation bilaterally, normal work of breathing GI: soft, nontender, nondistended, + BS MS: no deformity or atrophy  Skin: warm and dry, no rash Neuro:  Alert and Oriented x 3,  Strength and sensation are intact Psych: euthymic mood, full affect  Wt Readings from Last 3 Encounters:  01/18/18 188 lb 6.4 oz (85.5 kg)  01/13/18 191 lb (86.6 kg)  12/13/17 195 lb 12.8 oz (88.8 kg)      Studies/Labs Reviewed:   EKG:  EKG is ordered today and showed sinus tachycardia 107 bpm with inferolateral ST-T wave abnormality.  Recent Labs: 11/29/2017: ALT 8; Pro B Natriuretic peptide (BNP) 11.0; TSH 3.57 12/08/2017: Hemoglobin 12.3; Platelets 290.0 01/13/2018: BUN 58; Creatinine, Ser 2.82; Potassium 4.0; Sodium 140   Lipid Panel    Component Value Date/Time   CHOL 154 06/24/2017 1002   TRIG 68.0 06/24/2017 1002   TRIG 58 03/23/2006 1139   HDL 66.00 06/24/2017 1002   CHOLHDL 2 06/24/2017 1002   VLDL 13.6 06/24/2017 1002   LDLCALC 74 06/24/2017 1002    Additional studies/ records that were reviewed today include:  Office notes from PCP    ASSESSMENT:    1. Chronic diastolic CHF (congestive heart failure) ([place])   2. Chronic venous insufficiency   3. Stage 3 chronic kidney disease (HCC)   4. Paroxysmal tachycardia ([place])   5. Tachycardia      PLAN:  In order of problems listed above:  1.  Chronic venous insufficiency - LE venous dopplers were negatirve in [place].  Right heart cath was completely normal with normal right and left sided filling pressures.  Echo with normal LVF.  I agree with Cardiologist in [place] that this is likely not cardiac related.  He hangs his legs down when sitting and has CKD dz which is likely playing a role as well exacerbating his LE venous insufficiency.  His albumin is back to normal. He has minimal LE edema on exam today.  Recommend continuing torsemide which should continue to be managed by renal.    2.  Chronic kidney disease stage  III -this is managed by [employer].  I will check a BMET today since he is still on Torsemide 20mg  daily.    3.  Ascending aortic aneurysm - this was 38 mm by echo in 2014.  This was  not noted on echo 08/2017.  4.  Abnormal EKG -EKG demonstrates inferolateral ST abnormality. He had a stress echo in [place] 10/07/2017 that I will try to get the results of.   5.  Tachycardia - ? Etiology. Hbg 12.3 on 11/2017.   I will get a 24 hour Holter to assess average HR.  TSH normal 11/29/2017.     Medication Adjustments/Labs and Tests Ordered: Current medicines are reviewed at length with the patient today.  Concerns regarding medicines are outlined above.  Medication changes, Labs and Tests ordered today are listed in the Patient Instructions below.  Patient Instructions  Medication Instructions:  Your physician recommends that you continue on your current medications as directed. Please refer to the Current Medication list given to you today.  If you need a refill on your cardiac medications before your next appointment, please call your pharmacy.   Lab work: Today: BMET  If you have labs (blood work) drawn today and your tests are completely normal, you will receive your results only by: Marland Kitchen MyChart Message (if you have MyChart) OR . A paper copy in the mail If you have any lab test that is abnormal or we need to change your treatment, we will call you to review the results.  Testing/Procedures: Your physician has recommended that you wear a 24 Hr holter monitor. Holter monitors are medical devices that record the heart's electrical activity. Doctors most often use these monitors to diagnose arrhythmias. Arrhythmias are problems with the speed or rhythm of the heartbeat. The monitor is a small, portable device. You can wear one while you do your normal daily activities. This is usually used to diagnose what is causing palpitations/syncope (passing out).   Follow-Up: At [facility], you and your health needs are our priority.  As part of our continuing mission to provide you with exceptional heart care, we have created designated Provider Care Teams.  These Care Teams  include your primary Cardiologist (physician) and Advanced Practice Providers (APPs -  Physician Assistants and Nurse Practitioners) who all work together to provide you with the care you need, when you need it. You will need a follow up appointment in 1 years.  Please call our office 2 months in advance to schedule this appointment.  You may see Dr. Radford Pax or one of the following Advanced Practice Providers on your designated Care Team:   [place], PA-C Melina Copa, PA-C . Ermalinda Barrios, PA-C       Signed, Fransico Him, MD  01/18/2018 10:59 PM    [facility] [place], [place],   [address] Phone: [phone]; Fax: [phone]

## 2018-01-13 NOTE — Telephone Encounter (Signed)
Sent OV and Labs to fax number.

## 2018-01-13 NOTE — Addendum Note (Signed)
Addended by: Sarina Ill on: 01/13/2018 11:12 AM   Modules accepted: Orders

## 2018-01-18 ENCOUNTER — Encounter: Payer: Self-pay | Admitting: Family Medicine

## 2018-01-18 ENCOUNTER — Ambulatory Visit (INDEPENDENT_AMBULATORY_CARE_PROVIDER_SITE_OTHER): Payer: Medicare Other | Admitting: Family Medicine

## 2018-01-18 ENCOUNTER — Encounter: Payer: Self-pay | Admitting: Cardiology

## 2018-01-18 ENCOUNTER — Ambulatory Visit (INDEPENDENT_AMBULATORY_CARE_PROVIDER_SITE_OTHER): Payer: Medicare Other

## 2018-01-18 VITALS — BP 108/72 | HR 90 | Temp 97.8°F | Ht 64.0 in | Wt 188.4 lb

## 2018-01-18 DIAGNOSIS — M25552 Pain in left hip: Secondary | ICD-10-CM

## 2018-01-18 DIAGNOSIS — S79912A Unspecified injury of left hip, initial encounter: Secondary | ICD-10-CM | POA: Diagnosis not present

## 2018-01-18 DIAGNOSIS — I5032 Chronic diastolic (congestive) heart failure: Secondary | ICD-10-CM

## 2018-01-18 DIAGNOSIS — N184 Chronic kidney disease, stage 4 (severe): Secondary | ICD-10-CM | POA: Diagnosis not present

## 2018-01-18 DIAGNOSIS — F3131 Bipolar disorder, current episode depressed, mild: Secondary | ICD-10-CM | POA: Diagnosis not present

## 2018-01-18 NOTE — Assessment & Plan Note (Signed)
S: phq9 reasonably controlled-medications through Dr. Casimiro Needle on Abilify, Parnate, Methylfolate forte A/P: stable. Appears each of these can be used with renal disease- caution noted needed with parnate.

## 2018-01-18 NOTE — Patient Instructions (Addendum)
Team- please update phq9  Please call [facility]- we need to get you in for follow up to monitor your diuretic dose and kidney function.   Lets try 200mg  of gabapentin before bed. Go back to 100mg  if continued daytime fatigue.   Sit in x-ray area - Our x-ray tech Should be back by 1 PM

## 2018-01-18 NOTE — Assessment & Plan Note (Signed)
S: Patient is compliant with torsemide.  Saw cardiology who recommended primarily renal adjust his diuretics given CKD stage IV.  Patient's weight is down 7 pounds since last visit here in September.  He states his swelling has improved. A/P: Volume status appears to be improved-continue torsemide.  Recently had bmet.

## 2018-01-18 NOTE — Progress Notes (Signed)
Subjective:  [name] is a 77 y.o. year old very pleasant male patient who presents for/with See problem oriented charting ROS-low back pain noted, left hip and leg pain noted.  Shortness of breath improved.  No chest pain reported.  Past Medical History-  Patient Active Problem List   Diagnosis Date Noted  . Chronic diastolic CHF (congestive heart failure) ([place]) 11/30/2017    Priority: High  . Mild depressed bipolar I disorder ([place]) 09/21/2006    Priority: High  . Chronic low back pain 03/08/2017    Priority: Medium  . Gout 07/07/2016    Priority: Medium  . Fatty liver 10/09/2014    Priority: Medium  . OSA (obstructive sleep apnea) 10/30/2008    Priority: Medium  . Osteoporosis 09/25/2008    Priority: Medium  . CKD (chronic kidney disease), stage IV ([place]) 03/01/2008    Priority: Medium  . Senile purpura ([place]) 06/22/2017    Priority: Low  . Hypersomnolence 08/20/2016    Priority: Low  . Other constipation 01/13/2016    Priority: Low  . Primary osteoarthritis of right knee 12/08/2015    Priority: Low  . Gallstones 10/09/2014    Priority: Low  . Bilateral lower extremity edema 09/25/2014    Priority: Low  . Chronic venous insufficiency 08/29/2013    Priority: Low  . Right shoulder pain 08/29/2013    Priority: Low  . Edema 01/13/2018  . Closed left hip fracture, initial encounter ([place]) 11/30/2017  . Shortness of breath 11/11/2017  . Bilateral leg edema 11/11/2017  . Bipolar 1 disorder ([place]) 10/15/2017  . Hyperuricemia 10/15/2017  . Abnormal LFTs 09/04/2017  . Acute respiratory failure with hypoxia ([place]) 09/04/2017  . Anasarca 09/04/2017  . Bilateral atelectasis 09/04/2017  . Fever 09/04/2017  . Hypoventilation associated with obesity ([place]) 09/04/2017  . Ileus, postoperative ([place]) 09/04/2017  . Leukocytosis 09/04/2017  . Closed intertrochanteric fracture of left hip ([place]) 09/01/2017  . CKD (chronic kidney disease) 09/01/2017  . Hip fracture ([place]) 09/01/2017   . Osteoarthritis of spine 03/21/2017  . Neuropathy 01/20/2011    Medications- reviewed and updated Current Outpatient Medications  Medication Sig Dispense Refill  . acetaminophen (TYLENOL 8 HOUR) 650 MG CR tablet Take 650 mg by mouth every 8 (eight) hours as needed for pain.    . ARIPiprazole (ABILIFY) 5 MG tablet Take 1 tablet (5 mg total) by mouth daily. 90 tablet 3  . Calcium-Vitamin D (CALTRATE 600 PLUS-VIT D PO) Take 1 tablet by mouth daily.     . Cholecalciferol (PA VITAMIN D-3 GUMMY PO) Take 2 tablets by mouth 2 (two) times daily.    . febuxostat (ULORIC) 40 MG tablet TAKE 1 TABLET(40 MG) BY MOUTH DAILY 30 tablet 1  . Iron Combinations (CHROMAGEN) capsule Take 1 capsule by mouth daily.    Marland Kitchen L-Methylfolate-Algae (L-METHYLFOLATE FORTE) 15-90.314 MG CAPS Take 1 capsule by mouth daily.  0  . mometasone (ELOCON) 0.1 % cream     . torsemide (DEMADEX) 20 MG tablet Take 20 mg by mouth daily.    Marland Kitchen tranylcypromine (PARNATE) 10 MG tablet Take 30 mg by mouth 2 (two) times daily.     Marland Kitchen gabapentin (NEURONTIN) 100 MG capsule Take 100 mg by mouth at bedtime.     No current facility-administered medications for this visit.     Objective: BP 108/72 (BP Location: Left Arm, Patient Position: Sitting, Cuff Size: Large)   Pulse 90   Temp 97.8 F (36.6 C) (Oral)   Ht 5\' 4"  (1.626  m)   Wt 188 lb 6.4 oz (85.5 kg)   SpO2 95%   BMI 32.34 kg/m  Gen: NAD, resting comfortably CV: RRR no murmurs rubs or gallops Lungs: CTAB no crackles, wheeze, rhonchi Abdomen: soft/nontender/nondistended/normal bowel sounds. No rebound or guarding.  Ext: 1+ edema (improved) Skin: warm, dry Was asleep when I entered the room- seems fatigued. Walking with walker. Antalgic gait.   Assessment/Plan:  Other notes: 1. patient with upcoming back surgery. Pain in left leg severe. Pain worse off gabapentin but felt too sleepy on medicine 100mg  TID- didn't feel comfortable driving due to fatigue on this. He is on just  100mg  at night. We discussed trialing back at 200mg  at night (with low GFR hoping will stay in system but not make him too sleepy). Should follow up with his surgeon if not improving. He requested x-ray of left hip today- which was updated- awaiting radiology overread  Mild depressed bipolar I disorder ([place]) S: phq9 reasonably controlled-medications through Dr. Casimiro Needle on Abilify, Parnate, Methylfolate forte A/P: stable. Appears each of these can be used with renal disease- caution noted needed with parnate.   Chronic diastolic CHF (congestive heart failure) ([place]) S: Patient is compliant with torsemide.  Saw cardiology who recommended primarily renal adjust his diuretics given CKD stage IV.  Patient's weight is down 7 pounds since last visit here in September.  He states his swelling has improved. A/P: Volume status appears to be improved-continue torsemide.  Recently had bmet.  CKD (chronic kidney disease), stage IV (HCC) S: Unfortunately it appears patient has progressed to chronic kidney disease stage IV.  Last GFR was in low 20s.  He knows to avoid Aleve and Motrin.  He remains on torsemide 20 mg daily-renal has been okay with this dosing and slight worsening of GFR as has been required to keep him adequately diuresed A/P: Stable- cardiology recommended renal follow-up and I also encouraged patient to do the same today.  He will be establishing with a new provider as Dr. Florene Glen is no longer at [facility].   Future Appointments  Date Time Provider [place]  01/20/2018 12:00 PM [facility]  02/15/2018 10:00 AM [facility] PAT 1 [facility]-SDSC None  03/20/2018 10:45 AM Yong Channel Brayton Mars, MD [facility]   2 month follow up advised.   Lab/Order associations: Left hip pain - Plan: DG HIP UNILAT W OR W/O PELVIS 2-3 VIEWS LEFT  Mild depressed bipolar I disorder (HCC)  Chronic diastolic CHF (congestive heart failure) (HCC)  CKD (chronic kidney disease),  stage IV ([place])  Return precautions advised.  Garret Reddish, MD

## 2018-01-18 NOTE — Assessment & Plan Note (Signed)
S: Unfortunately it appears patient has progressed to chronic kidney disease stage IV.  Last GFR was in low 20s.  He knows to avoid Aleve and Motrin.  He remains on torsemide 20 mg daily-renal has been okay with this dosing and slight worsening of GFR as has been required to keep him adequately diuresed A/P: Stable- cardiology recommended renal follow-up and I also encouraged patient to do the same today.  He will be establishing with a new provider as Dr. Florene Glen is no longer at [facility].

## 2018-01-20 ENCOUNTER — Ambulatory Visit (INDEPENDENT_AMBULATORY_CARE_PROVIDER_SITE_OTHER): Payer: Medicare Other

## 2018-01-20 DIAGNOSIS — R000 Tachycardia, unspecified: Secondary | ICD-10-CM

## 2018-01-23 ENCOUNTER — Telehealth: Payer: Self-pay | Admitting: Cardiology

## 2018-01-23 DIAGNOSIS — R609 Edema, unspecified: Secondary | ICD-10-CM | POA: Diagnosis not present

## 2018-01-23 DIAGNOSIS — N189 Chronic kidney disease, unspecified: Secondary | ICD-10-CM | POA: Diagnosis not present

## 2018-01-23 DIAGNOSIS — N183 Chronic kidney disease, stage 3 (moderate): Secondary | ICD-10-CM | POA: Diagnosis not present

## 2018-01-23 DIAGNOSIS — N2581 Secondary hyperparathyroidism of renal origin: Secondary | ICD-10-CM | POA: Diagnosis not present

## 2018-01-23 DIAGNOSIS — N184 Chronic kidney disease, stage 4 (severe): Secondary | ICD-10-CM | POA: Diagnosis not present

## 2018-01-23 DIAGNOSIS — D631 Anemia in chronic kidney disease: Secondary | ICD-10-CM | POA: Diagnosis not present

## 2018-01-23 LAB — CBC AND DIFFERENTIAL
HEMATOCRIT: 35 — AB (ref 41–53)
Hemoglobin: 11.5 — AB (ref 13.5–17.5)
PLATELETS: 224 (ref 150–399)
WBC: 6.9

## 2018-01-23 LAB — BASIC METABOLIC PANEL
BUN: 46 — AB (ref 4–21)
Creatinine: 2.4 — AB (ref 0.6–1.3)
GLUCOSE: 121
Potassium: 4.6 (ref 3.4–5.3)
SODIUM: 138 (ref 137–147)

## 2018-01-23 LAB — VITAMIN D 25 HYDROXY (VIT D DEFICIENCY, FRACTURES): Vit D, 25-Hydroxy: 83

## 2018-01-23 LAB — IRON,TIBC AND FERRITIN PANEL: FERRITIN: 163

## 2018-01-23 NOTE — Telephone Encounter (Signed)
Error

## 2018-01-23 NOTE — Telephone Encounter (Signed)
PATIENT WALK-IN :  Patient came in to drop off monitor. Patient stated that Dr. Radford Pax wanted to know the doctor who ordered Echo for patient.   The information is :  Dr. Ovid Curd. Phone # [phone]

## 2018-01-24 DIAGNOSIS — G4733 Obstructive sleep apnea (adult) (pediatric): Secondary | ICD-10-CM | POA: Diagnosis not present

## 2018-01-26 ENCOUNTER — Encounter: Payer: Self-pay | Admitting: Cardiology

## 2018-01-27 ENCOUNTER — Encounter: Payer: Self-pay | Admitting: Family Medicine

## 2018-01-28 DIAGNOSIS — G4733 Obstructive sleep apnea (adult) (pediatric): Secondary | ICD-10-CM | POA: Diagnosis not present

## 2018-01-31 ENCOUNTER — Other Ambulatory Visit: Payer: Self-pay | Admitting: Family Medicine

## 2018-02-01 NOTE — Telephone Encounter (Signed)
You are correct- why is he requesting higher dose?

## 2018-02-01 NOTE — Telephone Encounter (Signed)
Called patient and left a voicemail message asking patient to call our office and clarify his dosage

## 2018-02-01 NOTE — Telephone Encounter (Signed)
Spoke to patient who states he is taking 2 a night at bedtime due to the medication making him sleepy. Please advise on dosage

## 2018-02-01 NOTE — Telephone Encounter (Signed)
Per OV note 01/13/18 from Cardiology:  . gabapentin (NEURONTIN) 100 MG capsule Take 100 mg by mouth at bedtime.   This looks like a dosage change from what we had previously. Please advise

## 2018-02-01 NOTE — Telephone Encounter (Signed)
May send in gabapentin 100 mg- take 2 at bedtime #180 with 1 refill

## 2018-02-08 ENCOUNTER — Other Ambulatory Visit: Payer: Self-pay | Admitting: Neurosurgery

## 2018-02-14 NOTE — Pre-Procedure Instructions (Signed)
Diana Armijo  02/14/2018        Your procedure is scheduled on 02/23/2018.  Report to [facility] Admitting at [phone] A.M.  Call this number if you have problems the morning of surgery:  [phone]   Remember:  Do not eat or drink after midnight.    Take these medicines the morning of surgery with A SIP OF WATER: Acetaminophen (Tylenol) Aripiprazole (Abilify) Febuxostat (Uloric) Tranylcypromine (Parnate)  7 days prior to surgery STOP taking any Aspirin (unless otherwise instructed by your surgeon), Aleve, Naproxen, Ibuprofen, Motrin, Advil, Goody's, BC's, all herbal medications, fish oil, and all vitamins      Do not wear jewelry.  Do not wear lotions, powders, or colognes, or deodorant.  Men may shave face and neck.  Do not bring valuables to the hospital.  [facility] is not responsible for any belongings or valuables.  Contacts, eyeglasses, hearing aids, dentures or bridgework may not be worn into surgery.  Leave your suitcase in the car.  After surgery it may be brought to your room.  For patients admitted to the hospital, discharge time will be determined by your treatment team.  Patients discharged the day of surgery will not be allowed to drive home.   Name and phone number of your driver:    Special instructions:   [place]- Preparing For Surgery  Before surgery, you can play an important role. Because skin is not sterile, your skin needs to be as free of germs as possible. You can reduce the number of germs on your skin by washing with CHG (chlorahexidine gluconate) Soap before surgery.  CHG is an antiseptic cleaner which kills germs and bonds with the skin to continue killing germs even after washing.    Oral Hygiene is also important to reduce your risk of infection.  Remember - BRUSH YOUR TEETH THE MORNING OF SURGERY WITH YOUR REGULAR TOOTHPASTE  Please do not use if you have an allergy to CHG or antibacterial soaps. If your skin becomes  reddened/irritated stop using the CHG.  Do not shave (including legs and underarms) for at least 48 hours prior to first CHG shower. It is OK to shave your face.  Please follow these instructions carefully.   1. Shower the NIGHT BEFORE SURGERY and the MORNING OF SURGERY with CHG.   2. If you chose to wash your hair, wash your hair first as usual with your normal shampoo.  3. After you shampoo, rinse your hair and body thoroughly to remove the shampoo.  4. Use CHG as you would any other liquid soap. You can apply CHG directly to the skin and wash gently with a scrungie or a clean washcloth.   5. Apply the CHG Soap to your body ONLY FROM THE NECK DOWN.  Do not use on open wounds or open sores. Avoid contact with your eyes, ears, mouth and genitals (private parts). Wash Face and genitals (private parts)  with your normal soap.  6. Wash thoroughly, paying special attention to the area where your surgery will be performed.  7. Thoroughly rinse your body with warm water from the neck down.  8. DO NOT shower/wash with your normal soap after using and rinsing off the CHG Soap.  9. Pat yourself dry with a CLEAN TOWEL.  10. Wear CLEAN PAJAMAS to bed the night before surgery, wear comfortable clothes the morning of surgery  11. Place CLEAN SHEETS on your bed the night of your first shower and DO  NOT SLEEP WITH PETS.    Day of Surgery: Shower as stated above. Do not apply any deodorants/lotions.  Please wear clean clothes to the hospital/surgery center.   Remember to brush your teeth WITH YOUR REGULAR TOOTHPASTE.    Please read over the following fact sheets that you were given.

## 2018-02-15 ENCOUNTER — Encounter (HOSPITAL_COMMUNITY)
Admission: RE | Admit: 2018-02-15 | Discharge: 2018-02-15 | Disposition: A | Discharge status: Home / Self Care | Payer: Medicare Other | Source: Ambulatory Visit | Attending: Neurosurgery | Admitting: Neurosurgery

## 2018-02-15 ENCOUNTER — Encounter (HOSPITAL_COMMUNITY): Payer: Self-pay

## 2018-02-15 ENCOUNTER — Other Ambulatory Visit: Payer: Self-pay

## 2018-02-15 DIAGNOSIS — Z01812 Encounter for preprocedural laboratory examination: Secondary | ICD-10-CM | POA: Insufficient documentation

## 2018-02-15 LAB — TYPE AND SCREEN
ABO/RH(D): A POS
ANTIBODY SCREEN: NEGATIVE

## 2018-02-15 LAB — BASIC METABOLIC PANEL
ANION GAP: 6 (ref 5–15)
BUN: 43 mg/dL — ABNORMAL HIGH (ref 8–23)
CO2: 20 mmol/L — AB (ref 22–32)
CREATININE: 2.24 mg/dL — AB (ref 0.61–1.24)
Calcium: 9.3 mg/dL (ref 8.9–10.3)
Chloride: 111 mmol/L (ref 98–111)
GFR calc non Af Amer: 27 mL/min — ABNORMAL LOW (ref >60)
GFR, EST AFRICAN AMERICAN: 31 mL/min — AB (ref >60)
Glucose, Bld: 127 mg/dL — ABNORMAL HIGH (ref 70–99)
POTASSIUM: 4.4 mmol/L (ref 3.5–5.1)
SODIUM: 137 mmol/L (ref 135–145)

## 2018-02-15 LAB — CBC
HCT: 36.4 % — ABNORMAL LOW (ref 39.0–52.0)
HEMOGLOBIN: 10.9 g/dL — AB (ref 13.0–17.0)
MCH: 29.5 pg (ref 26.0–34.0)
MCHC: 29.9 g/dL — ABNORMAL LOW (ref 30.0–36.0)
MCV: 98.4 fL (ref 80.0–100.0)
NRBC: 0 % (ref 0.0–0.2)
Platelets: 219 10*3/uL (ref 150–400)
RBC: 3.7 MIL/uL — AB (ref 4.22–5.81)
RDW: 12.7 % (ref 11.5–15.5)
WBC: 7.7 10*3/uL (ref 4.0–10.5)

## 2018-02-15 LAB — SURGICAL PCR SCREEN
MRSA, PCR: NEGATIVE
Staphylococcus aureus: NEGATIVE

## 2018-02-15 NOTE — Progress Notes (Signed)
PCP - Garret Reddish Cardiologist - Fransico Him Nephrologist- [facility]- notes in media tab  Chest x-ray - 11/29/17 EKG - 01/13/18 Stress Test - requested from [facility] ECHO - 09/14/12 Cardiac Cath - N/A  Sleep Study - 11/29/08 epic CPAP - wears CPAP, does not know settings, pt to bring mask DOS  Anesthesia review: heart history, follow up requested stress test.   Patient denies shortness of breath, fever, cough and chest pain at PAT appointment   Patient verbalized understanding of instructions that were given to them at the PAT appointment. Patient was also instructed that they will need to review over the PAT instructions again at home before surgery.

## 2018-02-17 ENCOUNTER — Telehealth: Payer: Self-pay | Admitting: Family Medicine

## 2018-02-17 NOTE — Progress Notes (Signed)
Anesthesia Chart Review:   Case:  [id number] Date/Time:  02/23/18 1300   Procedure:  POSTERIOR LUMBAR INTERBODY FUSION LUMBAR 4- LUMBAR 5, BIOMECHANICAL DEVICE, INTERBODY ARTHRODESIS, POSTERIOR NON-SEGMENTAL INSTRUMENTATION (N/A ) - POSTERIOR LUMBAR INTERBODY FUSION LUMBAR 4- LUMBAR 5, BIOMECHANICAL DEVICE, INTERBODY ARTHRODESIS, POSTERIOR NON-SEGMENTAL INSTRUMENTATION   Anesthesia type:  General   Pre-op diagnosis:  SPONDYLOLISTHESIS, LUMBAR REGION   Location:  [facility] OR ROOM 44 / [facility] OR   Surgeon:  Consuella Lose, MD      DISCUSSION:  - Pt is a 77 year old male with hx chronic diastolic HF (thought to be inaccurate dx given normal R heart cath per cardiologist Fransico Him, MD who suspects chronic venous insufficiency instead), CKD (now stage 4), OSA, bipolar 1 disorder  - Medications include tranylcypromine (Parnate), a MAOI.   - He was visiting his daughter in [place] in June and fell and fractured his hip.  He underwent surgical repair which was complicated by respiratory failure requiring BiPAP as well as acute on CKD stage 3 and post op ileus  (see notes in care everywhere)   - Pt had abnormal EKG showing inferolateral ST abnormality at Dr. Theodosia Blender office.  Dr. Radford Pax comments "He had a stress echo in [place] 10/07/2017 that I will try to get the results of."   After researching in care everywhere, I do not believe pt had stress echo.  However, abnormal EKG is concerning.  Discussed with Dr. Linna Caprice.  We would like Dr. Radford Pax to weigh in on pt's fitness for proceeding with surgery as scheduled.      VS: BP 138/67   Pulse 92   Temp 36.6 C   Resp 20   Ht 5\' 1"  (1.549 m)   Wt 86.5 kg   SpO2 98%   BMI 36.05 kg/m   PROVIDERS: - PCP is [name], MD. Last office visit 01/18/18 - Cardiologist is Fransico Him, MD. Last office visit 01/13/18; 1 year f/u recommended - Nephrologist is Elmarie Shiley, MD. Last office visit 01/23/18 (notes in media tab)   LABS:  - Cr 2.24,  BUN 43. This is consistent with prior findings.   (all labs ordered are listed, but only abnormal results are displayed)  Labs Reviewed  BASIC METABOLIC PANEL - Abnormal; Notable for the following components:      Result Value   CO2 20 (*)    Glucose, Bld 127 (*)    BUN 43 (*)    Creatinine, Ser 2.24 (*)    GFR calc non Af Amer 27 (*)    GFR calc Af Amer 31 (*)    All other components within normal limits  CBC - Abnormal; Notable for the following components:   RBC 3.70 (*)    Hemoglobin 10.9 (*)    HCT 36.4 (*)    MCHC 29.9 (*)    All other components within normal limits  SURGICAL PCR SCREEN  TYPE AND SCREEN     IMAGES:  CXR 10/05/17 (care everywhere): No evidence of acute cardiopulmonary process.   EKG 01/13/18: sinus tachycardia (107 bpm). ST & T wave abnormality, consider inferior ischemia)   CV:  Holter monitor 01/20/18:   Sinus bradycardia, normal sinus rhythm and sinus tachycardia. The average heart rate was 89 bpm. The heart rate ranged from 50 to 119 bpm.  Occasional PACs and atrial couplets  R heart cath 11/15/17 (care everywhere):  1.Normal central venous pressure (7 mmHg). 2.Normal pulmonary artery pressures (36/12 mmHg, mean 22 mmHg). 3.Normal left sided  filling pressures (PCWP 10 mmHg). 4.Normal cardiac output and cardiac index (CO 5.8 L/min, CI 3.2  L/min/m2).  Echo 10/13/17 (care everywhere):   Limited echo to evaluate lower extremity edema.  Normal-sized left ventricular cavity. Normal left ventricular wall thickness. Preserved systolic function. Estimated EF = 70%.  Normal-sized right ventricular cavity with preserved systolic function.  Normal-sized left atrium.  Mild aortic valve sclerosis  Mild mitral valve sclerotic changes  Trace tricuspid valve regurgitation with RVSP of 36 mmHg.  Normal-sized right atrium.  IVC is <2.1cm.  Trace to small nearly circumferential pericardial effusion with no evidence of  tamponade.    OTHER TESTING:   PFTs 10/26/17 (care everywhere):  - Impression: Overall, the data points to his broncho-reactivity and mild  restrictive lung disease.   Past Medical History:  Diagnosis Date  . Arthritis    back  . Back pain    reason unknown  . Benign prostatic hyperplasia (BPH) with urinary urge incontinence    sees Dr. Jeffie Pollock   . Bipolar 1 disorder [facility])    sees Dr. Norma Fredrickson  . Chronic diastolic CHF (congestive heart failure) ([place]) 11/30/2017   [facility] 10/2017 showed normal filling pressures. 2D echo with EF 55-60%, grade 1 DD, MIld RVE with normal RVF (normal RV size with followup echo 09/09/2017), trivial MR/PR, mild TR  . CKD (chronic kidney disease)    stage 3 followed by [facility]  . Depression    takes Parnate daily  . Diverticulitis of colon 05/27/2015  . Essential hypertension 04/11/2007  . Headache(784.0)    occasionally  . Hip fracture ([place]) 09/01/2017   left hip fracture  . History of colon polyps   . Hypothyroidism 10/03/2009   Noted by Dr. Arnoldo Morale- patient states never on medicine- may have been checked due to parnate and abilify   . 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 Joint pain   . Joint swelling   . Osteoporosis    takes Drisdol weekly and Caltrate daily  . Peripheral edema    takes torsemide daily followed by nephrology  . Shingles 05/27/2015  . Sleep apnea    wears c-pap    Past Surgical History:  Procedure Laterality Date  . CARDIAC CATHETERIZATION    . COLONOSCOPY W/ POLYPECTOMY  03-10-15   per Dr. Havery Moros, adenomatous polyps, repeat in 3 yrs   . HIP FRACTURE SURGERY Left 09/02/2017  . REVERSE SHOULDER ARTHROPLASTY Right 05/22/2013   Procedure: REVERSE SHOULDER ARTHROPLASTY;  Surgeon: Nita Sells, MD;  Location: [place];  Service: Orthopedics;  Laterality: Right;  Right reverse total shoulder  . TONSILLECTOMY    . TOTAL KNEE  ARTHROPLASTY Right 12/08/2015   Procedure: TOTAL KNEE ARTHROPLASTY;  Surgeon: Dorna Leitz, MD;  Location: [place];  Service: Orthopedics;  Laterality: Right;    MEDICATIONS: . acetaminophen (TYLENOL 8 HOUR) 650 MG CR tablet  . ARIPiprazole (ABILIFY) 5 MG tablet  . Calcium-Vitamin D (CALTRATE 600 PLUS-VIT D PO)  . febuxostat (ULORIC) 40 MG tablet  . gabapentin (NEURONTIN) 100 MG capsule  . L-Methylfolate-Algae (L-METHYLFOLATE FORTE) 15-90.314 MG CAPS  . tranylcypromine (PARNATE) 10 MG tablet   No current facility-administered medications for this encounter.     Will leave chart for f/u.   Willeen Cass, FNP-BC [facility]/Anesthesiology Phone: [phone] 02/17/2018 3:30 PM

## 2018-02-17 NOTE — Telephone Encounter (Signed)
Copied from [place] [id number]. Topic: Quick Communication - See Telephone Encounter >> Feb 17, 2018  3:24 PM Blase Mess A wrote: CRM for notification. See Telephone encounter for: 02/17/18.  Patient is calling to speak to [facility] to schedule appt for  CPAP for homecare request.  The soonest appt is in Jan 2020. The patient is going in for surgery Friday February 23, 2018 for a back operation.  Please advise Thank you.

## 2018-02-20 ENCOUNTER — Telehealth: Payer: Self-pay | Admitting: *Deleted

## 2018-02-20 DIAGNOSIS — R9431 Abnormal electrocardiogram [ECG] [EKG]: Secondary | ICD-10-CM

## 2018-02-20 NOTE — Anesthesia Preprocedure Evaluation (Addendum)
Anesthesia Evaluation  Patient identified by MRN, date of birth, ID band Patient awake    Reviewed: Allergy & Precautions, NPO status , Patient's Chart, lab work & pertinent test results  Airway Mallampati: II  TM Distance: >3 FB     Dental   Pulmonary sleep apnea ,    breath sounds clear to auscultation       Cardiovascular hypertension, + Peripheral Vascular Disease and +CHF   Rhythm:Regular Rate:Normal     Neuro/Psych  Headaches,    GI/Hepatic negative GI ROS, Neg liver ROS,   Endo/Other  Hypothyroidism   Renal/GU Renal disease     Musculoskeletal  (+) Arthritis ,   Abdominal   Peds  Hematology   Anesthesia Other Findings   Reproductive/Obstetrics                            Anesthesia Physical Anesthesia Plan  ASA: III  Anesthesia Plan: General   Post-op Pain Management:    Induction: Intravenous  PONV Risk Score and Plan: Ondansetron, Treatment may vary due to age or medical condition, Dexamethasone and Midazolam  Airway Management Planned: Oral ETT  Additional Equipment:   Intra-op Plan:   Post-operative Plan: Possible Post-op intubation/ventilation  Informed Consent:   Dental advisory given  Plan Discussed with: CRNA and Anesthesiologist  Anesthesia Plan Comments: (See PAT note by Willeen Cass, NP with update by Myra Gianotti, PA-C. Patient is on a MAO inhibitor. )      Anesthesia Quick Evaluation

## 2018-02-20 NOTE — Telephone Encounter (Signed)
   [facility] Pre-operative Risk Assessment    Request for surgical clearance:  1. What type of surgery is being performed? POSTERIOR LUMBAR FUSION   2. When is this surgery scheduled? 02/23/18   3. Are there any medications that need to be held prior to surgery and how long?  PT NOT ON ANY ANTICOAGS  4. Practice name and name of physician performing surgery?  [facility]; DR. Marita [place] NUNDKUMAR   5. What is your office phone and fax number? PH# [phone] EXT [phone]; FAX# [phone]   9. Anesthesia type (None, local, MAC, general) ? GENERAL   Julaine Hua 02/20/2018, 12:27 PM  _________________________________________________________________   (provider comments below)

## 2018-02-20 NOTE — Progress Notes (Signed)
ANESTHESIA FOLLOW-UP:  I spoke with Lexine Baton at Dr. Cleotilde Neer office regarding patient's need for cardiology preoperative input given Dr. Radford Pax mentioned getting copies of patient's recent stress echo (in response to abnormal EKG).  In review of cardiology notes and discharge summary from [facility]), they only mention echo reports (not stress echo). Cardiology office and admission notes from 10/2017 [facility]) also do not mention prior stress test or stress echo. Dr. Theodosia Blender note does not specify if other testing would be recommended if she were unable to verify that he had a recent stress echo.   If review of records in [place], the following were outlined: - "Echo 10/13/2017. This is a very limited study. There is definitely at least a small pericardial effusion. Normal LV function EF 70%. Right ventricular function probably normal but off axis views. IVC about 2 cm but non-collapsible.  Echo report 09/05/2017. Left ventricle is mildly dilated. EF 55 to 60%. Grade 1 diastolic dysfunction. Right ventricular mildly dilated with normal function. RV systolic pressure upper limits normal. Trivial pericardial effusion. Questionable right atrial mass?  Echo report 09/09/2017. Left ventricle is normal in size EF 55%. Right ventricle is normal. Trace MR. No aortic stenosis. Mild tricuspid regurgitation. Left atrium mildly dilated."  - 11/15/17 RHC was normal and continued dyspnea work-up recommended.   - 11/12/17 renal ultrasound showed no evidence of obstructive uropathy (with suboptimal view of left kidney) and unremarkable bladder.   - 11/12/17 BLE venous Duplex was negative for DVT.   - 11/11/17 2V CXR showed no active disease of the chest.  - Spirometry 10/26/17: Result Narrative: The patient's forced vital capacity is reduced at 1.57 L and 56% predicted.It had a significant rise after the administration of  albuterol to 1.91  L and 68% predicted and a 21% rise.The FEV1 is moderately decreased at 1.39 L and 64% predicted, but it increased to 1.66  L and 77% predicted and a 19% rise after the administration of albuterol. The percent FEV1 ratio is normal at a value of 87 after the demonstration bronchodilators.The patient's total lung capacity slightly decreased at 4.15 L and 74% predicted.Of note, the patient is 62.5 inches tall and weighs 199 pounds.The diffusion capacity is slightly decreased at 73% predicted after adjustment for hemoglobin.It corrects to normal after adjustment for alveolar volume since the patient has restriction. Impression: Overall, the data points to his broncho-reactivity and mild restrictive lung disease.Please correlate clinically.  Lexine Baton is reaching out to Dr. Radford Pax for preoperative cardiology input. I also asked her to notify Dr. Kathyrn Sheriff that patient is on a MAO inhibitor. Also discussed MAO inhibitor with anesthesiologist Renold Don, MD. Patient will not need to hold for surgery, but this will need to be considered for his anesthesia plan.   [name] [facility] Short Stay Center/Anesthesiology Phone [phone] 02/20/2018 1:05 PM

## 2018-02-21 NOTE — Telephone Encounter (Signed)
   Primary Cardiologist: No primary care provider on file.  Chart reviewed as part of pre-operative protocol coverage. Patient was seen by Dr. Radford Pax 01/13/18. Has h/o chronic diastolic CHF but not clear of dx as there also seems to be chronic venous stasis, hypoalbuminea, sedentary state contributing. Also with h/o small pericardial effusion, bipolar I, CKD III, HTN. He was tachycardic at that visit but 24 hour holter showed average HR 89bpm. She indicates "4.  Abnormal EKG -EKG demonstrates inferolateral ST abnormality. He had a stress echo in [place] 10/07/2017 that I will try to get the results of." The patient was not describing any chest pain or SOB at that visit. I do not see the stress echo scanned in. I will route urgently to Dr. Radford Pax to review and weigh in on pre-op clearance.  Charlie Pitter, PA-C 02/21/2018, 1:35 PM

## 2018-02-21 NOTE — Telephone Encounter (Signed)
Need to get stress echo results

## 2018-02-22 ENCOUNTER — Encounter: Payer: Self-pay | Admitting: Family Medicine

## 2018-02-22 ENCOUNTER — Ambulatory Visit (INDEPENDENT_AMBULATORY_CARE_PROVIDER_SITE_OTHER): Payer: Medicare Other | Admitting: Family Medicine

## 2018-02-22 ENCOUNTER — Encounter: Payer: Self-pay | Admitting: Physician Assistant

## 2018-02-22 VITALS — BP 100/72 | HR 97 | Temp 97.7°F | Ht 61.0 in | Wt 183.8 lb

## 2018-02-22 DIAGNOSIS — R5383 Other fatigue: Secondary | ICD-10-CM | POA: Diagnosis not present

## 2018-02-22 DIAGNOSIS — N184 Chronic kidney disease, stage 4 (severe): Secondary | ICD-10-CM

## 2018-02-22 DIAGNOSIS — G4733 Obstructive sleep apnea (adult) (pediatric): Secondary | ICD-10-CM

## 2018-02-22 DIAGNOSIS — M1000 Idiopathic gout, unspecified site: Secondary | ICD-10-CM | POA: Diagnosis not present

## 2018-02-22 LAB — URIC ACID: URIC ACID, SERUM: 6.3 mg/dL (ref 4.0–7.8)

## 2018-02-22 LAB — COMPREHENSIVE METABOLIC PANEL
ALBUMIN: 4.2 g/dL (ref 3.5–5.2)
ALT: 9 U/L (ref 0–53)
AST: 13 U/L (ref 0–37)
Alkaline Phosphatase: 59 U/L (ref 39–117)
BUN: 51 mg/dL — AB (ref 6–23)
CHLORIDE: 104 meq/L (ref 96–112)
CO2: 25 mEq/L (ref 19–32)
CREATININE: 2.4 mg/dL — AB (ref 0.40–1.50)
Calcium: 9.1 mg/dL (ref 8.4–10.5)
GFR: 27.97 mL/min — ABNORMAL LOW (ref >60.00)
GLUCOSE: 96 mg/dL (ref 70–99)
POTASSIUM: 4.1 meq/L (ref 3.5–5.1)
SODIUM: 138 meq/L (ref 135–145)
Total Bilirubin: 0.5 mg/dL (ref 0.2–1.2)
Total Protein: 6.5 g/dL (ref 6.0–8.3)

## 2018-02-22 LAB — CBC
HEMATOCRIT: 35.9 % — AB (ref 39.0–52.0)
Hemoglobin: 12.2 g/dL — ABNORMAL LOW (ref 13.0–17.0)
MCHC: 33.8 g/dL (ref 30.0–36.0)
MCV: 93.2 fl (ref 78.0–100.0)
Platelets: 244 10*3/uL (ref 150.0–400.0)
RBC: 3.86 Mil/uL — AB (ref 4.22–5.81)
RDW: 13.8 % (ref 11.5–15.5)
WBC: 8 10*3/uL (ref 4.0–10.5)

## 2018-02-22 LAB — TSH: TSH: 2.15 u[IU]/mL (ref 0.35–4.50)

## 2018-02-22 NOTE — Assessment & Plan Note (Signed)
S: no recent gout flare ups on uloric 40mg . Trying to watch diet Lab Results  Component Value Date   LABURIC 5.2 06/24/2017  A/P: Stable-continue current medications-patient asks for repeat uric acid level

## 2018-02-22 NOTE — Patient Instructions (Addendum)
Please stop by lab before you go  I wish you the best with your surgery tomorrow  How about we go ahead and plan on a 38-month check and in- we have a visit scheduled on the 30th of this month- you can push that back if you would like or keep it if you would like

## 2018-02-22 NOTE — Assessment & Plan Note (Signed)
S: patient has been using cpap for several years. He uses everynight when he sleeps - for 7-8 hours. He feels more rested when he uses this compared to when he does not (but rarely misses). Sleep only interrupted by nocturia. Still does seem to fall asleep easily and experiences fatigue.  A/P: I will complete any needed forms for him to continue his CPAP-this is certainly recommended.  In regards to his fatigue-we will update some labs including TSH

## 2018-02-22 NOTE — Telephone Encounter (Signed)
Surgical date has now been postponed until 12/20. We are trying to locate stress echo results. Clearance pending.

## 2018-02-22 NOTE — H&P (Addendum)
Chief Complaint   Back pain  HPI   HPI: [name] is a 77 y.o. male with longstanding history of lower back pain.  Pains pains across his entire lower back.  He denies radicular component or symptoms consistent with claudication.  Pain is typically only present when he is mobile or standing for long periods of time.    He denies bowel or bladder dysfunction. His MRI demonstrates grade 1 spondylolisthesis with multifactorial stenosis at L4-5.   He presents today for decompression and fusion.  He is without any concerns  Patient Active Problem List   Diagnosis Date Noted  . Chronic diastolic CHF (congestive heart failure) ([place]) 11/30/2017  . Leukocytosis 09/04/2017  . History of fracture of left hip 09/01/2017  . Senile purpura ([place]) 06/22/2017  . Osteoarthritis of spine 03/21/2017  . Chronic low back pain 03/08/2017  . Hypersomnolence 08/20/2016  . Gout 07/07/2016  . Other constipation 01/13/2016  . Primary osteoarthritis of right knee 12/08/2015  . Fatty liver 10/09/2014  . Gallstones 10/09/2014  . Bilateral lower extremity edema 09/25/2014  . Chronic venous insufficiency 08/29/2013  . Right shoulder pain 08/29/2013  . Neuropathy 01/20/2011  . OSA (obstructive sleep apnea) 10/30/2008  . Osteoporosis 09/25/2008  . CKD (chronic kidney disease), stage IV ([place]) 03/01/2008  . Mild depressed bipolar I disorder ([place]) 09/21/2006    PMH: Past Medical History:  Diagnosis Date  . Arthritis    back  . Back pain    reason unknown  . Benign prostatic hyperplasia (BPH) with urinary urge incontinence    sees Dr. Jeffie Pollock   . Bipolar 1 disorder [facility])    sees Dr. Norma Fredrickson  . Chronic diastolic CHF (congestive heart failure) ([place]) 11/30/2017   right Heart cath in [facility] 10/2017 showed normal filling pressures. 2D echo with EF 55-60%, grade 1 DD, MIld RVE with normal RVF (normal RV size with followup echo 09/09/2017), trivial MR/PR, mild TR  . CKD (chronic kidney disease)    stage 3 followed by [facility]  . Depression    takes Parnate daily  . Diverticulitis of colon 05/27/2015  . Essential hypertension 04/11/2007  . Headache(784.0)    occasionally  . Hip fracture ([place]) 09/01/2017   left hip fracture  . History of colon polyps   . Hypothyroidism 10/03/2009   Noted by Dr. Arnoldo Morale- patient states never on medicine- may have been checked due to parnate and abilify   . 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 Joint pain   . Joint swelling   . Osteoporosis    takes Drisdol weekly and Caltrate daily  . Peripheral edema    takes torsemide daily followed by nephrology  . Shingles 05/27/2015  . Sleep apnea    wears c-pap    PSH: Past Surgical History:  Procedure Laterality Date  . CARDIAC CATHETERIZATION    . COLONOSCOPY W/ POLYPECTOMY  03-10-15   per Dr. Havery Moros, adenomatous polyps, repeat in 3 yrs   . HIP FRACTURE SURGERY Left 09/02/2017  . REVERSE SHOULDER ARTHROPLASTY Right 05/22/2013   Procedure: REVERSE SHOULDER ARTHROPLASTY;  Surgeon: Nita Sells, MD;  Location: [place];  Service: Orthopedics;  Laterality: Right;  Right reverse total shoulder  . TONSILLECTOMY    . TOTAL KNEE ARTHROPLASTY Right 12/08/2015   Procedure: TOTAL KNEE ARTHROPLASTY;  Surgeon: Dorna Leitz, MD;  Location: [place];  Service: Orthopedics;  Laterality: Right;    No medications prior  to admission.    SH: Social History   Tobacco Use  . Smoking status: Never Smoker  . Smokeless tobacco: Never Used  Substance Use Topics  . Alcohol use: No    Alcohol/week: 0.0 standard drinks  . Drug use: No    MEDS: Prior to Admission medications   Medication Sig Start Date End Date Taking? Authorizing Provider  acetaminophen (TYLENOL 8 HOUR) 650 MG CR tablet Take 1,300 mg by mouth 2 (two) times daily.    Yes [provider]  ARIPiprazole (ABILIFY) 5 MG tablet Take 1 tablet (5 mg total) by mouth daily.  09/14/11  Yes Ricard Dillon, MD  Calcium-Vitamin D (CALTRATE 600 PLUS-VIT D PO) Take 1 tablet by mouth daily.    Yes [provider]  febuxostat (ULORIC) 40 MG tablet TAKE 1 TABLET(40 MG) BY MOUTH DAILY Patient taking differently: Take 40 mg by mouth daily.  10/14/17  Yes [name], MD  L-Methylfolate-Algae (L-METHYLFOLATE FORTE) [phone] MG CAPS Take 1 capsule by mouth daily. 11/03/15  Yes [provider]  tranylcypromine (PARNATE) 10 MG tablet Take 30 mg by mouth 2 (two) times daily.    Yes [provider]  gabapentin (NEURONTIN) 100 MG capsule Take 2 capsules (200 mg total) by mouth at bedtime. Patient not taking: Reported on 02/09/2018 02/02/18   [name], MD    ALLERGY: No Known Allergies  Social History   Tobacco Use  . Smoking status: Never Smoker  . Smokeless tobacco: Never Used  Substance Use Topics  . Alcohol use: No    Alcohol/week: 0.0 standard drinks     Family History  Problem Relation Age of Onset  . Cancer Mother        unknown type  . Stroke Father   . Cancer Maternal Grandmother   . Appendicitis Paternal Grandfather   . Prostate cancer Brother   . Colon cancer Neg Hx   . Esophageal cancer Neg Hx   . Rectal cancer Neg Hx   . Stomach cancer Neg Hx      ROS   ROS  Exam   There were no vitals filed for this visit. General appearance: WDWN, NAD Eyes: No scleral injection Cardiovascular: Regular rate and rhythm without murmurs, rubs, gallops. No edema or variciosities. Distal pulses normal. Pulmonary: Effort normal, non-labored breathing Musculoskeletal:     Muscle tone upper extremities: Normal    Muscle tone lower extremities: Normal    Motor exam: Upper Extremities Deltoid Bicep Tricep Grip  Right 5/5 5/5 5/5 5/5  Left 5/5 5/5 5/5 5/5   Lower Extremity IP Quad PF DF EHL  Right 5/5 5/5 5/5 5/5 5/5  Left 5/5 5/5 5/5 5/5 5/5   Neurological Mental Status:    - Patient is awake, alert, oriented to  person, place, month, year, and situation    - Patient is able to give a clear and coherent history.    - No signs of aphasia or neglect Cranial Nerves    - II: Visual Fields are full. PERRL    - III/IV/VI: EOMI without ptosis or diploplia.     - V: Facial sensation is grossly normal    - VII: Facial movement is symmetric.     - VIII: hearing is intact to voice    - X: Uvula elevates symmetrically    - XI: Shoulder shrug is symmetric.    - XII: tongue is midline without atrophy or fasciculations.  Sensory: Sensation grossly intact to LT Deep  Tendon Reflexes    - 2+ and symmetric in the biceps and patellae.  Plantars   - Toes are downgoing bilaterally.  Cerebellar    - FNF and HKS are intact bilaterally   Results - Imaging/Labs   Results for orders placed or performed during the hospital encounter of 03/08/18 (from the past 48 hour(s))  CBC     Status: Abnormal   Collection Time: 03/08/18 11:53 AM  Result Value Ref Range   WBC 8.0 4.0 - 10.5 K/uL   RBC 3.59 (L) 4.22 - 5.81 MIL/uL   Hemoglobin 11.2 (L) 13.0 - 17.0 g/dL   HCT 35.5 (L) 39.0 - 52.0 %   MCV 98.9 80.0 - 100.0 fL   MCH 31.2 26.0 - 34.0 pg   MCHC 31.5 30.0 - 36.0 g/dL   RDW 12.5 11.5 - 15.5 %   Platelets 215 150 - 400 K/uL   nRBC 0.0 0.0 - 0.2 %    Comment: Performed at [facility], [place] [address], [place], [place] [address]  Basic metabolic panel     Status: Abnormal   Collection Time: 03/08/18 11:53 AM  Result Value Ref Range   Sodium 139 135 - 145 mmol/L   Potassium 4.1 3.5 - 5.1 mmol/L   Chloride 105 98 - 111 mmol/L   CO2 23 22 - 32 mmol/L   Glucose, Bld 152 (H) 70 - 99 mg/dL   BUN 42 (H) 8 - 23 mg/dL   Creatinine, Ser 2.63 (H) 0.61 - 1.24 mg/dL   Calcium 9.3 8.9 - 10.3 mg/dL   GFR calc non Af Amer 22 (L) >60 mL/min   GFR calc Af Amer 26 (L) >60 mL/min   Anion gap 11 5 - 15    Comment: Performed at [facility], [place] [address], [place], [place] [address]  Type and screen [place]     Status: None   Collection Time: 03/08/18 11:55 AM  Result Value Ref Range   ABO/RH(D) A POS    Antibody Screen NEG    Sample Expiration 03/22/2018    Extend sample reason      NO TRANSFUSIONS OR PREGNANCY IN THE PAST 3 MONTHS Performed at [facility], [place] [address], [place],  [address]     No results found.  IMAGING: MRI of the lumbar spine dated 07/12/2017 was again reviewed.  Primary finding is again seen at L4-5 where there is significant bilateral facet arthropathy with ligamentous hypertrophy and grade 1 spondylolisthesis with uncovered disc resulting in moderate to severe central stenosis.   Impression/Plan   77 y.o. male with chronic primarily low back pain who continues to have significant symptoms limiting normal daily activities despite multiple different conservative treatments including physical therapy, injection therapy, and medical therapy.  He does have a grade 1 spondylolisthesis with facet arthropathy and moderate to severe multifactorial stenosis at L4-5.  We will proceed with decompression and fusion at L4-5 consisting of placement of interbody biomechanical device, interbody arthrodesis, and posterior nonsegmental instrumentation with cortical pedicle screws  While in the office risks benefits and alternatives were discussed.  The possibility of unchanged or  Or even worsening back pain after surgery was also discussed.  Patient stated in own language understanding and wished to proceed.  We are awaiting Cardiology clearance as of 02/22/2018.

## 2018-02-22 NOTE — Assessment & Plan Note (Signed)
S: has been complaint with torsemide. States leg swelling is not worsening. Down 5 lbs from last visit in office.  A/P: Stable-continue current medications-update renal function

## 2018-02-22 NOTE — Telephone Encounter (Signed)
I am not covering pre-op today but will copy/paste the following - Dr. Radford Pax routed to her nurse to try and get copy of stress echo result to clarify clearance decision  Turner, Eber Hong, MD  Jacinta Shoe, PA-C; Charlie Pitter, PA-C Cc: Sarina Ill, RN        Ben please try to find out what happened to the faxed copy of stress test that we received.    Traci   Previous Messages    ----- Message -----  From: Rutherford Limerick  Sent: 02/22/2018  8:49 AM EST  To: Charlie Pitter, PA-C, Sueanne Margarita, MD  Subject: stress echo?                   Dr. Radford Pax and Melina Copa,  FYI: I am a PA with anesthesia a [facility]. I hope you are more successful then we have been in trying to find a stress echo on [employer]. I reviewed nearly all cardiology notes in [facility] and his hospital discharge summaries and there is no mention of a stress echo (other than possibly scanned in stress echo under [place]). I spoke with non-clinical staff at Dr. Amparo Bristol office (with [facility], [phone]) who clicked on the scanned documents that were from [place] and said they were two transthoracic echocardiograms from June. She did not see a stress echo. You might see if medical records at [facility] says otherwise, but otherwise a stress echo is not mentioned in their Care Everywhere notes.   [name]  [facility] Short Stay Center/Anesthesiology  Phone [phone]  02/22/2018 9:11 AM

## 2018-02-22 NOTE — Progress Notes (Signed)
Subjective:  [name] is a 77 y.o. year old very pleasant male patient who presents for/with See problem oriented charting ROS-admits to fatigue without chest pain or shortness of breath.  Swelling is modestly improved.  No fever or chills.  Past Medical History-  Patient Active Problem List   Diagnosis Date Noted  . Chronic diastolic CHF (congestive heart failure) ([place]) 11/30/2017    Priority: High  . Mild depressed bipolar I disorder ([place]) 09/21/2006    Priority: High  . Chronic low back pain 03/08/2017    Priority: Medium  . Gout 07/07/2016    Priority: Medium  . Fatty liver 10/09/2014    Priority: Medium  . OSA (obstructive sleep apnea) 10/30/2008    Priority: Medium  . Osteoporosis 09/25/2008    Priority: Medium  . CKD (chronic kidney disease), stage IV ([place]) 03/01/2008    Priority: Medium  . Senile purpura ([place]) 06/22/2017    Priority: Low  . Hypersomnolence 08/20/2016    Priority: Low  . Other constipation 01/13/2016    Priority: Low  . Primary osteoarthritis of right knee 12/08/2015    Priority: Low  . Gallstones 10/09/2014    Priority: Low  . Bilateral lower extremity edema 09/25/2014    Priority: Low  . Chronic venous insufficiency 08/29/2013    Priority: Low  . Right shoulder pain 08/29/2013    Priority: Low  . Edema 01/13/2018  . Closed left hip fracture, initial encounter ([place]) 11/30/2017  . Shortness of breath 11/11/2017  . Bilateral leg edema 11/11/2017  . Bipolar 1 disorder ([place]) 10/15/2017  . Hyperuricemia 10/15/2017  . Abnormal LFTs 09/04/2017  . Acute respiratory failure with hypoxia ([place]) 09/04/2017  . Anasarca 09/04/2017  . Bilateral atelectasis 09/04/2017  . Fever 09/04/2017  . Hypoventilation associated with obesity ([place]) 09/04/2017  . Ileus, postoperative ([place]) 09/04/2017  . Leukocytosis 09/04/2017  . Closed intertrochanteric fracture of left hip ([place]) 09/01/2017  . CKD (chronic kidney disease) 09/01/2017  . Hip fracture ([place])  09/01/2017  . Osteoarthritis of spine 03/21/2017  . Neuropathy 01/20/2011    Medications- reviewed and updated Current Outpatient Medications  Medication Sig Dispense Refill  . acetaminophen (TYLENOL 8 HOUR) 650 MG CR tablet Take 1,300 mg by mouth 2 (two) times daily.     . ARIPiprazole (ABILIFY) 5 MG tablet Take 1 tablet (5 mg total) by mouth daily. 90 tablet 3  . Calcium-Vitamin D (CALTRATE 600 PLUS-VIT D PO) Take 1 tablet by mouth daily.     . febuxostat (ULORIC) 40 MG tablet TAKE 1 TABLET(40 MG) BY MOUTH DAILY (Patient taking differently: Take 40 mg by mouth daily. ) 30 tablet 1  . L-Methylfolate-Algae (L-METHYLFOLATE FORTE) 15-90.314 MG CAPS Take 1 capsule by mouth daily.  0  . tranylcypromine (PARNATE) 10 MG tablet Take 30 mg by mouth 2 (two) times daily.      No current facility-administered medications for this visit.     Objective: BP 100/72 (BP Location: Left Arm, Patient Position: Sitting, Cuff Size: Large)   Pulse 97   Temp 97.7 F (36.5 C) (Oral)   Ht 5\' 1"  (1.549 m)   Wt 183 lb 12.8 oz (83.4 kg)   SpO2 93%   BMI 34.73 kg/m  Gen: NAD, resting comfortably, sleeping as I enter the room-snoring CV: RRR no murmurs rubs or gallops Lungs: CTAB no crackles, wheeze, rhonchi Abdomen: soft/nontender/nondistended/normal bowel sounds.   Ext:1+ edema bilaterally Skin: warm, dry Neuro: grossly normal, moves all extremities  Assessment/Plan:  Other notes: 1.back  surgery tomorrow.  I wished him the best   Gout S: no recent gout flare ups on uloric 40mg . Trying to watch diet Lab Results  Component Value Date   LABURIC 5.2 06/24/2017  A/P: Stable-continue current medications-patient asks for repeat uric acid level  OSA (obstructive sleep apnea) S: patient has been using cpap for several years. He uses everynight when he sleeps - for 7-8 hours. He feels more rested when he uses this compared to when he does not (but rarely misses). Sleep only interrupted by nocturia. Still  does seem to fall asleep easily and experiences fatigue.  A/P: I will complete any needed forms for him to continue his CPAP-this is certainly recommended.  In regards to his fatigue-we will update some labs including TSH  CKD (chronic kidney disease), stage IV ([place]) S: has been complaint with torsemide. States leg swelling is not worsening. Down 5 lbs from last visit in office.  A/P: Stable-continue current medications-update renal function  He wants to keep his appointment later this month to check in after his surgery-offered to push this out to 3 months but he declined Future Appointments  Date Time Provider [place]  03/20/2018 10:45 AM [name], MD [facility]   Lab/Order associations: Idiopathic gout, unspecified chronicity, unspecified site - Plan: Uric acid  Fatigue, unspecified type - Plan: CBC, Comprehensive metabolic panel, TSH  OSA (obstructive sleep apnea)  CKD (chronic kidney disease), stage IV ([place])  Return precautions advised.  Garret Reddish, MD

## 2018-02-23 NOTE — Telephone Encounter (Signed)
Follow Up   Pt is calling wanting to know why Dr. Radford Pax states he is at heart risk and cant have his operation. Please call

## 2018-02-23 NOTE — Telephone Encounter (Signed)
Received fax from [facility], all the results were faxed and no stress echo was present. All the results they sent were available in Care Everywhere.

## 2018-02-23 NOTE — Telephone Encounter (Signed)
Preop is not currently involved in the ordering of the test, will route to Dr. Theodosia Blender nurse to handle Melina Copa PA-C

## 2018-02-23 NOTE — Telephone Encounter (Signed)
Since patient has not had an ischemic evaluation and EKG showed ST abnormality I would like him to have a Lexiscan myoview prior to back surgery

## 2018-02-23 NOTE — Telephone Encounter (Signed)
Spoke with the patient, explained the reason for the lexiscan myoview and he accepted. Instructions printed and left at the front. Sending a message to [facility] to schedule.

## 2018-02-27 DIAGNOSIS — G4733 Obstructive sleep apnea (adult) (pediatric): Secondary | ICD-10-CM | POA: Diagnosis not present

## 2018-02-28 ENCOUNTER — Telehealth: Payer: Self-pay | Admitting: Cardiology

## 2018-02-28 ENCOUNTER — Ambulatory Visit (HOSPITAL_COMMUNITY): Payer: Medicare Other | Attending: Cardiovascular Disease

## 2018-02-28 DIAGNOSIS — R9431 Abnormal electrocardiogram [ECG] [EKG]: Secondary | ICD-10-CM | POA: Insufficient documentation

## 2018-02-28 LAB — MYOCARDIAL PERFUSION IMAGING
CHL CUP NUCLEAR SDS: 0
CHL CUP RESTING HR STRESS: 85 {beats}/min
LV dias vol: 74 mL (ref 62–150)
LV sys vol: 32 mL
NUC STRESS TID: 0.84
Peak HR: 89 {beats}/min
SRS: 0
SSS: 0

## 2018-02-28 MED ORDER — TECHNETIUM TC 99M TETROFOSMIN IV KIT
10.1000 | PACK | Freq: Once | INTRAVENOUS | Status: AC | PRN
  Administered 2018-02-28: 10.1 via INTRAVENOUS
  Filled 2018-02-28: qty 11

## 2018-02-28 MED ORDER — REGADENOSON 0.4 MG/5ML IV SOLN
0.4000 mg | Freq: Once | INTRAVENOUS | Status: AC
  Administered 2018-02-28: 0.4 mg via INTRAVENOUS

## 2018-02-28 MED ORDER — TECHNETIUM TC 99M TETROFOSMIN IV KIT
31.7000 | PACK | Freq: Once | INTRAVENOUS | Status: AC | PRN
  Administered 2018-02-28: 31.7 via INTRAVENOUS
  Filled 2018-02-28: qty 32

## 2018-02-28 NOTE — Telephone Encounter (Signed)
Nuclear stress test showed normal LVF and no ischemia.  His revised cardiac risk score for surgery is moderate risk with 6.6% risk of major cardiac event in the perioperative period due to history of CHF and Creatinine > 2.

## 2018-02-28 NOTE — Telephone Encounter (Signed)
Patient came in for Myocardial perfusion said he never received the results for the monitor he wore, he would like to speak with someone

## 2018-02-28 NOTE — Telephone Encounter (Signed)
Dr. Radford Pax, Brantley Fling was done yesterday- low risk. Cleared for surgery now?  Please route response back to P CV DIV PREOP  Thanks

## 2018-02-28 NOTE — Telephone Encounter (Signed)
Spoke with the patient he wanted to go over his Holter monitor results again. He expressed understanding and had no further questions.

## 2018-03-01 ENCOUNTER — Other Ambulatory Visit: Payer: Self-pay | Admitting: Family Medicine

## 2018-03-01 NOTE — Telephone Encounter (Signed)
   Primary Cardiologist: Fransico Him, MD  Chart reviewed as part of pre-operative protocol coverage. Patient was contacted 03/01/2018 in reference to pre-operative risk assessment for pending surgery as outlined below.  [name] was last seen on 01/13/2018 by Dr. Radford Pax.  The patient underwent cardiac stress testing on 02/28/2018 that showed normal LV function and no ischemia.  His revised cardiac risk score for surgery is moderate risk with 6.6% risk of major cardiac event in the perioperative period due to history of CHF and Creatinine > 2.    Therefore, based on ACC/AHA guidelines, the patient would be at acceptable risk for the planned procedure without further cardiovascular testing.   I will route this recommendation to the requesting party via Epic fax function and remove from pre-op pool.  Please call with questions.  Daune Perch, NP 03/01/2018, 2:16 PM

## 2018-03-01 NOTE — Progress Notes (Addendum)
Patient's surgery date has changed to 12/20.  Labs initially drawn on 11/27.  Those labs will be 3 days old.  Redraw on those labs will need to be determined by anesthesiologist assigned to this case. I did call blood bank concerning sample expiration.  May use same band & #  Will need a redraw DOS. Only 1 sheet of new labels placed in chart.

## 2018-03-02 NOTE — Progress Notes (Signed)
Anesthesia Chart Review:  Case:  [id number] Date/Time:  03/10/18 1243   Procedure:  POSTERIOR LUMBAR INTERBODY FUSION LUMBAR 4- LUMBAR 5, BIOMECHANICAL DEVICE, INTERBODY ARTHRODESIS, POSTERIOR NON-SEGMENTAL INSTRUMENTATION (N/A ) - POSTERIOR LUMBAR INTERBODY FUSION LUMBAR 4- LUMBAR 5, BIOMECHANICAL DEVICE, INTERBODY ARTHRODESIS, POSTERIOR NON-SEGMENTAL INSTRUMENTATION   Anesthesia type:  General   Pre-op diagnosis:  SPONDYLOLISTHESIS, LUMBAR REGION   Location:  [facility] OR ROOM 20 / [place] [facility]   Surgeon:  Consuella Lose, MD      DISCUSSION: Patient is a 77 year old male scheduled for the above procedure. Surgery was initially scheduled for 02/23/18, but delayed until further cardiac testing. Since then he has had a non-ischemic stress test. Per cardiology note by Daune Perch, MD, "His revised cardiac risk score for surgery is moderate risk with 6.6% risk of major cardiac event in the perioperative period due to history of CHF and Creatinine >2.  Therefore, based on ACC/AHA guidelines, the patient would be at acceptable risk for the planned procedure without further cardiovascular testing."  History includes never smoker, chronic diastolic HF (thought to be inaccurate diagnosis given normal RHC per cardiologist Fransico Him, MD who suspects chronic venous insufficiency instead), CKD (now stage 4), OSA, bipolar 1 disorder.  He was visiting his daughter in [place] in June and fell and fractured his hip. He underwent surgical repair which was complicated by respiratory failure requiring BiPAP as well as acute on CKD stage 3 and post op ileus  (see notes in Care Everywhere)   Medications include tranylcypromine (Parnate), a MAO inhibitor, for depression. (This was discussed with anesthesiologist Renold Don, MD. Patient will not need to hold for surgery, but this will need to be considered for his anesthesia plan. I also notified Nikki at Dr. Cleotilde Neer office that patient is on a MAOI.)  Patient  is coming in on 03/08/18 for updated labs.    VS: There were no vitals taken for this visit.  PROVIDERS: [name], MD is PCP Fransico Him, MD is cardiologist Elmarie Shiley, Md is nephrologist. (01/23/18 note scanned under Media tab.)  LABS: He is coming in for updated labs on 03/08/18. (all labs ordered are listed, but only abnormal results are displayed)  Labs Reviewed - No data to display   Spirometry 10/26/17 ([place]): Result Narrative: The patient's forced vital capacity is reduced at 1.57 L and 56% predicted.It had a significant rise after the administration of  albuterol to 1.91 L and 68% predicted and a 21% rise.The FEV1 is moderately decreased at 1.39 L and 64% predicted, but it increased to 1.66  L and 77% predicted and a 19% rise after the administration of albuterol. The percent FEV1 ratio is normal at a value of 87 after the demonstration bronchodilators.The patient's total lung capacity slightly decreased at 4.15 L and 74% predicted.Of note, the patient is 62.5 inches tall and weighs 199 pounds.The diffusion capacity is slightly decreased at 73% predicted after adjustment for hemoglobin.It corrects to normal after adjustment for alveolar volume since the patient has restriction. Impression: Overall, the data points to his broncho-reactivity and mild restrictive lung disease.Please correlate clinically.   IMAGES: CXR 11/29/17: Result Impression: No active cardiopulmonary disease.    EKG: 01/13/18: ST at 107 bpm. ST/T wave abnormality, consider inferior ischemia.   CV: Nuclear Stress Test 02/28/18:  The left ventricular ejection fraction is normal (55-65%).  Nuclear stress EF: 57%.  There was no ST segment deviation noted during stress.  The study is normal.  This is a low risk study. Normal resting and stress perfusion. No ischemia or infarction EF 57%  Holter monitor 01/20/18:   Sinus bradycardia, normal sinus  rhythm and sinus tachycardia. The average heart rate was 89 bpm. The heart rate ranged from 50 to 119 bpm.  Occasional PACs and atrial couplets  R heart cath 11/15/17 ([place]):  1.Normal central venous pressure (7 mmHg). 2.Normal pulmonary artery pressures (36/12 mmHg, mean 22 mmHg). 3.Normal left sided filling pressures (PCWP 10 mmHg). 4.Normal cardiac output and cardiac index (CO 5.8 L/min, CI 3.2  L/min/m2).  Echo 10/13/17 ([place]):   Limited echo to evaluate lower extremity edema.  Normal-sized left ventricular cavity. Normal left ventricular wall thickness. Preserved systolic function. Estimated EF = 70%.  Normal-sized right ventricular cavity with preserved systolic function.  Normal-sized left atrium.  Mild aortic valve sclerosis  Mild mitral valve sclerotic changes  Trace tricuspid valve regurgitation with RVSP of 36 mmHg.  Normal-sized right atrium.  IVC is <2.1cm.  Trace to small nearly circumferential pericardial effusion with no evidence of tamponade.  Renal/Bladder U/S 11/12/17 ([place]): IMPRESSION: 1.No evidence of obstructive uropathy.Suboptimal visualization of the left kidney. 2.Unremarkable bladder.The patient was unable to void for post void residual to be assessed.  BLE venous Duplex 11/12/17 ([place]): IMPRESSION: No evidence for right or left - sided deep venous thrombosis.   Past Medical History:  Diagnosis Date  . Arthritis    back  . Back pain    reason unknown  . Benign prostatic hyperplasia (BPH) with urinary urge incontinence    sees Dr. Jeffie Pollock   . Bipolar 1 disorder [facility])    sees Dr. Norma Fredrickson  . Chronic diastolic CHF (congestive heart failure) ([place]) 11/30/2017   right Heart cath in [facility] 10/2017 showed normal filling pressures. 2D echo with EF 55-60%, grade 1 DD, MIld RVE with normal RVF (normal RV size  with followup echo 09/09/2017), trivial MR/PR, mild TR  . CKD (chronic kidney disease)    stage 3 followed by [facility]  . Depression    takes Parnate daily  . Diverticulitis of colon 05/27/2015  . Essential hypertension 04/11/2007  . Headache(784.0)    occasionally  . Hip fracture ([place]) 09/01/2017   left hip fracture  . History of colon polyps   . Hypothyroidism 10/03/2009   Noted by Dr. Arnoldo Morale- patient states never on medicine- may have been checked due to parnate and abilify   . 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 Joint pain   . Joint swelling   . Osteoporosis    takes Drisdol weekly and Caltrate daily  . Peripheral edema    takes torsemide daily followed by nephrology  . Shingles 05/27/2015  . Sleep apnea    wears c-pap    Past Surgical History:  Procedure Laterality Date  . CARDIAC CATHETERIZATION    . COLONOSCOPY W/ POLYPECTOMY  03-10-15   per Dr. Havery Moros, adenomatous polyps, repeat in 3 yrs   . HIP FRACTURE SURGERY Left 09/02/2017  . REVERSE SHOULDER ARTHROPLASTY Right 05/22/2013   Procedure: REVERSE SHOULDER ARTHROPLASTY;  Surgeon: Nita Sells, MD;  Location: [facility];  Service: Orthopedics;  Laterality: Right;  Right reverse total shoulder  . TONSILLECTOMY    . TOTAL KNEE ARTHROPLASTY Right 12/08/2015   Procedure: TOTAL KNEE ARTHROPLASTY;  Surgeon: Dorna Leitz, MD;  Location[facility]  OR;  Service: Orthopedics;  Laterality: Right;    MEDICATIONS: . acetaminophen (TYLENOL 8 HOUR) 650 MG CR tablet  . ARIPiprazole (ABILIFY) 5 MG tablet  . Calcium-Vitamin D (CALTRATE 600 PLUS-VIT D PO)  . febuxostat (ULORIC) 40 MG tablet  . febuxostat (ULORIC) 40 MG tablet  . L-Methylfolate-Algae (L-METHYLFOLATE FORTE) 15-90.314 MG CAPS  . tranylcypromine (PARNATE) 10 MG tablet   No current facility-administered medications for this encounter.     [name] [facility] Short Stay Center/Anesthesiology Phone  [phone] 03/02/2018 10:34 AM

## 2018-03-06 ENCOUNTER — Telehealth: Payer: Self-pay | Admitting: Family Medicine

## 2018-03-06 NOTE — Telephone Encounter (Signed)
See note  Copied from [place] [id number]. Topic: General - Inquiry >> Mar 06, 2018 11:52 AM Margot Ables wrote: Reason for CRM: pt asking that a copy of recent labs be mailed to him. He also would like a call to notify him if gout is still in his blood.   [address]  [place] [address] [phone] (M)

## 2018-03-07 ENCOUNTER — Telehealth: Payer: Self-pay | Admitting: Family Medicine

## 2018-03-07 NOTE — Telephone Encounter (Signed)
See note  Copied from [place] [id number]. Topic: General - Inquiry >> Mar 06, 2018 11:52 AM Margot Ables wrote: Reason for CRM: pt asking that a copy of recent labs be mailed to him. He also would like a call to notify him if gout is still in his blood.   [address]  [place] [address] [phone] (M) >> Mar 07, 2018  2:08 PM Windy Kalata wrote: Pt called again today asking if he should continue taking his medication for his gout, please advise.  Best call back is [phone]

## 2018-03-07 NOTE — Pre-Procedure Instructions (Signed)
[name]  03/07/2018        Your procedure is scheduled on 02/23/2018.  Report to [facility] Admitting at 11:00A.M.  Call this number if you have problems the morning of surgery:  [phone]   Remember:  Do not eat or drink after midnight.    Take these medicines the morning of surgery with A SIP OF WATER: Acetaminophen (Tylenol) Aripiprazole (Abilify) Febuxostat (Uloric) Tranylcypromine (Parnate)  As of today, STOP taking any Aspirin (unless otherwise instructed by your surgeon), Aleve, Naproxen, Ibuprofen, Motrin, Advil, Goody's, BC's, all herbal medications, fish oil, and all vitamins      Do not wear jewelry.  Do not wear lotions, powders, or colognes, or deodorant.  Men may shave face and neck.  Do not bring valuables to the hospital.  [facility] is not responsible for any belongings or valuables.  Contacts, eyeglasses, hearing aids, dentures or bridgework may not be worn into surgery.  Leave your suitcase in the car.  After surgery it may be brought to your room.  For patients admitted to the hospital, discharge time will be determined by your treatment team.  Patients discharged the day of surgery will not be allowed to drive home.   Name and phone number of your driver:    Special instructions:   [place]- Preparing For Surgery  Before surgery, you can play an important role. Because skin is not sterile, your skin needs to be as free of germs as possible. You can reduce the number of germs on your skin by washing with CHG (chlorahexidine gluconate) Soap before surgery.  CHG is an antiseptic cleaner which kills germs and bonds with the skin to continue killing germs even after washing.    Oral Hygiene is also important to reduce your risk of infection.  Remember - BRUSH YOUR TEETH THE MORNING OF SURGERY WITH YOUR REGULAR TOOTHPASTE  Please do not use if you have an allergy to CHG or antibacterial soaps. If your skin becomes  reddened/irritated stop using the CHG.  Do not shave (including legs and underarms) for at least 48 hours prior to first CHG shower. It is OK to shave your face.  Please follow these instructions carefully.   1. Shower the NIGHT BEFORE SURGERY and the MORNING OF SURGERY with CHG.   2. If you chose to wash your hair, wash your hair first as usual with your normal shampoo.  3. After you shampoo, rinse your hair and body thoroughly to remove the shampoo.  4. Use CHG as you would any other liquid soap. You can apply CHG directly to the skin and wash gently with a scrungie or a clean washcloth.   5. Apply the CHG Soap to your body ONLY FROM THE NECK DOWN.  Do not use on open wounds or open sores. Avoid contact with your eyes, ears, mouth and genitals (private parts). Wash Face and genitals (private parts)  with your normal soap.  6. Wash thoroughly, paying special attention to the area where your surgery will be performed.  7. Thoroughly rinse your body with warm water from the neck down.  8. DO NOT shower/wash with your normal soap after using and rinsing off the CHG Soap.  9. Pat yourself dry with a CLEAN TOWEL.  10. Wear CLEAN PAJAMAS to bed the night before surgery, wear comfortable clothes the morning of surgery  11. Place CLEAN SHEETS on your bed the night of your first shower and DO NOT SLEEP WITH  PETS.    Day of Surgery: Shower as stated above. Do not apply any deodorants/lotions.  Please wear clean clothes to the hospital/surgery center.   Remember to brush your teeth WITH YOUR REGULAR TOOTHPASTE.    Please read over the following fact sheets that you were given.

## 2018-03-08 ENCOUNTER — Encounter (HOSPITAL_COMMUNITY)
Admission: RE | Admit: 2018-03-08 | Discharge: 2018-03-08 | Disposition: A | Discharge status: Home / Self Care | Payer: Medicare Other | Source: Ambulatory Visit | Attending: Neurosurgery | Admitting: Neurosurgery

## 2018-03-08 ENCOUNTER — Other Ambulatory Visit: Payer: Self-pay

## 2018-03-08 ENCOUNTER — Encounter (HOSPITAL_COMMUNITY): Payer: Self-pay

## 2018-03-08 DIAGNOSIS — Z01812 Encounter for preprocedural laboratory examination: Secondary | ICD-10-CM | POA: Insufficient documentation

## 2018-03-08 DIAGNOSIS — N184 Chronic kidney disease, stage 4 (severe): Secondary | ICD-10-CM

## 2018-03-08 DIAGNOSIS — M4690 Unspecified inflammatory spondylopathy, site unspecified: Secondary | ICD-10-CM | POA: Diagnosis not present

## 2018-03-08 DIAGNOSIS — I130 Hypertensive heart and chronic kidney disease with heart failure and stage 1 through stage 4 chronic kidney disease, or unspecified chronic kidney disease: Secondary | ICD-10-CM | POA: Insufficient documentation

## 2018-03-08 DIAGNOSIS — E039 Hypothyroidism, unspecified: Secondary | ICD-10-CM

## 2018-03-08 DIAGNOSIS — M48061 Spinal stenosis, lumbar region without neurogenic claudication: Secondary | ICD-10-CM | POA: Diagnosis not present

## 2018-03-08 DIAGNOSIS — I5032 Chronic diastolic (congestive) heart failure: Secondary | ICD-10-CM | POA: Diagnosis not present

## 2018-03-08 DIAGNOSIS — Z96611 Presence of right artificial shoulder joint: Secondary | ICD-10-CM | POA: Diagnosis not present

## 2018-03-08 DIAGNOSIS — Z8719 Personal history of other diseases of the digestive system: Secondary | ICD-10-CM | POA: Diagnosis not present

## 2018-03-08 DIAGNOSIS — Z79899 Other long term (current) drug therapy: Secondary | ICD-10-CM

## 2018-03-08 DIAGNOSIS — M810 Age-related osteoporosis without current pathological fracture: Secondary | ICD-10-CM

## 2018-03-08 DIAGNOSIS — F319 Bipolar disorder, unspecified: Secondary | ICD-10-CM

## 2018-03-08 DIAGNOSIS — K760 Fatty (change of) liver, not elsewhere classified: Secondary | ICD-10-CM | POA: Diagnosis not present

## 2018-03-08 DIAGNOSIS — Z823 Family history of stroke: Secondary | ICD-10-CM | POA: Diagnosis not present

## 2018-03-08 DIAGNOSIS — M4316 Spondylolisthesis, lumbar region: Secondary | ICD-10-CM | POA: Insufficient documentation

## 2018-03-08 DIAGNOSIS — G4733 Obstructive sleep apnea (adult) (pediatric): Secondary | ICD-10-CM

## 2018-03-08 DIAGNOSIS — M1711 Unilateral primary osteoarthritis, right knee: Secondary | ICD-10-CM | POA: Diagnosis not present

## 2018-03-08 LAB — CBC
HCT: 35.5 % — ABNORMAL LOW (ref 39.0–52.0)
Hemoglobin: 11.2 g/dL — ABNORMAL LOW (ref 13.0–17.0)
MCH: 31.2 pg (ref 26.0–34.0)
MCHC: 31.5 g/dL (ref 30.0–36.0)
MCV: 98.9 fL (ref 80.0–100.0)
Platelets: 215 10*3/uL (ref 150–400)
RBC: 3.59 MIL/uL — ABNORMAL LOW (ref 4.22–5.81)
RDW: 12.5 % (ref 11.5–15.5)
WBC: 8 10*3/uL (ref 4.0–10.5)
nRBC: 0 % (ref 0.0–0.2)

## 2018-03-08 LAB — BASIC METABOLIC PANEL
ANION GAP: 11 (ref 5–15)
BUN: 42 mg/dL — ABNORMAL HIGH (ref 8–23)
CHLORIDE: 105 mmol/L (ref 98–111)
CO2: 23 mmol/L (ref 22–32)
Calcium: 9.3 mg/dL (ref 8.9–10.3)
Creatinine, Ser: 2.63 mg/dL — ABNORMAL HIGH (ref 0.61–1.24)
GFR calc non Af Amer: 22 mL/min — ABNORMAL LOW (ref >60)
GFR, EST AFRICAN AMERICAN: 26 mL/min — AB (ref >60)
Glucose, Bld: 152 mg/dL — ABNORMAL HIGH (ref 70–99)
POTASSIUM: 4.1 mmol/L (ref 3.5–5.1)
Sodium: 139 mmol/L (ref 135–145)

## 2018-03-08 LAB — TYPE AND SCREEN
ABO/RH(D): A POS
ANTIBODY SCREEN: NEGATIVE

## 2018-03-08 NOTE — Progress Notes (Signed)
No changes to Pt's health history since last PAT appt.  Pt's surgery rescheduled for cardiac testing. Cardiac clearance note in Epic.  Labs redrawn.  No need to re-sample nasal PCR, results negative and good for 30 days.  Anesthesia already reviewed chart. See notes.

## 2018-03-08 NOTE — Telephone Encounter (Signed)
Called pt, went over lab results/recommendations regarding uric acid levels. Pt would like to know if he can stop taking the Uloric. He has not had any sx of gout flare-up. I told him that his numbers may be controlled because he is on the Uloric but he still expressed interest in d/c Uloric. He would like to double check with Dr. Yong Channel to see if it would be OK to d/c Uloric.   Forwarding to Dr. Yong Channel to advise.

## 2018-03-08 NOTE — Telephone Encounter (Signed)
Spoke to pt told him need to continue Uloric per Dr. Yong Channel. Pt verbalized understanding.

## 2018-03-08 NOTE — Telephone Encounter (Signed)
He needs to continue the Uloric.  Thanks, Annie Main

## 2018-03-08 NOTE — Telephone Encounter (Addendum)
Per lab notes -  Notes recorded by [employer], Lonia Mad, LPN on 83/07/755 at 32:25 AM EST Called and spoke with patient who verbalized understanding of lab results ------  Notes recorded by [name], MD on 02/23/2018 at 12:05 PM EST Your CBC was stable (blood counts, infection fighting cells, platelets). Your anemia has slightly improved.  Your CMET was stable (kidney, liver, and electrolytes, blood sugar). You continue to have stage IV kidney disease with numbers similar to previous.  Uric acid hair high at 6.3 but if you are not having flares I dont want to change your medication Your thyroid was normal.   Lab letter mailed to pt.

## 2018-03-08 NOTE — Pre-Procedure Instructions (Signed)
[name]  03/08/2018        Your procedure is scheduled on March 10, 2018.  Report to [facility] Admitting at 11:00 A.M.  Call this number if you have problems the morning of surgery:  [phone]   Remember:  Do not eat or drink after midnight.    Take these medicines the morning of surgery with A SIP OF WATER: Acetaminophen (Tylenol) Aripiprazole (Abilify) Febuxostat (Uloric) Tranylcypromine (Parnate)  As of today, STOP taking any Aspirin (unless otherwise instructed by your surgeon), Aleve, Naproxen, Ibuprofen, Motrin, Advil, Goody's, BC's, all herbal medications, fish oil, and all vitamins     Do not wear jewelry.  Do not wear lotions, powders, or colognes, or deodorant.  Men may shave face and neck.  Do not bring valuables to the hospital.  [facility] is not responsible for any belongings or valuables.  Contacts, eyeglasses, hearing aids, dentures or bridgework may not be worn into surgery.  Leave your suitcase in the car.  After surgery it may be brought to your room.  For patients admitted to the hospital, discharge time will be determined by your treatment team.  Patients discharged the day of surgery will not be allowed to drive home.   Name and phone number of your driver:    Special instructions:   [place]- Preparing For Surgery  Before surgery, you can play an important role. Because skin is not sterile, your skin needs to be as free of germs as possible. You can reduce the number of germs on your skin by washing with CHG (chlorahexidine gluconate) Soap before surgery.  CHG is an antiseptic cleaner which kills germs and bonds with the skin to continue killing germs even after washing.    Oral Hygiene is also important to reduce your risk of infection.  Remember - BRUSH YOUR TEETH THE MORNING OF SURGERY WITH YOUR REGULAR TOOTHPASTE  Please do not use if you have an allergy to CHG or antibacterial soaps. If your skin becomes  reddened/irritated stop using the CHG.  Do not shave (including legs and underarms) for at least 48 hours prior to first CHG shower. It is OK to shave your face.  Please follow these instructions carefully.   1. Shower the NIGHT BEFORE SURGERY and the MORNING OF SURGERY with CHG.   2. If you chose to wash your hair, wash your hair first as usual with your normal shampoo.  3. After you shampoo, rinse your hair and body thoroughly to remove the shampoo.  4. Use CHG as you would any other liquid soap. You can apply CHG directly to the skin and wash gently with a scrungie or a clean washcloth.   5. Apply the CHG Soap to your body ONLY FROM THE NECK DOWN.  Do not use on open wounds or open sores. Avoid contact with your eyes, ears, mouth and genitals (private parts). Wash Face and genitals (private parts)  with your normal soap.  6. Wash thoroughly, paying special attention to the area where your surgery will be performed.  7. Thoroughly rinse your body with warm water from the neck down.  8. DO NOT shower/wash with your normal soap after using and rinsing off the CHG Soap.  9. Pat yourself dry with a CLEAN TOWEL.  10. Wear CLEAN PAJAMAS to bed the night before surgery, wear comfortable clothes the morning of surgery  11. Place CLEAN SHEETS on your bed the night of your first shower and DO NOT  SLEEP WITH PETS.    Day of Surgery: Shower as stated above. Do not apply any deodorants/lotions.  Please wear clean clothes to the hospital/surgery center.   Remember to brush your teeth WITH YOUR REGULAR TOOTHPASTE.    Please read over the following fact sheets that you were given.

## 2018-03-10 ENCOUNTER — Inpatient Hospital Stay (HOSPITAL_COMMUNITY): Payer: Medicare Other

## 2018-03-10 ENCOUNTER — Inpatient Hospital Stay (HOSPITAL_COMMUNITY): Payer: Medicare Other | Admitting: Vascular Surgery

## 2018-03-10 ENCOUNTER — Encounter (HOSPITAL_COMMUNITY)
Admission: RE | Disposition: A | Discharge status: Home / Self Care | Payer: Self-pay | Source: Home / Self Care | Attending: Neurosurgery

## 2018-03-10 ENCOUNTER — Other Ambulatory Visit: Payer: Self-pay

## 2018-03-10 ENCOUNTER — Inpatient Hospital Stay (HOSPITAL_COMMUNITY): Payer: Medicare Other | Admitting: Emergency Medicine

## 2018-03-10 ENCOUNTER — Inpatient Hospital Stay (HOSPITAL_COMMUNITY)
Admission: RE | Admit: 2018-03-10 | Discharge: 2018-03-11 | DRG: 460 | Disposition: A | Discharge status: Home / Self Care | Payer: Medicare Other | Attending: Neurosurgery | Admitting: Neurosurgery

## 2018-03-10 ENCOUNTER — Encounter (HOSPITAL_COMMUNITY): Payer: Self-pay

## 2018-03-10 DIAGNOSIS — M48061 Spinal stenosis, lumbar region without neurogenic claudication: Secondary | ICD-10-CM | POA: Diagnosis present

## 2018-03-10 DIAGNOSIS — I130 Hypertensive heart and chronic kidney disease with heart failure and stage 1 through stage 4 chronic kidney disease, or unspecified chronic kidney disease: Secondary | ICD-10-CM | POA: Diagnosis present

## 2018-03-10 DIAGNOSIS — Z8042 Family history of malignant neoplasm of prostate: Secondary | ICD-10-CM | POA: Diagnosis not present

## 2018-03-10 DIAGNOSIS — N3941 Urge incontinence: Secondary | ICD-10-CM | POA: Diagnosis present

## 2018-03-10 DIAGNOSIS — Z8719 Personal history of other diseases of the digestive system: Secondary | ICD-10-CM | POA: Diagnosis not present

## 2018-03-10 DIAGNOSIS — I5032 Chronic diastolic (congestive) heart failure: Secondary | ICD-10-CM | POA: Diagnosis present

## 2018-03-10 DIAGNOSIS — M4316 Spondylolisthesis, lumbar region: Secondary | ICD-10-CM | POA: Diagnosis not present

## 2018-03-10 DIAGNOSIS — N401 Enlarged prostate with lower urinary tract symptoms: Secondary | ICD-10-CM | POA: Diagnosis present

## 2018-03-10 DIAGNOSIS — K760 Fatty (change of) liver, not elsewhere classified: Secondary | ICD-10-CM | POA: Diagnosis present

## 2018-03-10 DIAGNOSIS — Z79899 Other long term (current) drug therapy: Secondary | ICD-10-CM

## 2018-03-10 DIAGNOSIS — Z96611 Presence of right artificial shoulder joint: Secondary | ICD-10-CM | POA: Diagnosis present

## 2018-03-10 DIAGNOSIS — Z419 Encounter for procedure for purposes other than remedying health state, unspecified: Secondary | ICD-10-CM

## 2018-03-10 DIAGNOSIS — M4313 Spondylolisthesis, cervicothoracic region: Secondary | ICD-10-CM | POA: Diagnosis not present

## 2018-03-10 DIAGNOSIS — Z823 Family history of stroke: Secondary | ICD-10-CM

## 2018-03-10 DIAGNOSIS — M48062 Spinal stenosis, lumbar region with neurogenic claudication: Secondary | ICD-10-CM | POA: Diagnosis not present

## 2018-03-10 DIAGNOSIS — M1711 Unilateral primary osteoarthritis, right knee: Secondary | ICD-10-CM | POA: Diagnosis present

## 2018-03-10 DIAGNOSIS — M810 Age-related osteoporosis without current pathological fracture: Secondary | ICD-10-CM | POA: Diagnosis present

## 2018-03-10 DIAGNOSIS — F3131 Bipolar disorder, current episode depressed, mild: Secondary | ICD-10-CM | POA: Diagnosis present

## 2018-03-10 DIAGNOSIS — N184 Chronic kidney disease, stage 4 (severe): Secondary | ICD-10-CM | POA: Diagnosis present

## 2018-03-10 DIAGNOSIS — M4690 Unspecified inflammatory spondylopathy, site unspecified: Secondary | ICD-10-CM | POA: Diagnosis present

## 2018-03-10 DIAGNOSIS — Z981 Arthrodesis status: Secondary | ICD-10-CM | POA: Diagnosis not present

## 2018-03-10 SURGERY — POSTERIOR LUMBAR FUSION 1 LEVEL
Anesthesia: General | Site: Spine Lumbar

## 2018-03-10 MED ORDER — ACETAMINOPHEN 650 MG RE SUPP: 650.0000 mg | RECTAL | Status: DC | PRN

## 2018-03-10 MED ORDER — SENNA 8.6 MG PO TABS
1.0000 | ORAL_TABLET | Freq: Two times a day (BID) | ORAL | Status: DC
  Administered 2018-03-10 – 2018-03-11 (×2): 8.6 mg via ORAL
  Filled 2018-03-10 (×2): qty 1

## 2018-03-10 MED ORDER — SODIUM CHLORIDE 0.9 % IV SOLN
INTRAVENOUS | Status: DC
  Administered 2018-03-10 (×2): via INTRAVENOUS

## 2018-03-10 MED ORDER — SODIUM CHLORIDE 0.9 % IV SOLN
INTRAVENOUS | Status: DC | PRN
  Administered 2018-03-10: 500 mL

## 2018-03-10 MED ORDER — ACETAMINOPHEN 500 MG PO TABS
1000.0000 mg | ORAL_TABLET | Freq: Four times a day (QID) | ORAL | Status: DC
  Administered 2018-03-10 – 2018-03-11 (×2): 1000 mg via ORAL
  Filled 2018-03-10 (×3): qty 2

## 2018-03-10 MED ORDER — SUGAMMADEX SODIUM 200 MG/2ML IV SOLN
INTRAVENOUS | Status: DC | PRN
  Administered 2018-03-10: 200 mg via INTRAVENOUS

## 2018-03-10 MED ORDER — PROPOFOL 10 MG/ML IV BOLUS
INTRAVENOUS | Status: AC
  Filled 2018-03-10: qty 20

## 2018-03-10 MED ORDER — PHENOL 1.4 % MT LIQD: 1.0000 | OROMUCOSAL | Status: DC | PRN

## 2018-03-10 MED ORDER — ONDANSETRON HCL 4 MG/2ML IJ SOLN: 4.0000 mg | Freq: Four times a day (QID) | INTRAMUSCULAR | Status: DC | PRN

## 2018-03-10 MED ORDER — ONDANSETRON HCL 4 MG/2ML IJ SOLN
INTRAMUSCULAR | Status: DC | PRN
  Administered 2018-03-10: 4 mg via INTRAVENOUS

## 2018-03-10 MED ORDER — THROMBIN 5000 UNITS EX SOLR
CUTANEOUS | Status: AC
  Filled 2018-03-10: qty 5000

## 2018-03-10 MED ORDER — SODIUM CHLORIDE 0.9% FLUSH: 3.0000 mL | Freq: Two times a day (BID) | INTRAVENOUS | Status: DC

## 2018-03-10 MED ORDER — FENTANYL CITRATE (PF) 250 MCG/5ML IJ SOLN
INTRAMUSCULAR | Status: AC
  Filled 2018-03-10: qty 5

## 2018-03-10 MED ORDER — METHOCARBAMOL 1000 MG/10ML IJ SOLN
500.0000 mg | Freq: Four times a day (QID) | INTRAVENOUS | Status: DC | PRN
  Filled 2018-03-10: qty 5

## 2018-03-10 MED ORDER — HYDROCODONE-ACETAMINOPHEN 5-325 MG PO TABS
ORAL_TABLET | ORAL | Status: AC
  Filled 2018-03-10: qty 1

## 2018-03-10 MED ORDER — LIDOCAINE 2% (20 MG/ML) 5 ML SYRINGE
INTRAMUSCULAR | Status: AC
  Filled 2018-03-10: qty 5

## 2018-03-10 MED ORDER — THROMBIN 20000 UNITS EX SOLR
CUTANEOUS | Status: AC
  Filled 2018-03-10: qty 20000

## 2018-03-10 MED ORDER — SODIUM CHLORIDE 0.9 % IV SOLN
INTRAVENOUS | Status: DC | PRN
  Administered 2018-03-10: 50 ug/min via INTRAVENOUS

## 2018-03-10 MED ORDER — CHLORHEXIDINE GLUCONATE CLOTH 2 % EX PADS: 6.0000 | MEDICATED_PAD | Freq: Once | CUTANEOUS | Status: DC

## 2018-03-10 MED ORDER — CEFAZOLIN SODIUM-DEXTROSE 2-4 GM/100ML-% IV SOLN
2.0000 g | INTRAVENOUS | Status: AC
  Administered 2018-03-10: 2 g via INTRAVENOUS
  Filled 2018-03-10: qty 100

## 2018-03-10 MED ORDER — PHENYLEPHRINE 40 MCG/ML (10ML) SYRINGE FOR IV PUSH (FOR BLOOD PRESSURE SUPPORT)
PREFILLED_SYRINGE | INTRAVENOUS | Status: AC
  Filled 2018-03-10: qty 10

## 2018-03-10 MED ORDER — ONDANSETRON HCL 4 MG/2ML IJ SOLN
INTRAMUSCULAR | Status: AC
  Filled 2018-03-10: qty 2

## 2018-03-10 MED ORDER — BISACODYL 10 MG RE SUPP: 10.0000 mg | Freq: Every day | RECTAL | Status: DC | PRN

## 2018-03-10 MED ORDER — SODIUM CHLORIDE 0.9 % IV SOLN: 250.0000 mL | INTRAVENOUS | Status: DC

## 2018-03-10 MED ORDER — ROCURONIUM BROMIDE 50 MG/5ML IV SOSY
PREFILLED_SYRINGE | INTRAVENOUS | Status: AC
  Filled 2018-03-10: qty 10

## 2018-03-10 MED ORDER — PROPOFOL 10 MG/ML IV BOLUS
INTRAVENOUS | Status: DC | PRN
  Administered 2018-03-10: 130 mg via INTRAVENOUS

## 2018-03-10 MED ORDER — BUPIVACAINE HCL (PF) 0.5 % IJ SOLN
INTRAMUSCULAR | Status: DC | PRN
  Administered 2018-03-10: 5 mL

## 2018-03-10 MED ORDER — MENTHOL 3 MG MT LOZG: 1.0000 | LOZENGE | OROMUCOSAL | Status: DC | PRN

## 2018-03-10 MED ORDER — LIDOCAINE-EPINEPHRINE 1 %-1:100000 IJ SOLN
INTRAMUSCULAR | Status: AC
  Filled 2018-03-10: qty 1

## 2018-03-10 MED ORDER — FLEET ENEMA 7-19 GM/118ML RE ENEM: 1.0000 | ENEMA | Freq: Once | RECTAL | Status: DC | PRN

## 2018-03-10 MED ORDER — SODIUM CHLORIDE 0.9% FLUSH: 3.0000 mL | INTRAVENOUS | Status: DC | PRN

## 2018-03-10 MED ORDER — DOCUSATE SODIUM 100 MG PO CAPS
100.0000 mg | ORAL_CAPSULE | Freq: Two times a day (BID) | ORAL | Status: DC
  Administered 2018-03-10 – 2018-03-11 (×2): 100 mg via ORAL
  Filled 2018-03-10 (×2): qty 1

## 2018-03-10 MED ORDER — LIDOCAINE 2% (20 MG/ML) 5 ML SYRINGE
INTRAMUSCULAR | Status: DC | PRN
  Administered 2018-03-10: 100 mg via INTRAVENOUS

## 2018-03-10 MED ORDER — ALBUMIN HUMAN 5 % IV SOLN
INTRAVENOUS | Status: DC | PRN
  Administered 2018-03-10 (×2): via INTRAVENOUS

## 2018-03-10 MED ORDER — 0.9 % SODIUM CHLORIDE (POUR BTL) OPTIME
TOPICAL | Status: DC | PRN
  Administered 2018-03-10: 1000 mL

## 2018-03-10 MED ORDER — DEXAMETHASONE SODIUM PHOSPHATE 10 MG/ML IJ SOLN
INTRAMUSCULAR | Status: AC
  Filled 2018-03-10: qty 1

## 2018-03-10 MED ORDER — HYDROCODONE-ACETAMINOPHEN 5-325 MG PO TABS
1.0000 | ORAL_TABLET | ORAL | Status: DC | PRN
  Administered 2018-03-10 – 2018-03-11 (×2): 1 via ORAL
  Filled 2018-03-10: qty 1

## 2018-03-10 MED ORDER — PHENYLEPHRINE 40 MCG/ML (10ML) SYRINGE FOR IV PUSH (FOR BLOOD PRESSURE SUPPORT)
PREFILLED_SYRINGE | INTRAVENOUS | Status: DC | PRN
  Administered 2018-03-10: 160 ug via INTRAVENOUS
  Administered 2018-03-10 (×2): 120 ug via INTRAVENOUS

## 2018-03-10 MED ORDER — LIDOCAINE-EPINEPHRINE 1 %-1:100000 IJ SOLN
INTRAMUSCULAR | Status: DC | PRN
  Administered 2018-03-10: 5 mL

## 2018-03-10 MED ORDER — ROCURONIUM BROMIDE 10 MG/ML (PF) SYRINGE
PREFILLED_SYRINGE | INTRAVENOUS | Status: DC | PRN
  Administered 2018-03-10: 30 mg via INTRAVENOUS
  Administered 2018-03-10: 50 mg via INTRAVENOUS
  Administered 2018-03-10: 20 mg via INTRAVENOUS

## 2018-03-10 MED ORDER — ACETAMINOPHEN 325 MG PO TABS: 650.0000 mg | ORAL_TABLET | ORAL | Status: DC | PRN

## 2018-03-10 MED ORDER — ZOLPIDEM TARTRATE 5 MG PO TABS: 5.0000 mg | ORAL_TABLET | Freq: Every evening | ORAL | Status: DC | PRN

## 2018-03-10 MED ORDER — HYDROMORPHONE HCL 1 MG/ML IJ SOLN: 0.5000 mg | INTRAMUSCULAR | Status: DC | PRN

## 2018-03-10 MED ORDER — THROMBIN 5000 UNITS EX SOLR
OROMUCOSAL | Status: DC | PRN
  Administered 2018-03-10 (×2): 5 mL via TOPICAL

## 2018-03-10 MED ORDER — SENNOSIDES-DOCUSATE SODIUM 8.6-50 MG PO TABS: 1.0000 | ORAL_TABLET | Freq: Every evening | ORAL | Status: DC | PRN

## 2018-03-10 MED ORDER — SODIUM CHLORIDE 0.9 % IV SOLN: INTRAVENOUS | Status: DC

## 2018-03-10 MED ORDER — GABAPENTIN 300 MG PO CAPS
300.0000 mg | ORAL_CAPSULE | Freq: Three times a day (TID) | ORAL | Status: DC
  Administered 2018-03-10 (×2): 300 mg via ORAL
  Filled 2018-03-10 (×3): qty 1

## 2018-03-10 MED ORDER — BUPIVACAINE HCL (PF) 0.5 % IJ SOLN
INTRAMUSCULAR | Status: AC
  Filled 2018-03-10: qty 30

## 2018-03-10 MED ORDER — ONDANSETRON HCL 4 MG PO TABS: 4.0000 mg | ORAL_TABLET | Freq: Four times a day (QID) | ORAL | Status: DC | PRN

## 2018-03-10 MED ORDER — DEXAMETHASONE SODIUM PHOSPHATE 10 MG/ML IJ SOLN
INTRAMUSCULAR | Status: DC | PRN
  Administered 2018-03-10: 10 mg via INTRAVENOUS

## 2018-03-10 MED ORDER — METHOCARBAMOL 500 MG PO TABS
500.0000 mg | ORAL_TABLET | Freq: Four times a day (QID) | ORAL | Status: DC | PRN
  Administered 2018-03-10: 500 mg via ORAL
  Filled 2018-03-10 (×2): qty 1

## 2018-03-10 MED ORDER — FENTANYL CITRATE (PF) 250 MCG/5ML IJ SOLN
INTRAMUSCULAR | Status: DC | PRN
  Administered 2018-03-10: 100 ug via INTRAVENOUS
  Administered 2018-03-10 (×2): 50 ug via INTRAVENOUS

## 2018-03-10 MED ORDER — CEFAZOLIN SODIUM-DEXTROSE 2-4 GM/100ML-% IV SOLN
2.0000 g | Freq: Three times a day (TID) | INTRAVENOUS | Status: AC
  Administered 2018-03-10: 2 g via INTRAVENOUS
  Filled 2018-03-10: qty 100

## 2018-03-10 MED ORDER — OXYCODONE HCL 5 MG PO TABS
5.0000 mg | ORAL_TABLET | ORAL | Status: DC | PRN
  Administered 2018-03-10: 10 mg via ORAL
  Filled 2018-03-10: qty 2

## 2018-03-10 MED ORDER — FENTANYL CITRATE (PF) 100 MCG/2ML IJ SOLN: 25.0000 ug | INTRAMUSCULAR | Status: DC | PRN

## 2018-03-10 SURGICAL SUPPLY — 78 items
ADH SKN CLS APL DERMABOND .7 (GAUZE/BANDAGES/DRESSINGS) ×1
APL SKNCLS STERI-STRIP NONHPOA (GAUZE/BANDAGES/DRESSINGS)
BAG DECANTER FOR FLEXI CONT (MISCELLANEOUS) ×2 IMPLANT
BASKET BONE COLLECTION (BASKET) ×2 IMPLANT
BENZOIN TINCTURE PRP APPL 2/3 (GAUZE/BANDAGES/DRESSINGS) IMPLANT
BIT DRILL 3.5 POWEREASE (BIT) ×1 IMPLANT
BLADE CLIPPER SURG (BLADE) ×1 IMPLANT
BLADE SURG 11 STRL SS (BLADE) ×2 IMPLANT
BUR MATCHSTICK NEURO 3.0 LAGG (BURR) ×2 IMPLANT
BUR PRECISION FLUTE 5.0 (BURR) ×2 IMPLANT
CANISTER SUCT 3000ML PPV (MISCELLANEOUS) ×2 IMPLANT
CARTRIDGE OIL MAESTRO DRILL (MISCELLANEOUS) ×1 IMPLANT
CONT SPEC 4OZ CLIKSEAL STRL BL (MISCELLANEOUS) ×2 IMPLANT
COVER BACK TABLE 60X90IN (DRAPES) ×2 IMPLANT
COVER WAND RF STERILE (DRAPES) ×2 IMPLANT
DECANTER SPIKE VIAL GLASS SM (MISCELLANEOUS) ×2 IMPLANT
DERMABOND ADVANCED (GAUZE/BANDAGES/DRESSINGS) ×1
DERMABOND ADVANCED .7 DNX12 (GAUZE/BANDAGES/DRESSINGS) ×1 IMPLANT
DEVICE INTERBODY ELEVATE 23X7 (Cage) ×2 IMPLANT
DIFFUSER DRILL AIR PNEUMATIC (MISCELLANEOUS) ×2 IMPLANT
DRAPE C-ARM 42X72 X-RAY (DRAPES) ×2 IMPLANT
DRAPE C-ARMOR (DRAPES) ×2 IMPLANT
DRAPE LAPAROTOMY 100X72X124 (DRAPES) ×2 IMPLANT
DRAPE SHEET LG 3/4 BI-LAMINATE (DRAPES) ×1 IMPLANT
DRAPE SURG 17X23 STRL (DRAPES) ×2 IMPLANT
DRSG OPSITE POSTOP 4X6 (GAUZE/BANDAGES/DRESSINGS) ×1 IMPLANT
DURAPREP 26ML APPLICATOR (WOUND CARE) ×2 IMPLANT
ELECT REM PT RETURN 9FT ADLT (ELECTROSURGICAL) ×2
ELECTRODE REM PT RTRN 9FT ADLT (ELECTROSURGICAL) ×1 IMPLANT
GAUZE 4X4 16PLY RFD (DISPOSABLE) IMPLANT
GAUZE SPONGE 4X4 12PLY STRL (GAUZE/BANDAGES/DRESSINGS) IMPLANT
GLOVE BIO SURGEON STRL SZ 6.5 (GLOVE) ×4 IMPLANT
GLOVE BIO SURGEON STRL SZ7.5 (GLOVE) ×2 IMPLANT
GLOVE BIOGEL PI IND STRL 6.5 (GLOVE) IMPLANT
GLOVE BIOGEL PI IND STRL 7.0 (GLOVE) IMPLANT
GLOVE BIOGEL PI IND STRL 7.5 (GLOVE) ×2 IMPLANT
GLOVE BIOGEL PI INDICATOR 6.5 (GLOVE) ×2
GLOVE BIOGEL PI INDICATOR 7.0 (GLOVE) ×3
GLOVE BIOGEL PI INDICATOR 7.5 (GLOVE) ×4
GLOVE ECLIPSE 7.0 STRL STRAW (GLOVE) ×4 IMPLANT
GLOVE EXAM NITRILE XL STR (GLOVE) IMPLANT
GLOVE SURG SS PI 6.0 STRL IVOR (GLOVE) ×2 IMPLANT
GOWN STRL REUS W/ TWL LRG LVL3 (GOWN DISPOSABLE) ×4 IMPLANT
GOWN STRL REUS W/ TWL XL LVL3 (GOWN DISPOSABLE) IMPLANT
GOWN STRL REUS W/TWL 2XL LVL3 (GOWN DISPOSABLE) IMPLANT
GOWN STRL REUS W/TWL LRG LVL3 (GOWN DISPOSABLE) ×14
GOWN STRL REUS W/TWL XL LVL3 (GOWN DISPOSABLE)
HEMOSTAT POWDER KIT SURGIFOAM (HEMOSTASIS) ×3 IMPLANT
KIT BASIN OR (CUSTOM PROCEDURE TRAY) ×2 IMPLANT
KIT INFUSE XX SMALL 0.7CC (Orthopedic Implant) ×1 IMPLANT
KIT POSITION SURG JACKSON T1 (MISCELLANEOUS) ×2 IMPLANT
KIT TURNOVER KIT B (KITS) ×2 IMPLANT
MILL MEDIUM DISP (BLADE) ×2 IMPLANT
NDL HYPO 18GX1.5 BLUNT FILL (NEEDLE) IMPLANT
NDL SPNL 18GX3.5 QUINCKE PK (NEEDLE) IMPLANT
NEEDLE HYPO 18GX1.5 BLUNT FILL (NEEDLE) IMPLANT
NEEDLE HYPO 22GX1.5 SAFETY (NEEDLE) ×2 IMPLANT
NEEDLE SPNL 18GX3.5 QUINCKE PK (NEEDLE) ×2 IMPLANT
NS IRRIG 1000ML POUR BTL (IV SOLUTION) ×2 IMPLANT
OIL CARTRIDGE MAESTRO DRILL (MISCELLANEOUS) ×2
PACK LAMINECTOMY NEURO (CUSTOM PROCEDURE TRAY) ×2 IMPLANT
PAD ARMBOARD 7.5X6 YLW CONV (MISCELLANEOUS) ×7 IMPLANT
ROD COBALT 47.5X35 (Rod) ×2 IMPLANT
SCREW 5.5X35MM (Screw) ×8 IMPLANT
SCREW BN 35X5.5XMA NS SPNE (Screw) IMPLANT
SCREW SET SOLERA (Screw) ×8 IMPLANT
SCREW SET SOLERA TI (Screw) IMPLANT
SPONGE LAP 4X18 RFD (DISPOSABLE) IMPLANT
SPONGE SURGIFOAM ABS GEL 100 (HEMOSTASIS) IMPLANT
STRIP CLOSURE SKIN 1/2X4 (GAUZE/BANDAGES/DRESSINGS) IMPLANT
SUT VIC AB 0 CT1 18XCR BRD8 (SUTURE) ×1 IMPLANT
SUT VIC AB 0 CT1 8-18 (SUTURE) ×2
SUT VICRYL 3-0 RB1 18 ABS (SUTURE) ×2 IMPLANT
SYR 3ML LL SCALE MARK (SYRINGE) ×4 IMPLANT
TOWEL GREEN STERILE (TOWEL DISPOSABLE) ×2 IMPLANT
TOWEL GREEN STERILE FF (TOWEL DISPOSABLE) ×2 IMPLANT
TRAY FOLEY MTR SLVR 16FR STAT (SET/KITS/TRAYS/PACK) ×2 IMPLANT
WATER STERILE IRR 1000ML POUR (IV SOLUTION) ×2 IMPLANT

## 2018-03-10 NOTE — Transfer of Care (Signed)
Immediate Anesthesia Transfer of Care Note  Patient: Quinto Tippy  Procedure(s) Performed: POSTERIOR LUMBAR INTERBODY FUSION LUMBAR FOUR- LUMBAR FIVE, BIOMECHANICAL DEVICE, INTERBODY ARTHRODESIS, POSTERIOR NON-SEGMENTAL INSTRUMENTATION (N/A Spine Lumbar)  Patient Location: PACU  Anesthesia Type:General  Level of Consciousness: drowsy  Airway & Oxygen Therapy: Patient Spontanous Breathing and Patient connected to face mask oxygen  Post-op Assessment: Report given to RN, Post -op Vital signs reviewed and stable and Patient moving all extremities X 4  Post vital signs: Reviewed and stable  Last Vitals:  Vitals Value Taken Time  BP 120/60 03/10/2018  4:12 PM  Temp    Pulse 74 03/10/2018  4:14 PM  Resp 17 03/10/2018  4:14 PM  SpO2 100 % 03/10/2018  4:14 PM  Vitals shown include unvalidated device data.  Last Pain:  Vitals:   03/10/18 1128  TempSrc:   PainSc: 0-No pain         Complications: No apparent anesthesia complications

## 2018-03-10 NOTE — Anesthesia Postprocedure Evaluation (Signed)
Anesthesia Post Note  Patient: [name]  Procedure(s) Performed: POSTERIOR LUMBAR INTERBODY FUSION LUMBAR FOUR- LUMBAR FIVE, BIOMECHANICAL DEVICE, INTERBODY ARTHRODESIS, POSTERIOR NON-SEGMENTAL INSTRUMENTATION (N/A Spine Lumbar)     Patient location during evaluation: PACU Anesthesia Type: General Level of consciousness: awake Pain management: pain level controlled Vital Signs Assessment: post-procedure vital signs reviewed and stable Respiratory status: spontaneous breathing Cardiovascular status: stable Anesthetic complications: no    Last Vitals:  Vitals:   03/10/18 1714 03/10/18 1740  BP: 119/64 133/71  Pulse: 68 65  Resp: 18 16  Temp:  (!) 36.4 C  SpO2: 95% 97%    Last Pain:  Vitals:   03/10/18 1804  TempSrc:   PainSc: 6                  Chundra Sauerwein

## 2018-03-10 NOTE — Op Note (Signed)
NEUROSURGERY OPERATIVE NOTE   PREOP DIAGNOSIS:  1. Lumbar stenosis without neurogenic claudication 2. Spondylolisthesis, L4-5  POSTOP DIAGNOSIS: Same  PROCEDURE: 1. L4 laminectomy with facetectomy for decompression of exiting nerve roots, more than would be required for placement of interbody graft 2. Placement of anterior interbody device - Medtronic expandable 20mm lordotic cage x2 3. Posterior non-segmental instrumentation using cortical pedicle screws at L4 - L5 - Medtronic Solera 5.5 x 34mm x 4 4. Interbody arthrodesis, L4-5 5. Use of locally harvested bone autograft 6. Use of non-structural bone allograft - rhBMP-2  SURGEON: Dr. Consuella Lose, MD  ASSISTANT: Ferne Reus, PA-C  ANESTHESIA: General Endotracheal  EBL: 500cc  SPECIMENS: None  DRAINS: None  COMPLICATIONS: None immediate  CONDITION: Hemodynamically stable to PACU  HISTORY: [name] is a 77 y.o. male who has been followed in the outpatient clinic with back pain related to severe stenosis with spondylolisthesis at L4-5. He attempted multiple conservative treatments and ultimately elected to proceed with surgical decompression and fusion. Risks and benefits were reviewed and consent was obtained.  PROCEDURE IN DETAIL: After informed consent was obtained and witnessed, the patient was brought to the operating room. After induction of general anesthesia, the patient was positioned on the operative table in the prone position. All pressure points were meticulously padded. Incision was then marked out and prepped and draped in the usual sterile fashion.  After timeout was conducted, skin was infiltrated with local anesthetic. Skin incision was then made sharply and Bovie electrocautery was used to dissect the subcutaneous tissue until the lumbodorsal fascia was identified and incised. The muscle was then elevated in the subperiosteal plane and the L4 lamina and L4-5 facet complexes were identified.  Self-retaining retractors were then placed  At this point attention was turned to decompression. Complete L4 laminectomy with complete facetectomy was completed using a combination of Kerrison rongeurs and a high-speed drill.  There was significant facet arthropathy with partial calcification of the ligamentum flavum causing significant stenosis at the L4-5 interspace.  This was completely decompressed by removal of the facet joint and ligamentum flavum.  I was able to identify bilateral L5 nerve roots.  I was easily able to pass a ball-tipped dissector through the ventral epidural space and out the foramen indicating good decompression.  Disc space was then identified, incised, and using a combination of shavers, curettes and rongeurs, complete discectomy was completed. Endplates were prepared, and bone harvested during decompression was mixed with BMP and packed into the interspace. A 7 mm expandable cage was tapped into place bilaterally.  Position was confirmed with lateral fluoroscopy.  The cages were then expanded maximally to 11 mm bilaterally.  Good position was confirmed with fluoroscopy.  At this point using a combination of lateral fluoroscopic guidance in standard anatomic landmarks, entry points for bilateral L4 and L5 cortical pedicle screws were identified.  Pilot holes were then created and tapped.  Screws were then placed bilaterally at L4 and L5 measuring 5.5 x 35 mm.  Pre-bent 35 mm lordotic rod was then placed and secured with set screws.  These were final tightened.  Final AP and lateral fluoroscopic images confirmed good location of the implanted hardware and good lumbar alignment.  The wound was then irrigated with copious amounts of antibiotic saline, then closed in standard fashion using a combination of interrupted 0 and 3-0 Vicryl stitches in the muscular, fascial, and subcutaneous layers. Skin was then closed using standard Dermabond. Sterile dressing was then applied. The patient  was  then transferred to the stretcher, extubated, and taken to the postanesthesia care unit in stable hemodynamic condition.  At the end of the case all sponge, needle, cottonoid, and instrument counts were correct.

## 2018-03-10 NOTE — Anesthesia Procedure Notes (Signed)
Procedure Name: Intubation Date/Time: 03/10/2018 1:01 PM Performed by: Colin Benton, CRNA Pre-anesthesia Checklist: Emergency Drugs available, Patient identified, Suction available and Patient being monitored Patient Re-evaluated:Patient Re-evaluated prior to induction Oxygen Delivery Method: Circle system utilized Preoxygenation: Pre-oxygenation with 100% oxygen Induction Type: IV induction Ventilation: Mask ventilation without difficulty Laryngoscope Size: Miller and 2 Grade View: Grade I Tube type: Oral Tube size: 7.5 mm Number of attempts: 1 Airway Equipment and Method: Stylet Placement Confirmation: ETT inserted through vocal cords under direct vision,  positive ETCO2 and breath sounds checked- equal and bilateral Secured at: 22 cm Tube secured with: Tape Dental Injury: Teeth and Oropharynx as per pre-operative assessment

## 2018-03-11 DIAGNOSIS — M48061 Spinal stenosis, lumbar region without neurogenic claudication: Secondary | ICD-10-CM | POA: Diagnosis present

## 2018-03-11 LAB — BASIC METABOLIC PANEL
Anion gap: 11 (ref 5–15)
BUN: 41 mg/dL — ABNORMAL HIGH (ref 8–23)
CO2: 22 mmol/L (ref 22–32)
Calcium: 8.8 mg/dL — ABNORMAL LOW (ref 8.9–10.3)
Chloride: 108 mmol/L (ref 98–111)
Creatinine, Ser: 2.62 mg/dL — ABNORMAL HIGH (ref 0.61–1.24)
GFR calc Af Amer: 26 mL/min — ABNORMAL LOW (ref >60)
GFR calc non Af Amer: 22 mL/min — ABNORMAL LOW (ref >60)
Glucose, Bld: 129 mg/dL — ABNORMAL HIGH (ref 70–99)
Potassium: 5 mmol/L (ref 3.5–5.1)
Sodium: 141 mmol/L (ref 135–145)

## 2018-03-11 LAB — CBC
HCT: 33.2 % — ABNORMAL LOW (ref 39.0–52.0)
Hemoglobin: 10.5 g/dL — ABNORMAL LOW (ref 13.0–17.0)
MCH: 31.8 pg (ref 26.0–34.0)
MCHC: 31.6 g/dL (ref 30.0–36.0)
MCV: 100.6 fL — ABNORMAL HIGH (ref 80.0–100.0)
Platelets: 196 10*3/uL (ref 150–400)
RBC: 3.3 MIL/uL — ABNORMAL LOW (ref 4.22–5.81)
RDW: 12.5 % (ref 11.5–15.5)
WBC: 14.2 10*3/uL — ABNORMAL HIGH (ref 4.0–10.5)
nRBC: 0 % (ref 0.0–0.2)

## 2018-03-11 LAB — PROTIME-INR
INR: 1.02
Prothrombin Time: 13.3 seconds (ref 11.4–15.2)

## 2018-03-11 LAB — APTT: aPTT: 31 seconds (ref 24–36)

## 2018-03-11 MED ORDER — OXYCODONE HCL 5 MG PO TABS: 5.0000 mg | ORAL_TABLET | Freq: Four times a day (QID) | ORAL | 0 refills | Status: DC | PRN

## 2018-03-11 MED ORDER — METHOCARBAMOL 500 MG PO TABS: 500.0000 mg | ORAL_TABLET | Freq: Four times a day (QID) | ORAL | 0 refills | Status: DC | PRN

## 2018-03-11 NOTE — Discharge Instructions (Signed)
Wound Care Keep incision covered and dry for two days.   Do not put any creams, lotions, or ointments on incision. Leave steri-strips on back.  They will fall off by themselves. Activity Walk each and every day, increasing distance each day. No lifting greater than 5 lbs.  Avoid bending, lifting and twisting. No driving for 2 weeks; may ride as a passenger locally. If provided with back brace, wear when out of bed.  It is not necessary to wear brace in bed. Diet Resume your normal diet.  Return to Work Will be discussed at you follow up appointment. Call Your Doctor If Any of These Occur Redness, drainage, or swelling at the wound.  Temperature greater than 101 degrees. Severe pain not relieved by pain medication. Incision starts to come apart. Follow Up Appt Call today for appointment in 1-2 weeks ([phone]) or for problems.  If you have any hardware placed in your spine, you will need an x-ray before your appointment.  

## 2018-03-11 NOTE — Evaluation (Signed)
Physical Therapy Evaluation & Discharge Patient Details Name: [name] MRN: [id number] DOB: Nov 19, 1940 Today's Date: 03/11/2018   History of Present Illness  Pt is a 77 y.o. male s/p PLIF L4-5. PMH includes CHF, CKD, HTN, arthritis, depression.  Clinical Impression  Patient evaluated by Physical Therapy with no further acute PT needs identified. PTA, pt indep; will have 24/7 support from girlfriend. Educ on precautions, brace application, fall risk reduction and importance of mobility. All education has been completed and the patient has no further questions. Recommend follow-up with HHPT services. Acute PT is signing off. Thank you for this referral.    Follow Up Recommendations Home health PT;Supervision for mobility/OOB    Equipment Recommendations  None recommended by PT    Recommendations for Other Services OT consult     Precautions / Restrictions Precautions Precautions: Fall;Back Precaution Booklet Issued: Yes (comment) Precaution Comments: Verbally reviewed precautions. Able to recall 2/3 at end of session Required Braces or Orthoses: Spinal Brace Spinal Brace: Lumbar corset;Applied in sitting position Restrictions Weight Bearing Restrictions: No      Mobility  Bed Mobility Overal bed mobility: Modified Independent             General bed mobility comments: Educ on log roll technique, able to perform for return to supine  Transfers Overall transfer level: Modified independent Equipment used: Rolling walker (2 wheeled)                Ambulation/Gait Ambulation/Gait assistance: Supervision Gait Distance (Feet): 400 Feet Assistive device: Rolling walker (2 wheeled) Gait Pattern/deviations: Step-through pattern;Decreased stride length;Trunk flexed Gait velocity: Decreased Gait velocity interpretation: <1.8 ft/sec, indicate of risk for recurrent falls General Gait Details: Slow, guarded, forward flexed ambulation with RW and supervision for  safety. Pt able to slightly increase gait speed when cued; frequent cues for upright posture  Stairs Stairs: Yes Stairs assistance: Min guard Stair Management: One rail Right;Sideways;Step to pattern Number of Stairs: 10 General stair comments: Ascend/descended 10 steps with BUE support on R-side rail; min guard for safety  Wheelchair Mobility    Modified Rankin (Stroke Patients Only)       Balance Overall balance assessment: Needs assistance   Sitting balance-Leahy Scale: Fair       Standing balance-Leahy Scale: Poor                               Pertinent Vitals/Pain Pain Assessment: Faces Faces Pain Scale: Hurts a little bit Pain Location: BAck Pain Descriptors / Indicators: Sore;Discomfort Pain Intervention(s): Monitored during session;Limited activity within patient's tolerance    Home Living Family/patient expects to be discharged to:: Private residence Living Arrangements: Spouse/significant other(Girlfriend) Available Help at Discharge: Family;Available 24 hours/day Type of Home: House Home Access: Stairs to enter Entrance Stairs-Rails: Right Entrance Stairs-Number of Steps: 4 Home Layout: Two level Home Equipment: Environmental consultant - 2 wheels Additional Comments: RW on top floor and second RW on bottom floor    Prior Function Level of Independence: Independent               Hand Dominance        Extremity/Trunk Assessment   Upper Extremity Assessment Upper Extremity Assessment: Overall WFL for tasks assessed    Lower Extremity Assessment Lower Extremity Assessment: Overall WFL for tasks assessed       Communication   Communication: No difficulties  Cognition Arousal/Alertness: Awake/alert Behavior During Therapy: Flat affect Overall Cognitive Status: No family/caregiver present to  determine baseline cognitive functioning Area of Impairment: Problem solving                             Problem Solving: Slow processing         General Comments      Exercises     Assessment/Plan    PT Assessment Patient needs continued PT services  PT Problem List Decreased strength;Decreased activity tolerance;Decreased balance;Decreased mobility;Decreased range of motion;Decreased knowledge of use of DME;Decreased knowledge of precautions;Pain       PT Treatment Interventions DME instruction;Gait training;Stair training;Functional mobility training;Therapeutic activities;Therapeutic exercise;Balance training;Patient/family education    PT Goals (Current goals can be found in the Care Plan section)  Acute Rehab PT Goals PT Goal Formulation: All assessment and education complete, DC therapy    Frequency Min 5X/week   Barriers to discharge        Co-evaluation               AM-PAC PT "6 Clicks" Mobility  Outcome Measure Help needed turning from your back to your side while in a flat bed without using bedrails?: None Help needed moving from lying on your back to sitting on the side of a flat bed without using bedrails?: None Help needed moving to and from a bed to a chair (including a wheelchair)?: None Help needed standing up from a chair using your arms (e.g., wheelchair or bedside chair)?: A Little Help needed to walk in hospital room?: A Little Help needed climbing 3-5 steps with a railing? : A Little 6 Click Score: 21    End of Session Equipment Utilized During Treatment: Gait belt;Back brace Activity Tolerance: Patient tolerated treatment well Patient left: in bed;with call bell/phone within reach Nurse Communication: Mobility status PT Visit Diagnosis: Other abnormalities of gait and mobility (R26.89)    Time: 6283-6629 PT Time Calculation (min) (ACUTE ONLY): 28 min   Charges:   PT Evaluation $PT Eval Moderate Complexity: 1 Mod PT Treatments $Gait Training: 8-22 mins      Mabeline Caras, PT, DPT Acute Rehabilitation Services  Pager [phone] Office [place] 03/11/2018, 9:08 AM

## 2018-03-11 NOTE — Evaluation (Signed)
Occupational Therapy Evaluation Patient Details Name: [name] MRN: [id number] DOB: Mar 21, 1941 Today's Date: 03/11/2018    History of Present Illness Pt is a 77 y.o. male s/p PLIF L4-5. PMH includes CHF, CKD, HTN, arthritis, depression.   Clinical Impression   Patient is s/p  PLIF L4-5 surgery resulting in the deficits listed below (see OT Problem List). Patient prior level self reported indpendent in all ADLS and  IADLS. Patient requires supervision to complete UE dressing, min assistance with LE dressing and supervision with toileting due to back precautions. It was recommended to have shower seat in walk in shower. Patient was educated about back precautions, brace and needed adaptive equipment in the home. The patient reported his girlfriend will assist as needed. The patient's goal is to return home.      Follow Up Recommendations  No OT follow up    Equipment Recommendations  Tub/shower seat(reacher, sock aide, toilet aide)    Recommendations for Other Services       Precautions / Restrictions Precautions Precautions: Fall;Back Precaution Booklet Issued: Yes (comment) Precaution Comments: verablly reviewed at beginign of session Required Braces or Orthoses: Spinal Brace Spinal Brace: Lumbar corset;Applied in sitting position Restrictions Weight Bearing Restrictions: No      Mobility Bed Mobility Overal bed mobility: Modified Independent             General bed mobility comments: sequencing   Transfers Overall transfer level: Modified independent Equipment used: Rolling walker (2 wheeled)             General transfer comment: needs increase time    Balance Overall balance assessment: Needs assistance Sitting-balance support: Feet supported Sitting balance-Leahy Scale: Fair     Standing balance support: Bilateral upper extremity supported Standing balance-Leahy Scale: Poor                             ADL either performed or  assessed with clinical judgement   ADL Overall ADL's : Needs assistance/impaired Eating/Feeding: Independent;Sitting   Grooming: Wash/dry face;Wash/dry hands;Supervision/safety;Standing;Cueing for sequencing   Upper Body Bathing: Supervision/ safety;Sitting   Lower Body Bathing: Minimal assistance;Adhering to back precautions   Upper Body Dressing : Supervision/safety;Sitting   Lower Body Dressing: Minimal assistance;Adhering to back precautions;Sit to/from stand   Toilet Transfer: Supervision/safety   Toileting- Water quality scientist and Hygiene: Supervision/safety;Cueing for back precautions;Sit to/from stand   Tub/ Shower Transfer: Min guard;Adhering to back precautions   Functional mobility during ADLs: Supervision/safety;Rolling walker       Vision Baseline Vision/History: Wears glasses Wears Glasses: At all times Patient Visual Report: No change from baseline       Perception     Praxis Praxis Praxis tested?: Not tested    Pertinent Vitals/Pain Pain Assessment: 0-10 Pain Score: 5  Faces Pain Scale: Hurts a little bit Pain Location: back Pain Descriptors / Indicators: Aching Pain Intervention(s): Monitored during session     Hand Dominance Left   Extremity/Trunk Assessment Upper Extremity Assessment Upper Extremity Assessment: Overall WFL for tasks assessed   Lower Extremity Assessment Lower Extremity Assessment: Overall WFL for tasks assessed       Communication Communication Communication: No difficulties   Cognition Arousal/Alertness: Suspect due to medications(as session went on became awake ) Behavior During Therapy: Flat affect Overall Cognitive Status: No family/caregiver present to determine baseline cognitive functioning Area of Impairment: Attention;Awareness  Problem Solving: Slow processing     General Comments  took increase time and repeated instructions on sequncing     Exercises      Shoulder Instructions      Home Living Family/patient expects to be discharged to:: Private residence Living Arrangements: Spouse/significant other Available Help at Discharge: Family;Available 24 hours/day Type of Home: House Home Access: Stairs to enter [employer] of Steps: 4 Entrance Stairs-Rails: Right Home Layout: Two level Alternate Level Stairs-Number of Steps: 14 Alternate Level Stairs-Rails: Right Bathroom Shower/Tub: Occupational psychologist: Standard Bathroom Accessibility: Yes How Accessible: Accessible via walker Home Equipment: [place] - 2 wheels;Shower seat   Additional Comments: RW on top floor and second RW on bottom floor      Prior Functioning/Environment Level of Independence: Independent                 OT Problem List: Decreased knowledge of use of DME or AE;Decreased safety awareness;Pain      OT Treatment/Interventions:      OT Goals(Current goals can be found in the care plan section) Acute Rehab OT Goals Patient Stated Goal: go home OT Goal Formulation: With patient Time For Goal Achievement: 03/18/18 Potential to Achieve Goals: Good  OT Frequency:     Barriers to D/C:            Co-evaluation              AM-PAC OT "6 Clicks" Daily Activity     Outcome Measure Help from another person eating meals?: None Help from another person taking care of personal grooming?: A Little Help from another person toileting, which includes using toliet, bedpan, or urinal?: A Little Help from another person bathing (including washing, rinsing, drying)?: A Little Help from another person to put on and taking off regular upper body clothing?: A Little Help from another person to put on and taking off regular lower body clothing?: A Little 6 Click Score: 19   End of Session Equipment Utilized During Treatment: Gait belt;Rolling walker;Back brace  Activity Tolerance: Patient tolerated treatment well Patient left: in  bed;with call bell/phone within reach  OT Visit Diagnosis: Pain Pain - part of body: (back)                Time: 6644-0347 OT Time Calculation (min): 51 min Charges:  OT General Charges $OT Visit: 1 Visit OT Evaluation $OT Eval Low Complexity: 1 Low OT Treatments $Self Care/Home Management : 38-52 mins  Joeseph Amor OTR/L  Acute Rehab Services  [phone] office number [phone] pager number   Joeseph Amor 03/11/2018, 10:54 AM

## 2018-03-11 NOTE — Discharge Summary (Signed)
Physician Discharge Summary  Patient ID: [name] MRN: [id number] DOB/AGE: Dec 07, 1940 77 y.o.  Admit date: 03/10/2018 Discharge date: 03/11/2018  Admission Diagnoses:1. Lumbar stenosis without neurogenic claudication 2. Spondylolisthesis, L4-5  Discharge Diagnoses: 1. Lumbar stenosis without neurogenic claudication 2. Spondylolisthesis, L4-5  Active Problems:   Spondylolisthesis of lumbar region   Degenerative lumbar spinal stenosis   Discharged Condition: good  Hospital Course: Patient underwent: 1. L4 laminectomy with facetectomy for decompression of exiting nerve roots, more than would be required for placement of interbody graft 2. Placement of anterior interbody device - Medtronic expandable 54mm lordotic cage x2 3. Posterior non-segmental instrumentation using cortical pedicle screws at L4 - L5 - Medtronic Solera 5.5 x 25mm x 4 4. Interbody arthrodesis, L4-5 5. Use of locally harvested bone autograft 6. Use of non-structural bone allograft - rhBMP-2  He did well after surgery and was ambulating well with PT.  Patient was discharged home on am of POD 1.  Consults: None  Significant Diagnostic Studies: None  Treatments: surgery: 1. L4 laminectomy with facetectomy for decompression of exiting nerve roots, more than would be required for placement of interbody graft 2. Placement of anterior interbody device - Medtronic expandable 52mm lordotic cage x2 3. Posterior non-segmental instrumentation using cortical pedicle screws at L4 - L5 - Medtronic Solera 5.5 x 46mm x 4 4. Interbody arthrodesis, L4-5 5. Use of locally harvested bone autograft 6. Use of non-structural bone allograft - rhBMP-2  Discharge Exam: Blood pressure (!) 104/58, pulse 79, temperature 97.6 F (36.4 C), temperature source Oral, resp. rate 16, height 5\' 2"  (1.575 m), weight 83 kg, SpO2 97 %. Neurologic: Alert and oriented X 3, normal strength and tone. Normal symmetric reflexes. Normal coordination  and gait Wound:CDI  Disposition: Home  Discharge Instructions    Diet - low sodium heart healthy   Complete by:  As directed    Increase activity slowly   Complete by:  As directed      Allergies as of 03/11/2018   No Known Allergies     Medication List    TAKE these medications   ARIPiprazole 5 MG tablet Commonly known as:  ABILIFY Take 1 tablet (5 mg total) by mouth daily.   CALTRATE 600 PLUS-VIT D PO Take 1 tablet by mouth daily.   febuxostat 40 MG tablet Commonly known as:  ULORIC TAKE 1 TABLET(40 MG) BY MOUTH DAILY What changed:    how much to take  how to take this  when to take this  additional instructions   febuxostat 40 MG tablet Commonly known as:  ULORIC TAKE 1 TABLET BY MOUTH DAILY What changed:  Another medication with the same name was changed. Make sure you understand how and when to take each.   furosemide 20 MG tablet Commonly known as:  LASIX Take 20 mg by mouth daily. Pt only takes 3 times a week.   L-METHYLFOLATE FORTE 15-90.314 MG Caps Take 1 capsule by mouth daily.   methocarbamol 500 MG tablet Commonly known as:  ROBAXIN Take 1 tablet (500 mg total) by mouth every 6 (six) hours as needed for muscle spasms.   oxyCODONE 5 MG immediate release tablet Commonly known as:  Oxy IR/ROXICODONE Take 1 tablet (5 mg total) by mouth every 6 (six) hours as needed for severe pain ((score 7 to 10)).   tranylcypromine 10 MG tablet Commonly known as:  PARNATE Take 30 mg by mouth 2 (two) times daily.   TYLENOL 8 HOUR 650 MG CR tablet Generic  drug:  acetaminophen Take 1,300 mg by mouth 2 (two) times daily.        Signed: Peggyann Shoals, MD 03/11/2018, 8:53 AM

## 2018-03-11 NOTE — Progress Notes (Signed)
Patient is discharged from room 3C09 at this time. Alert and in stable condition. IV site d/c'd and instructions read to patient and spouse with understanding verbalized. Left unit via wheelchair with all belongings at side. 

## 2018-03-11 NOTE — Progress Notes (Signed)
Subjective: Patient reports doing well  Objective: Vital signs in last 24 hours: Temp:  [97.3 F (36.3 C)-98.2 F (36.8 C)] 97.6 F (36.4 C) (12/21 0725) Pulse Rate:  [56-93] 79 (12/21 0725) Resp:  [13-20] 16 (12/21 0725) BP: (103-137)/(52-87) 104/58 (12/21 0725) SpO2:  [91 %-100 %] 97 % (12/21 0725) Weight:  [83 kg] 83 kg (12/20 1120)  Intake/Output from previous day: 12/20 0701 - 12/21 0700 In: 1500 [I.V.:900; IV Piggyback:600] Out: 835 [Urine:335; Blood:500] Intake/Output this shift: No intake/output data recorded.  Physical Exam: Improved leg pain.  Good strength both lower extremities.  Dressing CDI.  Lab Results: Recent Labs    03/08/18 1153 03/11/18 0607  WBC 8.0 14.2*  HGB 11.2* 10.5*  HCT 35.5* 33.2*  PLT 215 196   BMET Recent Labs    03/08/18 1153 03/11/18 0607  NA 139 141  K 4.1 5.0  CL 105 108  CO2 23 22  GLUCOSE 152* 129*  BUN 42* 41*  CREATININE 2.63* 2.62*  CALCIUM 9.3 8.8*    Studies/Results: Dg Lumbar Spine 2-3 Views  Result Date: 03/10/2018 CLINICAL DATA:  Spinal fusion. EXAM: LUMBAR SPINE - 2-3 VIEW; DG C-ARM 61-120 MIN COMPARISON:  DXA dated 06/29/2017 FINDINGS: PA and lateral C-arm views demonstrate interbody and pedicle screw and rod fusion at the L4-5 level normal alignment. IMPRESSION: Interbody and pedicle screw and rod fusion at L4-5. Electronically Signed   By: Claudie Revering M.D.   On: 03/10/2018 15:52   Dg C-arm 1-60 Min  Result Date: 03/10/2018 CLINICAL DATA:  Spinal fusion. EXAM: LUMBAR SPINE - 2-3 VIEW; DG C-ARM 61-120 MIN COMPARISON:  DXA dated 06/29/2017 FINDINGS: PA and lateral C-arm views demonstrate interbody and pedicle screw and rod fusion at the L4-5 level normal alignment. IMPRESSION: Interbody and pedicle screw and rod fusion at L4-5. Electronically Signed   By: Claudie Revering M.D.   On: 03/10/2018 15:52    Assessment/Plan: Patient is doing well.  Discharge home.    LOS: 1 day    Peggyann Shoals, MD 03/11/2018,  8:49 AM

## 2018-03-16 ENCOUNTER — Emergency Department (HOSPITAL_COMMUNITY)
Admission: EM | Admit: 2018-03-16 | Discharge: 2018-03-16 | Disposition: A | Discharge status: Home / Self Care | Payer: Medicare Other | Attending: Emergency Medicine | Admitting: Emergency Medicine

## 2018-03-16 ENCOUNTER — Emergency Department (HOSPITAL_COMMUNITY): Payer: Medicare Other

## 2018-03-16 ENCOUNTER — Encounter (HOSPITAL_COMMUNITY): Payer: Self-pay | Admitting: Emergency Medicine

## 2018-03-16 ENCOUNTER — Other Ambulatory Visit: Payer: Self-pay

## 2018-03-16 DIAGNOSIS — I130 Hypertensive heart and chronic kidney disease with heart failure and stage 1 through stage 4 chronic kidney disease, or unspecified chronic kidney disease: Secondary | ICD-10-CM | POA: Diagnosis not present

## 2018-03-16 DIAGNOSIS — Z96651 Presence of right artificial knee joint: Secondary | ICD-10-CM | POA: Diagnosis not present

## 2018-03-16 DIAGNOSIS — M4326 Fusion of spine, lumbar region: Secondary | ICD-10-CM | POA: Diagnosis not present

## 2018-03-16 DIAGNOSIS — K5903 Drug induced constipation: Secondary | ICD-10-CM

## 2018-03-16 DIAGNOSIS — Z79899 Other long term (current) drug therapy: Secondary | ICD-10-CM | POA: Diagnosis not present

## 2018-03-16 DIAGNOSIS — E039 Hypothyroidism, unspecified: Secondary | ICD-10-CM | POA: Diagnosis not present

## 2018-03-16 DIAGNOSIS — R600 Localized edema: Secondary | ICD-10-CM | POA: Insufficient documentation

## 2018-03-16 DIAGNOSIS — Z96611 Presence of right artificial shoulder joint: Secondary | ICD-10-CM | POA: Insufficient documentation

## 2018-03-16 DIAGNOSIS — G8918 Other acute postprocedural pain: Secondary | ICD-10-CM | POA: Diagnosis not present

## 2018-03-16 DIAGNOSIS — N184 Chronic kidney disease, stage 4 (severe): Secondary | ICD-10-CM | POA: Diagnosis not present

## 2018-03-16 DIAGNOSIS — I5032 Chronic diastolic (congestive) heart failure: Secondary | ICD-10-CM | POA: Insufficient documentation

## 2018-03-16 LAB — CBC
HCT: 29.7 % — ABNORMAL LOW (ref 39.0–52.0)
HEMOGLOBIN: 9.4 g/dL — AB (ref 13.0–17.0)
MCH: 31.4 pg (ref 26.0–34.0)
MCHC: 31.6 g/dL (ref 30.0–36.0)
MCV: 99.3 fL (ref 80.0–100.0)
NRBC: 0 % (ref 0.0–0.2)
Platelets: 264 10*3/uL (ref 150–400)
RBC: 2.99 MIL/uL — ABNORMAL LOW (ref 4.22–5.81)
RDW: 12.8 % (ref 11.5–15.5)
WBC: 7.6 10*3/uL (ref 4.0–10.5)

## 2018-03-16 LAB — BASIC METABOLIC PANEL
ANION GAP: 9 (ref 5–15)
BUN: 32 mg/dL — ABNORMAL HIGH (ref 8–23)
CO2: 22 mmol/L (ref 22–32)
Calcium: 9.1 mg/dL (ref 8.9–10.3)
Chloride: 106 mmol/L (ref 98–111)
Creatinine, Ser: 2.06 mg/dL — ABNORMAL HIGH (ref 0.61–1.24)
GFR calc Af Amer: 35 mL/min — ABNORMAL LOW (ref >60)
GFR calc non Af Amer: 30 mL/min — ABNORMAL LOW (ref >60)
Glucose, Bld: 102 mg/dL — ABNORMAL HIGH (ref 70–99)
Potassium: 4.3 mmol/L (ref 3.5–5.1)
Sodium: 137 mmol/L (ref 135–145)

## 2018-03-16 MED ORDER — POLYETHYLENE GLYCOL 3350 17 G PO PACK: 17.0000 g | PACK | Freq: Every day | ORAL | 0 refills | Status: DC

## 2018-03-16 MED ORDER — DEXAMETHASONE SODIUM PHOSPHATE 10 MG/ML IJ SOLN
10.0000 mg | Freq: Once | INTRAMUSCULAR | Status: AC
  Administered 2018-03-16: 10 mg via INTRAVENOUS
  Filled 2018-03-16: qty 1

## 2018-03-16 MED ORDER — METHYLPREDNISOLONE 4 MG PO TBPK: ORAL_TABLET | ORAL | 0 refills | Status: DC

## 2018-03-16 MED ORDER — HYDROMORPHONE HCL 1 MG/ML IJ SOLN
1.0000 mg | Freq: Once | INTRAMUSCULAR | Status: AC
  Administered 2018-03-16: 1 mg via INTRAVENOUS
  Filled 2018-03-16: qty 1

## 2018-03-16 NOTE — Consult Note (Signed)
Reason for Consult: postop leg pain Referring Physician: EDP  [name] is an 77 y.o. male.   HPI:  77 year old presented to the ED today 6 days postop from a one level PLIF by Dr. Kathyrn Sheriff. He states that his leg pain never got better and has progressively gotten worse since surgery. He has difficulty walking because of the pain. His pain is a 10/10, constant, burning and achey. Denies any new NT.   Past Medical History:  Diagnosis Date  . Arthritis    back  . Back pain    reason unknown  . Benign prostatic hyperplasia (BPH) with urinary urge incontinence    sees Dr. Jeffie Pollock   . Bipolar 1 disorder [facility])    sees Dr. Norma Fredrickson  . Chronic diastolic CHF (congestive heart failure) ([place]) 11/30/2017   right Heart cath in [facility] 10/2017 showed normal filling pressures. 2D echo with EF 55-60%, grade 1 DD, MIld RVE with normal RVF (normal RV size with followup echo 09/09/2017), trivial MR/PR, mild TR  . CKD (chronic kidney disease)    stage 3 followed by [facility]  . Depression    takes Parnate daily  . Diverticulitis of colon 05/27/2015  . Essential hypertension 04/11/2007  . Headache(784.0)    occasionally  . Hip fracture ([place]) 09/01/2017   left hip fracture  . History of colon polyps   . Hypothyroidism 10/03/2009   Noted by Dr. Arnoldo Morale- patient states never on medicine- may have been checked due to parnate and abilify   . 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 Joint pain   . Joint swelling   . Osteoporosis    takes Drisdol weekly and Caltrate daily  . Peripheral edema    takes torsemide daily followed by nephrology  . Shingles 05/27/2015  . Sleep apnea    wears c-pap    Past Surgical History:  Procedure Laterality Date  . CARDIAC CATHETERIZATION    . COLONOSCOPY W/ POLYPECTOMY  03-10-15   per Dr. Havery Moros, adenomatous polyps, repeat in 3 yrs   . HIP FRACTURE SURGERY Left 09/02/2017  . REVERSE  SHOULDER ARTHROPLASTY Right 05/22/2013   Procedure: REVERSE SHOULDER ARTHROPLASTY;  Surgeon: Nita Sells, MD;  Location: [facility];  Service: Orthopedics;  Laterality: Right;  Right reverse total shoulder  . TONSILLECTOMY    . TOTAL KNEE ARTHROPLASTY Right 12/08/2015   Procedure: TOTAL KNEE ARTHROPLASTY;  Surgeon: Dorna Leitz, MD;  Location: [facility];  Service: Orthopedics;  Laterality: Right;    No Known Allergies  Social History   Tobacco Use  . Smoking status: Never Smoker  . Smokeless tobacco: Never Used  Substance Use Topics  . Alcohol use: No    Alcohol/week: 0.0 standard drinks    Family History  Problem Relation Age of Onset  . Cancer Mother        unknown type  . Stroke Father   . Cancer Maternal Grandmother   . Appendicitis Paternal Grandfather   . Prostate cancer Brother   . Colon cancer Neg Hx   . Esophageal cancer Neg Hx   . Rectal cancer Neg Hx   . Stomach cancer Neg Hx      Review of Systems  Positive ROS: as above  All other systems have been reviewed and were otherwise negative with the exception of those mentioned in the HPI and as above.  Objective: Vital signs in last 24 hours: Temp:  [98.5  F (36.9 C)] 98.5 F (36.9 C) (12/26 0826) Pulse Rate:  [89-107] 93 (12/26 1000) Resp:  [18] 18 (12/26 0826) BP: (104-145)/(54-86) 127/59 (12/26 1000) SpO2:  [92 %-99 %] 92 % (12/26 1000) Weight:  [84 kg] 84 kg (12/26 0832)  General Appearance: Alert, cooperative, no distress, appears stated age, lethargic Head: Normocephalic, without obvious abnormality, atraumatic Eyes: PERRL, conjunctiva/corneas clear, EOM's intact, fundi benign, both eyes      Lungs:  respirations unlabored Heart: Regular rate and rhythm Extremities: Extremities normal, atraumatic, lower extremity edema   NEUROLOGIC:   Mental status: A&O x4, no aphasia, good attention span, Memory and fund of knowledge Motor Exam - grossly normal, normal tone and bulk Sensory Exam - grossly  normal Reflexes: symmetric, no pathologic reflexes, No Hoffman's, No clonus Coordination - grossly normal Gait - not tested Balance - not tested Cranial Nerves: I: smell Not tested  II: visual acuity  OS: na   OD: na  II: visual fields Full to confrontation  II: pupils Equal, round, reactive to light  III,VII: ptosis None  III,IV,VI: extraocular muscles  Full ROM  V: mastication   V: facial light touch sensation    V,VII: corneal reflex    VII: facial muscle function - upper    VII: facial muscle function - lower   VIII: hearing   IX: soft palate elevation    IX,X: gag reflex   XI: trapezius strength    XI: sternocleidomastoid strength   XI: neck flexion strength    XII: tongue strength      Data Review Lab Results  Component Value Date   WBC 7.6 03/16/2018   HGB 9.4 (L) 03/16/2018   HCT 29.7 (L) 03/16/2018   MCV 99.3 03/16/2018   PLT 264 03/16/2018   Lab Results  Component Value Date   NA 137 03/16/2018   K 4.3 03/16/2018   CL 106 03/16/2018   CO2 22 03/16/2018   BUN 32 (H) 03/16/2018   CREATININE 2.06 (H) 03/16/2018   GLUCOSE 102 (H) 03/16/2018   Lab Results  Component Value Date   INR 1.02 03/11/2018    Radiology: Dg Lumbar Spine Complete  Result Date: 03/16/2018 CLINICAL DATA:  Bilateral leg pain, lumbar surgery on Friday EXAM: LUMBAR SPINE - COMPLETE 4+ VIEW COMPARISON:  Intraoperative fluoroscopic images dated 03/10/2018 FINDINGS: Status post PLIF at L4-5 with interbody spacer. Pedicle screws are in satisfactory position. Degenerative changes at L5-S1. Status post ORIF of the left hip, incompletely visualized. No evidence of fracture or dislocation. Vertebral body heights are maintained. Visualized bony pelvis appears intact IMPRESSION: Status post PLIF at L4-5, without evidence of complication. Electronically Signed   By: Julian Hy M.D.   On: 03/16/2018 09:36     Assessment/Plan: 77 year old 6 days postop from one level PLIF with bilateral  lower extremity pain that never got better after surgery. He does have quite a bit of lower extremity edema because he stopped taking his Lasix. Will give 10mg  IV decadron and pain control. D/C home on medrol dose pack if pain can get under control here. If not, admit for observation overnight.    Ocie Cornfield Berit Raczkowski 03/16/2018 10:05 AM

## 2018-03-16 NOTE — ED Triage Notes (Signed)
Per pt: from home with c/o bilateral leg and back pain.  Pt states he had back surgery last week and has been unable to ambulate without increased pain.

## 2018-03-16 NOTE — ED Notes (Addendum)
Pt ambulated to and from bathroom in the hall, unsteady. States he is having more trouble but normally walks with a walker at home.

## 2018-03-16 NOTE — ED Notes (Signed)
Patient transported to X-ray 

## 2018-03-16 NOTE — ED Notes (Addendum)
Patient verbalizes understanding of discharge instructions. Opportunity for questioning and answers were provided. Armband removed by staff, pt discharged from ED.  

## 2018-03-16 NOTE — Discharge Instructions (Signed)
Take the medications as prescribed to help with constipation, follow-up with your neurosurgeon as planned

## 2018-03-16 NOTE — ED Provider Notes (Signed)
[facility] EMERGENCY DEPARTMENT Provider Note   CSN: [id number] Arrival date & time: 03/16/18  9509     History   Chief Complaint Chief Complaint  Patient presents with  . Back Pain  . Leg Pain    HPI [name] is a 77 y.o. male.  HPI Patient presents to the emergency room for evaluation of leg pain.  Patient was in the hospital on December 20 when he had a posterior lumbar interbody fusion of the fourth and fifth lumbar vertebrae.  Patient was discharged home and was taking oxycodone for pain.  Over the weekend he started having trouble with constipation.  He also started having pain radiate down into both of his legs.  The symptoms continued to increase in severity.  He did not think the pain medication was helping that much and along with some of the constipation symptoms he stopped taking his pain medications about 24 hours ago.  This morning his pain is increasing in severity.  He feels that he can barely walk because of the pain.  It increases with trying to stand up straight and moving around.  He denies any fevers but has felt chilled.  He denies any recent falls or injuries.  He does have some complaints of leg swelling and that seems to be a little bit more than usual but this is a chronic problem for him.  He has not woken 20 went from the surgery office this morning. Past Medical History:  Diagnosis Date  . Arthritis    back  . Back pain    reason unknown  . Benign prostatic hyperplasia (BPH) with urinary urge incontinence    sees Dr. Jeffie Pollock   . Bipolar 1 disorder [facility])    sees Dr. Norma Fredrickson  . Chronic diastolic CHF (congestive heart failure) ([place]) 11/30/2017   right Heart cath in [facility] 10/2017 showed normal filling pressures. 2D echo with EF 55-60%, grade 1 DD, MIld RVE with normal RVF (normal RV size with followup echo 09/09/2017), trivial MR/PR, mild TR  . CKD (chronic kidney disease)    stage 3 followed by [facility]  .  Depression    takes Parnate daily  . Diverticulitis of colon 05/27/2015  . Essential hypertension 04/11/2007  . Headache(784.0)    occasionally  . Hip fracture ([place]) 09/01/2017   left hip fracture  . History of colon polyps   . Hypothyroidism 10/03/2009   Noted by Dr. Arnoldo Morale- patient states never on medicine- may have been checked due to parnate and abilify   . 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 Joint pain   . Joint swelling   . Osteoporosis    takes Drisdol weekly and Caltrate daily  . Peripheral edema    takes torsemide daily followed by nephrology  . Shingles 05/27/2015  . Sleep apnea    wears c-pap    Patient Active Problem List   Diagnosis Date Noted  . Degenerative lumbar spinal stenosis 03/11/2018  . Spondylolisthesis of lumbar region 03/10/2018  . Chronic diastolic CHF (congestive heart failure) ([place]) 11/30/2017  . Leukocytosis 09/04/2017  . History of fracture of left hip 09/01/2017  . Senile purpura ([place]) 06/22/2017  . Osteoarthritis of spine 03/21/2017  . Chronic low back pain 03/08/2017  . Hypersomnolence 08/20/2016  . Gout 07/07/2016  . Other constipation 01/13/2016  . Primary osteoarthritis of right knee 12/08/2015  . Fatty liver 10/09/2014  . Gallstones  10/09/2014  . Bilateral lower extremity edema 09/25/2014  . Chronic venous insufficiency 08/29/2013  . Right shoulder pain 08/29/2013  . Neuropathy 01/20/2011  . OSA (obstructive sleep apnea) 10/30/2008  . Osteoporosis 09/25/2008  . CKD (chronic kidney disease), stage IV ([place]) 03/01/2008  . Mild depressed bipolar I disorder ([place]) 09/21/2006    Past Surgical History:  Procedure Laterality Date  . CARDIAC CATHETERIZATION    . COLONOSCOPY W/ POLYPECTOMY  03-10-15   per Dr. Havery Moros, adenomatous polyps, repeat in 3 yrs   . HIP FRACTURE SURGERY Left 09/02/2017  . REVERSE SHOULDER ARTHROPLASTY Right 05/22/2013   Procedure: REVERSE SHOULDER  ARTHROPLASTY;  Surgeon: Nita Sells, MD;  Location: [facility];  Service: Orthopedics;  Laterality: Right;  Right reverse total shoulder  . TONSILLECTOMY    . TOTAL KNEE ARTHROPLASTY Right 12/08/2015   Procedure: TOTAL KNEE ARTHROPLASTY;  Surgeon: Dorna Leitz, MD;  Location: [facility];  Service: Orthopedics;  Laterality: Right;        Home Medications    Prior to Admission medications   Medication Sig Start Date End Date Taking? Authorizing Provider  ARIPiprazole (ABILIFY) 5 MG tablet Take 1 tablet (5 mg total) by mouth daily. Patient taking differently: Take 2.5 mg by mouth daily.  09/14/11  Yes Ricard Dillon, MD  acetaminophen (TYLENOL 8 HOUR) 650 MG CR tablet Take 1,300 mg by mouth 2 (two) times daily.     [provider]  Calcium-Vitamin D (CALTRATE 600 PLUS-VIT D PO) Take 1 tablet by mouth daily.     [provider]  febuxostat (ULORIC) 40 MG tablet TAKE 1 TABLET BY MOUTH DAILY Patient taking differently: Take 40 mg by mouth daily.  03/01/18   [name], MD  furosemide (LASIX) 20 MG tablet Take 20 mg by mouth daily.     [provider]  methocarbamol (ROBAXIN) 500 MG tablet Take 1 tablet (500 mg total) by mouth every 6 (six) hours as needed for muscle spasms. 03/11/18   Erline Levine, MD  methylPREDNISolone (MEDROL DOSEPAK) 4 MG TBPK tablet Take as directed on the pack 03/16/18   Dorie Rank, MD  oxyCODONE (OXY IR/ROXICODONE) 5 MG immediate release tablet Take 1 tablet (5 mg total) by mouth every 6 (six) hours as needed for severe pain ((score 7 to 10)). 03/11/18   Erline Levine, MD  polyethylene glycol [facility]) packet Take 17 g by mouth daily. 03/16/18   Dorie Rank, MD  tranylcypromine (PARNATE) 10 MG tablet Take 30 mg by mouth 2 (two) times daily.     [provider]    Family History Family History  Problem Relation Age of Onset  . Cancer Mother        unknown type  . Stroke Father   . Cancer Maternal Grandmother   .  Appendicitis Paternal Grandfather   . Prostate cancer Brother   . Colon cancer Neg Hx   . Esophageal cancer Neg Hx   . Rectal cancer Neg Hx   . Stomach cancer Neg Hx     Social History Social History   Tobacco Use  . Smoking status: Never Smoker  . Smokeless tobacco: Never Used  Substance Use Topics  . Alcohol use: No    Alcohol/week: 0.0 standard drinks  . Drug use: No     Allergies   Patient has no known allergies.   Review of Systems Review of Systems  All other systems reviewed and are negative.    Physical Exam Updated Vital Signs BP  125/69   Pulse 79   Temp 98.5 F (36.9 C) (Oral)   Resp 14   Ht 1.575 m (5\' 2" )   Wt 84 kg   SpO2 97%   BMI 33.87 kg/m   Physical Exam Vitals signs and nursing note reviewed.  Constitutional:      General: He is not in acute distress.    Appearance: He is well-developed.  HENT:     Head: Normocephalic and atraumatic.     Right Ear: External ear normal.     Left Ear: External ear normal.  Eyes:     General: No scleral icterus.       Right eye: No discharge.        Left eye: No discharge.     Conjunctiva/sclera: Conjunctivae normal.  Neck:     Musculoskeletal: Neck supple.     Trachea: No tracheal deviation.  Cardiovascular:     Rate and Rhythm: Normal rate and regular rhythm.  Pulmonary:     Effort: Pulmonary effort is normal. No respiratory distress.     Breath sounds: Normal breath sounds. No stridor. No wheezing or rales.  Abdominal:     General: Bowel sounds are normal. There is no distension.     Palpations: Abdomen is soft.     Tenderness: There is no abdominal tenderness. There is no guarding or rebound.  Musculoskeletal:        General: No tenderness.     Right lower leg: Edema present.     Left lower leg: Edema present.     Comments: Surgical scar without any signs of erythema or drainage, no induration  Skin:    General: Skin is warm and dry.     Findings: No rash.  Neurological:     Mental  Status: He is alert.     Cranial Nerves: No cranial nerve deficit (no facial droop, extraocular movements intact, no slurred speech).     Sensory: No sensory deficit.     Motor: No abnormal muscle tone or seizure activity.     Coordination: Coordination normal.     Comments: Normal sensation bilaterally, 5 out of 5 plantarflexion and dorsiflexion      ED Treatments / Results  Labs (all labs ordered are listed, but only abnormal results are displayed) Labs Reviewed  CBC - Abnormal; Notable for the following components:      Result Value   RBC 2.99 (*)    Hemoglobin 9.4 (*)    HCT 29.7 (*)    All other components within normal limits  BASIC METABOLIC PANEL - Abnormal; Notable for the following components:   Glucose, Bld 102 (*)    BUN 32 (*)    Creatinine, Ser 2.06 (*)    GFR calc non Af Amer 30 (*)    GFR calc Af Amer 35 (*)    All other components within normal limits    EKG None  Radiology Dg Lumbar Spine Complete  Result Date: 03/16/2018 CLINICAL DATA:  Bilateral leg pain, lumbar surgery on Friday EXAM: LUMBAR SPINE - COMPLETE 4+ VIEW COMPARISON:  Intraoperative fluoroscopic images dated 03/10/2018 FINDINGS: Status post PLIF at L4-5 with interbody spacer. Pedicle screws are in satisfactory position. Degenerative changes at L5-S1. Status post ORIF of the left hip, incompletely visualized. No evidence of fracture or dislocation. Vertebral body heights are maintained. Visualized bony pelvis appears intact IMPRESSION: Status post PLIF at L4-5, without evidence of complication. Electronically Signed   By: Julian Hy M.D.   On:  03/16/2018 09:36    Procedures Procedures (including critical care time)  Medications Ordered in ED Medications  HYDROmorphone (DILAUDID) injection 1 mg (1 mg Intravenous Given 03/16/18 0849)  dexamethasone (DECADRON) injection 10 mg (10 mg Intravenous Given 03/16/18 1003)     Initial Impression / Assessment and Plan / ED Course  I have  reviewed the triage vital signs and the nursing notes.  Pertinent labs & imaging results that were available during my care of the patient were reviewed by me and considered in my medical decision making (see chart for details).  Clinical Course as of Mar 16 1253  Thu Mar 16, 2018  1003 Hgb decreased slightly compared to prior   [JK]  1003 Cr elevated but chronic, no acute changes   Basic metabolic panel(!) [JK]  0240 Pt sleeping after pain meds.  Pt has been seen by NSurg.  If pain controlled, ok for dc   [JK]    Clinical Course User Index [JK] Dorie Rank, MD    Patient presented with postoperative pain.  No findings to suggest acute infection.  Patient is neurologically intact.  Patient was treated with pain medications and steroids.  He was evaluated by neurosurgery.  His symptoms have improved and he is able to ambulate.  Patient is stable for outpatient discharge.  Will DC home with a Medrol Dosepak as directed by neurosurgery.  Patient will also be given a prescription for MiraLAX to help with his constipation associated with his opiate pain medications.  Final Clinical Impressions(s) / ED Diagnoses   Final diagnoses:  Post-op pain  Drug-induced constipation    ED Discharge Orders         Ordered    polyethylene glycol (MIRALAX) packet  Daily     03/16/18 1252    methylPREDNISolone (MEDROL DOSEPAK) 4 MG TBPK tablet     03/16/18 1254           Dorie Rank, MD 03/16/18 1255

## 2018-03-20 ENCOUNTER — Encounter: Payer: Self-pay | Admitting: Family Medicine

## 2018-03-20 ENCOUNTER — Ambulatory Visit (INDEPENDENT_AMBULATORY_CARE_PROVIDER_SITE_OTHER): Payer: Medicare Other | Admitting: Family Medicine

## 2018-03-20 VITALS — BP 134/70 | HR 98 | Temp 97.8°F | Ht 62.0 in | Wt 188.4 lb

## 2018-03-20 DIAGNOSIS — I5032 Chronic diastolic (congestive) heart failure: Secondary | ICD-10-CM

## 2018-03-20 DIAGNOSIS — M47812 Spondylosis without myelopathy or radiculopathy, cervical region: Secondary | ICD-10-CM | POA: Diagnosis not present

## 2018-03-20 NOTE — Patient Instructions (Addendum)
Health Maintenance Due  Topic Date Due  . COLONOSCOPY - need to consider when all healed up 03/09/2018   No changes today- we reviewed bloodwork from 4 days ago which looked pretty good so opted to hold off on repeat  You do have some mild anemia after surgery- we need to monitor this. Lets check in 6 weeks from now and can check on anemia as well as kidneys again  Glad you are back on the lasix. Lets continue that daily  Consider Dr. Paulla Fore visit if neck not doing better by follow up- you can call in for visit if you prefer as well

## 2018-03-20 NOTE — Progress Notes (Signed)
Subjective:  [name] is a 77 y.o. year old very pleasant male patient who presents for/with See problem oriented charting ROS- increased edema off lasix not improving, back pain and neck pain noted. Poor sleep due to pain   Past Medical History-  Patient Active Problem List   Diagnosis Date Noted  . Chronic diastolic CHF (congestive heart failure) ([place]) 11/30/2017    Priority: High  . Mild depressed bipolar I disorder ([place]) 09/21/2006    Priority: High  . Chronic low back pain 03/08/2017    Priority: Medium  . Gout 07/07/2016    Priority: Medium  . Fatty liver 10/09/2014    Priority: Medium  . OSA (obstructive sleep apnea) 10/30/2008    Priority: Medium  . Osteoporosis 09/25/2008    Priority: Medium  . CKD (chronic kidney disease), stage IV ([place]) 03/01/2008    Priority: Medium  . History of fracture of left hip 09/01/2017    Priority: Low  . Senile purpura ([place]) 06/22/2017    Priority: Low  . Osteoarthritis of spine 03/21/2017    Priority: Low  . Hypersomnolence 08/20/2016    Priority: Low  . Other constipation 01/13/2016    Priority: Low  . Primary osteoarthritis of right knee 12/08/2015    Priority: Low  . Gallstones 10/09/2014    Priority: Low  . Bilateral lower extremity edema 09/25/2014    Priority: Low  . Chronic venous insufficiency 08/29/2013    Priority: Low  . Right shoulder pain 08/29/2013    Priority: Low  . Neuropathy 01/20/2011    Priority: Low  . Degenerative lumbar spinal stenosis 03/11/2018  . Spondylolisthesis of lumbar region 03/10/2018  . Leukocytosis 09/04/2017    Medications- reviewed and updated Current Outpatient Medications  Medication Sig Dispense Refill  . acetaminophen (TYLENOL 8 HOUR) 650 MG CR tablet Take 1,300 mg by mouth 2 (two) times daily.     . ARIPiprazole (ABILIFY) 5 MG tablet Take 1 tablet (5 mg total) by mouth daily. (Patient taking differently: Take 2.5 mg by mouth daily. ) 90 tablet 3  . b complex vitamins tablet  Take 1 tablet by mouth daily.    . Calcium-Vitamin D (CALTRATE 600 PLUS-VIT D PO) Take 1 tablet by mouth daily.      Torsemide 20mg  daily    . febuxostat (ULORIC) 40 MG tablet TAKE 1 TABLET BY MOUTH DAILY (Patient taking differently: Take 40 mg by mouth daily. ) 30 tablet 1  . methocarbamol (ROBAXIN) 500 MG tablet Take 1 tablet (500 mg total) by mouth every 6 (six) hours as needed for muscle spasms. 60 tablet 0  . methylPREDNISolone (MEDROL DOSEPAK) 4 MG TBPK tablet Take as directed on the pack 21 tablet 0  . oxyCODONE (OXY IR/ROXICODONE) 5 MG immediate release tablet Take 1 tablet (5 mg total) by mouth every 6 (six) hours as needed for severe pain ((score 7 to 10)). 30 tablet 0  . polyethylene glycol (MIRALAX) packet Take 17 g by mouth daily. 14 each 0  . tranylcypromine (PARNATE) 10 MG tablet Take 30 mg by mouth 2 (two) times daily.      Objective: BP 134/70 (BP Location: Left Arm, Patient Position: Sitting, Cuff Size: Large)   Pulse 98   Temp 97.8 F (36.6 C) (Oral)   Ht 5\' 2"  (1.575 m)   Wt 188 lb 6.4 oz (85.5 kg)   SpO2 95%   BMI 34.46 kg/m  Gen: appears uncomfortable due to back, appears fatigued CV: RRR no murmurs rubs or  gallops Lungs: CTAB no crackles, wheeze, rhonchi Abdomen: soft/nontender/nondistended. Wearing back brace around midsection.  Ext: 1+ edema Skin: warm, dry Neuro: grossly normal, moves all extremities  Assessment/Plan:  Chronic diastolic CHF (congestive heart failure) (HCC) S: Patient states he stopped  Torsemide due to legs looking better but legs swole up a lot s he restarted about a week ago. Error noted- when he went into surgery they took torsemide off his list and added Lasix. A/P: we reviewed importance of him consistnetly taking the medication-torsemide, we added this back to his list today. He was worried about worsening kidney function- but we discussed risks of heart failure exacerbation as well  Osteoarthritis of spine S: Patient with low back  surgery about 2 weeks ago.  Still really dealing with some pain-states it can be severe at times.  He is using-Oxycodone from Dr. Kathyrn Sheriff as well as Tylenol.  He is not sleeping well.  As a result his neck is been bothering him a lot lately- we did review prior x-rays together which show arthritic changes in the neck as well. A/P: Patient with postsurgical discomfort-we are hoping this will improve and as this improves his sleep will improve.  As his sleep improves we are hoping his neck pain improves-he does report he has had to sleep somewhat differently to try to accommodate his back.  We discussed if he is not improving by follow-up we could pursue further work-up.  He could also follow-up with Dr. Paulla Fore if needed.  Patient wanted to update blood work today but we reviewed blood work from last week in emergency room and patient felt comfortable with those results-we can repeat next month at follow-up  Future Appointments  Date Time Provider [place]  05/01/2018 10:40 AM [name], MD [facility]   Return in about 6 weeks (around 05/01/2018).  Lab/Order associations: Chronic diastolic CHF (congestive heart failure) (HCC)  Spondylosis of cervical region without myelopathy or radiculopathy  Return precautions advised.  Garret Reddish, MD

## 2018-03-21 NOTE — Assessment & Plan Note (Signed)
S: Patient with low back surgery about 2 weeks ago.  Still really dealing with some pain-states it can be severe at times.  He is using-Oxycodone from Dr. Kathyrn Sheriff as well as Tylenol.  He is not sleeping well.  As a result his neck is been bothering him a lot lately- we did review prior x-rays together which show arthritic changes in the neck as well. A/P: Patient with postsurgical discomfort-we are hoping this will improve and as this improves his sleep will improve.  As his sleep improves we are hoping his neck pain improves-he does report he has had to sleep somewhat differently to try to accommodate his back.  We discussed if he is not improving by follow-up we could pursue further work-up.  He could also follow-up with Dr. Paulla Fore if needed.

## 2018-03-21 NOTE — Assessment & Plan Note (Signed)
S: Patient states he stopped  Torsemide due to legs looking better but legs swole up a lot s he restarted about a week ago. Error noted- when he went into surgery they took torsemide off his list and added Lasix. A/P: we reviewed importance of him consistnetly taking the medication-torsemide, we added this back to his list today. He was worried about worsening kidney function- but we discussed risks of heart failure exacerbation as well

## 2018-03-30 DIAGNOSIS — G4733 Obstructive sleep apnea (adult) (pediatric): Secondary | ICD-10-CM | POA: Diagnosis not present

## 2018-04-27 ENCOUNTER — Encounter: Payer: Self-pay | Admitting: Gastroenterology

## 2018-04-30 DIAGNOSIS — G4733 Obstructive sleep apnea (adult) (pediatric): Secondary | ICD-10-CM | POA: Diagnosis not present

## 2018-05-01 ENCOUNTER — Ambulatory Visit (INDEPENDENT_AMBULATORY_CARE_PROVIDER_SITE_OTHER): Payer: Medicare Other | Admitting: Family Medicine

## 2018-05-01 ENCOUNTER — Encounter: Payer: Self-pay | Admitting: Family Medicine

## 2018-05-01 VITALS — BP 132/86 | HR 93 | Temp 97.6°F | Ht 62.0 in | Wt 184.2 lb

## 2018-05-01 DIAGNOSIS — M545 Low back pain, unspecified: Secondary | ICD-10-CM

## 2018-05-01 DIAGNOSIS — N184 Chronic kidney disease, stage 4 (severe): Secondary | ICD-10-CM | POA: Diagnosis not present

## 2018-05-01 DIAGNOSIS — I5032 Chronic diastolic (congestive) heart failure: Secondary | ICD-10-CM | POA: Diagnosis not present

## 2018-05-01 DIAGNOSIS — G629 Polyneuropathy, unspecified: Secondary | ICD-10-CM | POA: Diagnosis not present

## 2018-05-01 DIAGNOSIS — Z1211 Encounter for screening for malignant neoplasm of colon: Secondary | ICD-10-CM

## 2018-05-01 DIAGNOSIS — G8929 Other chronic pain: Secondary | ICD-10-CM

## 2018-05-01 LAB — COMPREHENSIVE METABOLIC PANEL
ALT: 9 U/L (ref 0–53)
AST: 15 U/L (ref 0–37)
Albumin: 4.2 g/dL (ref 3.5–5.2)
Alkaline Phosphatase: 97 U/L (ref 39–117)
BUN: 56 mg/dL — ABNORMAL HIGH (ref 6–23)
CO2: 28 mEq/L (ref 19–32)
Calcium: 9.3 mg/dL (ref 8.4–10.5)
Chloride: 99 mEq/L (ref 96–112)
Creatinine, Ser: 2.56 mg/dL — ABNORMAL HIGH (ref 0.40–1.50)
GFR: 24.41 mL/min — ABNORMAL LOW (ref >60.00)
Glucose, Bld: 87 mg/dL (ref 70–99)
Potassium: 4 mEq/L (ref 3.5–5.1)
Sodium: 139 mEq/L (ref 135–145)
Total Bilirubin: 0.4 mg/dL (ref 0.2–1.2)
Total Protein: 6.6 g/dL (ref 6.0–8.3)

## 2018-05-01 LAB — CBC
HCT: 34.6 % — ABNORMAL LOW (ref 39.0–52.0)
Hemoglobin: 11.6 g/dL — ABNORMAL LOW (ref 13.0–17.0)
MCHC: 33.6 g/dL (ref 30.0–36.0)
MCV: 96.3 fl (ref 78.0–100.0)
Platelets: 267 10*3/uL (ref 150.0–400.0)
RBC: 3.59 Mil/uL — ABNORMAL LOW (ref 4.22–5.81)
RDW: 12.5 % (ref 11.5–15.5)
WBC: 7.2 10*3/uL (ref 4.0–10.5)

## 2018-05-01 LAB — VITAMIN B12: Vitamin B-12: 526 pg/mL (ref 211–911)

## 2018-05-01 LAB — TSH: TSH: 2.45 u[IU]/mL (ref 0.35–4.50)

## 2018-05-01 LAB — HEMOGLOBIN A1C: Hgb A1c MFr Bld: 5.5 % (ref 4.6–6.5)

## 2018-05-01 NOTE — Progress Notes (Signed)
Phone [phone]   Subjective:  [name] is a 78 y.o. year old very pleasant male patient who presents for/with See problem oriented charting ROS-no chest pain reported.  Edema has improved.  Does have some incontinence on the torsemide-request to decrease.  Continues with low back pain and neck pain  Past Medical History-  Patient Active Problem List   Diagnosis Date Noted  . Chronic diastolic CHF (congestive heart failure) ([place]) 11/30/2017    Priority: High  . Mild depressed bipolar I disorder ([place]) 09/21/2006    Priority: High  . Chronic low back pain with degenerative lumbar spinal stenosis-minimal improvement with surgery late 2019 03/08/2017    Priority: Medium  . Gout 07/07/2016    Priority: Medium  . Fatty liver 10/09/2014    Priority: Medium  . OSA (obstructive sleep apnea) 10/30/2008    Priority: Medium  . Osteoporosis 09/25/2008    Priority: Medium  . CKD (chronic kidney disease), stage IV ([place]) 03/01/2008    Priority: Medium  . History of fracture of left hip 09/01/2017    Priority: Low  . Senile purpura ([place]) 06/22/2017    Priority: Low  . Osteoarthritis of spine 03/21/2017    Priority: Low  . Hypersomnolence 08/20/2016    Priority: Low  . Other constipation 01/13/2016    Priority: Low  . Primary osteoarthritis of right knee 12/08/2015    Priority: Low  . Gallstones 10/09/2014    Priority: Low  . Bilateral lower extremity edema 09/25/2014    Priority: Low  . Chronic venous insufficiency 08/29/2013    Priority: Low  . Right shoulder pain 08/29/2013    Priority: Low  . Neuropathy 01/20/2011    Priority: Low  . Spondylolisthesis of lumbar region 03/10/2018  . Leukocytosis 09/04/2017    Medications- reviewed and updated Current Outpatient Medications  Medication Sig Dispense Refill  . acetaminophen (TYLENOL 8 HOUR) 650 MG CR tablet Take 1,300 mg by mouth 2 (two) times daily.     . ARIPiprazole (ABILIFY) 5 MG tablet Take 1 tablet (5 mg total)  by mouth daily. (Patient taking differently: Take 2.5 mg by mouth daily. ) 90 tablet 3  . b complex vitamins tablet Take 1 tablet by mouth daily.    . Calcium-Vitamin D (CALTRATE 600 PLUS-VIT D PO) Take 1 tablet by mouth daily.     . febuxostat (ULORIC) 40 MG tablet TAKE 1 TABLET BY MOUTH DAILY (Patient taking differently: Take 40 mg by mouth daily. ) 30 tablet 1  . torsemide (DEMADEX) 20 MG tablet Take 20 mg by mouth daily.    Marland Kitchen tranylcypromine (PARNATE) 10 MG tablet Take 30 mg by mouth 2 (two) times daily.      No current facility-administered medications for this visit.      Objective:  BP 132/86   Pulse 93   Temp 97.6 F (36.4 C) (Oral)   Ht 5\' 2"  (1.575 m)   Wt 184 lb 3.2 oz (83.6 kg)   SpO2 96%   BMI 33.69 kg/m  Gen: NAD, resting comfortably CV: RRR no  rubs or gallops Lungs: CTAB no crackles, wheeze, rhonchi Abdomen: soft/nontender/nondistended/normal bowel sounds.  Overweight Ext: 1+ edema Skin: warm, dry MSK: Limited range of motion of neck    Assessment and Plan    #Chronic diastolic congestive heart failure S: Compliant with torsemide 20 mg.  Weight down 4 pounds from last visit 2019-Has seen Dr. Radford Pax. Stress test ok as well as Holter monitor A/P:  Stable. Continue current medications.  Asks to stop torsemide due to some incontinence-we discussed the importance of continuing this   #CKD stage IV S: Follows with [facility].  Remains on torsemide 20 mg daily.  Knows to avoid NSAIDs. A/P: Update CMP today-continue torsemide and hopefully stable   #Chronic low back pain- degenerative lumbar stenosis.  Minimal improvement with surgery late 2019 S: Back pain has not improved significantly after surgery.Neck pain has continued and worsened from last visit. Hard to turn his head. Sees neurosurgery on the 17th.  -wants to get MRI- reviewed had one last februar A/P: Continued issues with low back and neck-patient requests MRI of the neck today and we reviewed had  1 about a year ago-I printed this for him so he could review with his neurosurgeon next week  Neuropathy S: Patient with prior neuropathy related to low back pain/spinal issues and lumbar spine- prior affected left foot great toe- now complains of some new issues-right heel gets hot when he wakes up from sleep- takes foot from [place] and helps. tip of his fingers gets sore- just aches. Massage doesn't help.  No shoulder or neck pain when has the hands issues A/P: We discussed issues could be neuropathy related-we will get opinion from neurosurgery on cervical spine contribution when he sees them next week.  We will also screen with labs such as CBC, TSH, CMP, B12 today  Other notes: 1.Mild anemia after surgery last visit.-Update CBC today  Lab/Order associations: Screening for colon cancer - Plan: Ambulatory referral to Gastroenterology  Neuropathy - Plan: CBC, Comprehensive metabolic panel, TSH, Vitamin B12, Hemoglobin L8H  Chronic diastolic CHF (congestive heart failure) ([facility])  CKD (chronic kidney disease), stage IV ([facility])  Chronic bilateral low back pain, unspecified whether sciatica present  Return precautions advised.  Garret Reddish, MD

## 2018-05-01 NOTE — Patient Instructions (Addendum)
Health Maintenance Due  Topic Date Due  . COLONOSCOPY  GI referral placed 03/09/2018   Please stop by lab before you go- look for any obvious cause of burning/numbness in hands If you do not have mychart- we will call you about results within 5 business days of [place] receiving them.  If you have mychart- we will send your results within 3 business days of [place] receiving them.  If abnormal or we want to clarify a result, we will call or mychart you to make sure you receive the message.  If you have questions or concerns or don't hear within 5-7 days, please send [place] a message or call us.

## 2018-05-01 NOTE — Assessment & Plan Note (Signed)
S: Patient with prior neuropathy related to low back pain/spinal issues and lumbar spine- prior affected left foot great toe- now complains of some new issues-right heel gets hot when he wakes up from sleep- takes foot from [facility] and helps. tip of his fingers gets sore- just aches. Massage doesn't help.  No shoulder or neck pain when has the hands issues A/P: We discussed issues could be neuropathy related-we will get opinion from neurosurgery on cervical spine contribution when he sees them next week.  We will also screen with labs such as CBC, TSH, CMP, B12 today

## 2018-05-02 ENCOUNTER — Encounter: Payer: Self-pay | Admitting: Gastroenterology

## 2018-05-08 DIAGNOSIS — M25532 Pain in left wrist: Secondary | ICD-10-CM | POA: Diagnosis not present

## 2018-05-15 ENCOUNTER — Other Ambulatory Visit: Payer: Self-pay | Admitting: Family Medicine

## 2018-05-16 DIAGNOSIS — N184 Chronic kidney disease, stage 4 (severe): Secondary | ICD-10-CM | POA: Diagnosis not present

## 2018-05-19 ENCOUNTER — Encounter: Payer: Self-pay | Admitting: Gastroenterology

## 2018-05-19 ENCOUNTER — Ambulatory Visit (AMBULATORY_SURGERY_CENTER): Payer: Self-pay | Admitting: *Deleted

## 2018-05-19 ENCOUNTER — Other Ambulatory Visit: Payer: Self-pay

## 2018-05-19 VITALS — Ht 63.0 in | Wt 182.0 lb

## 2018-05-19 DIAGNOSIS — M545 Low back pain: Secondary | ICD-10-CM | POA: Diagnosis not present

## 2018-05-19 DIAGNOSIS — Z86010 Personal history of colonic polyps: Secondary | ICD-10-CM

## 2018-05-19 MED ORDER — SUPREP BOWEL PREP KIT 17.5-3.13-1.6 GM/177ML PO SOLN: 1.0000 | Freq: Once | ORAL | 0 refills | Status: AC

## 2018-05-19 NOTE — Progress Notes (Signed)
No egg or soy allergy known to patient  No issues with past sedation with any surgeries  or procedures, no intubation problems  No diet pills per patient No home 02 use per patient  No blood thinners per patient  Pt denies issues with constipation  No A fib or A flutter  EMMI video offered and declined Suprep no more than 50.00 coupon given

## 2018-05-24 DIAGNOSIS — N184 Chronic kidney disease, stage 4 (severe): Secondary | ICD-10-CM | POA: Diagnosis not present

## 2018-05-24 DIAGNOSIS — I129 Hypertensive chronic kidney disease with stage 1 through stage 4 chronic kidney disease, or unspecified chronic kidney disease: Secondary | ICD-10-CM | POA: Diagnosis not present

## 2018-05-24 DIAGNOSIS — N2581 Secondary hyperparathyroidism of renal origin: Secondary | ICD-10-CM | POA: Diagnosis not present

## 2018-05-24 DIAGNOSIS — D631 Anemia in chronic kidney disease: Secondary | ICD-10-CM | POA: Diagnosis not present

## 2018-05-29 DIAGNOSIS — G4733 Obstructive sleep apnea (adult) (pediatric): Secondary | ICD-10-CM | POA: Diagnosis not present

## 2018-06-02 ENCOUNTER — Encounter: Payer: Medicare Other | Admitting: Gastroenterology

## 2018-06-02 ENCOUNTER — Other Ambulatory Visit: Payer: Self-pay

## 2018-06-02 ENCOUNTER — Encounter: Payer: Self-pay | Admitting: Gastroenterology

## 2018-06-02 ENCOUNTER — Ambulatory Visit (AMBULATORY_SURGERY_CENTER): Payer: Medicare Other | Admitting: Gastroenterology

## 2018-06-02 VITALS — BP 121/69 | HR 74 | Temp 97.5°F | Resp 16 | Ht 63.0 in | Wt 182.0 lb

## 2018-06-02 DIAGNOSIS — D127 Benign neoplasm of rectosigmoid junction: Secondary | ICD-10-CM | POA: Diagnosis not present

## 2018-06-02 DIAGNOSIS — D125 Benign neoplasm of sigmoid colon: Secondary | ICD-10-CM

## 2018-06-02 DIAGNOSIS — Z1211 Encounter for screening for malignant neoplasm of colon: Secondary | ICD-10-CM | POA: Diagnosis not present

## 2018-06-02 DIAGNOSIS — K635 Polyp of colon: Secondary | ICD-10-CM

## 2018-06-02 DIAGNOSIS — Z860100 Personal history of colon polyps, unspecified: Secondary | ICD-10-CM

## 2018-06-02 DIAGNOSIS — D128 Benign neoplasm of rectum: Secondary | ICD-10-CM | POA: Diagnosis not present

## 2018-06-02 MED ORDER — SODIUM CHLORIDE 0.9 % IV SOLN: 500.0000 mL | Freq: Once | INTRAVENOUS | Status: DC

## 2018-06-02 NOTE — Progress Notes (Signed)
Called to room to assist during endoscopic procedure.  Patient ID and intended procedure confirmed with present staff. Received instructions for my participation in the procedure from the performing physician.  

## 2018-06-02 NOTE — Progress Notes (Signed)
Pt's states no medical or surgical changes since previsit or office visit. 

## 2018-06-02 NOTE — Op Note (Signed)
[facility] Patient Name: [name] Procedure Date: 06/02/2018 10:34 AM MRN: [id number] Endoscopist: Remo Lipps P. Havery Moros , MD Age: 78 Referring MD:  Date of Birth: [birth year] Gender: Male Account #: [id number] Procedure:                Colonoscopy Indications:              Surveillance: Personal history of multiple                            adenomatous polyps on last colonoscopy 3 years ago Medicines:                Monitored Anesthesia Care Procedure:                Pre-Anesthesia Assessment:                           - Prior to the procedure, a History and Physical                            was performed, and patient medications and                            allergies were reviewed. The patient's tolerance of                            previous anesthesia was also reviewed. The risks                            and benefits of the procedure and the sedation                            options and risks were discussed with the patient.                            All questions were answered, and informed consent                            was obtained. Prior Anticoagulants: The patient has                            taken no previous anticoagulant or antiplatelet                            agents. ASA Grade Assessment: II - A patient with                            mild systemic disease. After reviewing the risks                            and benefits, the patient was deemed in                            satisfactory condition to undergo the procedure.  After obtaining informed consent, the colonoscope                            was passed under direct vision. Throughout the                            procedure, the patient's blood pressure, pulse, and                            oxygen saturations were monitored continuously. The                            Colonoscope was introduced through the anus and                            advanced to  the the cecum, identified by                            appendiceal orifice and ileocecal valve. The                            colonoscopy was performed without difficulty. The                            patient tolerated the procedure well. The quality                            of the bowel preparation was adequate. The                            ileocecal valve, appendiceal orifice, and rectum                            were photographed. Scope In: 10:40:50 AM Scope Out: 11:01:21 AM Scope Withdrawal Time: 0 hours 15 minutes 38 seconds  Total Procedure Duration: 0 hours 20 minutes 31 seconds  Findings:                 The perianal and digital rectal examinations were                            normal.                           Many medium-mouthed diverticula were found in the                            transverse colon and left colon.                           A 5 mm polyp was found in the sigmoid colon. The                            polyp was sessile. The polyp was removed with a  cold snare. Resection and retrieval were complete.                           A 3 mm polyp was found in the rectum. The polyp was                            sessile. The polyp was removed with a cold snare.                            Resection and retrieval were complete.                           There was a medium-sized lipoma both in the                            ascending colon and transverse colon.                           Internal hemorrhoids were found during retroflexion.                           The exam was otherwise without abnormality. Time                            was taken to lavage the colon to achieve adequate                            views. Complications:            No immediate complications. Estimated blood loss:                            Minimal. Estimated Blood Loss:     Estimated blood loss was minimal. Impression:               - Diverticulosis in  the transverse colon and in the                            left colon.                           - One 5 mm polyp in the sigmoid colon, removed with                            a cold snare. Resected and retrieved.                           - One 3 mm polyp in the rectum, removed with a cold                            snare. Resected and retrieved.                           - Medium-sized lipoma in the ascending colon and  transverse colon.                           - Internal hemorrhoids.                           - The examination was otherwise normal. Recommendation:           - Patient has a contact number available for                            emergencies. The signs and symptoms of potential                            delayed complications were discussed with the                            patient. Return to normal activities tomorrow.                            Written discharge instructions were provided to the                            patient.                           - Resume previous diet.                           - Continue present medications.                           - Await pathology results. Given findings today,                            further colonscopy surveillance is likely not needed Remo Lipps P. [place] Festa, MD 06/02/2018 11:10:19 AM This report has been signed electronically.

## 2018-06-02 NOTE — Progress Notes (Signed)
To PACU, VSS. Report to [email or web address] 

## 2018-06-02 NOTE — Patient Instructions (Signed)
Handouts given for polyps, diverticulosis and hemorrhoids.  YOU HAD AN ENDOSCOPIC PROCEDURE TODAY AT [facility]:   Refer to the procedure report that was given to you for any specific questions about what was found during the examination.  If the procedure report does not answer your questions, please call your gastroenterologist to clarify.  If you requested that your care partner not be given the details of your procedure findings, then the procedure report has been included in a sealed envelope for you to review at your convenience later.  YOU SHOULD EXPECT: Some feelings of bloating in the abdomen. Passage of more gas than usual.  Walking can help get rid of the air that was put into your GI tract during the procedure and reduce the bloating. If you had a lower endoscopy (such as a colonoscopy or flexible sigmoidoscopy) you may notice spotting of blood in your stool or on the toilet paper. If you underwent a bowel prep for your procedure, you may not have a normal bowel movement for a few days.  Please Note:  You might notice some irritation and congestion in your nose or some drainage.  This is from the oxygen used during your procedure.  There is no need for concern and it should clear up in a day or so.  SYMPTOMS TO REPORT IMMEDIATELY:   Following lower endoscopy (colonoscopy or flexible sigmoidoscopy):  Excessive amounts of blood in the stool  Significant tenderness or worsening of abdominal pains  Swelling of the abdomen that is new, acute  Fever of 100F or higher  For urgent or emergent issues, a gastroenterologist can be reached at any hour by calling [phone].   DIET:  We do recommend a small meal at first, but then you may proceed to your regular diet.  Drink plenty of fluids but you should avoid alcoholic beverages for 24 hours.  ACTIVITY:  You should plan to take it easy for the rest of today and you should NOT DRIVE or use heavy machinery until tomorrow  (because of the sedation medicines used during the test).    FOLLOW UP: Our staff will call the number listed on your records the next business day following your procedure to check on you and address any questions or concerns that you may have regarding the information given to you following your procedure. If we do not reach you, we will leave a message.  However, if you are feeling well and you are not experiencing any problems, there is no need to return our call.  We will assume that you have returned to your regular daily activities without incident.  If any biopsies were taken you will be contacted by phone or by letter within the next 1-3 weeks.  Please call us at [phone] if you have not heard about the biopsies in 3 weeks.    SIGNATURES/CONFIDENTIALITY: You and/or your care partner have signed paperwork which will be entered into your electronic medical record.  These signatures attest to the fact that that the information above on your After Visit Summary has been reviewed and is understood.  Full responsibility of the confidentiality of this discharge information lies with you and/or your care-partner.

## 2018-06-05 ENCOUNTER — Telehealth: Payer: Self-pay

## 2018-06-05 NOTE — Telephone Encounter (Signed)
  Follow up Call-  Call back number 06/02/2018  Post procedure Call Back phone  # [phone]  Permission to leave phone message Yes  Some recent data might be hidden     Patient questions:  Do you have a fever, pain , or abdominal swelling? No. Pain Score  0 *  Have you tolerated food without any problems? Yes.    Have you been able to return to your normal activities? Yes.    Do you have any questions about your discharge instructions: Diet   No. Medications  No. Follow up visit  No.  Do you have questions or concerns about your Care? No.  Actions: * If pain score is 4 or above: No action needed, pain <4.

## 2018-06-09 ENCOUNTER — Telehealth: Payer: Self-pay | Admitting: Family Medicine

## 2018-06-09 NOTE — Telephone Encounter (Signed)
See note

## 2018-06-09 NOTE — Telephone Encounter (Signed)
Copied from [place] [id number]. Topic: General - Other >> Jun 09, 2018  1:17 PM Keene Breath wrote: Reason for CRM: Patient called to request an order for a CPaP mask from [facility] that he can use at night.  Patient would like the nurse or doctor to call [facility] to see if he can get an order.  CB# for patient is [phone]

## 2018-06-12 ENCOUNTER — Encounter: Payer: Self-pay | Admitting: Gastroenterology

## 2018-06-13 DIAGNOSIS — G4733 Obstructive sleep apnea (adult) (pediatric): Secondary | ICD-10-CM | POA: Diagnosis not present

## 2018-06-16 NOTE — Telephone Encounter (Signed)
Rx has been faxed. Pt notified of update!

## 2018-06-29 DIAGNOSIS — G4733 Obstructive sleep apnea (adult) (pediatric): Secondary | ICD-10-CM | POA: Diagnosis not present

## 2018-07-04 DIAGNOSIS — D1801 Hemangioma of skin and subcutaneous tissue: Secondary | ICD-10-CM | POA: Diagnosis not present

## 2018-07-04 DIAGNOSIS — L814 Other melanin hyperpigmentation: Secondary | ICD-10-CM | POA: Diagnosis not present

## 2018-07-04 DIAGNOSIS — L821 Other seborrheic keratosis: Secondary | ICD-10-CM | POA: Diagnosis not present

## 2018-07-11 ENCOUNTER — Ambulatory Visit (INDEPENDENT_AMBULATORY_CARE_PROVIDER_SITE_OTHER): Payer: Medicare Other | Admitting: Family Medicine

## 2018-07-11 ENCOUNTER — Encounter: Payer: Self-pay | Admitting: Family Medicine

## 2018-07-11 VITALS — Ht 63.0 in | Wt 174.0 lb

## 2018-07-11 DIAGNOSIS — I5032 Chronic diastolic (congestive) heart failure: Secondary | ICD-10-CM

## 2018-07-11 DIAGNOSIS — N184 Chronic kidney disease, stage 4 (severe): Secondary | ICD-10-CM | POA: Diagnosis not present

## 2018-07-11 DIAGNOSIS — R358 Other polyuria: Secondary | ICD-10-CM | POA: Diagnosis not present

## 2018-07-11 DIAGNOSIS — G4733 Obstructive sleep apnea (adult) (pediatric): Secondary | ICD-10-CM

## 2018-07-11 DIAGNOSIS — R5383 Other fatigue: Secondary | ICD-10-CM | POA: Diagnosis not present

## 2018-07-11 DIAGNOSIS — R3589 Other polyuria: Secondary | ICD-10-CM

## 2018-07-11 LAB — POC URINALSYSI DIPSTICK (AUTOMATED)
Bilirubin, UA: NEGATIVE
Blood, UA: NEGATIVE
Glucose, UA: NEGATIVE
Ketones, UA: NEGATIVE
Leukocytes, UA: NEGATIVE
Nitrite, UA: NEGATIVE
Protein, UA: NEGATIVE
Spec Grav, UA: 1.015 (ref 1.010–1.025)
Urobilinogen, UA: 0.2 E.U./dL
pH, UA: 6 (ref 5.0–8.0)

## 2018-07-11 LAB — COMPREHENSIVE METABOLIC PANEL
ALT: 10 U/L (ref 0–53)
AST: 13 U/L (ref 0–37)
Albumin: 4.3 g/dL (ref 3.5–5.2)
Alkaline Phosphatase: 102 U/L (ref 39–117)
BUN: 55 mg/dL — ABNORMAL HIGH (ref 6–23)
CO2: 25 mEq/L (ref 19–32)
Calcium: 9.3 mg/dL (ref 8.4–10.5)
Chloride: 102 mEq/L (ref 96–112)
Creatinine, Ser: 2.57 mg/dL — ABNORMAL HIGH (ref 0.40–1.50)
GFR: 24.29 mL/min — ABNORMAL LOW (ref >60.00)
Glucose, Bld: 96 mg/dL (ref 70–99)
Potassium: 4.4 mEq/L (ref 3.5–5.1)
Sodium: 137 mEq/L (ref 135–145)
Total Bilirubin: 0.4 mg/dL (ref 0.2–1.2)
Total Protein: 6.6 g/dL (ref 6.0–8.3)

## 2018-07-11 LAB — CBC
HCT: 38.1 % — ABNORMAL LOW (ref 39.0–52.0)
Hemoglobin: 13 g/dL (ref 13.0–17.0)
MCHC: 34.1 g/dL (ref 30.0–36.0)
MCV: 94.3 fl (ref 78.0–100.0)
Platelets: 245 10*3/uL (ref 150.0–400.0)
RBC: 4.04 Mil/uL — ABNORMAL LOW (ref 4.22–5.81)
RDW: 12.5 % (ref 11.5–15.5)
WBC: 8.6 10*3/uL (ref 4.0–10.5)

## 2018-07-11 LAB — PSA: PSA: 0.62 ng/mL (ref 0.10–4.00)

## 2018-07-11 LAB — TSH: TSH: 3.36 u[IU]/mL (ref 0.35–4.50)

## 2018-07-11 NOTE — Assessment & Plan Note (Signed)
S: CKD thoguht due to Lithium. Compliant with torsemide. Unfortunately patient began taking nsaids for arthritic pains A/P: strongly encouraged patient to abstain from nsaids. Will update cmp today with labs

## 2018-07-11 NOTE — Progress Notes (Signed)
Phone [phone]   Subjective:  Virtual visit via Video note. Chief complaint: Chief Complaint  Patient presents with  . Fatigue    Has been feeling lethargic. He feels like he gets overaly tired. This has been doing on for several months. He gets 7-8 hours of interupted sleep daily. He wakes up several times night to urinate. He dozes off throughout the day occasionally.    This visit type was conducted due to national recommendations for restrictions regarding the COVID-19 Pandemic (e.g. social distancing).  This format is felt to be most appropriate for this patient at this time balancing risks to patient and risks to population by having him in for in person visit.  No physical exam was performed (except for noted visual exam or audio findings with Telehealth visits).    Our team/I attempted to connect with [name] on 07/11/18 at 11:00 AM EDT by a video enabled telemedicine application ([email or web address]) and verified that I am speaking with the correct person using two identifiers.  -Interactive audio and video telecommunications were attempted between this provider and patient, however failed, due to patient having technical difficulties OR patient did not have access to video capability.  We continued and completed visit with audio only. Location patient: Home[facility] Location provider: [facility], office Persons participating in the virtual visit:  patient  Our team/I discussed the limitations of evaluation and management by telemedicine and the availability of in person appointments. In light of current covid-19 pandemic, patient also understands that we are trying to protect them by minimizing in office contact if at all possible.  The patient expressed consent for telemedicine visit and agreed to proceed. Patient understands insurance will be billed.   ROS- has lost weight. States edema is not worsening. Denies hest pain or shortness of breath. Complains of fatigue/sleepiness.    Past  Medical History-  Patient Active Problem List   Diagnosis Date Noted  . Chronic diastolic CHF (congestive heart failure) ([place]) 11/30/2017    Priority: High  . Mild depressed bipolar I disorder ([place]) 09/21/2006    Priority: High  . Chronic low back pain with degenerative lumbar spinal stenosis-minimal improvement with surgery late 2019 03/08/2017    Priority: Medium  . Gout 07/07/2016    Priority: Medium  . Fatty liver 10/09/2014    Priority: Medium  . OSA (obstructive sleep apnea) 10/30/2008    Priority: Medium  . Osteoporosis 09/25/2008    Priority: Medium  . CKD (chronic kidney disease), stage IV ([place]) 03/01/2008    Priority: Medium  . History of fracture of left hip 09/01/2017    Priority: Low  . Senile purpura ([place]) 06/22/2017    Priority: Low  . Osteoarthritis of spine 03/21/2017    Priority: Low  . Hypersomnolence 08/20/2016    Priority: Low  . Other constipation 01/13/2016    Priority: Low  . Primary osteoarthritis of right knee 12/08/2015    Priority: Low  . Gallstones 10/09/2014    Priority: Low  . Bilateral lower extremity edema 09/25/2014    Priority: Low  . Chronic venous insufficiency 08/29/2013    Priority: Low  . Right shoulder pain 08/29/2013    Priority: Low  . Neuropathy 01/20/2011    Priority: Low  . Spondylolisthesis of lumbar region 03/10/2018  . Leukocytosis 09/04/2017    Medications- reviewed and updated Current Outpatient Medications  Medication Sig Dispense Refill  . ARIPiprazole (ABILIFY) 5 MG tablet Take 1 tablet (5 mg total) by mouth daily. (Patient taking differently: Take  2.5 mg by mouth daily. ) 90 tablet 3  . b complex vitamins tablet Take 1 tablet by mouth daily.    . Calcium-Vitamin D (CALTRATE 600 PLUS-VIT D PO) Take 1 tablet by mouth daily.     . febuxostat (ULORIC) 40 MG tablet TAKE 1 TABLET BY MOUTH DAILY 90 tablet 0  . ferrous sulfate 325 (65 FE) MG tablet Take 325 mg by mouth daily with breakfast.    . torsemide (DEMADEX) 20  MG tablet Take 20 mg by mouth daily.    Marland Kitchen tranylcypromine (PARNATE) 10 MG tablet Take 30 mg by mouth 2 (two) times daily.     Marland Kitchen acetaminophen (TYLENOL 8 HOUR) 650 MG CR tablet Take 1,300 mg by mouth 2 (two) times daily.      No current facility-administered medications for this visit.      Objective:  Ht 5\' 3"  (1.6 m)   Wt 174 lb (78.9 kg)   BMI 30.82 kg/m  No acute distress in voice No audible wheezing Normal speech     Assessment and Plan   # Fatigue/polyuria/OSA/hypersomnolence/diastolic heart failure S:Patient states he has been feeling lethargic/sleepy. Feels like he gets really tired during the day and can nod off. Getting 7 hours of sleep of sleep a night. Gets up a few times a night to go urinate.  Patient stopped torsemide for a few weeks and didn't make a difference- has restarted taking this. He thinks goes to bathroom every half hour then at night a few times a night.   Patient does snore- he is wearing his cpap everynight. Had some adjustments in CPAP a few motnhs ago he reports but doesn't look liek has seen Dr. Elsworth Soho since 2018 of pulmonolgoy A/P: main issue is hypersomnolence. I am willing to check labs to start but I told patient I thought he would likely need follow up with Dr. Elsworth Soho.  -since main issue is hypersomnolence- he was worried about his meds- I told him didn't see clear cause but would check with his psychiatrist as well.  - weight down and edema not up and compliant with torsemide so doubt CHF (he states symptoms did not worsen when he came off lasix short term)  -for polyuria- we will check a UA and PSA (typically would just do rectal but since we are trying to avoid prolonged in person visits- will start with PSA alone to evaluate for possible prostatitis) - heart failure sounds to be stable based on history- could cause fatigue but shouldn't be primary cause of hypersomnolence- continue current dose of lasix  CKD (chronic kidney disease), stage IV  ([name]) S: CKD thoguht due to Lithium. Compliant with torsemide. Unfortunately patient began taking nsaids for arthritic pains A/P: strongly encouraged patient to abstain from nsaids. Will update cmp today with labs  Refer to pulmonary if needed if no clear cause of symptoms in labs.   Lab/Order associations: Fatigue, unspecified type - Plan: CBC, Comprehensive metabolic panel, TSH  Polyuria - Plan: POCT Urinalysis Dipstick (Automated), PSA  CKD (chronic kidney disease), stage IV ([facility])  OSA (obstructive sleep apnea)  Return precautions advised.  Garret Reddish, MD

## 2018-07-11 NOTE — Patient Instructions (Addendum)
Video visit

## 2018-07-29 DIAGNOSIS — G4733 Obstructive sleep apnea (adult) (pediatric): Secondary | ICD-10-CM | POA: Diagnosis not present

## 2018-08-08 DIAGNOSIS — M545 Low back pain: Secondary | ICD-10-CM | POA: Diagnosis not present

## 2018-08-08 DIAGNOSIS — M4326 Fusion of spine, lumbar region: Secondary | ICD-10-CM | POA: Diagnosis not present

## 2018-08-15 DIAGNOSIS — M545 Low back pain: Secondary | ICD-10-CM | POA: Diagnosis not present

## 2018-08-15 DIAGNOSIS — M4326 Fusion of spine, lumbar region: Secondary | ICD-10-CM | POA: Diagnosis not present

## 2018-08-17 DIAGNOSIS — M545 Low back pain: Secondary | ICD-10-CM | POA: Diagnosis not present

## 2018-08-17 DIAGNOSIS — M4326 Fusion of spine, lumbar region: Secondary | ICD-10-CM | POA: Diagnosis not present

## 2018-08-22 DIAGNOSIS — M4326 Fusion of spine, lumbar region: Secondary | ICD-10-CM | POA: Diagnosis not present

## 2018-08-22 DIAGNOSIS — M545 Low back pain: Secondary | ICD-10-CM | POA: Diagnosis not present

## 2018-08-29 DIAGNOSIS — G4733 Obstructive sleep apnea (adult) (pediatric): Secondary | ICD-10-CM | POA: Diagnosis not present

## 2018-08-29 DIAGNOSIS — M545 Low back pain: Secondary | ICD-10-CM | POA: Diagnosis not present

## 2018-08-29 DIAGNOSIS — M4326 Fusion of spine, lumbar region: Secondary | ICD-10-CM | POA: Diagnosis not present

## 2018-09-05 DIAGNOSIS — M545 Low back pain: Secondary | ICD-10-CM | POA: Diagnosis not present

## 2018-09-05 DIAGNOSIS — M4326 Fusion of spine, lumbar region: Secondary | ICD-10-CM | POA: Diagnosis not present

## 2018-09-11 DIAGNOSIS — M545 Low back pain: Secondary | ICD-10-CM | POA: Diagnosis not present

## 2018-09-13 NOTE — Telephone Encounter (Signed)
Taken care of by vicki

## 2018-09-19 ENCOUNTER — Other Ambulatory Visit: Payer: Self-pay | Admitting: Orthopedic Surgery

## 2018-09-19 DIAGNOSIS — M545 Low back pain, unspecified: Secondary | ICD-10-CM

## 2018-09-19 DIAGNOSIS — M4326 Fusion of spine, lumbar region: Secondary | ICD-10-CM | POA: Diagnosis not present

## 2018-09-20 ENCOUNTER — Other Ambulatory Visit: Payer: Self-pay | Admitting: Family Medicine

## 2018-09-20 DIAGNOSIS — N184 Chronic kidney disease, stage 4 (severe): Secondary | ICD-10-CM | POA: Diagnosis not present

## 2018-09-21 NOTE — Telephone Encounter (Signed)
Last OV 07/11/18 Prescribed by historical provider Next OV not scheduled

## 2018-09-26 DIAGNOSIS — Z1159 Encounter for screening for other viral diseases: Secondary | ICD-10-CM | POA: Diagnosis not present

## 2018-09-26 DIAGNOSIS — I129 Hypertensive chronic kidney disease with stage 1 through stage 4 chronic kidney disease, or unspecified chronic kidney disease: Secondary | ICD-10-CM | POA: Diagnosis not present

## 2018-09-26 DIAGNOSIS — D631 Anemia in chronic kidney disease: Secondary | ICD-10-CM | POA: Diagnosis not present

## 2018-09-26 DIAGNOSIS — N184 Chronic kidney disease, stage 4 (severe): Secondary | ICD-10-CM | POA: Diagnosis not present

## 2018-09-26 DIAGNOSIS — N2581 Secondary hyperparathyroidism of renal origin: Secondary | ICD-10-CM | POA: Diagnosis not present

## 2018-09-28 DIAGNOSIS — G4733 Obstructive sleep apnea (adult) (pediatric): Secondary | ICD-10-CM | POA: Diagnosis not present

## 2018-09-29 ENCOUNTER — Ambulatory Visit
Admission: RE | Admit: 2018-09-29 | Discharge: 2018-09-29 | Disposition: A | Discharge status: Home / Self Care | Payer: Medicare Other | Source: Ambulatory Visit | Attending: Orthopedic Surgery | Admitting: Orthopedic Surgery

## 2018-09-29 ENCOUNTER — Other Ambulatory Visit: Payer: Self-pay

## 2018-09-29 DIAGNOSIS — M545 Low back pain, unspecified: Secondary | ICD-10-CM

## 2018-10-03 DIAGNOSIS — M545 Low back pain: Secondary | ICD-10-CM | POA: Diagnosis not present

## 2018-10-13 DIAGNOSIS — M47816 Spondylosis without myelopathy or radiculopathy, lumbar region: Secondary | ICD-10-CM | POA: Diagnosis not present

## 2018-10-19 DIAGNOSIS — L570 Actinic keratosis: Secondary | ICD-10-CM | POA: Diagnosis not present

## 2018-10-19 DIAGNOSIS — I8310 Varicose veins of unspecified lower extremity with inflammation: Secondary | ICD-10-CM | POA: Diagnosis not present

## 2018-10-29 DIAGNOSIS — G4733 Obstructive sleep apnea (adult) (pediatric): Secondary | ICD-10-CM | POA: Diagnosis not present

## 2018-11-01 DIAGNOSIS — M19079 Primary osteoarthritis, unspecified ankle and foot: Secondary | ICD-10-CM | POA: Diagnosis not present

## 2018-11-08 ENCOUNTER — Ambulatory Visit (INDEPENDENT_AMBULATORY_CARE_PROVIDER_SITE_OTHER): Payer: Medicare Other | Admitting: Family Medicine

## 2018-11-08 ENCOUNTER — Other Ambulatory Visit: Payer: Self-pay

## 2018-11-08 ENCOUNTER — Encounter: Payer: Self-pay | Admitting: Family Medicine

## 2018-11-08 VITALS — BP 118/68 | HR 62 | Temp 98.1°F | Ht 62.0 in | Wt 186.6 lb

## 2018-11-08 DIAGNOSIS — H6122 Impacted cerumen, left ear: Secondary | ICD-10-CM

## 2018-11-08 NOTE — Progress Notes (Signed)
Subjective:     Patient ID: [name], male   DOB: [birth year], 78 y.o.   MRN: [id number]  HPI Patient is here with chief complaint that his ears feel "clogged ".  He feels the right is somewhat worse than the left.  He has not had any ear drainage.  No ear pain.  No dizziness.  No change in hearing.  No hearing aids.  Denies any recent nasal congestion.  No fevers or chills.  Past Medical History:  Diagnosis Date  . Arthritis    back  . Back pain    reason unknown  . Benign prostatic hyperplasia (BPH) with urinary urge incontinence    sees Dr. Jeffie Pollock   . Bipolar 1 disorder [facility])    sees Dr. Norma Fredrickson  . Chronic diastolic CHF (congestive heart failure) ([place]) 11/30/2017   right Heart cath in [facility] 10/2017 showed normal filling pressures. 2D echo with EF 55-60%, grade 1 DD, MIld RVE with normal RVF (normal RV size with followup echo 09/09/2017), trivial MR/PR, mild TR  . CKD (chronic kidney disease)    stage 3 followed by [facility]  . Depression    takes Parnate daily  . Diverticulitis of colon 05/27/2015  . Essential hypertension 04/11/2007  . Headache(784.0)    occasionally  . Hip fracture ([place] 09/01/2017   left hip fracture  . History of colon polyps   . Hypothyroidism 10/03/2009   Noted by Dr. Arnoldo Morale- patient states never on medicine- may have been checked due to parnate and abilify   . 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 Joint pain   . Joint swelling   . Osteoporosis    takes Drisdol weekly and Caltrate daily  . Peripheral edema    takes torsemide daily followed by nephrology  . Shingles 05/27/2015  . Sleep apnea    wears c-pap   Past Surgical History:  Procedure Laterality Date  . BACK SURGERY    . CARDIAC CATHETERIZATION    . COLONOSCOPY W/ POLYPECTOMY  03-10-15   per Dr. Havery Moros, adenomatous polyps, repeat in 3 yrs   . HIP FRACTURE SURGERY Left 09/02/2017  . REVERSE SHOULDER  ARTHROPLASTY Right 05/22/2013   Procedure: REVERSE SHOULDER ARTHROPLASTY;  Surgeon: Nita Sells, MD;  Location: [facility];  Service: Orthopedics;  Laterality: Right;  Right reverse total shoulder  . TONSILLECTOMY    . TOTAL KNEE ARTHROPLASTY Right 12/08/2015   Procedure: TOTAL KNEE ARTHROPLASTY;  Surgeon: Dorna Leitz, MD;  Location: [facility];  Service: Orthopedics;  Laterality: Right;    reports that he has never smoked. He has never used smokeless tobacco. He reports that he does not drink alcohol or use drugs. family history includes Appendicitis in his paternal grandfather; Cancer in his maternal grandmother and mother; Prostate cancer in his brother; Stroke in his father. No Known Allergies   Review of Systems  Constitutional: Negative for chills and fever.  HENT: Negative for congestion, ear discharge, ear pain, hearing loss and tinnitus.   Respiratory: Negative for cough.        Objective:   Physical Exam Constitutional:      Appearance: Normal appearance.  HENT:     Ears:     Comments: He has moderate cerumen blocking the left canal.  Right canal is clear.  Right eardrum appears normal.  No visible effusion. Cardiovascular:     Rate and Rhythm: Normal rate and regular rhythm.  Neurological:  Mental Status: He is alert.        Assessment:     Cerumen impaction involving left canal    Plan:     -We recommended irrigation of the left canal.  We discussed the risks including pain, bleeding, eardrum rupture and patient consented.  Patient had some hydrogen peroxide applied to the ear canal followed by irrigation with removal of large plug of wax left ear canal.  He tolerated well.  Symptomatic improvement afterwards  Eulas Post MD [facility] at [facility]

## 2018-11-09 DIAGNOSIS — M47816 Spondylosis without myelopathy or radiculopathy, lumbar region: Secondary | ICD-10-CM | POA: Diagnosis not present

## 2018-11-24 DIAGNOSIS — M47816 Spondylosis without myelopathy or radiculopathy, lumbar region: Secondary | ICD-10-CM | POA: Diagnosis not present

## 2018-11-24 DIAGNOSIS — H04123 Dry eye syndrome of bilateral lacrimal glands: Secondary | ICD-10-CM | POA: Diagnosis not present

## 2018-11-24 DIAGNOSIS — H5000 Unspecified esotropia: Secondary | ICD-10-CM | POA: Diagnosis not present

## 2018-11-24 DIAGNOSIS — H43813 Vitreous degeneration, bilateral: Secondary | ICD-10-CM | POA: Diagnosis not present

## 2018-11-24 DIAGNOSIS — H2513 Age-related nuclear cataract, bilateral: Secondary | ICD-10-CM | POA: Diagnosis not present

## 2018-11-28 ENCOUNTER — Encounter (HOSPITAL_COMMUNITY): Payer: Self-pay | Admitting: Family Medicine

## 2018-11-28 ENCOUNTER — Other Ambulatory Visit: Payer: Self-pay

## 2018-11-28 DIAGNOSIS — I130 Hypertensive heart and chronic kidney disease with heart failure and stage 1 through stage 4 chronic kidney disease, or unspecified chronic kidney disease: Secondary | ICD-10-CM | POA: Diagnosis not present

## 2018-11-28 DIAGNOSIS — Z96611 Presence of right artificial shoulder joint: Secondary | ICD-10-CM | POA: Insufficient documentation

## 2018-11-28 DIAGNOSIS — E039 Hypothyroidism, unspecified: Secondary | ICD-10-CM | POA: Diagnosis not present

## 2018-11-28 DIAGNOSIS — I5032 Chronic diastolic (congestive) heart failure: Secondary | ICD-10-CM | POA: Insufficient documentation

## 2018-11-28 DIAGNOSIS — Y92810 Car as the place of occurrence of the external cause: Secondary | ICD-10-CM | POA: Diagnosis not present

## 2018-11-28 DIAGNOSIS — Y9389 Activity, other specified: Secondary | ICD-10-CM | POA: Diagnosis not present

## 2018-11-28 DIAGNOSIS — Z79899 Other long term (current) drug therapy: Secondary | ICD-10-CM | POA: Diagnosis not present

## 2018-11-28 DIAGNOSIS — Y999 Unspecified external cause status: Secondary | ICD-10-CM | POA: Insufficient documentation

## 2018-11-28 DIAGNOSIS — S81812A Laceration without foreign body, left lower leg, initial encounter: Secondary | ICD-10-CM | POA: Insufficient documentation

## 2018-11-28 DIAGNOSIS — N183 Chronic kidney disease, stage 3 (moderate): Secondary | ICD-10-CM | POA: Insufficient documentation

## 2018-11-28 DIAGNOSIS — W228XXA Striking against or struck by other objects, initial encounter: Secondary | ICD-10-CM | POA: Diagnosis not present

## 2018-11-28 DIAGNOSIS — Z96651 Presence of right artificial knee joint: Secondary | ICD-10-CM | POA: Insufficient documentation

## 2018-11-28 DIAGNOSIS — S8992XA Unspecified injury of left lower leg, initial encounter: Secondary | ICD-10-CM | POA: Diagnosis present

## 2018-11-28 NOTE — ED Triage Notes (Signed)
Patient he was opening a car door and hit his left posterior leg. He has a laceration to the posterior lower leg that has been bandaged. Noted his left sock is soak with blood close to the ankle. Denies being on blood thinners. Last tetanus: less than 5 years ago.

## 2018-11-29 ENCOUNTER — Emergency Department (HOSPITAL_COMMUNITY)
Admission: EM | Admit: 2018-11-29 | Discharge: 2018-11-29 | Disposition: A | Discharge status: Home / Self Care | Payer: Medicare Other | Attending: Emergency Medicine | Admitting: Emergency Medicine

## 2018-11-29 DIAGNOSIS — G4733 Obstructive sleep apnea (adult) (pediatric): Secondary | ICD-10-CM | POA: Diagnosis not present

## 2018-11-29 DIAGNOSIS — S81812A Laceration without foreign body, left lower leg, initial encounter: Secondary | ICD-10-CM

## 2018-11-29 MED ORDER — CEPHALEXIN 500 MG PO CAPS: 500.0000 mg | ORAL_CAPSULE | Freq: Four times a day (QID) | ORAL | 0 refills | Status: DC

## 2018-11-29 MED ORDER — LIDOCAINE-EPINEPHRINE (PF) 2 %-1:200000 IJ SOLN
10.0000 mL | Freq: Once | INTRAMUSCULAR | Status: AC
  Administered 2018-11-29: 10 mL

## 2018-11-29 MED ORDER — CEPHALEXIN 500 MG PO CAPS
500.0000 mg | ORAL_CAPSULE | Freq: Once | ORAL | Status: AC
  Administered 2018-11-29: 500 mg via ORAL
  Filled 2018-11-29: qty 1

## 2018-11-29 MED ORDER — LIDOCAINE-EPINEPHRINE 2 %-1:100000 IJ SOLN: 20.0000 mL | Freq: Once | INTRAMUSCULAR | Status: DC

## 2018-11-29 NOTE — ED Notes (Signed)
Pt verbalized discharge instructions and follow up care. Alert and ambulatory. No IV. No further questions at this time. Wound cleaned and dressed.

## 2018-11-29 NOTE — ED Provider Notes (Signed)
[facility] DEPT Provider Note: Georgena Spurling, MD, FACEP  CSN: [id number] MRN: [id number] ARRIVAL: 11/28/18 at [place]: RESA/RESA   CHIEF COMPLAINT  Laceration   HISTORY OF PRESENT ILLNESS  11/29/18 1:18 AM [name] is a 78 y.o. male who cut the back of his left lower leg about 6 PM on a car door.  This occurred as he was opening the car door.  He has a large laceration at the site.  He rates associated pain is a 6 out of 10, worse with movement or palpation.  There was significant bleeding earlier but bleeding is now controlled.  He is not on anticoagulation.  His tetanus is up-to-date.   Past Medical History:  Diagnosis Date  . Arthritis    back  . Back pain    reason unknown  . Benign prostatic hyperplasia (BPH) with urinary urge incontinence    sees Dr. Jeffie Pollock   . Bipolar 1 disorder [facility])    sees Dr. Norma Fredrickson  . Chronic diastolic CHF (congestive heart failure) ([place]) 11/30/2017   right Heart cath in [facility] 10/2017 showed normal filling pressures. 2D echo with EF 55-60%, grade 1 DD, MIld RVE with normal RVF (normal RV size with followup echo 09/09/2017), trivial MR/PR, mild TR  . CKD (chronic kidney disease)    stage 3 followed by [facility]  . Depression    takes Parnate daily  . Diverticulitis of colon 05/27/2015  . Essential hypertension 04/11/2007  . Headache(784.0)    occasionally  . Hip fracture ([place]) 09/01/2017   left hip fracture  . History of colon polyps   . Hypothyroidism 10/03/2009   Noted by Dr. Arnoldo Morale- patient states never on medicine- may have been checked due to parnate and abilify   . 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 Joint pain   . Joint swelling   . Osteoporosis    takes Drisdol weekly and Caltrate daily  . Peripheral edema    takes torsemide daily followed by nephrology  . Shingles 05/27/2015  . Sleep apnea    wears c-pap    Past Surgical History:  Procedure  Laterality Date  . BACK SURGERY    . CARDIAC CATHETERIZATION    . COLONOSCOPY W/ POLYPECTOMY  03-10-15   per Dr. Havery Moros, adenomatous polyps, repeat in 3 yrs   . HIP FRACTURE SURGERY Left 09/02/2017  . REVERSE SHOULDER ARTHROPLASTY Right 05/22/2013   Procedure: REVERSE SHOULDER ARTHROPLASTY;  Surgeon: Nita Sells, MD;  Location: [facility];  Service: Orthopedics;  Laterality: Right;  Right reverse total shoulder  . TONSILLECTOMY    . TOTAL KNEE ARTHROPLASTY Right 12/08/2015   Procedure: TOTAL KNEE ARTHROPLASTY;  Surgeon: Dorna Leitz, MD;  Location: [facility];  Service: Orthopedics;  Laterality: Right;    Family History  Problem Relation Age of Onset  . Cancer Mother        unknown type  . Stroke Father   . Cancer Maternal Grandmother   . Appendicitis Paternal Grandfather   . Prostate cancer Brother   . Colon cancer Neg Hx   . Esophageal cancer Neg Hx   . Rectal cancer Neg Hx   . Stomach cancer Neg Hx   . Colon polyps Neg Hx     Social History   Tobacco Use  . Smoking status: Never Smoker  . Smokeless tobacco: Never Used  Substance Use Topics  . Alcohol use: No    Alcohol/week:  0.0 standard drinks  . Drug use: No    Prior to Admission medications   Medication Sig Start Date End Date Taking? Authorizing Provider  acetaminophen (TYLENOL 8 HOUR) 650 MG CR tablet Take 1,300 mg by mouth 2 (two) times daily.     [provider]  ARIPiprazole (ABILIFY) 5 MG tablet Take 1 tablet (5 mg total) by mouth daily. Patient taking differently: Take 2.5 mg by mouth daily.  09/14/11   Ricard Dillon, MD  b complex vitamins tablet Take 1 tablet by mouth daily.    [provider]  Calcium-Vitamin D (CALTRATE 600 PLUS-VIT D PO) Take 1 tablet by mouth daily.     [provider]  febuxostat (ULORIC) 40 MG tablet TAKE 1 TABLET BY MOUTH DAILY 05/16/18   [name], MD  ferrous sulfate 325 (65 FE) MG tablet Take 325 mg by mouth daily with breakfast.    [provider]  torsemide (DEMADEX) 20 MG tablet TAKE 1 TABLET BY MOUTH DAILY AS NEEDED FOR EDEMA, WEIGHT GAIN 09/21/18   [name], MD  tranylcypromine (PARNATE) 10 MG tablet Take 30 mg by mouth 2 (two) times daily.     [provider]    Allergies Patient has no known allergies.   REVIEW OF SYSTEMS  Negative except as noted here or in the History of Present Illness.   PHYSICAL EXAMINATION  Initial Vital Signs Blood pressure 134/90, pulse 91, temperature 98.1 F (36.7 C), temperature source Oral, resp. rate 16, height 5\' 2"  (1.575 m), weight 73.6 kg, SpO2 98 %.  Examination General: Well-developed, well-nourished male in no acute distress; appearance consistent with age of record HENT: normocephalic; atraumatic Eyes: Normal appearance Neck: supple Heart: regular rate and rhythm Lungs: clear to auscultation bilaterally Abdomen: soft; nondistended; nontender; bowel sounds present Extremities: No deformity; full range of motion; 2+ edema of lower legs with chronic appearing pigmentation changes; laceration left calf:    Neurologic: Awake, alert and oriented; motor function intact in all extremities and symmetric; no facial droop Skin: Warm and dry Psychiatric: Normal mood and affect   RESULTS  Summary of this visit's results, reviewed by myself:   EKG Interpretation  Date/Time:    Ventricular Rate:    PR Interval:    QRS Duration:   QT Interval:    QTC Calculation:   R Axis:     Text Interpretation:        Laboratory Studies: No results found for this or any previous visit (from the past 24 hour(s)). Imaging Studies: No results found.  ED COURSE and MDM  Nursing notes and initial vitals signs, including pulse oximetry, reviewed.  Vitals:   11/28/18 1932  BP: 134/90  Pulse: 91  Resp: 16  Temp: 98.1 F (36.7 C)  TempSrc: Oral  SpO2: 98%  Weight: 73.6 kg  Height: 5\' 2"  (1.575 m)   Wound closed.  Patient started on Keflex for infection  prophylaxis.  Patient has significant lower extremity edema and this may make him higher risk for infection.  He was advised to return for signs and symptoms of infection.  PROCEDURES   LACERATION REPAIR Performed by: Karen Chafe Carliss Quast Authorized by: Karen Chafe Naiara Lombardozzi Consent: Verbal consent obtained. Risks and benefits: risks, benefits and alternatives were discussed Consent given by: patient Patient identity confirmed: provided demographic data Prepped and Draped in normal sterile fashion Wound explored  Laceration Location: Left calf  Laceration Length: 9 cm  No Foreign Bodies seen or palpated  Anesthesia: local infiltration  Local anesthetic: lidocaine 2 % with epinephrine  Anesthetic total: 8.5 ml  Irrigation method: syringe Amount of cleaning: standard  Skin closure: 3-0 Prolene  Number of sutures: 13  Technique: Simple interrupted  Patient tolerance: Patient tolerated the procedure well with no immediate complications.    ED DIAGNOSES     ICD-10-CM   1. Laceration of left lower leg, initial encounter  Y56.943T        Shanon Rosser, MD 11/29/18 [phone]

## 2018-12-01 ENCOUNTER — Telehealth: Payer: Self-pay | Admitting: Family Medicine

## 2018-12-01 NOTE — Telephone Encounter (Signed)
Please advise if patient can be worked in.  Copied from [place] [phone]. Topic: Appointment Scheduling - Scheduling Inquiry for Clinic >> Dec 01, 2018 10:43 AM Burchel, Abbi R wrote: Reason for CRM:  Pt needs ED f/up appt with Dr Yong Channel on Monday 9/14 or Tuesday 9/15 re: laceration on lower leg requiring sutures.  Please call pt to sched.  [phone]

## 2018-12-01 NOTE — Telephone Encounter (Signed)
Can work in at 4:00 PM on 12/05/18. Please contact pt to schedule ED f/u for laceration (not hosp f/u as pt was not admitted).

## 2018-12-05 ENCOUNTER — Encounter: Payer: Self-pay | Admitting: Family Medicine

## 2018-12-05 ENCOUNTER — Ambulatory Visit (INDEPENDENT_AMBULATORY_CARE_PROVIDER_SITE_OTHER): Payer: Medicare Other | Admitting: Family Medicine

## 2018-12-05 ENCOUNTER — Other Ambulatory Visit: Payer: Self-pay

## 2018-12-05 VITALS — BP 110/76 | HR 92 | Temp 98.6°F | Ht 62.0 in | Wt 188.6 lb

## 2018-12-05 DIAGNOSIS — Z23 Encounter for immunization: Secondary | ICD-10-CM

## 2018-12-05 DIAGNOSIS — I5032 Chronic diastolic (congestive) heart failure: Secondary | ICD-10-CM | POA: Diagnosis not present

## 2018-12-05 DIAGNOSIS — L03116 Cellulitis of left lower limb: Secondary | ICD-10-CM | POA: Diagnosis not present

## 2018-12-05 DIAGNOSIS — S81812A Laceration without foreign body, left lower leg, initial encounter: Secondary | ICD-10-CM | POA: Diagnosis not present

## 2018-12-05 DIAGNOSIS — I479 Paroxysmal tachycardia, unspecified: Secondary | ICD-10-CM | POA: Insufficient documentation

## 2018-12-05 DIAGNOSIS — F3131 Bipolar disorder, current episode depressed, mild: Secondary | ICD-10-CM

## 2018-12-05 DIAGNOSIS — D692 Other nonthrombocytopenic purpura: Secondary | ICD-10-CM

## 2018-12-05 MED ORDER — DOXYCYCLINE HYCLATE 100 MG PO TABS: 100.0000 mg | ORAL_TABLET | Freq: Two times a day (BID) | ORAL | 0 refills | Status: DC

## 2018-12-05 NOTE — Patient Instructions (Addendum)
Health Maintenance Due  Topic Date Due  . INFLUENZA VACCINE - today 10/21/2018   Schedule a visit on  12/06/2018 at 8 40 AM (can use same day slot) so we can follow up on skin infection and remove sutures  Start doxycycline today due to infection  If you note worsening redness, swelling, pain before next week please see [place] back sooner  We will likely update bloodwork next week as well  Also reasonable to follow up with cardiology  Melvina at [facility], [place], [place] [address] [phone]

## 2018-12-05 NOTE — Progress Notes (Signed)
Phone [phone]   Subjective:  [name] is a 78 y.o. year old very pleasant male patient who presents for/with See problem oriented charting Chief Complaint  Patient presents with  . Acute Visit  . Laceration   ROS- no fever/chills/cough/congestion. No chest pain. No shortness of breath. Has noted edema worse in left leg. Still with issues with back pain    Past Medical History-  Patient Active Problem List   Diagnosis Date Noted  . Chronic diastolic CHF (congestive heart failure) ([place]) 11/30/2017    Priority: High  . Mild depressed bipolar I disorder ([place]) 09/21/2006    Priority: High  . Chronic low back pain with degenerative lumbar spinal stenosis-minimal improvement with surgery late 2019 03/08/2017    Priority: Medium  . Gout 07/07/2016    Priority: Medium  . Fatty liver 10/09/2014    Priority: Medium  . OSA (obstructive sleep apnea) 10/30/2008    Priority: Medium  . Osteoporosis 09/25/2008    Priority: Medium  . CKD (chronic kidney disease), stage IV ([place]) 03/01/2008    Priority: Medium  . History of fracture of left hip 09/01/2017    Priority: Low  . Senile purpura ([place]) 06/22/2017    Priority: Low  . Osteoarthritis of spine 03/21/2017    Priority: Low  . Hypersomnolence 08/20/2016    Priority: Low  . Other constipation 01/13/2016    Priority: Low  . Primary osteoarthritis of right knee 12/08/2015    Priority: Low  . Gallstones 10/09/2014    Priority: Low  . Bilateral lower extremity edema 09/25/2014    Priority: Low  . Chronic venous insufficiency 08/29/2013    Priority: Low  . Right shoulder pain 08/29/2013    Priority: Low  . Neuropathy 01/20/2011    Priority: Low  . Paroxysmal tachycardia ([place]) 12/05/2018  . Spondylolisthesis of lumbar region 03/10/2018  . Leukocytosis 09/04/2017    Medications- reviewed and updated Current Outpatient Medications  Medication Sig Dispense Refill  . acetaminophen (TYLENOL 8 HOUR) 650 MG CR tablet Take  1,300 mg by mouth 2 (two) times daily.     . ARIPiprazole (ABILIFY) 5 MG tablet Take 1 tablet (5 mg total) by mouth daily. (Patient taking differently: Take 2.5 mg by mouth daily. ) 90 tablet 3  . b complex vitamins tablet Take 1 tablet by mouth daily.    . Calcium-Vitamin D (CALTRATE 600 PLUS-VIT D PO) Take 1 tablet by mouth daily.     . cephALEXin (KEFLEX) 500 MG capsule Take 1 capsule (500 mg total) by mouth 4 (four) times daily. 20 capsule 0  . ferrous sulfate 325 (65 FE) MG tablet Take 325 mg by mouth daily with breakfast.    . torsemide (DEMADEX) 20 MG tablet TAKE 1 TABLET BY MOUTH DAILY AS NEEDED FOR EDEMA, WEIGHT GAIN 90 tablet 1  . tranylcypromine (PARNATE) 10 MG tablet Take 30 mg by mouth 2 (two) times daily.     Marland Kitchen doxycycline (VIBRA-TABS) 100 MG tablet Take 1 tablet (100 mg total) by mouth 2 (two) times daily for 7 days. 14 tablet 0  . febuxostat (ULORIC) 40 MG tablet TAKE 1 TABLET BY MOUTH DAILY (Patient not taking: Reported on 12/05/2018) 90 tablet 0   No current facility-administered medications for this visit.      Objective:  BP 110/76 (BP Location: Left Arm, Patient Position: Sitting, Cuff Size: Normal)   Pulse 92   Temp 98.6 F (37 C) (Temporal)   Ht 5\' 2"  (1.575 m)   Wt  188 lb 9.6 oz (85.5 kg)   SpO2 95%   BMI 34.50 kg/m  Gen: NAD, resting comfortably CV: RRR no murmurs rubs or gallops Lungs: CTAB no crackles, wheeze, rhonchi Abdomen: soft/nontender/nondistended Ext: trace edema on right, 1+ edema on left leg.  Skin: warm, dry, erythema  Noted around wound that has been sutured close- warm to touch. No significant tenderness.         Assessment and Plan   # Laceration/cellulitis S:Laceration LLE.   Injury occurred last Tuesday evening, leg was caught in car door. Was seen at [facility], 13 stitches placed. Pt has been keeping clean, using Polysporin,  and changing band aid daily.   He has been compliant with Keflex for infection prevention but unfortunately  in the last 2 days has noted some worsening redness and swelling around the wound-no discharge/purulence A/P: 6 days out from laceration- too early for removal of sutures on the leg- planned 8-day follow-up for removal.  We will place him on doxycycline for the next 7 days to cover for developed cellulitis- if he has new or worsening symptoms he should let us know immediately.  #Trouble swallowing S: Feels like foods get stuck in throat- coughs them up and then chews again and able to swallow A/P: Discussed possible GI referral and endoscopy-patient declines for now.  We discussed with not fully chewed food-would make sense to try smaller well chewed bites and if fails to improve-agrees to follow-up with GI   #Chronic diastolic congestive heart failure S: Compliant with torsemide 20 mg daily at present-particularly with leg swelling recently 2019-Has seen Dr. Radford Pax. Stress test ok as well as Holter monitor A/P: Encouraged him to continue regular torsemide and leg elevation-also hopeful treating cellulitis will help improve swelling   #CKD stage IV S: Follows with [facility].  Remains on torsemide 20 mg daily.  Knows to avoid NSAIDs.  Last labs were in July  A/P: Hopefully CKD stage IV is stable- we will likely update labs next week-did not draw today due to phlebotomist being out of office at time of our visit   #Chronic low back pain- degenerative lumbar stenosis.  Minimal improvement with surgery late 2019 S: Gabapentin not recently helpful- cause loose bowels and he had to stop medication A/P: Told patient to remain off of gabapentin due to side effects- can follow-up with orthopedics  #Bipolar- patient continues to follow-up with psychiatry- he is compliant with medication and denies worsening symptoms  #Senile purpura-patient states he continues to have easy bruising and bleeding.  Appears stable-continue to monitor  #Paroxysmal tachycardia listed by Dr. Radford Pax last year-  recommended patient schedule I year follow-up with Dr. Vivien Rota denies any recent issues  Recommended follow up: 8 day follow-up  Lab/Order associations:   ICD-10-CM   1. Laceration of left lower leg, initial encounter  S81.812A   2. Cellulitis of left lower extremity  L03.116   3. Chronic diastolic CHF (congestive heart failure) (HCC)  I50.32   4. Mild depressed bipolar I disorder (HCC)  F31.31   5. Paroxysmal tachycardia (HCC) Chronic I47.9   6. Senile purpura (HCC) Chronic D69.2   7. Need for influenza vaccination  Z23 Flu Vaccine QUAD High Dose(Fluad)    Meds ordered this encounter  Medications  . doxycycline (VIBRA-TABS) 100 MG tablet    Sig: Take 1 tablet (100 mg total) by mouth 2 (two) times daily for 7 days.    Dispense:  14 tablet    Refill:  0  Return precautions advised.  Garret Reddish, MD

## 2018-12-07 DIAGNOSIS — G4733 Obstructive sleep apnea (adult) (pediatric): Secondary | ICD-10-CM | POA: Diagnosis not present

## 2018-12-11 ENCOUNTER — Other Ambulatory Visit: Payer: Self-pay | Admitting: Family Medicine

## 2018-12-12 ENCOUNTER — Telehealth: Payer: Self-pay | Admitting: Family Medicine

## 2018-12-12 MED ORDER — DOXYCYCLINE HYCLATE 100 MG PO TABS: 100.0000 mg | ORAL_TABLET | Freq: Two times a day (BID) | ORAL | 0 refills | Status: AC

## 2018-12-12 NOTE — Progress Notes (Signed)
Phone [phone]   Subjective:  [name] is a 78 y.o. year old very pleasant male patient who presents for/with See problem oriented charting Chief Complaint  Patient presents with  . Follow-up  . Cellulitis   ROS-no fever/chills/nausea/vomiting.  Still has edema in the left lower leg after laceration as well as some warmth and tenderness but improving  Past Medical History-  Patient Active Problem List   Diagnosis Date Noted  . Chronic diastolic CHF (congestive heart failure) ([place]) 11/30/2017    Priority: High  . Mild depressed bipolar I disorder ([place]) 09/21/2006    Priority: High  . Chronic low back pain with degenerative lumbar spinal stenosis-minimal improvement with surgery late 2019 03/08/2017    Priority: Medium  . Gout 07/07/2016    Priority: Medium  . Fatty liver 10/09/2014    Priority: Medium  . OSA (obstructive sleep apnea) 10/30/2008    Priority: Medium  . Osteoporosis 09/25/2008    Priority: Medium  . CKD (chronic kidney disease), stage IV ([place]) 03/01/2008    Priority: Medium  . History of fracture of left hip 09/01/2017    Priority: Low  . Senile purpura ([place]) 06/22/2017    Priority: Low  . Osteoarthritis of spine 03/21/2017    Priority: Low  . Hypersomnolence 08/20/2016    Priority: Low  . Other constipation 01/13/2016    Priority: Low  . Primary osteoarthritis of right knee 12/08/2015    Priority: Low  . Gallstones 10/09/2014    Priority: Low  . Bilateral lower extremity edema 09/25/2014    Priority: Low  . Chronic venous insufficiency 08/29/2013    Priority: Low  . Right shoulder pain 08/29/2013    Priority: Low  . Neuropathy 01/20/2011    Priority: Low  . Paroxysmal tachycardia ([place]) 12/05/2018  . Spondylolisthesis of lumbar region 03/10/2018  . Leukocytosis 09/04/2017    Medications- reviewed and updated Current Outpatient Medications  Medication Sig Dispense Refill  . acetaminophen (TYLENOL 8 HOUR) 650 MG CR tablet Take 1,300  mg by mouth 2 (two) times daily.     . ARIPiprazole (ABILIFY) 5 MG tablet Take 1 tablet (5 mg total) by mouth daily. (Patient taking differently: Take 2.5 mg by mouth daily. ) 90 tablet 3  . b complex vitamins tablet Take 1 tablet by mouth daily.    . Calcium-Vitamin D (CALTRATE 600 PLUS-VIT D PO) Take 1 tablet by mouth daily.     Marland Kitchen doxycycline (VIBRA-TABS) 100 MG tablet Take 1 tablet (100 mg total) by mouth 2 (two) times daily for 7 days. 14 tablet 0  . febuxostat (ULORIC) 40 MG tablet TAKE 1 TABLET BY MOUTH DAILY 90 tablet 0  . ferrous sulfate 325 (65 FE) MG tablet Take 325 mg by mouth daily with breakfast.    . torsemide (DEMADEX) 20 MG tablet TAKE 1 TABLET BY MOUTH DAILY AS NEEDED FOR EDEMA, WEIGHT GAIN 90 tablet 1  . tranylcypromine (PARNATE) 10 MG tablet Take 30 mg by mouth 2 (two) times daily.      No current facility-administered medications for this visit.      Objective:  BP 122/72   Pulse 97   Temp 98.3 F (36.8 C)   Ht 5\' 2"  (1.575 m)   Wt 189 lb 6.4 oz (85.9 kg)   SpO2 97%   BMI 34.64 kg/m  Gen: NAD, resting comfortably  CV: RRR  Lungs: nonlabored, normal respiratory rate Abdomen: soft/nondistended  Ext: 1+ edema on right leg, 2+ on left- erythema slightly  less intense than last week, warmth slightly less intense than last week.  Removed 13 sutures today- some darkening of superficial tissues along suture line- separation by a few millimeters after removal of sutures. Skin: warm, dry, venous stasis changes on right lower leg-more significant erythema on left lower leg    Assessment and Plan   # Cellulitis S:Laceration LLE. Started on Doxycycline 12/05/18 BID for 7 days-have been on Keflex prior to that.  Pt states he is here to have stitches removed out of his left leg. His left leg and foot are very swollen. Pt has finished his doxycycline as of yesterday-I sent in an additional course last night A/P: Appears to be improving mildly- I think it is reasonable to extend  doxycycline an additional 7 days.  Unfortunately appears to have some depth of superficial tissues after laceration.  We used some Steri-Strips due to some separation between the wound-no obvious dehiscence/purulent material.  Advised to change dressing every 24-36 hours.  Conservative care per AVS as well as follow-up -Also recommended elevating the leg.  He should also continue his Lasix  Chronic low back pain with degenerative lumbar spinal stenosis-minimal improvement with surgery late 2019 S: Previously reported loose bowels on gabapentin- resolved when came off.  Patient reflects back now realizes loose stools have actually persisted A/P: Patient would like to restart gabapentin at low-dose-he has some at home-this is reasonable as long as he does not get oversedated.  I am willing to refill-should limit to 100 mg at bedtime due to renal function  Recommended follow up: Recommended 75-month follow-up or sooner if needed Future Appointments  Date Time Provider [place]  01/29/2019  3:00 PM Sueanne Margarita, MD [facility]  03/13/2019  2:40 PM Dakiyah Heinke, Brayton Mars, MD [facility]    Lab/Order associations:   ICD-10-CM   1. Cellulitis of left lower extremity  L03.116   2. Chronic bilateral low back pain, unspecified whether sciatica present  M54.5    G89.29    Return precautions advised.  Garret Reddish, MD

## 2018-12-12 NOTE — Telephone Encounter (Signed)
Forwarding to Dr. Hunter to advise if OK to refill.  

## 2018-12-12 NOTE — Telephone Encounter (Signed)
See note

## 2018-12-12 NOTE — Telephone Encounter (Signed)
Pt has completed Doxycycline (VIBRA TABS) 1 pill bid x 7 days prescribed on 12/05/18.  Please advise.

## 2018-12-12 NOTE — Telephone Encounter (Signed)
°  Relation to pt: self  Call back number: [phone] Pharmacy: [facility] [place], [place] [phone] ([phone] (Fax)    Reason for call:  Patient states foot is stilll red experiencing vibrating pain, patient requesting additional antibiotics (unaware of antibiotic name), please advise patient if Rx can be sent today.

## 2018-12-12 NOTE — Patient Instructions (Addendum)
  Continue doxycycline another 7 days-if you have worsening redness, worsening pain in the leg, fevers- please let us know immediately  I would probably change dressing every 24-36 hours-would not scrub area but can use some soapy water over it and can remove any accumulation of tissue  Can trial gabapentin again since he did not think it caused the loose stools now  Glad swallowing issues are better with smaller bites/more thorough chewing

## 2018-12-13 ENCOUNTER — Ambulatory Visit (INDEPENDENT_AMBULATORY_CARE_PROVIDER_SITE_OTHER): Payer: Medicare Other | Admitting: Family Medicine

## 2018-12-13 ENCOUNTER — Encounter: Payer: Self-pay | Admitting: Family Medicine

## 2018-12-13 ENCOUNTER — Other Ambulatory Visit: Payer: Self-pay

## 2018-12-13 VITALS — BP 122/72 | HR 97 | Temp 98.3°F | Ht 62.0 in | Wt 189.4 lb

## 2018-12-13 DIAGNOSIS — M545 Low back pain, unspecified: Secondary | ICD-10-CM

## 2018-12-13 DIAGNOSIS — L03116 Cellulitis of left lower limb: Secondary | ICD-10-CM | POA: Diagnosis not present

## 2018-12-13 DIAGNOSIS — G8929 Other chronic pain: Secondary | ICD-10-CM | POA: Diagnosis not present

## 2018-12-13 DIAGNOSIS — M47816 Spondylosis without myelopathy or radiculopathy, lumbar region: Secondary | ICD-10-CM | POA: Diagnosis not present

## 2018-12-13 NOTE — Assessment & Plan Note (Signed)
S: Previously reported loose bowels on gabapentin- resolved when came off.  Patient reflects back now realizes loose stools have actually persisted A/P: Patient would like to restart gabapentin at low-dose-he has some at home-this is reasonable as long as he does not get oversedated.  I am willing to refill-should limit to 100 mg at bedtime due to renal function

## 2018-12-13 NOTE — Telephone Encounter (Signed)
Pt seen in office today.

## 2018-12-27 ENCOUNTER — Encounter: Payer: Self-pay | Admitting: Family Medicine

## 2018-12-27 ENCOUNTER — Other Ambulatory Visit: Payer: Self-pay

## 2018-12-27 ENCOUNTER — Ambulatory Visit (INDEPENDENT_AMBULATORY_CARE_PROVIDER_SITE_OTHER): Payer: Medicare Other | Admitting: Family Medicine

## 2018-12-27 VITALS — BP 136/82 | HR 84 | Temp 98.2°F | Ht 62.0 in | Wt 191.4 lb

## 2018-12-27 DIAGNOSIS — M545 Low back pain, unspecified: Secondary | ICD-10-CM

## 2018-12-27 DIAGNOSIS — R600 Localized edema: Secondary | ICD-10-CM

## 2018-12-27 DIAGNOSIS — N184 Chronic kidney disease, stage 4 (severe): Secondary | ICD-10-CM

## 2018-12-27 DIAGNOSIS — M5416 Radiculopathy, lumbar region: Secondary | ICD-10-CM | POA: Diagnosis not present

## 2018-12-27 DIAGNOSIS — I5032 Chronic diastolic (congestive) heart failure: Secondary | ICD-10-CM

## 2018-12-27 DIAGNOSIS — G8929 Other chronic pain: Secondary | ICD-10-CM

## 2018-12-27 MED ORDER — PREDNISONE 20 MG PO TABS: ORAL_TABLET | ORAL | 0 refills | Status: DC

## 2018-12-27 NOTE — Progress Notes (Signed)
Phone [phone]   Subjective:  [name] is a 78 y.o. year old very pleasant male patient who presents for/with See problem oriented charting Chief Complaint  Patient presents with  . Follow-up  . right sided pain   ROS-no fever/chills/cough/congestion.  Has had some increased edema.  Notes some left calf pain with palpation.  Denies recent increased shortness of breath  Past Medical History-  Patient Active Problem List   Diagnosis Date Noted  . Chronic diastolic CHF (congestive heart failure) ([place]) 11/30/2017    Priority: High  . Mild depressed bipolar I disorder ([place]) 09/21/2006    Priority: High  . Chronic low back pain with degenerative lumbar spinal stenosis-minimal improvement with surgery late 2019 03/08/2017    Priority: Medium  . Gout 07/07/2016    Priority: Medium  . Fatty liver 10/09/2014    Priority: Medium  . OSA (obstructive sleep apnea) 10/30/2008    Priority: Medium  . Osteoporosis 09/25/2008    Priority: Medium  . CKD (chronic kidney disease), stage IV ([place]) 03/01/2008    Priority: Medium  . History of fracture of left hip 09/01/2017    Priority: Low  . Senile purpura ([place]) 06/22/2017    Priority: Low  . Osteoarthritis of spine 03/21/2017    Priority: Low  . Hypersomnolence 08/20/2016    Priority: Low  . Other constipation 01/13/2016    Priority: Low  . Primary osteoarthritis of right knee 12/08/2015    Priority: Low  . Gallstones 10/09/2014    Priority: Low  . Bilateral lower extremity edema 09/25/2014    Priority: Low  . Chronic venous insufficiency 08/29/2013    Priority: Low  . Right shoulder pain 08/29/2013    Priority: Low  . Neuropathy 01/20/2011    Priority: Low  . Paroxysmal tachycardia ([place]) 12/05/2018  . Spondylolisthesis of lumbar region 03/10/2018  . Leukocytosis 09/04/2017    Medications- reviewed and updated Current Outpatient Medications  Medication Sig Dispense Refill  . acetaminophen (TYLENOL 8 HOUR) 650 MG CR  tablet Take 1,300 mg by mouth 2 (two) times daily.     . ARIPiprazole (ABILIFY) 5 MG tablet Take 1 tablet (5 mg total) by mouth daily. (Patient taking differently: Take 2.5 mg by mouth daily. ) 90 tablet 3  . b complex vitamins tablet Take 1 tablet by mouth daily.    . Calcium-Vitamin D (CALTRATE 600 PLUS-VIT D PO) Take 1 tablet by mouth daily.     . febuxostat (ULORIC) 40 MG tablet TAKE 1 TABLET BY MOUTH DAILY 90 tablet 0  . ferrous sulfate 325 (65 FE) MG tablet Take 325 mg by mouth daily with breakfast.    . torsemide (DEMADEX) 20 MG tablet TAKE 1 TABLET BY MOUTH DAILY AS NEEDED FOR EDEMA, WEIGHT GAIN 90 tablet 1  . tranylcypromine (PARNATE) 10 MG tablet Take 30 mg by mouth 2 (two) times daily.     . predniSONE (DELTASONE) 20 MG tablet Take 1 tablet by mouth daily for 5 days, then 1/2 tablet daily for 2 days 6 tablet 0   No current facility-administered medications for this visit.      Objective:  BP 136/82   Pulse 84   Temp 98.2 F (36.8 C)   Ht 5\' 2"  (1.575 m)   Wt 191 lb 6.4 oz (86.8 kg)   SpO2 94%   BMI 35.01 kg/m  Gen: NAD, resting comfortably  CV: RRR  Lungs: nonlabored, normal respiratory rate Abdomen: soft/nondistended Ext: 1+ on the right, 2+ on the  left edema.  On lower portion of left calf healing wound noted-no purulence Skin: warm, dry MSK: Straight leg raise negative on the right.  Somewhat stiff but largely normal range of motion in the hips without pain.    Assessment and Plan  #Right low back pain with radiculopathy #Left lower leg edema #Chronic diastolic CHF S:Pt states when he gets up in the morning he has right side leg pain that sharp radiating from his back to  hip to his foot that has been going on for about a week. He also complains of swelling in his left lower leg  (left ankle/leg still swollen after cellulitis despite antibiotic treatment after prior laceration) has a hard time getting out of bed due to the pain. Never had radicular pain outside last  week. Restarted gabapentin bu tnot sure if helping. No fall or injury recently. Limiting his ability to move around. 10/10 pain in Am then gets better as day goes on. Tylenol in AM and PM seems to help some.   Patient at baseline has chronic low back pain- lumbar stenosis related to degenerative disc disease.  Had surgery late 2019 and only had mild improvement. A/P: Right low back pain with radiculopathy (in setting of DDD/lumbar stenosis)- we will treat with course of prednisone.  We discussed potential side effects.  In particular I worry about increasing edema or worsening of CHF.  He has had slight weight gain since last visit and swelling appears worse-we will increase torsemide to twice daily for the next 3 days.  Patient does have CKD stage IV and reviewed note from [facility] in July which stated kidney function was stable-I think he can tolerate a short-term increase in torsemide.  For left lower leg edema given the also has calf pain and 2+ edema on left versus 1+ on the right-we will get venous duplex.  I do think swelling and pain are likely related to prior laceration/cellulitis/resultant edema but I want to be cautious as he is also been more sedentary recently.  He understands if he has new or worsening symptoms or if he is not improving in regards to the lumbar radiculopathy within 10 to 14 days to let us know.  If he fails to improve would refer to Dr. Tamala Julian of sports medicine or Dr. Junius Roads of [facility] care  Recommended follow up: Scheduled follow-up for December-as needed for acute issue Future Appointments  Date Time Provider [place]  01/29/2019  3:00 PM Sueanne Margarita, MD [facility]  03/13/2019  2:40 PM Ashtynn Berke, Brayton Mars, MD [facility]    Lab/Order associations:   ICD-10-CM   1. Lumbar back pain with radiculopathy affecting right lower extremity  M54.16   2. Leg edema, left  R60.0 VAS [place] LOWER EXTREMITY VENOUS (DVT)  3. Chronic  diastolic CHF (congestive heart failure) (HCC)  I50.32   4. CKD (chronic kidney disease), stage IV ([place])  N18.4     Meds ordered this encounter  Medications  . predniSONE (DELTASONE) 20 MG tablet    Sig: Take 1 tablet by mouth daily for 5 days, then 1/2 tablet daily for 2 days    Dispense:  6 tablet    Refill:  0    Return precautions advised.  Garret Reddish, MD

## 2018-12-27 NOTE — Patient Instructions (Addendum)
Michael Ngo- please redress the back of his left leg- large bandage ok vs. Wrapping- use some bacitracin and a non stock dressing.   We will call you likely today or tomorrow to get you set up with ultrasound of left leg- if you have new or worsening symptoms or shortness of breath- seek care in emergency room  Start prednisone tomorrow morning with hopes that you improve at least 50% in 2 weeks. If not making substantial improvement- let me refer you to sports medicine or orthopedics at that time- you can simply give [place] a call and we can place referral- you do not have to have another visit with me.   Stop prednisone if you have worsening swelling or shortness of breath.   Try torsemide twice a day for next 3 days- separate dose by 6 hours

## 2018-12-28 ENCOUNTER — Ambulatory Visit (HOSPITAL_COMMUNITY)
Admission: RE | Admit: 2018-12-28 | Discharge: 2018-12-28 | Disposition: A | Discharge status: Home / Self Care | Payer: Medicare Other | Source: Ambulatory Visit | Attending: Cardiology | Admitting: Cardiology

## 2018-12-28 ENCOUNTER — Other Ambulatory Visit: Payer: Self-pay

## 2018-12-28 DIAGNOSIS — R600 Localized edema: Secondary | ICD-10-CM | POA: Diagnosis not present

## 2018-12-28 NOTE — Progress Notes (Signed)
Great news- no blood clot in the legs

## 2019-01-04 DIAGNOSIS — H43813 Vitreous degeneration, bilateral: Secondary | ICD-10-CM | POA: Diagnosis not present

## 2019-01-04 DIAGNOSIS — H5000 Unspecified esotropia: Secondary | ICD-10-CM | POA: Diagnosis not present

## 2019-01-04 DIAGNOSIS — H2513 Age-related nuclear cataract, bilateral: Secondary | ICD-10-CM | POA: Diagnosis not present

## 2019-01-04 DIAGNOSIS — H04123 Dry eye syndrome of bilateral lacrimal glands: Secondary | ICD-10-CM | POA: Diagnosis not present

## 2019-01-10 ENCOUNTER — Ambulatory Visit (INDEPENDENT_AMBULATORY_CARE_PROVIDER_SITE_OTHER): Payer: Medicare Other | Admitting: Family Medicine

## 2019-01-10 ENCOUNTER — Other Ambulatory Visit: Payer: Self-pay

## 2019-01-10 ENCOUNTER — Encounter: Payer: Self-pay | Admitting: Family Medicine

## 2019-01-10 VITALS — BP 104/68 | HR 94 | Temp 98.3°F | Resp 16 | Ht 62.0 in | Wt 188.0 lb

## 2019-01-10 DIAGNOSIS — I872 Venous insufficiency (chronic) (peripheral): Secondary | ICD-10-CM

## 2019-01-10 DIAGNOSIS — I5032 Chronic diastolic (congestive) heart failure: Secondary | ICD-10-CM

## 2019-01-10 DIAGNOSIS — S81802D Unspecified open wound, left lower leg, subsequent encounter: Secondary | ICD-10-CM

## 2019-01-10 DIAGNOSIS — R600 Localized edema: Secondary | ICD-10-CM

## 2019-01-10 MED ORDER — AMOXICILLIN-POT CLAVULANATE 875-125 MG PO TABS: 1.0000 | ORAL_TABLET | Freq: Two times a day (BID) | ORAL | 0 refills | Status: DC

## 2019-01-10 MED ORDER — TRIAMCINOLONE ACETONIDE 0.1 % EX CREA: 1.0000 "application " | TOPICAL_CREAM | Freq: Two times a day (BID) | CUTANEOUS | 0 refills | Status: DC

## 2019-01-10 NOTE — Progress Notes (Signed)
Subjective  CC:  Chief Complaint  Patient presents with  . Left leg infection    He reports he injured the leg 3 wks ago, had stitches removed.. Reports yesterday he noticed a foul smell and leaking of the area.. Has ben changing dressing daily and using polysoporin on the area   Same day acute visit; PCP not available. New pt to me. Chart reviewed.   HPI: [name] is a 78 y.o. male who presents to the office today to address the problems listed above in the chief complaint.  Pleasant 78 yo male with chronic venous insufficiency, chronic diastolic chf on bumex, presents to nonhealing leg wound. 09/09 was treated for leg lac at [facility] (note reviewed); cut leg on car door. Large v-shaped wound was closed with interrupted sutures and treated with keflex. Then needed 2 courses of doxy due to persistent infective sxs and poor healing. Last seen almost a month ago. Today c/o worsening drainage (drainage never stopped) and now odiferous. Minimal pain. Persistent redness in the skin and swelling. No f/c/s or malaise.   Assessment  1. Wound of left lower extremity, subsequent encounter   2. Chronic venous insufficiency   3. Chronic diastolic CHF (congestive heart failure) ([place])   4. Bilateral lower extremity edema      Plan   Persistent leg wound with infection:  Superficial wound culture done, ? Pseudomonas. Start augmentin and refer to wound care due to worsening wound.   Start steroid cream for chronic venous dermatitis, elevate legs and continue demadex. May warrant increase in dose short term.   Education and counseling given. Patient understands and agrees with care plan.    Follow up: referred to wound care  03/13/2019  Orders Placed This Encounter  Procedures  . Wound culture  . AMB referral to wound care center   Meds ordered this encounter  Medications  . triamcinolone cream (KENALOG) 0.1 %    Sig: Apply 1 application topically 2 (two) times daily. For 2 weeks, then as  needed    Dispense:  45 g    Refill:  0  . amoxicillin-clavulanate (AUGMENTIN) 875-125 MG tablet    Sig: Take 1 tablet by mouth 2 (two) times daily.    Dispense:  20 tablet    Refill:  0      I reviewed the patients updated PMH, FH, and SocHx.    Patient Active Problem List   Diagnosis Date Noted  . Paroxysmal tachycardia ([place]) 12/05/2018  . Spondylolisthesis of lumbar region 03/10/2018  . Chronic diastolic CHF (congestive heart failure) ([place]) 11/30/2017  . Leukocytosis 09/04/2017  . History of fracture of left hip 09/01/2017  . Senile purpura ([place]) 06/22/2017  . Osteoarthritis of spine 03/21/2017  . Chronic low back pain with degenerative lumbar spinal stenosis-minimal improvement with surgery late 2019 03/08/2017  . Hypersomnolence 08/20/2016  . Gout 07/07/2016  . Other constipation 01/13/2016  . Primary osteoarthritis of right knee 12/08/2015  . Fatty liver 10/09/2014  . Gallstones 10/09/2014  . Bilateral lower extremity edema 09/25/2014  . Chronic venous insufficiency 08/29/2013  . Right shoulder pain 08/29/2013  . Neuropathy 01/20/2011  . OSA (obstructive sleep apnea) 10/30/2008  . Osteoporosis 09/25/2008  . CKD (chronic kidney disease), stage IV ([place]) 03/01/2008  . Mild depressed bipolar I disorder ([place]) 09/21/2006   Current Meds  Medication Sig  . acetaminophen (TYLENOL 8 HOUR) 650 MG CR tablet Take 1,300 mg by mouth 2 (two) times daily.   . ARIPiprazole (ABILIFY) 5  MG tablet Take 1 tablet (5 mg total) by mouth daily. (Patient taking differently: Take 2.5 mg by mouth daily. )  . b complex vitamins tablet Take 1 tablet by mouth daily.  . Calcium-Vitamin D (CALTRATE 600 PLUS-VIT D PO) Take 1 tablet by mouth daily.   . febuxostat (ULORIC) 40 MG tablet TAKE 1 TABLET BY MOUTH DAILY  . ferrous sulfate 325 (65 FE) MG tablet Take 325 mg by mouth daily with breakfast.  . predniSONE (DELTASONE) 20 MG tablet Take 1 tablet by mouth daily for 5 days, then 1/2 tablet daily for 2  days  . torsemide (DEMADEX) 20 MG tablet TAKE 1 TABLET BY MOUTH DAILY AS NEEDED FOR EDEMA, WEIGHT GAIN  . tranylcypromine (PARNATE) 10 MG tablet Take 30 mg by mouth 2 (two) times daily.     Allergies: Patient has No Known Allergies. Family History: Patient family history includes Appendicitis in his paternal grandfather; Cancer in his maternal grandmother and mother; Prostate cancer in his brother; Stroke in his father. Social History:  Patient  reports that he has never smoked. He has never used smokeless tobacco. He reports that he does not drink alcohol or use drugs.  Review of Systems: Constitutional: Negative for fever malaise or anorexia Cardiovascular: negative for chest pain Respiratory: negative for SOB or persistent cough Gastrointestinal: negative for abdominal pain  Objective  Vitals: BP 104/68   Pulse 94   Temp 98.3 F (36.8 C) (Tympanic)   Resp 16   Ht 5\' 2"  (1.575 m)   Wt 188 lb (85.3 kg)   SpO2 100%   BMI 34.39 kg/m  General: no acute distress , A&Ox3 HEENT: PEERL, conjunctiva normal, Oropharynx moist,neck is supple Cardiovascular:  RRR without murmur or gallop.  Respiratory:  Good breath sounds bilaterally, CTAB with normal respiratory effort Skin:  Warm, chronic venous stasis changes noted bilateral LE with increased redness and induration on left. Posterior Left lower leg with now open wound draining pus with yellowish exudate. No fluctuance. +2 edema     Commons side effects, risks, benefits, and alternatives for medications and treatment plan prescribed today were discussed, and the patient expressed understanding of the given instructions. Patient is instructed to call or message via MyChart if he/she has any questions or concerns regarding our treatment plan. No barriers to understanding were identified. We discussed Red Flag symptoms and signs in detail. Patient expressed understanding regarding what to do in case of urgent or emergency type symptoms.    Medication list was reconciled, printed and provided to the patient in AVS. Patient instructions and summary information was reviewed with the patient as documented in the AVS. This note was prepared with assistance of Dragon voice recognition software. Occasional wrong-word or sound-a-like substitutions may have occurred due to the inherent limitations of voice recognition software

## 2019-01-10 NOTE — Patient Instructions (Signed)
We will call you with information regarding your referral appointment. [place] to help get your wound healed.  If you do not hear from [place] within the next week, please let me know.   Please start the antibiotic twice a day, augmentin.  You may use the steroid cream on the intact skin that is red.  Continue your bumex daily. Elevate you leg as much as possible.

## 2019-01-16 ENCOUNTER — Telehealth: Payer: Self-pay | Admitting: Family Medicine

## 2019-01-16 LAB — WOUND CULTURE
MICRO NUMBER:: 1014216
SPECIMEN QUALITY:: ADEQUATE

## 2019-01-16 NOTE — Telephone Encounter (Signed)
See note

## 2019-01-16 NOTE — Telephone Encounter (Signed)
[name] with [facility] called to let referral coordinator know that they offer patient an appt on 02/06/19 but pt decline service. He told them he would be out of town. He asked for them to call him if there was an opening sooner before he leave. If any questions she can be reached at [phone].

## 2019-01-17 ENCOUNTER — Other Ambulatory Visit: Payer: Self-pay | Admitting: Family Medicine

## 2019-01-17 MED ORDER — SULFAMETHOXAZOLE-TRIMETHOPRIM 400-80 MG PO TABS: 1.0000 | ORAL_TABLET | Freq: Two times a day (BID) | ORAL | 0 refills | Status: DC

## 2019-01-19 ENCOUNTER — Other Ambulatory Visit: Payer: Self-pay | Admitting: Family Medicine

## 2019-01-29 ENCOUNTER — Other Ambulatory Visit: Payer: Self-pay

## 2019-01-29 ENCOUNTER — Encounter: Payer: Self-pay | Admitting: Cardiology

## 2019-01-29 ENCOUNTER — Ambulatory Visit: Payer: Medicare Other | Admitting: Cardiology

## 2019-01-29 VITALS — BP 118/68 | HR 90 | Ht 63.0 in | Wt 187.0 lb

## 2019-01-29 DIAGNOSIS — R062 Wheezing: Secondary | ICD-10-CM

## 2019-01-29 DIAGNOSIS — I872 Venous insufficiency (chronic) (peripheral): Secondary | ICD-10-CM | POA: Diagnosis not present

## 2019-01-29 DIAGNOSIS — R9431 Abnormal electrocardiogram [ECG] [EKG]: Secondary | ICD-10-CM | POA: Diagnosis not present

## 2019-01-29 DIAGNOSIS — R000 Tachycardia, unspecified: Secondary | ICD-10-CM

## 2019-01-29 DIAGNOSIS — H2512 Age-related nuclear cataract, left eye: Secondary | ICD-10-CM | POA: Diagnosis not present

## 2019-01-29 DIAGNOSIS — N183 Chronic kidney disease, stage 3 unspecified: Secondary | ICD-10-CM

## 2019-01-29 NOTE — Patient Instructions (Signed)
Medication Instructions:   Your physician recommends that you continue on your current medications as directed. Please refer to the Current Medication list given to you today.  *If you need a refill on your cardiac medications before your next appointment, please call your pharmacy*   Lab Work:  TODAY-PRO-BNP AND CBC  If you have labs (blood work) drawn today and your tests are completely normal, you will receive your results only by: Marland Kitchen MyChart Message (if you have MyChart) OR . A paper copy in the mail If you have any lab test that is abnormal or we need to change your treatment, we will call you to review the results.   Testing/Procedures:  CHEST X-RAY FOR WHEEZING AT [facility]   Follow-Up: At [facility], you and your health needs are our priority.  As part of our continuing mission to provide you with exceptional heart care, we have created designated Provider Care Teams.  These Care Teams include your primary Cardiologist (physician) and Advanced Practice Providers (APPs -  Physician Assistants and Nurse Practitioners) who all work together to provide you with the care you need, when you need it.  Your next appointment:   AS NEEDED  The format for your next appointment:   Either In Person or Virtual  Provider:   Fransico Him, MD

## 2019-01-29 NOTE — Progress Notes (Signed)
Cardiology Office Note:    Date:  01/29/2019   ID:  [name], DOB March 17, 1941, MRN [id number]  PCP:  [name], MD  Cardiologist:  Fransico Him, MD    Referring MD: [name], MD   Chief Complaint  Patient presents with  . Follow-up    chronic venous insuff, CKD, tachycardia    History of Present Illness:    [name] is a 78 y.o. male with a hx of chronic diastolic CHF, chronic kidney disease stage III, hypertension bipolar disorder, chronic lower extremity edema on torsemide and chronic low back pain.   He was visiting his daughter in [place] in June and fell and fractured his hip.  He underwent surgical repair which was complicated by respiratory failure requiring BiPAP as well as acute on CKD stage 3 and post op ileus.  He was given a dx of acute diastolic CHF due to findings of diastolic dysfunction on echo but was felt to have resulted due to volume resuscitation with liters of fluid. Cardiology evaluated him during hospital stay and felt that diastolic CHF was a wrong dx.   He went to rehab and had problems with severe LE edema requiring leg wraps. It was felt that his LE edema was likely related to chronic venous stasis, hypoalbuminea, sedentary state and constantly sitting with legs hanging down.   Cxrays showed atelectasis with no effusions or edema.  Due to SOB and ongoing LE edema he was referred to another Cardiologist in [place] for a second opinion.  In review of the records apparently he denied having any SOB but his daughter informed the Cardiologist that his breathing had been terrible.  Echo 10/13/2017 showed small pericardial effusion and normal LVF with EF 70% and normal RVF with dilated non-collapsible IVC.  His BNP was only 17.  Due to inconsistency between data and sx he underwent right heart cath which showed normal filling pressures on 11/15/2017 (CVP was 73mmHg, normal PAP 36/85mmHg with mean 71mmHg, PCWP 97mmHg and CO 5.8L/min). It was  felt that his SOB and LE edema were unlikely to be due to a cardiac etiology. He was discharged home and set up to see Pulmonary.  His creatinine bumped from baseline of 2.5 to 2.8 and diuretics were held and creatinine dropped back to 2.6 and he was discharged on Torsemide 20mg  daily and was instructed to followup with his nephrologist in [place]. LE venous dopplers were negative for DVT.  When he was last seen by [facility] he was still complaining of lower extremity edema although his weight was down. [facility] manages his diuretics and he was instructed to continue current Torsemide 20mg  daily.  He is here today for followup and is doing well. He denies any chest pain or pressure, SOB, DOE, PND, orthopnea, dizziness, palpitations or syncope. He has LE edema which is chronic and stable. He is compliant with his meds and is tolerating meds with no SE.    Past Medical History:  Diagnosis Date  . Arthritis    back  . Back pain    reason unknown  . Benign prostatic hyperplasia (BPH) with urinary urge incontinence    sees Dr. Jeffie Pollock   . Bipolar 1 disorder [facility])    sees Dr. Norma Fredrickson  . Chronic diastolic CHF (congestive heart failure) ([place]) 11/30/2017   right Heart cath in [facility] 10/2017 showed normal filling pressures. 2D echo with EF 55-60%, grade 1 DD, MIld RVE with normal RVF (normal RV size with followup echo  09/09/2017), trivial MR/PR, mild TR  . CKD (chronic kidney disease)    stage 3 followed by [facility]  . Depression    takes Parnate daily  . Diverticulitis of colon 05/27/2015  . Essential hypertension 04/11/2007  . Headache(784.0)    occasionally  . Hip fracture ([place]) 09/01/2017   left hip fracture  . History of colon polyps   . Hypothyroidism 10/03/2009   Noted by Dr. Arnoldo Morale- patient states never on medicine- may have been checked due to parnate and abilify   . 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 Joint pain    . Joint swelling   . Osteoporosis    takes Drisdol weekly and Caltrate daily  . Peripheral edema    takes torsemide daily followed by nephrology  . Shingles 05/27/2015  . Sleep apnea    wears c-pap    Past Surgical History:  Procedure Laterality Date  . BACK SURGERY    . CARDIAC CATHETERIZATION    . COLONOSCOPY W/ POLYPECTOMY  03-10-15   per Dr. Havery Moros, adenomatous polyps, repeat in 3 yrs   . HIP FRACTURE SURGERY Left 09/02/2017  . REVERSE SHOULDER ARTHROPLASTY Right 05/22/2013   Procedure: REVERSE SHOULDER ARTHROPLASTY;  Surgeon: Nita Sells, MD;  Location: [facility];  Service: Orthopedics;  Laterality: Right;  Right reverse total shoulder  . TONSILLECTOMY    . TOTAL KNEE ARTHROPLASTY Right 12/08/2015   Procedure: TOTAL KNEE ARTHROPLASTY;  Surgeon: Dorna Leitz, MD;  Location: [facility];  Service: Orthopedics;  Laterality: Right;    Current Medications: Current Meds  Medication Sig  . acetaminophen (TYLENOL 8 HOUR) 650 MG CR tablet Take 1,300 mg by mouth 2 (two) times daily.   . ARIPiprazole (ABILIFY) 5 MG tablet Take 1 tablet (5 mg total) by mouth daily. (Patient taking differently: Take 2.5 mg by mouth daily. )  . b complex vitamins tablet Take 1 tablet by mouth daily.  . Calcium-Vitamin D (CALTRATE 600 PLUS-VIT D PO) Take 1 tablet by mouth daily.   . ferrous sulfate 325 (65 FE) MG tablet Take 325 mg by mouth daily with breakfast.  . torsemide (DEMADEX) 20 MG tablet TAKE 1 TABLET BY MOUTH DAILY AS NEEDED FOR EDEMA, WEIGHT GAIN  . tranylcypromine (PARNATE) 10 MG tablet Take 30 mg by mouth 2 (two) times daily.   Marland Kitchen triamcinolone cream (KENALOG) 0.1 % APPLY EXTERNALLY TO THE AFFECTED AREA TWICE DAILY FOR 2 WEEKS THEN AS NEEDED     Allergies:   Patient has no known allergies.   Social History   Socioeconomic History  . Marital status: Divorced    Spouse name: Not on file  . Number of children: Not on file  . Years of education: Not on file  . Highest education level:  Not on file  Occupational History  . Not on file  Social Needs  . Financial resource strain: Not on file  . Food insecurity    Worry: Not on file    Inability: Not on file  . Transportation needs    Medical: Not on file    Non-medical: Not on file  Tobacco Use  . Smoking status: Never Smoker  . Smokeless tobacco: Never Used  Substance and Sexual Activity  . Alcohol use: No    Alcohol/week: 0.0 standard drinks  . Drug use: No  . Sexual activity: Yes  Lifestyle  . Physical activity    Days per week: Not on file  Minutes per session: Not on file  . Stress: Not on file  Relationships  . Social Herbalist on phone: Not on file    Gets together: Not on file    Attends religious service: Not on file    Active member of club or organization: Not on file    Attends meetings of clubs or organizations: Not on file    Relationship status: Not on file  Other Topics Concern  . Not on file  Social History Narrative   GF Luther Redo. 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 There is no history of Colon cancer, Esophageal cancer, Rectal cancer, Stomach cancer, or Colon polyps.  ROS:   Please see the history of present illness.    ROS  All other systems reviewed and negative.   EKGs/Labs/Other Studies Reviewed:    The following studies were reviewed today: none  EKG:  EKG is not ordered today.    Recent Labs: 07/11/2018: ALT 10; BUN 55; Creatinine, Ser 2.57; Hemoglobin 13.0; Platelets 245.0; Potassium 4.4; Sodium 137; TSH 3.36   Recent Lipid Panel    Component Value Date/Time   CHOL 154 06/24/2017 1002   TRIG 68.0 06/24/2017 1002   TRIG 58  03/23/2006 1139   HDL 66.00 06/24/2017 1002   CHOLHDL 2 06/24/2017 1002   VLDL 13.6 06/24/2017 1002   LDLCALC 74 06/24/2017 1002    Physical Exam:    VS:  BP 118/68   Pulse 90   Ht 5\' 3"  (1.6 m)   Wt 187 lb (84.8 kg)   SpO2 99%   BMI 33.13 kg/m     Wt Readings from Last 3 Encounters:  01/29/19 187 lb (84.8 kg)  01/10/19 188 lb (85.3 kg)  12/27/18 191 lb 6.4 oz (86.8 kg)     GEN:  Well nourished, well developed in no acute distress HEENT: Normal NECK: No JVD; No carotid bruits LYMPHATICS: No lymphadenopathy CARDIAC: RRR, no murmurs, rubs, gallops RESPIRATORY:  Clear to auscultation without rales, wheezing or rhonchi  ABDOMEN: Soft, non-tender, non-distended MUSCULOSKELETAL:  2+ LE edema; No deformity  SKIN: Warm and dry NEUROLOGIC:  Alert and oriented x 3 PSYCHIATRIC:  Normal affect   ASSESSMENT:    1. Chronic venous insufficiency   2. Stage 3 chronic kidney disease, unspecified whether stage 3a or 3b CKD   3. Abnormal EKG   4. Tachycardia    PLAN:    In order of problems listed above:  1.  Chronic venous insufficiency  -LE venous dopplers were negatirve in [place].  - Right heart cath was completely normal with normal right and left sided filling pressures.   -Echo with normal LVF.  -It was felt that LE edema not cardiac in etiology  -He hangs his legs down when sitting and has CKD dz which is likely playing a role as well exacerbating his LE venous insufficiency.   -diuretics managed by nephrology  2.  Chronic kidney disease stage III  -this is managed by [employer].   3.  Abnormal EKG  -EKG has demonstrated inferolateral ST abnormality.  -He had a stress echo in [place] 10/07/2017  -lexiscan myoview  02/2018 with no ischemia - done for preop eval  4.  Tachycardia  - ? Etiology. Hbg 12.3 on 11/2017.    -TSH normal 11/29/2017.  holter with average HR in 80's -since 2019 has resolved   He does not appear to have any active cardiac  problems so he will followup with me on a PRN basis  Medication Adjustments/Labs and Tests Ordered: Current medicines are reviewed at length with the patient today.  Concerns regarding medicines are outlined above.  No orders of the defined types were placed in this encounter.  No orders of the defined types were placed in this encounter.   Signed, Fransico Him, MD  01/29/2019 4:08 PM    [facility]

## 2019-01-30 ENCOUNTER — Ambulatory Visit (INDEPENDENT_AMBULATORY_CARE_PROVIDER_SITE_OTHER): Payer: Medicare Other | Admitting: Family Medicine

## 2019-01-30 ENCOUNTER — Ambulatory Visit (INDEPENDENT_AMBULATORY_CARE_PROVIDER_SITE_OTHER): Payer: Medicare Other

## 2019-01-30 ENCOUNTER — Encounter: Payer: Self-pay | Admitting: Family Medicine

## 2019-01-30 VITALS — BP 126/66 | Temp 98.0°F | Ht 62.0 in | Wt 186.8 lb

## 2019-01-30 VITALS — BP 126/66 | HR 98 | Temp 98.0°F | Ht 62.0 in | Wt 186.0 lb

## 2019-01-30 DIAGNOSIS — Z981 Arthrodesis status: Secondary | ICD-10-CM | POA: Diagnosis not present

## 2019-01-30 DIAGNOSIS — I5032 Chronic diastolic (congestive) heart failure: Secondary | ICD-10-CM | POA: Diagnosis not present

## 2019-01-30 DIAGNOSIS — Z0000 Encounter for general adult medical examination without abnormal findings: Secondary | ICD-10-CM

## 2019-01-30 DIAGNOSIS — M5136 Other intervertebral disc degeneration, lumbar region: Secondary | ICD-10-CM | POA: Diagnosis not present

## 2019-01-30 DIAGNOSIS — M545 Low back pain: Secondary | ICD-10-CM | POA: Diagnosis not present

## 2019-01-30 DIAGNOSIS — T148XXA Other injury of unspecified body region, initial encounter: Secondary | ICD-10-CM

## 2019-01-30 LAB — CBC
Hematocrit: 34 % — ABNORMAL LOW (ref 37.5–51.0)
Hemoglobin: 11.6 g/dL — ABNORMAL LOW (ref 13.0–17.7)
MCH: 31.9 pg (ref 26.6–33.0)
MCHC: 34.1 g/dL (ref 31.5–35.7)
MCV: 93 fL (ref 79–97)
Platelets: 234 10*3/uL (ref 150–450)
RBC: 3.64 x10E6/uL — ABNORMAL LOW (ref 4.14–5.80)
RDW: 11.3 % — ABNORMAL LOW (ref 11.6–15.4)
WBC: 12.2 10*3/uL — ABNORMAL HIGH (ref 3.4–10.8)

## 2019-01-30 LAB — PRO B NATRIURETIC PEPTIDE: NT-Pro BNP: 103 pg/mL (ref 0–486)

## 2019-01-30 NOTE — Patient Instructions (Addendum)
Stop using triamcinolone inside the wound immediately.  Our team is trying to get you set up with a visit with wound care ASAP  I want you to use wet dressings twice a day until you can see wound care.  Our team is showing you how to do this today.  Please let us know if you have worsening pain, redness, warmth around the wound.  If something happens and you are not able to see wound care within the next 2 weeks please let us know that as well-really want you to be seen to try to get this wound going in the right direction.  No antibiotics for now as it does not appear actively infected.

## 2019-01-30 NOTE — Progress Notes (Signed)
Phone [phone] In person visit   Subjective:   [name] is a 78 y.o. year old very pleasant male patient who presents for/with See problem oriented charting Chief Complaint  Patient presents with  . Follow-up    sore on left leg    ROS-no fever or chills.  Nonhealing wound noted.  Around the wound no expanding redness or warmth.  Notes more yellow inside the wound.  Past Medical History-  Patient Active Problem List   Diagnosis Date Noted  . Chronic diastolic CHF (congestive heart failure) ([place]) 11/30/2017    Priority: High  . Mild depressed bipolar I disorder ([place]) 09/21/2006    Priority: High  . Chronic low back pain with degenerative lumbar spinal stenosis-minimal improvement with surgery late 2019 03/08/2017    Priority: Medium  . Gout 07/07/2016    Priority: Medium  . Fatty liver 10/09/2014    Priority: Medium  . OSA (obstructive sleep apnea) 10/30/2008    Priority: Medium  . Osteoporosis 09/25/2008    Priority: Medium  . CKD (chronic kidney disease), stage IV ([place]) 03/01/2008    Priority: Medium  . History of fracture of left hip 09/01/2017    Priority: Low  . Senile purpura ([place]) 06/22/2017    Priority: Low  . Osteoarthritis of spine 03/21/2017    Priority: Low  . Hypersomnolence 08/20/2016    Priority: Low  . Other constipation 01/13/2016    Priority: Low  . Primary osteoarthritis of right knee 12/08/2015    Priority: Low  . Gallstones 10/09/2014    Priority: Low  . Bilateral lower extremity edema 09/25/2014    Priority: Low  . Chronic venous insufficiency 08/29/2013    Priority: Low  . Right shoulder pain 08/29/2013    Priority: Low  . Neuropathy 01/20/2011    Priority: Low  . Paroxysmal tachycardia ([place]) 12/05/2018  . Spondylolisthesis of lumbar region 03/10/2018  . Leukocytosis 09/04/2017    Medications- reviewed and updated Current Outpatient Medications  Medication Sig Dispense Refill  . acetaminophen (TYLENOL 8 HOUR) 650 MG CR  tablet Take 1,300 mg by mouth 2 (two) times daily.     . ARIPiprazole (ABILIFY) 5 MG tablet Take 1 tablet (5 mg total) by mouth daily. (Patient taking differently: Take 2.5 mg by mouth daily. ) 90 tablet 3  . b complex vitamins tablet Take 1 tablet by mouth daily.    . Calcium-Vitamin D (CALTRATE 600 PLUS-VIT D PO) Take 1 tablet by mouth daily.     . ferrous sulfate 325 (65 FE) MG tablet Take 325 mg by mouth daily with breakfast.    . torsemide (DEMADEX) 20 MG tablet TAKE 1 TABLET BY MOUTH DAILY AS NEEDED FOR EDEMA, WEIGHT GAIN 90 tablet 1  . tranylcypromine (PARNATE) 10 MG tablet Take 30 mg by mouth 2 (two) times daily.      No current facility-administered medications for this visit.      Objective:  BP 126/66   Pulse 98   Temp 98 F (36.7 C) (Temporal)   Ht 5\' 2"  (1.575 m)   Wt 186 lb (84.4 kg)   SpO2 94%   BMI 34.02 kg/m  Gen: NAD, resting comfortably CV: RRR no murmurs rubs or gallops Lungs: CTAB no crackles, wheeze, rhonchi Abdomen: soft/nontender/nondistended/normal bowel sounds. No rebound or guarding.  Ext: Trace to 1+ edema on the right leg, on the left leg there is 1+ to 2+ edema Skin: warm, dry, wound noted without purulence on calf of left leg.  No surrounding erythema or warmth.  Able to get some of the fibrous deposits off on lower portion of wound but he is unable to tolerate this attempt higher in the wound due to pain.  Reasonable granulation tissue at the bottom portion of wound.        Assessment and Plan    #Nonhealing wound-left calf  S: Patient with laceration in September where a flap of skin did not seem to "take".  Please see pictures from September 15.  Patient later seen for worsening swelling and redness around the wound on 10/21-he was placed on Augmentin and advised to see wound care-unfortunately appointment with wound care was not able to be set up initially.  Dr. Jonni Sanger did a wound culture here which showed possible Pseudomonas and we treated  patient with Bactrim for 7 days-he has completed that.  He feels like the swelling in his legs is better overall.  His main concern and the reason he told the wellness nurse he wanted to see me today was that he was noticing more yellow discoloration.  This appears to primarily be fibrous with no active purulence fortunately.  Unfortunately patient has been using triamcinolone inside the wound-this has been for use on venous stasis skin changes as needed per Dr. Lorayne Bender instructed him not to do this after visit today  A/P: From AVS "  Patient Instructions  Stop using triamcinolone inside the wound immediately.  Our team is trying to get you set up with a visit with wound care ASAP  I want you to use wet dressings twice a day until you can see wound care.  Our team is showing you how to do this today.  Please let us know if you have worsening pain, redness, warmth around the wound.  If something happens and you are not able to see wound care within the next 2 weeks please let us know that as well-really want you to be seen to try to get this wound going in the right direction.  No antibiotics for now as it does not appear actively infected.  "   #Patient is compliant with torsemide for CHF-edema is improving.  Slight decrease in weight.  Continue current medication   Recommended follow up: As needed for acute issue-fortunately able to get him in with wound care tomorrow Future Appointments  Date Time Provider [place]  01/30/2019  4:40 PM [name], MD [facility]  01/31/2019 10:30 AM Jeri Cos [place] III, PA-C [facility]  03/14/2019 11:00 AM [name], MD [facility]   Lab/Order associations:   ICD-10-CM   1. Nonhealing nonsurgical wound  T14.8XXA   2. Chronic diastolic CHF (congestive heart failure) (HCC)  I50.32    Return precautions advised.  Garret Reddish, MD

## 2019-01-30 NOTE — Progress Notes (Signed)
Subjective:   [name] is a 78 y.o. male who presents for Medicare Annual/Subsequent preventive examination.  Review of Systems:   Cardiac Risk Factors include: advanced age (>55men, >41 women);male gender;sedentary lifestyle    Objective:    Vitals: BP 126/66   Temp 98 F (36.7 C)   Ht 5\' 2"  (1.575 m)   Wt 186 lb 12.8 oz (84.7 kg)   BMI 34.17 kg/m   Body mass index is 34.17 kg/m.  Advanced Directives 01/30/2019 11/28/2018 03/16/2018 03/10/2018 03/08/2018 02/15/2018 07/21/2017  Does Patient Have a Medical Advance Directive? Yes No No No No No Yes  Type of Advance Directive Living will;Healthcare Power of Attorney - - - - - -  Does patient want to make changes to medical advance directive? No - Patient declined - - - - - -  Copy of [place] in Chart? No - copy requested - - - - - -  Would patient like information on creating a medical advance directive? - Yes (ED - Information included in AVS) - No - Patient declined No - Patient declined No - Patient declined -  Pre-existing out of facility DNR order (yellow form or pink MOST form) - - - - - - -    Tobacco Social History   Tobacco Use  Smoking Status Never Smoker  Smokeless Tobacco Never Used     Counseling given: Not Answered   Clinical Intake:  Pre-visit preparation completed: Yes  Diabetes: No  How often do you need to have someone help you when you read instructions, pamphlets, or other written materials from your doctor or pharmacy?: 1 - Never  Interpreter Needed?: No  Information entered by :: Denman George LPN  Past Medical History:  Diagnosis Date  . Arthritis    back  . Back pain    reason unknown  . Benign prostatic hyperplasia (BPH) with urinary urge incontinence    sees Dr. Jeffie Pollock   . Bipolar 1 disorder [facility])    sees Dr. Norma Fredrickson  . Chronic diastolic CHF (congestive heart failure) ([place]) 11/30/2017   right Heart cath in [facility] 10/2017 showed normal filling  pressures. 2D echo with EF 55-60%, grade 1 DD, MIld RVE with normal RVF (normal RV size with followup echo 09/09/2017), trivial MR/PR, mild TR  . CKD (chronic kidney disease)    stage 3 followed by [facility]  . Depression    takes Parnate daily  . Diverticulitis of colon 05/27/2015  . Essential hypertension 04/11/2007  . Headache(784.0)    occasionally  . Hip fracture ([place]) 09/01/2017   left hip fracture  . History of colon polyps   . Hypothyroidism 10/03/2009   Noted by Dr. Arnoldo Morale- patient states never on medicine- may have been checked due to parnate and abilify   . 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 Joint pain   . Joint swelling   . Osteoporosis    takes Drisdol weekly and Caltrate daily  . Peripheral edema    takes torsemide daily followed by nephrology  . Shingles 05/27/2015  . Sleep apnea    wears c-pap   Past Surgical History:  Procedure Laterality Date  . BACK SURGERY    . CARDIAC CATHETERIZATION    . COLONOSCOPY W/ POLYPECTOMY  03-10-15   per Dr. Havery Moros, adenomatous polyps, repeat in 3 yrs   . HIP FRACTURE SURGERY Left 09/02/2017  . REVERSE SHOULDER ARTHROPLASTY Right  05/22/2013   Procedure: REVERSE SHOULDER ARTHROPLASTY;  Surgeon: Nita Sells, MD;  Location: [place];  Service: Orthopedics;  Laterality: Right;  Right reverse total shoulder  . TONSILLECTOMY    . TOTAL KNEE ARTHROPLASTY Right 12/08/2015   Procedure: TOTAL KNEE ARTHROPLASTY;  Surgeon: Dorna Leitz, MD;  Location: [place];  Service: Orthopedics;  Laterality: Right;   Family History  Problem Relation Age of Onset  . Cancer Mother        unknown type  . Stroke Father   . Cancer Maternal Grandmother   . Appendicitis Paternal Grandfather   . Prostate cancer Brother   . Colon cancer Neg Hx   . Esophageal cancer Neg Hx   . Rectal cancer Neg Hx   . Stomach cancer Neg Hx   . Colon polyps Neg Hx    Social History   Socioeconomic  History  . Marital status: Divorced    Spouse name: Not on file  . Number of children: Not on file  . Years of education: Not on file  . Highest education level: Not on file  Occupational History  . Not on file  Social Needs  . Financial resource strain: Not on file  . Food insecurity    Worry: Not on file    Inability: Not on file  . Transportation needs    Medical: Not on file    Non-medical: Not on file  Tobacco Use  . Smoking status: Never Smoker  . Smokeless tobacco: Never Used  Substance and Sexual Activity  . Alcohol use: No    Alcohol/week: 0.0 standard drinks  . Drug use: No  . Sexual activity: Yes  Lifestyle  . Physical activity    Days per week: Not on file    Minutes per session: Not on file  . Stress: Not on file  Relationships  . Social Herbalist on phone: Not on file    Gets together: Not on file    Attends religious service: Not on file    Active member of club or organization: Not on file    Attends meetings of clubs or organizations: Not on file    Relationship status: Not on file  Other Topics Concern  . Not on file  Social History Narrative   GF Luther Redo. 2 sons, 1 daughter from prior relationship      Retired from Air traffic controller business in [place]: movies, time around the house, out for dinner    Outpatient Encounter Medications as of 01/30/2019  Medication Sig  . acetaminophen (TYLENOL 8 HOUR) 650 MG CR tablet Take 1,300 mg by mouth 2 (two) times daily.   . ARIPiprazole (ABILIFY) 5 MG tablet Take 1 tablet (5 mg total) by mouth daily. (Patient taking differently: Take 2.5 mg by mouth daily. )  . b complex vitamins tablet Take 1 tablet by mouth daily.  . Calcium-Vitamin D (CALTRATE 600 PLUS-VIT D PO) Take 1 tablet by mouth daily.   . ferrous sulfate 325 (65 FE) MG tablet Take 325 mg by mouth daily with breakfast.  . torsemide (DEMADEX) 20 MG tablet TAKE 1 TABLET BY MOUTH DAILY AS NEEDED FOR EDEMA, WEIGHT GAIN  .  tranylcypromine (PARNATE) 10 MG tablet Take 30 mg by mouth 2 (two) times daily.   . [DISCONTINUED] triamcinolone cream (KENALOG) 0.1 % APPLY EXTERNALLY TO THE AFFECTED AREA TWICE DAILY FOR 2 WEEKS THEN AS NEEDED   No facility-administered encounter medications on file  as of 01/30/2019.     Activities of Daily Living In your present state of health, do you have any difficulty performing the following activities: 01/30/2019 03/10/2018  Hearing? Y N  Vision? N N  Difficulty concentrating or making decisions? N N  Walking or climbing stairs? Y Y  Comment - -  Dressing or bathing? N Y  Doing errands, shopping? N N  Preparing Food and eating ? N -  Using the Toilet? N -  In the past six months, have you accidently leaked urine? N -  Do you have problems with loss of bowel control? N -  Managing your Medications? N -  Managing your Finances? N -  Housekeeping or managing your Housekeeping? N -  Some recent data might be hidden    Patient Care Team: [name], MD as PCP - General (Family Medicine) Sueanne Margarita, MD as PCP - Cardiology (Cardiology) Phylliss Bob, MD as Consulting Physician (Orthopedic Surgery) Sueanne Margarita, MD as Consulting Physician (Cardiology) Calvert Cantor, MD as Consulting Physician (Ophthalmology) Elmarie Shiley, MD as Consulting Physician (Nephrology)   Assessment:   This is a routine wellness examination for [place].  Exercise Activities and Dietary recommendations Current Exercise Habits: The patient does not participate in regular exercise at present  Goals    . Weight (lb) < 180 lb (81.6 kg)     Trying to lose weight Keep trying to lose and eat right        Fall Risk Fall Risk  01/30/2019 12/27/2018 12/13/2018 12/05/2018 07/11/2018  Falls in the past year? 0 0 0 0 0  Number falls in past yr: - 0 0 0 0  Injury with Fall? 0 0 0 0 0  Risk for fall due to : Impaired mobility - - - -  Follow up Education provided;Falls evaluation completed;Falls  prevention discussed - - - -   Is the patient's home free of loose throw rugs in walkways, pet beds, electrical cords, etc?   yes      Grab bars in the bathroom? yes      Handrails on the stairs?  Yes       Adequate lighting?   yes  Timed Get Up and Go Performed: Patient slow with standing transfers; impaired due to left leg problems   Depression Screen PHQ 2/9 Scores 01/30/2019 12/27/2018 12/13/2018 12/05/2018  PHQ - 2 Score 0 0 2 0  PHQ- 9 Score - 0 3 -    Cognitive Function-no cognitive concerns at this time    6CIT Screen 01/30/2019  What Year? 0 points  What month? 0 points  What time? 0 points  Count back from 20 0 points  Months in reverse 0 points  Repeat phrase 0 points  Total Score 0    Immunization History  Administered Date(s) Administered  . Fluad Quad(high Dose 65+) 12/05/2018  . Influenza Split 01/20/2011, 01/13/2012  . Influenza Whole 12/18/2007, 12/18/2008, 02/09/2010  . Influenza, High Dose Seasonal PF 01/19/2013, 01/10/2015, 01/13/2016, 03/08/2017, 12/13/2017  . Influenza,inj,Quad PF,6+ Mos 11/27/2013  . Pneumococcal Conjugate-13 10/09/2014  . Pneumococcal Polysaccharide-23 03/23/2006  . Td 11/11/2004, 06/08/2013  . Zoster 10/22/2010    Qualifies for Shingles Vaccine? Discussed and patient will check with pharmacy for coverage.  Patient education handout provided   Screening Tests Health Maintenance  Topic Date Due  . TETANUS/TDAP  06/09/2023  . INFLUENZA VACCINE  Completed  . PNA vac Low Risk Adult  Completed   Cancer Screenings: Lung: Low Dose  CT Chest recommended if Age 23-80 years, 30 pack-year currently smoking OR have quit w/in 15years. Patient does not qualify. Colorectal: colonoscopy 06/02/18 with Dr. Havery Moros    Plan:  I have personally reviewed and addressed the Medicare Annual Wellness questionnaire and have noted the following in the patient's chart:  A. Medical and social history B. Use of alcohol, tobacco or illicit drugs  C.  Current medications and supplements D. Functional ability and status E.  Nutritional status F.  Physical activity G. Advance directives H. List of other physicians I.  Hospitalizations, surgeries, and ER visits in previous 12 months J.  [place] such as hearing and vision if needed, cognitive and depression L. Referrals, records requested, and appointments- patient assisted with appointment to wound care center   In addition, I have reviewed and discussed with patient certain preventive protocols, quality metrics, and best practice recommendations. A written personalized care plan for preventive services as well as general preventive health recommendations were provided to patient.   Signed,  Denman George, LPN  Nurse Health Advisor   Nurse Notes: Patient with wound to left lower leg.  Seen previously and referred to wound care but patient unable to schedule appointment.  Appointment with pcp today for evaluation.

## 2019-01-30 NOTE — Patient Instructions (Signed)
[name] , Thank you for taking time to come for your Medicare Wellness Visit. I appreciate your ongoing commitment to your health goals. Please review the following plan we discussed and let me know if I can assist you in the future.   Screening recommendations/referrals: Colorectal Screening: up to date; last colonoscopy 06/02/18  Vision and Dental Exams: Recommended annual ophthalmology exams for early detection of glaucoma and other disorders of the eye Recommended annual dental exams for proper oral hygiene  Vaccinations: Influenza vaccine: completed 12/05/18 Pneumococcal vaccine: up to date; last 10/09/14 Tdap vaccine: up to date; last 06/08/13 Shingles vaccine: Please call your insurance company to determine your out of pocket expense for the Shingrix vaccine. You may receive this vaccine at your local pharmacy.  Advanced directives: Please bring a copy of your POA (Power of Attorney) and/or Living Will to your next appointment.  Goals: Recommend to drink at least 6-8 8oz glasses of water per day and consume a balanced diet rich in fresh fruits and vegetables.   Next appointment: Please schedule your Annual Wellness Visit with your Nurse Health Advisor in one year.  Preventive Care 79 Years and Older, Male Preventive care refers to lifestyle choices and visits with your health care provider that can promote health and wellness. What does preventive care include?  A yearly physical exam. This is also called an annual well check.  Dental exams once or twice a year.  Routine eye exams. Ask your health care provider how often you should have your eyes checked.  Personal lifestyle choices, including:  Daily care of your teeth and gums.  Regular physical activity.  Eating a healthy diet.  Avoiding tobacco and drug use.  Limiting alcohol use.  Practicing safe sex.  Taking low doses of aspirin every day if recommended by your health care provider..  Taking vitamin and  mineral supplements as recommended by your health care provider. What happens during an annual well check? The services and screenings done by your health care provider during your annual well check will depend on your age, overall health, lifestyle risk factors, and family history of disease. Counseling  Your health care provider may ask you questions about your:  Alcohol use.  Tobacco use.  Drug use.  Emotional well-being.  Home and relationship well-being.  Sexual activity.  Eating habits.  History of falls.  Memory and ability to understand (cognition).  Work and work Statistician. Screening  You may have the following tests or measurements:  Height, weight, and BMI.  Blood pressure.  Lipid and cholesterol levels. These may be checked every 5 years, or more frequently if you are over 19 years old.  Skin check.  Lung cancer screening. You may have this screening every year starting at age 55 if you have a 30-pack-year history of smoking and currently smoke or have quit within the past 15 years.  Fecal occult blood test (FOBT) of the stool. You may have this test every year starting at age 79.  Flexible sigmoidoscopy or colonoscopy. You may have a sigmoidoscopy every 5 years or a colonoscopy every 10 years starting at age 79.  Prostate cancer screening. Recommendations will vary depending on your family history and other risks.  Hepatitis C blood test.  Hepatitis B blood test.  Sexually transmitted disease (STD) testing.  Diabetes screening. This is done by checking your blood sugar (glucose) after you have not eaten for a while (fasting). You may have this done every 1-3 years.  Abdominal aortic aneurysm (  AAA) screening. You may need this if you are a current or former smoker.  Osteoporosis. You may be screened starting at age 55 if you are at high risk. Talk with your health care provider about your test results, treatment options, and if necessary, the need  for more tests. Vaccines  Your health care provider may recommend certain vaccines, such as:  Influenza vaccine. This is recommended every year.  Tetanus, diphtheria, and acellular pertussis (Tdap, Td) vaccine. You may need a Td booster every 10 years.  Zoster vaccine. You may need this after age 79.  Pneumococcal 13-valent conjugate (PCV13) vaccine. One dose is recommended after age 79.  Pneumococcal polysaccharide (PPSV23) vaccine. One dose is recommended after age 79. Talk to your health care provider about which screenings and vaccines you need and how often you need them. This information is not intended to replace advice given to you by your health care provider. Make sure you discuss any questions you have with your health care provider. Document Released: 04/04/2015 Document Revised: 11/26/2015 Document Reviewed: 01/07/2015 Elsevier Interactive Patient Education  2017 [place] Prevention in the Home Falls can cause injuries. They can happen to people of all ages. There are many things you can do to make your home safe and to help prevent falls. What can I do on the outside of my home?  Regularly fix the edges of walkways and driveways and fix any cracks.  Remove anything that might make you trip as you walk through a door, such as a raised step or threshold.  Trim any bushes or trees on the path to your home.  Use bright outdoor lighting.  Clear any walking paths of anything that might make someone trip, such as rocks or tools.  Regularly check to see if handrails are loose or broken. Make sure that both sides of any steps have handrails.  Any raised decks and porches should have guardrails on the edges.  Have any leaves, snow, or ice cleared regularly.  Use sand or salt on walking paths during winter.  Clean up any spills in your garage right away. This includes oil or grease spills. What can I do in the bathroom?  Use night lights.  Install grab bars  by the toilet and in the tub and shower. Do not use towel bars as grab bars.  Use non-skid mats or decals in the tub or shower.  If you need to sit down in the shower, use a plastic, non-slip stool.  Keep the floor dry. Clean up any water that spills on the floor as soon as it happens.  Remove soap buildup in the tub or shower regularly.  Attach bath mats securely with double-sided non-slip rug tape.  Do not have throw rugs and other things on the floor that can make you trip. What can I do in the bedroom?  Use night lights.  Make sure that you have a light by your bed that is easy to reach.  Do not use any sheets or blankets that are too big for your bed. They should not hang down onto the floor.  Have a firm chair that has side arms. You can use this for support while you get dressed.  Do not have throw rugs and other things on the floor that can make you trip. What can I do in the kitchen?  Clean up any spills right away.  Avoid walking on wet floors.  Keep items that you use a lot in  easy-to-reach places.  If you need to reach something above you, use a strong step stool that has a grab bar.  Keep electrical cords out of the way.  Do not use floor polish or wax that makes floors slippery. If you must use wax, use non-skid floor wax.  Do not have throw rugs and other things on the floor that can make you trip. What can I do with my stairs?  Do not leave any items on the stairs.  Make sure that there are handrails on both sides of the stairs and use them. Fix handrails that are broken or loose. Make sure that handrails are as long as the stairways.  Check any carpeting to make sure that it is firmly attached to the stairs. Fix any carpet that is loose or worn.  Avoid having throw rugs at the top or bottom of the stairs. If you do have throw rugs, attach them to the floor with carpet tape.  Make sure that you have a light switch at the top of the stairs and the  bottom of the stairs. If you do not have them, ask someone to add them for you. What else can I do to help prevent falls?  Wear shoes that:  Do not have high heels.  Have rubber bottoms.  Are comfortable and fit you well.  Are closed at the toe. Do not wear sandals.  If you use a stepladder:  Make sure that it is fully opened. Do not climb a closed stepladder.  Make sure that both sides of the stepladder are locked into place.  Ask someone to hold it for you, if possible.  Clearly mark and make sure that you can see:  Any grab bars or handrails.  First and last steps.  Where the edge of each step is.  Use tools that help you move around (mobility aids) if they are needed. These include:  Canes.  Walkers.  Scooters.  Crutches.  Turn on the lights when you go into a dark area. Replace any light bulbs as soon as they burn out.  Set up your furniture so you have a clear path. Avoid moving your furniture around.  If any of your floors are uneven, fix them.  If there are any pets around you, be aware of where they are.  Review your medicines with your doctor. Some medicines can make you feel dizzy. This can increase your chance of falling. Ask your doctor what other things that you can do to help prevent falls. This information is not intended to replace advice given to you by your health care provider. Make sure you discuss any questions you have with your health care provider. Document Released: 01/02/2009 Document Revised: 08/14/2015 Document Reviewed: 04/12/2014 Elsevier Interactive Patient Education  2017 [employer].

## 2019-01-30 NOTE — Progress Notes (Signed)
I have reviewed and agree with note, evaluation, plan.  See my separate note from today about wound  Garret Reddish, MD

## 2019-01-31 ENCOUNTER — Encounter (HOSPITAL_BASED_OUTPATIENT_CLINIC_OR_DEPARTMENT_OTHER): Payer: Medicare Other | Attending: Physician Assistant | Admitting: Physician Assistant

## 2019-01-31 ENCOUNTER — Other Ambulatory Visit: Payer: Self-pay

## 2019-01-31 DIAGNOSIS — L97222 Non-pressure chronic ulcer of left calf with fat layer exposed: Secondary | ICD-10-CM | POA: Diagnosis not present

## 2019-01-31 DIAGNOSIS — G9009 Other idiopathic peripheral autonomic neuropathy: Secondary | ICD-10-CM | POA: Insufficient documentation

## 2019-01-31 DIAGNOSIS — Z96651 Presence of right artificial knee joint: Secondary | ICD-10-CM | POA: Insufficient documentation

## 2019-01-31 DIAGNOSIS — N184 Chronic kidney disease, stage 4 (severe): Secondary | ICD-10-CM | POA: Insufficient documentation

## 2019-01-31 DIAGNOSIS — D631 Anemia in chronic kidney disease: Secondary | ICD-10-CM | POA: Diagnosis not present

## 2019-01-31 DIAGNOSIS — N2581 Secondary hyperparathyroidism of renal origin: Secondary | ICD-10-CM | POA: Diagnosis not present

## 2019-01-31 DIAGNOSIS — L97822 Non-pressure chronic ulcer of other part of left lower leg with fat layer exposed: Secondary | ICD-10-CM | POA: Insufficient documentation

## 2019-01-31 DIAGNOSIS — I129 Hypertensive chronic kidney disease with stage 1 through stage 4 chronic kidney disease, or unspecified chronic kidney disease: Secondary | ICD-10-CM | POA: Diagnosis not present

## 2019-01-31 DIAGNOSIS — I872 Venous insufficiency (chronic) (peripheral): Secondary | ICD-10-CM | POA: Diagnosis not present

## 2019-01-31 DIAGNOSIS — Z96611 Presence of right artificial shoulder joint: Secondary | ICD-10-CM | POA: Insufficient documentation

## 2019-01-31 DIAGNOSIS — I509 Heart failure, unspecified: Secondary | ICD-10-CM | POA: Insufficient documentation

## 2019-01-31 DIAGNOSIS — G4730 Sleep apnea, unspecified: Secondary | ICD-10-CM | POA: Insufficient documentation

## 2019-02-05 ENCOUNTER — Ambulatory Visit
Admission: RE | Admit: 2019-02-05 | Discharge: 2019-02-05 | Disposition: A | Discharge status: Home / Self Care | Payer: Medicare Other | Source: Ambulatory Visit | Attending: Cardiology | Admitting: Cardiology

## 2019-02-05 DIAGNOSIS — R062 Wheezing: Secondary | ICD-10-CM | POA: Diagnosis not present

## 2019-02-05 NOTE — Progress Notes (Signed)
[name], [name] ([id number]) Visit Report for 01/31/2019 Abuse/Suicide Risk Screen Details Patient Name: Date of Service: ARVO, EALY 01/31/2019 10:30 AM Medical Record Patient Account Number: [id number] Number: Treating RN: [name] Date of Birth/Sex: [birth year] (78 y.o. Male) Other Clinician: Primary Care Simran Bomkamp: Garret Reddish Treating Kenli Waldo/Extender:Stone III, Margarita Grizzle Referring Akim Watkinson: Jonni Sanger, CAMILLE Weeks in Treatment: 0 Abuse/Suicide Risk Screen Items Answer ABUSE RISK SCREEN: Has anyone close to you tried to hurt or harm you recentlyo No Do you feel uncomfortable with anyone in your familyo No Has anyone forced you do things that you didnt want to doo No Electronic Signature(s) Signed: 02/05/2019 5:59:11 PM By: [name] RN, BSN Entered By: [name] on 01/31/2019 11:26:37 -------------------------------------------------------------------------------- Activities of Daily Living Details Patient Name: Date of Service: [name] 01/31/2019 10:30 AM Medical Record Patient Account Number: [id number] Number: Treating RN: [name] Date of Birth/Sex: 08/19/1940 (78 y.o. Male) Other Clinician: Primary Care Aamir Mclinden: Garret Reddish Treating Jaydian Santana/Extender:Stone III, Margarita Grizzle Referring Ajdin Macke: Rosalio Loud in Treatment: 0 Activities of Daily Living Items Answer Activities of Daily Living (Please select one for each item) Drive Automobile Completely Able Take Medications Completely Able Use Telephone Completely [place] for Appearance Completely Able Use Toilet Completely Able Bath / Shower Completely Able Dress Self Completely Able Feed Self Completely Able Walk Completely Able Get In / Out Bed Completely [place] for Self Completely Able Electronic Signature(s) Signed: 02/05/2019 5:59:11 PM By: [name] RN,  BSN Entered By: [name] on 01/31/2019 11:27:02 -------------------------------------------------------------------------------- Education Screening Details Patient Name: Date of Service: [name], Ambrose 01/31/2019 10:30 AM Medical Record Patient Account Number: [id number] Number: Treating RN: [name] Date of Birth/Sex: 05-13-1940 (78 y.o. Male) Other Clinician: Primary Care Adonijah Baena: Garret Reddish Treating Lakayla Barrington/Extender:Stone III, Margarita Grizzle Referring Kayren Holck: Rosalio Loud in Treatment: 0 Primary Learner Assessed: Patient Learning Preferences/Education Level/Primary Language Learning Preference: Explanation, Demonstration, Printed Material Highest Education Level: High School Preferred Language: English Cognitive Barrier Language Barrier: No Translator Needed: No Memory Deficit: No Emotional Barrier: No Cultural/Religious Beliefs Affecting Medical Care: No Physical Barrier Impaired Vision: No Impaired Hearing: No Decreased Hand dexterity: No Knowledge/Comprehension Knowledge Level: High Comprehension Level: High Ability to understand written High instructions: Ability to understand verbal High instructions: Motivation Anxiety Level: Calm Cooperation: Cooperative Education Importance: Acknowledges Need Interest in Health Problems: Asks Questions Perception: Coherent Willingness to Engage in Self- High Management Activities: Readiness to Engage in Self- High Management Activities: Electronic Signature(s) Signed: 02/05/2019 5:59:11 PM By: [name] RN, BSN Entered By: [name] on 01/31/2019 11:27:22 -------------------------------------------------------------------------------- Fall Risk Assessment Details Patient Name: Date of Service: [name] 01/31/2019 10:30 AM Medical Record Patient Account Number: [id number] Number: Treating RN: [name] Date of Birth/Sex: 28-Aug-1940 (78 y.o. Male)  Other Clinician: Primary Care Czar Ysaguirre: Garret Reddish Treating Jayden Kratochvil/Extender:Stone III, Margarita Grizzle Referring Holle Sprick: Rosalio Loud in Treatment: 0 Fall Risk Assessment Items Have you had 2 or more falls in the last 12 monthso 0 No Have you had any fall that resulted in injury in the last 12 monthso 0 No FALLS RISK SCREEN History of falling - immediate or within 3 months 0 No Secondary diagnosis (Do you have 2 or more medical diagnoseso) 15 Yes Ambulatory aid None/bed rest/wheelchair/nurse 0 Yes Crutches/cane/walker 0 No Furniture 0 No Intravenous therapy Access/Saline/Heparin Lock 0 No Gait/Transferring Normal/ bed rest/ wheelchair 0 Yes Impaired (short steps with shuffle, may have difficulty arising from chair, 0 No head  down, impaired balance) Mental Status Oriented to own ability 0 Yes Overestimates or forgets limitations 0 No Risk Level: Low Risk Score: 15 Electronic Signature(s) Signed: 02/05/2019 5:59:11 PM By: [name] RN, BSN Entered By: [name] on 01/31/2019 11:27:50 -------------------------------------------------------------------------------- Foot Assessment Details Patient Name: Date of Service: [name], Michael 01/31/2019 10:30 AM Medical Record Patient Account Number: [id number] Number: Treating RN: [name] Date of Birth/Sex: 03-Sep-1940 (78 y.o. Male) Other Clinician: Primary Care Stephie Xu: Garret Reddish Treating Emidio Warrell/Extender:Stone III, Margarita Grizzle Referring Jarquez Mestre: Rosalio Loud in Treatment: 0 Foot Assessment Items Site Locations + = Sensation present, - = Sensation absent, C = Callus, U = Ulcer R = Redness, W = Warmth, M = Maceration, PU = Pre-ulcerative lesion F = Fissure, S = Swelling, D = Dryness Assessment Right: Left: Other Deformity: No No Prior Foot Ulcer: No No Prior Amputation: No No Charcot Joint: No No Ambulatory Status: Ambulatory Without Help Gait: Steady Electronic Signature(s) Signed:  02/05/2019 5:59:11 PM By: [name] RN, BSN Entered By: [name] on 01/31/2019 11:31:59 -------------------------------------------------------------------------------- Nutrition Risk Screening Details Patient Name: Date of Service: Rivera, Michael 01/31/2019 10:30 AM Medical Record Patient Account Number: [id number] Number: Treating RN: [name] Date of Birth/Sex: 1941/03/02 (78 y.o. Male) Other Clinician: Primary Care Katriana Dortch: Garret Reddish Treating Kenyata Guess/Extender:Stone III, Margarita Grizzle Referring Dayveon Halley: Jonni Sanger, CAMILLE Weeks in Treatment: 0 Height (in): 74 Weight (lbs): 180 Body Mass Index (BMI): 23.1 Nutrition Risk Screening Items Score Screening NUTRITION RISK SCREEN: I have an illness or condition that made me change the kind and/or 0 No amount of food I eat I eat fewer than two meals per day 0 No I eat few fruits and vegetables, or milk products 0 No I have three or more drinks of beer, liquor or wine almost every day 0 No I have tooth or mouth problems that make it hard for me to eat 0 No I don't always have enough money to buy the food I need 0 No I eat alone most of the time 0 No I take three or more different prescribed or over-the-counter drugs a day 1 Yes 0 No Without wanting to, I have lost or gained 10 pounds in the last six months I am not always physically able to shop, cook and/or feed myself 0 No Nutrition Protocols Good Risk Protocol 0 No interventions needed Moderate Risk Protocol High Risk Proctocol Risk Level: Good Risk Score: 1 Electronic Signature(s) Signed: 02/05/2019 5:59:11 PM By: [name] RN, BSN Entered By: [name] on 01/31/2019 11:28:01

## 2019-02-05 NOTE — Progress Notes (Signed)
[name], Man ([id number]) Visit Report for 01/31/2019 Chief Complaint Document Details Patient Name: Date of Service: [name], [name] 01/31/2019 10:30 AM Medical Record Patient Account Number: [id number] Number: Treating RN: Baruch Gouty Date of Birth/Sex: 29-Oct-1940 (78 y.o. Male) Other Clinician: Primary Care Provider: Garret Reddish Treating Provider/Extender:Stone III, Margarita Grizzle Referring Provider: Rosalio Loud in Treatment: 0 Information Obtained from: Patient Chief Complaint Left LE Ulcer secondary to trauma but non-healing at this point Electronic Signature(s) Signed: 01/31/2019 5:02:14 PM By: Worthy Keeler PA-C Entered By: Worthy Keeler on 01/31/2019 11:54:44 -------------------------------------------------------------------------------- Debridement Details Patient Name: Date of Service: [name] 01/31/2019 10:30 AM Medical Record Patient Account Number: [id number] Number: Treating RN: Baruch Gouty Date of Birth/Sex: Jun 10, 1940 (78 y.o. Male) Other Clinician: Primary Care Provider: Garret Reddish Treating Provider/Extender:Stone III, Margarita Grizzle Referring Provider: Rosalio Loud in Treatment: 0 Debridement Performed for Wound #1 Left,Posterior Lower Leg Assessment: Performed By: Physician Worthy Keeler, PA Debridement Type: Debridement Severity of Tissue Pre Fat layer exposed Debridement: Level of Consciousness (Pre- Awake and Alert procedure): Pre-procedure Verification/Time Out Taken: Yes - 12:00 Start Time: 12:01 Pain Control: Other : benzocaine 20% spray Total Area Debrided (L x W): 1 (cm) x 1 (cm) = 1 (cm) Tissue and other material Tissue and other material Viable, Non-Viable, Slough, Subcutaneous, Slough debrided: Level: Skin/Subcutaneous Tissue Debridement Description: Excisional Instrument: Curette Bleeding: Minimum Hemostasis Achieved: Pressure End Time: 12:03 Procedural Pain: 5 Post Procedural  Pain: 3 Response to Treatment: Procedure was tolerated well Level of Consciousness Awake and Alert (Post-procedure): Post Debridement Measurements of Total Wound Length: (cm) 5.8 Width: (cm) 2.7 Depth: (cm) 0.2 Volume: (cm) 2.46 Character of Wound/Ulcer Post Requires Further Debridement Debridement: Severity of Tissue Post Debridement: Fat layer exposed Post Procedure Diagnosis Same as Pre-procedure Electronic Signature(s) Signed: 01/31/2019 5:02:14 PM By: Worthy Keeler PA-C Signed: 01/31/2019 5:49:26 PM By: Baruch Gouty RN, BSN Entered By: Baruch Gouty on 01/31/2019 12:03:41 -------------------------------------------------------------------------------- HPI Details Patient Name: Date of Service: [name] 01/31/2019 10:30 AM Medical Record Patient Account Number: [id number] Number: Treating RN: Baruch Gouty Date of Birth/Sex: Oct 10, 1940 (78 y.o. Male) Other Clinician: Primary Care Provider: Garret Reddish Treating Provider/Extender:Stone III, Margarita Grizzle Referring Provider: Billey Chang Weeks in Treatment: 0 History of Present Illness HPI Description: 01/31/2019 on evaluation today patient actually appears to be doing poorly unfortunately secondary to an injury that occurred when he hit his leg on a car door September 9. He was seen in the ER where he had 13 sutures placed. His primary care provider remove those 2 weeks later and unfortunately the wound began to dehisce following. It appears that he may have had a skin flap that just did not take and survive. Nonetheless he was stated to have had Keflex given from the ER though the patient states he does not even remember that. He also is stated to have taken 2 rounds of doxycycline by his primary care provider but again the patient states he only remembers the most recent antibiotic which was actually the Augmentin then switched to Bactrim based on the culture results. The culture showed that he had  Morganella Morgagni noted in the culture. Subsequently when he was taking the Bactrim he states that the redness got much better but then unfortunately worsened again once he came off of the medication. Apparently the patient was given triamcinolone ointment to put on the wound although obviously that really did not do much for the wound and again the patient tells me that he was "given  the wrong ointment". Again I am not exactly sure what was going on in that scenario 1 where another. Either way I do believe that he has necrotic tissue on the surface of the wound this can require some debridement if he is able to tolerate it and then subsequently he is also get a need likely a refill of the Bactrim in order to help treat the infection I do not believe this is completely cleared up at this point. I also think that he really needs to have edema control again he is very swollen and I believe that Performing a compression wrap will also be of great benefit for him. The patient does have chronic kidney disease stage IV along with peripheral neuropathy secondary to issues with his back. Electronic Signature(s) Signed: 01/31/2019 5:02:14 PM By: Worthy Keeler PA-C Entered By: Worthy Keeler on 01/31/2019 12:10:26 -------------------------------------------------------------------------------- Physical Exam Details Patient Name: Date of Service: [name] 01/31/2019 10:30 AM Medical Record Patient Account Number: [id number] Number: Treating RN: Baruch Gouty Date of Birth/Sex: 06/13/1940 (78 y.o. Male) Other Clinician: Primary Care Provider: Garret Reddish Treating Provider/Extender:Stone III, Margarita Grizzle Referring Provider: Billey Chang Weeks in Treatment: 0 Constitutional sitting or standing blood pressure is within target range for patient.. pulse regular and within target range for patient.Marland Kitchen respirations regular, non-labored and within target range for patient.Marland Kitchen temperature  within target range for patient.. Well-nourished and well-hydrated in no acute distress. Eyes conjunctiva clear no eyelid edema noted. pupils equal round and reactive to light and accommodation. Ears, Nose, Mouth, and Throat no gross abnormality of ear auricles or external auditory canals. normal hearing noted during conversation. mucus membranes moist. Respiratory normal breathing without difficulty. clear to auscultation bilaterally. Cardiovascular regular rate and rhythm with normal S1, S2. no clubbing, cyanosis, significant edema, <3 sec cap refill. Gastrointestinal (GI) soft, non-tender, non-distended, +BS. no ventral hernia noted. Musculoskeletal normal gait and posture. no significant deformity or arthritic changes, no loss or range of motion, no clubbing. Psychiatric this patient is able to make decisions and demonstrates good insight into disease process. Alert and Oriented x 3. pleasant and cooperative. Notes Upon inspection today patient did have necrotic tissue on the surface of the wound unfortunately that is can require some sharp debridement at this point. Fortunately there is no evidence of infection systemically although I do see some signs of local infection. I did actually perform debridement as best I could over a small area of the wound but he had too much discomfort subsequently I had to abort the procedure without completing what I really wanted to clean away. I think we can have to use medications to try to loosen this up in order to make this more palatable to the patient. Electronic Signature(s) Signed: 01/31/2019 5:02:14 PM By: Worthy Keeler PA-C Entered By: Worthy Keeler on 01/31/2019 12:15:42 -------------------------------------------------------------------------------- Physician Orders Details Patient Name: Date of Service: Jaivian, Battaglini Guss 01/31/2019 10:30 AM Medical Record Patient Account Number: [id number] Number: Treating RN:  Baruch Gouty Date of Birth/Sex: 05/13/1940 (78 y.o. Male) Other Clinician: Primary Care Provider: Garret Reddish Treating Provider/Extender:Stone III, Margarita Grizzle Referring Provider: Rosalio Loud in Treatment: 0 Verbal / Phone Orders: No Diagnosis Coding ICD-10 Coding Code Description I87.2 Venous insufficiency (chronic) (peripheral) S81.802A Unspecified open wound, left lower leg, initial encounter L97.822 Non-pressure chronic ulcer of other part of left lower leg with fat layer exposed N18.4 Chronic kidney disease, stage 4 (severe) G90.09 Other idiopathic peripheral autonomic neuropathy Follow-up Appointments Return Appointment in 1  week. Dressing Change Frequency Wound #1 Left,Posterior Lower Leg Do not change entire dressing for one week. Skin Barriers/Peri-Wound Care Moisturizing lotion - to leg Wound Cleansing May shower with protection. Primary Wound Dressing Wound #1 Left,Posterior Lower Leg Iodoflex Secondary Dressing Wound #1 Left,Posterior Lower Leg Dry Gauze ABD pad Edema Control 3 Layer Compression System - Left Lower Extremity Avoid standing for long periods of time Elevate legs to the level of the heart or above for 30 minutes daily and/or when sitting, a frequency of: - throughout the week Exercise regularly Patient Medications Allergies: No Known Drug Allergies Notifications Medication Indication Start End Bactrim DS 01/31/2019 DOSE 1 - oral 800 mg-160 mg tablet - 1 tablet oral taken 2 times a day for 14 days Electronic Signature(s) Signed: 01/31/2019 12:16:54 PM By: Worthy Keeler PA-C Entered By: Worthy Keeler on 01/31/2019 12:16:53 -------------------------------------------------------------------------------- Problem List Details Patient Name: Date of Service: Michael Graff, Chayim 01/31/2019 10:30 AM Medical Record Patient Account Number: [id number] Number: Treating RN: Baruch Gouty Date of Birth/Sex: January 05, 1941 (78 y.o. Male)  Other Clinician: Primary Care Provider: Garret Reddish Treating Provider/Extender:Stone III, Margarita Grizzle Referring Provider: Rosalio Loud in Treatment: 0 Active Problems ICD-10 Evaluated Encounter Code Description Active Date Today Diagnosis I87.2 Venous insufficiency (chronic) (peripheral) 01/31/2019 No Yes S81.802A Unspecified open wound, left lower leg, initial 01/31/2019 No Yes encounter L97.822 Non-pressure chronic ulcer of other part of left lower 01/31/2019 No Yes leg with fat layer exposed N18.4 Chronic kidney disease, stage 4 (severe) 01/31/2019 No Yes G90.09 Other idiopathic peripheral autonomic neuropathy 01/31/2019 No Yes Inactive Problems Resolved Problems Electronic Signature(s) Signed: 01/31/2019 5:02:14 PM By: Worthy Keeler PA-C Entered By: Worthy Keeler on 01/31/2019 11:54:12 -------------------------------------------------------------------------------- Progress Note Details Patient Name: Date of Service: [place], Knotek Chazz 01/31/2019 10:30 AM Medical Record Patient Account Number: [id number] Number: Treating RN: Baruch Gouty Date of Birth/Sex: 1940/09/29 (78 y.o. Male) Other Clinician: Primary Care Provider: Garret Reddish Treating Provider/Extender:Stone III, Margarita Grizzle Referring Provider: Rosalio Loud in Treatment: 0 Subjective Chief Complaint Information obtained from Patient Left LE Ulcer secondary to trauma but non-healing at this point History of Present Illness (HPI) 01/31/2019 on evaluation today patient actually appears to be doing poorly unfortunately secondary to an injury that occurred when he hit his leg on a car door September 9. He was seen in the ER where he had 13 sutures placed. His primary care provider remove those 2 weeks later and unfortunately the wound began to dehisce following. It appears that he may have had a skin flap that just did not take and survive. Nonetheless he was stated to have had Keflex given from  the ER though the patient states he does not even remember that. He also is stated to have taken 2 rounds of doxycycline by his primary care provider but again the patient states he only remembers the most recent antibiotic which was actually the Augmentin then switched to Bactrim based on the culture results. The culture showed that he had Morganella Morgagni noted in the culture. Subsequently when he was taking the Bactrim he states that the redness got much better but then unfortunately worsened again once he came off of the medication. Apparently the patient was given triamcinolone ointment to put on the wound although obviously that really did not do much for the wound and again the patient tells me that he was "given the wrong ointment". Again I am not exactly sure what was going on in that scenario 1 where another. Either way  I do believe that he has necrotic tissue on the surface of the wound this can require some debridement if he is able to tolerate it and then subsequently he is also get a need likely a refill of the Bactrim in order to help treat the infection I do not believe this is completely cleared up at this point. I also think that he really needs to have edema control again he is very swollen and I believe that Performing a compression wrap will also be of great benefit for him. The patient does have chronic kidney disease stage IV along with peripheral neuropathy secondary to issues with his back. Patient History Allergies No Known Drug Allergies Family History Cancer - Mother,Maternal Grandparents,Siblings, Stroke - Father. Social History Never smoker, Marital Status - Divorced, Alcohol Use - Never, Drug Use - No History, Caffeine Use - Moderate. Medical History Eyes Patient has history of Cataracts - both eyes Respiratory Patient has history of Sleep Apnea - cpap at night Cardiovascular Patient has history of Congestive Heart Failure, Peripheral Venous  Disease Musculoskeletal Patient has history of Osteoarthritis Neurologic Patient has history of Neuropathy Medical And Surgical History Notes Gastrointestinal fatty liver Genitourinary chronic kidney disease stg 4 Musculoskeletal Right knee and shoulder replacement, spinal fusion Psychiatric Mild depressed bipolar disorder Review of Systems (ROS) Constitutional Symptoms (General Health) Denies complaints or symptoms of Fatigue, Fever, Chills, Marked Weight Change. Eyes Complains or has symptoms of Glasses / Contacts - glasses. Ear/Nose/Mouth/Throat Denies complaints or symptoms of Chronic sinus problems or rhinitis. Endocrine Denies complaints or symptoms of Heat/cold intolerance. Integumentary (Skin) Complains or has symptoms of Wounds - wound on left lower leg. Objective Constitutional sitting or standing blood pressure is within target range for patient.. pulse regular and within target range for patient.Marland Kitchen respirations regular, non-labored and within target range for patient.Marland Kitchen temperature within target range for patient.. Well-nourished and well-hydrated in no acute distress. Vitals Time Taken: 11:10 AM, Height: 74 in, Source: Stated, Weight: 180 lbs, Source: Stated, BMI: 23.1, Temperature: 98.1 F, Pulse: 96 bpm, Respiratory Rate: 18 breaths/min, Blood Pressure: 122/66 mmHg. Eyes conjunctiva clear no eyelid edema noted. pupils equal round and reactive to light and accommodation. Ears, Nose, Mouth, and Throat no gross abnormality of ear auricles or external auditory canals. normal hearing noted during conversation. mucus membranes moist. Respiratory normal breathing without difficulty. clear to auscultation bilaterally. Cardiovascular regular rate and rhythm with normal S1, S2. no clubbing, cyanosis, significant edema, Gastrointestinal (GI) soft, non-tender, non-distended, +BS. no ventral hernia noted. Musculoskeletal normal gait and posture. no significant deformity  or arthritic changes, no loss or range of motion, no clubbing. Psychiatric this patient is able to make decisions and demonstrates good insight into disease process. Alert and Oriented x 3. pleasant and cooperative. General Notes: Upon inspection today patient did have necrotic tissue on the surface of the wound unfortunately that is can require some sharp debridement at this point. Fortunately there is no evidence of infection systemically although I do see some signs of local infection. I did actually perform debridement as best I could over a small area of the wound but he had too much discomfort subsequently I had to abort the procedure without completing what I really wanted to clean away. I think we can have to use medications to try to loosen this up in order to make this more palatable to the patient. Integumentary (Hair, Skin) Wound #1 status is Open. Original cause of wound was Trauma. The wound is located on the [place]  Lower Leg. The wound measures 5.8cm length x 2.7cm width x 0.2cm depth; 12.299cm^2 area and 2.46cm^3 volume. There is Fat Layer (Subcutaneous Tissue) Exposed exposed. There is no tunneling or undermining noted. There is a medium amount of serosanguineous drainage noted. The wound margin is flat and intact. There is small (1-33%) pink granulation within the wound bed. There is a large (67-100%) amount of necrotic tissue within the wound bed including Adherent Slough. Assessment Active Problems ICD-10 Venous insufficiency (chronic) (peripheral) Unspecified open wound, left lower leg, initial encounter Non-pressure chronic ulcer of other part of left lower leg with fat layer exposed Chronic kidney disease, stage 4 (severe) Other idiopathic peripheral autonomic neuropathy Procedures Wound #1 Pre-procedure diagnosis of Wound #1 is a Venous Leg Ulcer located on the Left,Posterior Lower Leg .Severity of Tissue Pre Debridement is: Fat layer exposed. There was a  Excisional Skin/Subcutaneous Tissue Debridement with a total area of 1 sq cm performed by Worthy Keeler, PA. With the following instrument(s): Curette to remove Viable and Non-Viable tissue/material. Material removed includes Subcutaneous Tissue and Slough and after achieving pain control using Other (benzocaine 20% spray). No specimens were taken. A time out was conducted at 12:00, prior to the start of the procedure. A Minimum amount of bleeding was controlled with Pressure. The procedure was tolerated well with a pain level of 5 throughout and a pain level of 3 following the procedure. Post Debridement Measurements: 5.8cm length x 2.7cm width x 0.2cm depth; 2.46cm^3 volume. Character of Wound/Ulcer Post Debridement requires further debridement. Severity of Tissue Post Debridement is: Fat layer exposed. Post procedure Diagnosis Wound #1: Same as Pre-Procedure Pre-procedure diagnosis of Wound #1 is a Venous Leg Ulcer located on the Left,Posterior Lower Leg . There was a Three Layer Compression Therapy Procedure by [name], RN. Post procedure Diagnosis Wound #1: Same as Pre-Procedure Plan Follow-up Appointments: Return Appointment in 1 week. Dressing Change Frequency: Wound #1 Left,Posterior Lower Leg: Do not change entire dressing for one week. Skin Barriers/Peri-Wound Care: Moisturizing lotion - to leg Wound Cleansing: May shower with protection. Primary Wound Dressing: Wound #1 Left,Posterior Lower Leg: Iodoflex Secondary Dressing: Wound #1 Left,Posterior Lower Leg: Dry Gauze ABD pad Edema Control: 3 Layer Compression System - Left Lower Extremity Avoid standing for long periods of time Elevate legs to the level of the heart or above for 30 minutes daily and/or when sitting, a frequency of: - throughout the week Exercise regularly The following medication(s) was prescribed: Bactrim DS oral 800 mg-160 mg tablet 1 1 tablet oral taken 2 times a day for 14 days starting  01/31/2019 1. At this point my suggestion is good to be that we go ahead and initiate treatment with Iodoflex which I am hopeful will help to clean up the wound bed. 2. I am also going to suggest that we initiate treatment with a 3 layer compression wrap to help with edema control and the patient is in agreement with that plan. 3. I did go ahead and refill the Bactrim for the patient today as that did seem to do well for him but just did not completely resolve the redness and erythema last treatment. I am going to extend this for an additional 14 days treatment and this was sent into the pharmacy for the patient. 4. With regard to edema control also recommend that he elevate his legs as much as possible I think this will be beneficial for him as well. I do believe he can be physically active but when  he sitting I would like for him to elevate. We will see patient back for reevaluation in 1 week here in the clinic. If anything worsens or changes patient will contact our office for additional recommendations. Electronic Signature(s) Signed: 01/31/2019 5:02:14 PM By: Worthy Keeler PA-C Entered By: Worthy Keeler on 01/31/2019 12:17:16 -------------------------------------------------------------------------------- HxROS Details Patient Name: Date of Service: SWOR, [place] 01/31/2019 10:30 AM Medical Record Patient Account Number: [id number] Number: Treating RN: [name] Date of Birth/Sex: 11/30/1940 (78 y.o. Male) Other Clinician: Primary Care Provider: Garret Reddish Treating Provider/Extender:Stone III, Margarita Grizzle Referring Provider: Rosalio Loud in Treatment: 0 Constitutional Symptoms (General Health) Complaints and Symptoms: Negative for: Fatigue; Fever; Chills; Marked Weight Change Eyes Complaints and Symptoms: Positive for: Glasses / Contacts - glasses Medical History: Positive for: Cataracts - both eyes Ear/Nose/Mouth/Throat Complaints and  Symptoms: Negative for: Chronic sinus problems or rhinitis Endocrine Complaints and Symptoms: Negative for: Heat/cold intolerance Integumentary (Skin) Complaints and Symptoms: Positive for: Wounds - wound on left lower leg Hematologic/Lymphatic Respiratory Medical History: Positive for: Sleep Apnea - cpap at night Cardiovascular Medical History: Positive for: Congestive Heart Failure; Peripheral Venous Disease Gastrointestinal Medical History: Past Medical History Notes: fatty liver Genitourinary Medical History: Past Medical History Notes: chronic kidney disease stg 4 Immunological Musculoskeletal Medical History: Positive for: Osteoarthritis Past Medical History Notes: Right knee and shoulder replacement, spinal fusion Neurologic Medical History: Positive for: Neuropathy Oncologic Psychiatric Medical History: Past Medical History Notes: Mild depressed bipolar disorder HBO Extended History Items Eyes: Cataracts Immunizations Pneumococcal Vaccine: Received Pneumococcal Vaccination: Yes Implantable Devices None Family and Social History Cancer: Yes - Mother,Maternal Grandparents,Siblings; Stroke: Yes - Father; Never smoker; Marital Status - Divorced; Alcohol Use: Never; Drug Use: No History; Caffeine Use: Moderate; Financial Concerns: No; Food, Clothing or Shelter Needs: No; Support System Lacking: No; Transportation Concerns: No Electronic Signature(s) Signed: 01/31/2019 5:02:14 PM By: Worthy Keeler PA-C Signed: 02/05/2019 5:59:11 PM By: [name] RN, BSN Entered By: [name] on 01/31/2019 11:34:31 -------------------------------------------------------------------------------- [place] Details Patient Name: Date of Service: Arieon, Corcoran Jamall 01/31/2019 Medical Record [id number] Patient Account Number: [id number] Date of Birth/Sex: Treating RN: 26-Dec-1940 (78 y.o. Male) Baruch Gouty Primary Care Provider: Garret Reddish Other  Clinician: Referring Provider: Treating Provider/Extender:Stone III, Georgiana Spinner, CAMILLE Weeks in Treatment: 0 Diagnosis Coding ICD-10 Codes Code Description I87.2 Venous insufficiency (chronic) (peripheral) S81.802A Unspecified open wound, left lower leg, initial encounter L97.822 Non-pressure chronic ulcer of other part of left lower leg with fat layer exposed N18.4 Chronic kidney disease, stage 4 (severe) G90.09 Other idiopathic peripheral autonomic neuropathy Facility Procedures CPT4 Code Description: [id number] 99213 - WOUND CARE VISIT-LEV 3 EST PT Modifier: 25 Quantity: 1 CPT4 Code Description: [id number] 11042 - DEB SUBQ TISSUE 20 SQ CM/< ICD-10 Diagnosis Description L97.822 Non-pressure chronic ulcer of other part of left lower leg w Modifier: ith fat layer Quantity: 1 exposed Physician Procedures CPT4 Code Description: [id number] - WC PHYS LEVEL 4 - NEW PT ICD-10 Diagnosis Description I87.2 Venous insufficiency (chronic) (peripheral) S81.802A Unspecified open wound, left lower leg, initial encounter N18.4 Chronic kidney disease, stage 4  (severe) 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

## 2019-02-05 NOTE — Progress Notes (Signed)
HARDENBROOK, Amaan ([id number]) Visit Report for 01/31/2019 Allergy List Details Patient Name: Date of Service: MARVELOUS, BOUWENS 01/31/2019 10:30 AM Medical Record Patient Account Number: [id number] Number: Treating RN: [name] Date of Birth/Sex: 04/12/1940 (77 y.o. Male) Other Clinician: Primary Care Dayon Witt: Garret Reddish Treating Nil Xiong/Extender:Stone III, Margarita Grizzle Referring Mykale Gandolfo: Billey Chang Weeks in Treatment: 0 Allergies Active Allergies No Known Drug Allergies Allergy Notes Electronic Signature(s) Signed: 02/05/2019 5:59:11 PM By: [name] RN, BSN Entered By: [name] on 01/31/2019 11:15:54 -------------------------------------------------------------------------------- Arrival Information Details Patient Name: Date of Service: [name], Phillips 01/31/2019 10:30 AM Medical Record Patient Account Number: [id number] Number: Treating RN: [name] Date of Birth/Sex: 1940-10-15 (78 y.o. Male) Other Clinician: Primary Care Seaver Machia: Garret Reddish Treating Hulan Szumski/Extender:Stone III, Margarita Grizzle Referring Melana Hingle: Rosalio Loud in Treatment: 0 Visit Information Patient Arrived: Ambulatory Arrival Time: 11:06 Accompanied By: alone Transfer Assistance: None Patient Identification Verified: Yes Secondary Verification Process Completed: Yes Patient Requires Transmission-Based No Precautions: Patient Has Alerts: No Electronic Signature(s) Signed: 02/05/2019 5:59:11 PM By: [name] RN, BSN Entered By: [name] on 01/31/2019 11:15:07 -------------------------------------------------------------------------------- Compression Therapy Details Patient Name: Date of Service: Jean, Skow [place] 01/31/2019 10:30 AM Medical Record Patient Account Number: [id number] Number: Treating RN: Baruch Gouty Date of Birth/Sex: 07/01/1940 (78 y.o. Male) Other Clinician: Primary Care Silver Achey: Garret Reddish  Treating Cherolyn Behrle/Extender:Stone III, Margarita Grizzle Referring Krystiana Fornes: Rosalio Loud in Treatment: 0 Compression Therapy Performed for Wound Wound #1 Left,Posterior Lower Leg Assessment: Performed By: Clinician [name], RN Compression Type: Three Layer Post Procedure Diagnosis Same as Pre-procedure Electronic Signature(s) Signed: 01/31/2019 5:49:26 PM By: Baruch Gouty RN, BSN Entered By: Baruch Gouty on 01/31/2019 12:04:13 -------------------------------------------------------------------------------- Encounter Discharge Information Details Patient Name: Date of Service: [name], Sharone 01/31/2019 10:30 AM Medical Record Patient Account Number: [id number] Number: Treating RN: [name] Date of Birth/Sex: 1940/08/25 (78 y.o. Male) Other Clinician: Primary Care Luanne Krzyzanowski: Garret Reddish Treating Lilliann Rossetti/Extender:Stone III, Margarita Grizzle Referring Omya Winfield: Rosalio Loud in Treatment: 0 Encounter Discharge Information Items Post Procedure Vitals Discharge Condition: Stable Temperature (F): 98.1 Ambulatory Status: Ambulatory Pulse (bpm): 96 Discharge Destination: Home Respiratory Rate (breaths/min): 18 Transportation: Private Auto Blood Pressure (mmHg): 122/66 Accompanied By: self Schedule Follow-up Appointment: Yes Clinical Summary of Care: Electronic Signature(s) Signed: 01/31/2019 5:42:00 PM By: [name] Entered By: [name] on 01/31/2019 12:24:14 -------------------------------------------------------------------------------- Lower Extremity Assessment Details Patient Name: Date of Service: LEEVON, UPPERMAN 01/31/2019 10:30 AM Medical Record Patient Account Number: [id number] Number: Treating RN: [name] Date of Birth/Sex: [birth year] (78 y.o. Male) Other Clinician: Primary Care Justo Hengel: Garret Reddish Treating Keighan Amezcua/Extender:Stone III, Margarita Grizzle Referring Khadeejah Castner: Billey Chang Weeks in Treatment: 0 Edema  Assessment Assessed: [Left: No] [Right: No] Edema: [Left: Ye] [Right: s] Calf Left: Right: Point of Measurement: 31 cm From Medial Instep 41.6 cm cm Ankle Left: Right: Point of Measurement: 12 cm From Medial Instep 25.6 cm cm Vascular Assessment Pulses: Dorsalis Pedis Palpable: [Left:Yes] Doppler Audible: [Left:Yes] Blood Pressure: Brachial: [Left:122] Ankle: [Left:Dorsalis Pedis: 122 1.00] Electronic Signature(s) Signed: 02/05/2019 5:59:11 PM By: [name] RN, BSN Entered By: [name] on 01/31/2019 11:28:46 -------------------------------------------------------------------------------- [place] Details Patient Name: Date of Service: [name], Farron 01/31/2019 10:30 AM Medical Record Patient Account Number: [id number] Medical Record Patient Account Number: [id number] Number: Treating RN: Baruch Gouty Date of Birth/Sex: 1941-01-23 (78 y.o. Male) Other Clinician: Primary Care Lyriq Jarchow: Garret Reddish Treating Kaulana Brindle/Extender:Stone III, Margarita Grizzle Referring Sigurd Pugh: Billey Chang Weeks in Treatment: 0 Active Inactive Venous Leg Ulcer Nursing Diagnoses: Knowledge deficit related to disease  process and management Potential for venous Insuffiency (use before diagnosis confirmed) Goals: Patient will maintain optimal edema control Date Initiated: 01/31/2019 Target Resolution Date: 02/28/2019 Goal Status: Active Interventions: Assess peripheral edema status every visit. Compression as ordered Treatment Activities: Therapeutic compression applied : 01/31/2019 Notes: Wound/Skin Impairment Nursing Diagnoses: Impaired tissue integrity Knowledge deficit related to ulceration/compromised skin integrity Goals: Patient/caregiver will verbalize understanding of skin care regimen Date Initiated: 01/31/2019 Target Resolution Date: 02/28/2019 Goal Status: Active Ulcer/skin breakdown will have a volume reduction of 30% by week 4 Date  Initiated: 01/31/2019 Target Resolution Date: 02/28/2019 Goal Status: Active Interventions: Assess patient/caregiver ability to obtain necessary supplies Assess patient/caregiver ability to perform ulcer/skin care regimen upon admission and as needed Assess ulceration(s) every visit Treatment Activities: Skin care regimen initiated : 01/31/2019 Topical wound management initiated : 01/31/2019 Notes: Electronic Signature(s) Signed: 01/31/2019 5:49:26 PM By: Baruch Gouty RN, BSN Entered By: Baruch Gouty on 01/31/2019 11:57:32 -------------------------------------------------------------------------------- Pain Assessment Details Patient Name: Date of Service: Enrigue, Hashimi Xzavier 01/31/2019 10:30 AM Medical Record Patient Account Number: [id number] Number: Treating RN: [name] Date of Birth/Sex: 29-Apr-1940 (78 y.o. Male) Other Clinician: Primary Care Dejan Angert: Garret Reddish Treating Jashua Knaak/Extender:Stone III, Margarita Grizzle Referring Nazirah Tri: Billey Chang Weeks in Treatment: 0 Active Problems Location of Pain Severity and Description of Pain Patient Has Paino Yes Site Locations Pain Location: Pain in Ulcers With Dressing Change: Yes Duration of the Pain. Constant / Intermittento Intermittent Rate the pain. Current Pain Level: 4 Worst Pain Level: 8 Least Pain Level: 0 Character of Pain Describe the Pain: Burning Pain Management and Medication Current Pain Management: Medication: Yes Cold Application: No Rest: No Massage: No Activity: No T.E.N.S.: No Heat Application: No Leg drop or elevation: No Is the Current Pain Management Adequate: Adequate How does your wound impact your activities of daily livingo Sleep: No Bathing: No Appetite: No Relationship With Others: No Bladder Continence: No Emotions: No Bowel Continence: No Work: No Toileting: No Drive: No Dressing: No Hobbies: No Electronic Signature(s) Signed: 02/05/2019 5:59:11 PM By:  [name] RN, BSN Entered By: [name] on 01/31/2019 11:31:36 -------------------------------------------------------------------------------- Patient/Caregiver Education Details Patient Name: [name], Delois 11/11/2020andnbsp10:30 Date of Service: AM Medical Record [id number] Number: Patient Account Number: [id number] Treating RN: Date of Birth/Gender: [birth year] (77 y.o. Baruch Gouty Male) Other Clinician: Primary Care Treating Hunter, Rosilyn Mings Physician: Physician/Extender: Referring Physician: Rosalio Loud in Treatment: 0 Education Assessment Education Provided To: Patient Education Topics Provided Venous: Handouts: Controlling Swelling with Multilayered Compression Wraps Methods: Explain/Verbal, Printed Responses: State content correctly Welcome To [facility]: Handouts: Welcome To [facility] Methods: Explain/Verbal Responses: Reinforcements needed, State content correctly Wound Debridement: Handouts: Wound Debridement Methods: Explain/Verbal, Printed Responses: Reinforcements needed, State content correctly Electronic Signature(s) Signed: 01/31/2019 5:49:26 PM By: Baruch Gouty RN, BSN Entered By: Baruch Gouty on 01/31/2019 11:58:24 -------------------------------------------------------------------------------- Wound Assessment Details Patient Name: Date of Service: [name], Joziyah 01/31/2019 10:30 AM Medical Record Patient Account Number: [id number] Number: Treating RN: [name] Date of Birth/Sex: 03-05-1941 (78 y.o. Male) Other Clinician: Primary Care Maraya Gwilliam: Garret Reddish Treating Tico Crotteau/Extender:Stone III, Margarita Grizzle Referring Draydon Clairmont: Jonni Sanger, CAMILLE Weeks in Treatment: 0 Wound Status Wound Number: 1 Primary Venous Leg Ulcer Etiology: Wound Location: Left Lower Leg - Posterior Wound Open Wounding Event: Trauma Status: Date Acquired: 11/29/2018 Comorbid Cataracts, Sleep  Apnea, Congestive Heart Weeks Of Treatment: 0 History: Failure, Peripheral Venous Disease, Clustered Wound: No Osteoarthritis, Neuropathy Photos Wound Measurements Length: (cm) 5.8 % Reduct Width: (cm) 2.7 % Reduct  Depth: (cm) 0.2 Epitheli Area: (cm) 12.299 Tunneli Volume: (cm) 2.46 Undermi Wound Description Full Thickness Without Exposed Support Foul Odo Classification: Structures Slough/F Wound Flat and Intact Margin: Exudate Medium Amount: Exudate Serosanguineous Type: Exudate red, brown Color: Wound Bed Granulation Amount: Small (1-33%) Granulation Quality: Pink Fascia E Necrotic Amount: Large (67-100%) Fat Laye Necrotic Quality: Adherent Slough Tendon E Muscle E Joint Ex Bone Exp r After Cleansing: No ibrino Yes Exposed Structure xposed: No r (Subcutaneous Tissue) Exposed: Yes xposed: No xposed: No posed: No osed: No ion in Area: 0% ion in Volume: 0% alization: None ng: No ning: No Treatment Notes Wound #1 (Left, Posterior Lower Leg) 1. Cleanse With Wound Cleanser Soap and water 2. Periwound Care Moisturizing lotion 3. Primary Dressing Applied Iodoflex 4. Secondary Dressing ABD Pad 6. Support Layer Applied 3 layer compression wrap Notes netting. explained how to care dressings, orders, and when to return to wound center. patient in agreement. Electronic Signature(s) Signed: 02/02/2019 4:02:07 PM By: Mikeal Hawthorne EMT/HBOT Signed: 02/05/2019 5:59:11 PM By: [name] RN, BSN Entered By: Mikeal Hawthorne on 02/02/2019 09:38:33 -------------------------------------------------------------------------------- [place] Details Patient Name: Date of Service: [name], Zalman 01/31/2019 10:30 AM Medical Record Patient Account Number: [id number] Number: Treating RN: [name] Date of Birth/Sex: 09-Mar-1941 (78 y.o. Male) Other Clinician: Primary Care Tichina Koebel: Garret Reddish Treating Raimundo Corbit/Extender:Stone III, Margarita Grizzle Referring  Havoc Sanluis: Rosalio Loud in Treatment: 0 Vital Signs Time Taken: 11:10 Temperature (F): 98.1 Height (in): 74 Pulse (bpm): 96 Source: Stated Respiratory Rate (breaths/min): 18 Weight (lbs): 180 Blood Pressure (mmHg): 122/66 Source: Stated Reference Range: 80 - 120 mg / dl Body Mass Index (BMI): 23.1 Electronic Signature(s) Signed: 02/05/2019 5:59:11 PM By: [name] RN, BSN Entered By: [name] on 01/31/2019 11:15:45

## 2019-02-07 ENCOUNTER — Other Ambulatory Visit (HOSPITAL_COMMUNITY)
Admission: RE | Admit: 2019-02-07 | Discharge: 2019-02-07 | Disposition: A | Discharge status: Home / Self Care | Payer: Medicare Other | Source: Other Acute Inpatient Hospital | Attending: Physician Assistant | Admitting: Physician Assistant

## 2019-02-07 ENCOUNTER — Other Ambulatory Visit: Payer: Self-pay

## 2019-02-07 ENCOUNTER — Encounter (HOSPITAL_BASED_OUTPATIENT_CLINIC_OR_DEPARTMENT_OTHER): Payer: Medicare Other | Admitting: Physician Assistant

## 2019-02-07 DIAGNOSIS — L97822 Non-pressure chronic ulcer of other part of left lower leg with fat layer exposed: Secondary | ICD-10-CM | POA: Insufficient documentation

## 2019-02-07 DIAGNOSIS — Z96651 Presence of right artificial knee joint: Secondary | ICD-10-CM | POA: Diagnosis not present

## 2019-02-07 DIAGNOSIS — L97222 Non-pressure chronic ulcer of left calf with fat layer exposed: Secondary | ICD-10-CM | POA: Diagnosis not present

## 2019-02-07 DIAGNOSIS — G4730 Sleep apnea, unspecified: Secondary | ICD-10-CM | POA: Diagnosis not present

## 2019-02-07 DIAGNOSIS — G9009 Other idiopathic peripheral autonomic neuropathy: Secondary | ICD-10-CM | POA: Diagnosis not present

## 2019-02-07 DIAGNOSIS — N184 Chronic kidney disease, stage 4 (severe): Secondary | ICD-10-CM | POA: Diagnosis not present

## 2019-02-07 DIAGNOSIS — Z96611 Presence of right artificial shoulder joint: Secondary | ICD-10-CM | POA: Diagnosis not present

## 2019-02-07 DIAGNOSIS — I872 Venous insufficiency (chronic) (peripheral): Secondary | ICD-10-CM | POA: Diagnosis not present

## 2019-02-07 DIAGNOSIS — I509 Heart failure, unspecified: Secondary | ICD-10-CM | POA: Diagnosis not present

## 2019-02-07 NOTE — Progress Notes (Signed)
Teigen, Bellin Daivd ([id number]) Visit Report for 02/07/2019 Chief Complaint Document Details Patient Name: Date of Service: [name], [name] 02/07/2019 11:00 AM Medical Record [id number] Patient Account Number: [id number] Date of Birth/Sex: 06/14/1940 (77 y.o. M) Treating RN: Baruch Gouty Primary Care Provider: Garret Reddish Other Clinician: Referring Provider: Treating Provider/Extender:Stone III, Warner Mccreedy, Alexis Goodell in Treatment: 1 Information Obtained from: Patient Chief Complaint Left LE Ulcer secondary to trauma but non-healing at this point Electronic Signature(s) Signed: 02/07/2019 5:11:42 PM By: Worthy Keeler PA-C Entered By: Worthy Keeler on 02/07/2019 12:47:22 -------------------------------------------------------------------------------- Debridement Details Patient Name: Date of Service: [name], [name] 02/07/2019 11:00 AM Medical Record [id number] Patient Account Number: [id number] Date of Birth/Sex: [birth year] (77 y.o. M) Treating RN: Baruch Gouty Primary Care Provider: Garret Reddish Other Clinician: Referring Provider: Treating Provider/Extender:Stone III, Warner Mccreedy, Alexis Goodell in Treatment: 1 Debridement Performed for Wound #1 Left,Posterior Lower Leg Assessment: Performed By: Physician Worthy Keeler, PA Debridement Type: Debridement Severity of Tissue Pre Fat layer exposed Debridement: Level of Consciousness (Pre- Awake and Alert procedure): Pre-procedure Verification/Time Out Taken: Yes - 12:50 Start Time: 12:50 Pain Control: Other : benzocaine 20% spray Total Area Debrided (L x W): 3 (cm) x 1.5 (cm) = 4.5 (cm) Tissue and other material Viable, Non-Viable, Slough, Subcutaneous, Slough debrided: Level: Skin/Subcutaneous Tissue Debridement Description: Excisional Instrument: Curette Specimen: Swab, Number of Specimens Taken: 1 Bleeding: Minimum Hemostasis Achieved: Pressure End Time: 12:52 Procedural Pain:  7 Post Procedural Pain: 4 Response to Treatment: Procedure was tolerated well Level of Consciousness Awake and Alert (Post-procedure): Post Debridement Measurements of Total Wound Length: (cm) 6 Width: (cm) 2.8 Depth: (cm) 0.2 Volume: (cm) 2.639 Character of Wound/Ulcer Post Requires Further Debridement Debridement: Severity of Tissue Post Debridement: Fat layer exposed Post Procedure Diagnosis Same as Pre-procedure Electronic Signature(s) Signed: 02/07/2019 5:11:42 PM By: Worthy Keeler PA-C Signed: 02/07/2019 5:42:21 PM By: Baruch Gouty RN, BSN Entered By: Baruch Gouty on 02/07/2019 12:53:56 -------------------------------------------------------------------------------- HPI Details Patient Name: Date of Service: [name], [name] 02/07/2019 11:00 AM Medical Record [id number] Patient Account Number: [id number] Date of Birth/Sex: May 09, 1940 (77 y.o. M) Treating RN: Baruch Gouty Primary Care Provider: Garret Reddish Other Clinician: Referring Provider: Treating Provider/Extender:Stone III, Warner Mccreedy, Alexis Goodell in Treatment: 1 History of Present Illness HPI Description: 01/31/2019 on evaluation today patient actually appears to be doing poorly unfortunately secondary to an injury that occurred when he hit his leg on a car door September 9. He was seen in the ER where he had 13 sutures placed. His primary care provider remove those 2 weeks later and unfortunately the wound began to dehisce following. It appears that he may have had a skin flap that just did not take and survive. Nonetheless he was stated to have had Keflex given from the ER though the patient states he does not even remember that. He also is stated to have taken 2 rounds of doxycycline by his primary care provider but again the patient states he only remembers the most recent antibiotic which was actually the Augmentin then switched to Bactrim based on the culture results. The culture showed  that he had Morganella Morgagni noted in the culture. Subsequently when he was taking the Bactrim he states that the redness got much better but then unfortunately worsened again once he came off of the medication. Apparently the patient was given triamcinolone ointment to put on the wound although obviously that really did not do much for the wound and again the patient tells me that he was "given  the wrong ointment". Again I am not exactly sure what was going on in that scenario 1 where another. Either way I do believe that he has necrotic tissue on the surface of the wound this can require some debridement if he is able to tolerate it and then subsequently he is also get a need likely a refill of the Bactrim in order to help treat the infection I do not believe this is completely cleared up at this point. I also think that he really needs to have edema control again he is very swollen and I believe that Performing a compression wrap will also be of great benefit for him. The patient does have chronic kidney disease stage IV along with peripheral neuropathy secondary to issues with his back. 02/07/2019 on evaluation today patient appears to be doing a little better with regard to the overall appearance of the wound although it appears to be very dry. Fortunately there is no signs of active infection at this time. No fever chills noted. I really feel like that the patient may be having issues at this point with the Iodoflex being too absorptive for him at this point which is causing this to dry out. I think we may want to try something different to see if that would be of benefit for him. Electronic Signature(s) Signed: 02/07/2019 5:11:42 PM By: Worthy Keeler PA-C Entered By: Worthy Keeler on 02/07/2019 12:59:07 -------------------------------------------------------------------------------- Physical Exam Details Patient Name: Date of Service: [name], [name] 02/07/2019 11:00 AM Medical  Record [id number] Patient Account Number: [id number] Date of Birth/Sex: 26-Jul-1940 (77 y.o. M) Treating RN: Baruch Gouty Primary Care Provider: Garret Reddish Other Clinician: Referring Provider: Treating Provider/Extender:Stone III, Warner Mccreedy, Alexis Goodell in Treatment: 1 Constitutional Well-nourished and well-hydrated in no acute distress. Respiratory normal breathing without difficulty. clear to auscultation bilaterally. Cardiovascular regular rate and rhythm with normal S1, S2. Psychiatric this patient is able to make decisions and demonstrates good insight into disease process. Alert and Oriented x 3. pleasant and cooperative. Notes Upon inspection patient's wound bed actually showed signs of good granulation at this point in some areas although there was a film of necrotic tissue and biofilm over the surface of the wound that is preventing better healing in my opinion. Unfortunately the Iodoflex kept things to dry and I think that has not really been beneficial for the patient either. Fortunately there is no signs of active infection systemically although there is some evidence still of local infection he is showing signs of erythema and warmth at this point. Electronic Signature(s) Signed: 02/07/2019 5:11:42 PM By: Worthy Keeler PA-C Entered By: Worthy Keeler on 02/07/2019 12:59:42 -------------------------------------------------------------------------------- Physician Orders Details Patient Name: Date of Service: [name], [name] 02/07/2019 11:00 AM Medical Record [id number] Patient Account Number: [id number] Date of Birth/Sex: 10-31-1940 (77 y.o. M) Treating RN: Baruch Gouty Primary Care Provider: Garret Reddish Other Clinician: Referring Provider: Treating Provider/Extender:Stone III, Warner Mccreedy, Alexis Goodell in Treatment: 1 Verbal / Phone Orders: No Diagnosis Coding ICD-10 Coding Code Description I87.2 Venous insufficiency (chronic)  (peripheral) S81.802A Unspecified open wound, left lower leg, initial encounter L97.822 Non-pressure chronic ulcer of other part of left lower leg with fat layer exposed N18.4 Chronic kidney disease, stage 4 (severe) G90.09 Other idiopathic peripheral autonomic neuropathy Follow-up Appointments Return Appointment in 1 week. Dressing Change Frequency Wound #1 Left,Posterior Lower Leg Do not change entire dressing for one week. Skin Barriers/Peri-Wound Care Moisturizing lotion - to leg Wound Cleansing May shower with protection. Primary Wound  Dressing Wound #1 Left,Posterior Lower Leg Santyl Ointment Hydrogel - thin layer over santyl Secondary Dressing Wound #1 Left,Posterior Lower Leg Dry Gauze ABD pad Edema Control 3 Layer Compression System - Left Lower Extremity Avoid standing for long periods of time Elevate legs to the level of the heart or above for 30 minutes daily and/or when sitting, a frequency of: - throughout the week Exercise regularly Laboratory Bacteria identified in Unspecified specimen by Anaerobe culture (MICRO) - left lower leg LOINC Code: 973-5 Convenience Name: Anerobic culture Patient Medications Allergies: No Known Drug Allergies Notifications Medication Indication Start End benzocaine prior to 02/07/2019 debridement DOSE topical 20 % aerosol - aerosol topical Electronic Signature(s) Signed: 02/07/2019 5:11:42 PM By: Worthy Keeler PA-C Signed: 02/07/2019 5:42:21 PM By: Baruch Gouty RN, BSN Entered By: Baruch Gouty on 02/07/2019 12:54:31 -------------------------------------------------------------------------------- Problem List Details Patient Name: Date of Service: [name], [name] 02/07/2019 11:00 AM Medical Record [id number] Patient Account Number: [id number] Date of Birth/Sex: [birth year] (77 y.o. M) Treating RN: Baruch Gouty Primary Care Provider: Garret Reddish Other Clinician: Referring Provider: Treating  Provider/Extender:Stone III, Warner Mccreedy, Alexis Goodell in Treatment: 1 Active Problems ICD-10 Evaluated Encounter Code Description Active Date Today Diagnosis I87.2 Venous insufficiency (chronic) (peripheral) 01/31/2019 No Yes S81.802A Unspecified open wound, left lower leg, initial 01/31/2019 No Yes encounter L97.822 Non-pressure chronic ulcer of other part of left lower 01/31/2019 No Yes leg with fat layer exposed N18.4 Chronic kidney disease, stage 4 (severe) 01/31/2019 No Yes G90.09 Other idiopathic peripheral autonomic neuropathy 01/31/2019 No Yes Inactive Problems Resolved Problems Electronic Signature(s) Signed: 02/07/2019 5:11:42 PM By: Worthy Keeler PA-C Entered By: Worthy Keeler on 02/07/2019 12:47:18 -------------------------------------------------------------------------------- Progress Note Details Patient Name: Date of Service: [name], [name] 02/07/2019 11:00 AM Medical Record [id number] Patient Account Number: [id number] Date of Birth/Sex: [birth year] (77 y.o. M) Treating RN: Baruch Gouty Primary Care Provider: Garret Reddish Other Clinician: Referring Provider: Treating Provider/Extender:Stone III, Warner Mccreedy, Alexis Goodell in Treatment: 1 Subjective Chief Complaint Information obtained from Patient Left LE Ulcer secondary to trauma but non-healing at this point History of Present Illness (HPI) 01/31/2019 on evaluation today patient actually appears to be doing poorly unfortunately secondary to an injury that occurred when he hit his leg on a car door September 9. He was seen in the ER where he had 13 sutures placed. His primary care provider remove those 2 weeks later and unfortunately the wound began to dehisce following. It appears that he may have had a skin flap that just did not take and survive. Nonetheless he was stated to have had Keflex given from the ER though the patient states he does not even remember that. He also is stated to  have taken 2 rounds of doxycycline by his primary care provider but again the patient states he only remembers the most recent antibiotic which was actually the Augmentin then switched to Bactrim based on the culture results. The culture showed that he had Morganella Morgagni noted in the culture. Subsequently when he was taking the Bactrim he states that the redness got much better but then unfortunately worsened again once he came off of the medication. Apparently the patient was given triamcinolone ointment to put on the wound although obviously that really did not do much for the wound and again the patient tells me that he was "given the wrong ointment". Again I am not exactly sure what was going on in that scenario 1 where another. Either way I do believe that he has necrotic tissue on  the surface of the wound this can require some debridement if he is able to tolerate it and then subsequently he is also get a need likely a refill of the Bactrim in order to help treat the infection I do not believe this is completely cleared up at this point. I also think that he really needs to have edema control again he is very swollen and I believe that Performing a compression wrap will also be of great benefit for him. The patient does have chronic kidney disease stage IV along with peripheral neuropathy secondary to issues with his back. 02/07/2019 on evaluation today patient appears to be doing a little better with regard to the overall appearance of the wound although it appears to be very dry. Fortunately there is no signs of active infection at this time. No fever chills noted. I really feel like that the patient may be having issues at this point with the Iodoflex being too absorptive for him at this point which is causing this to dry out. I think we may want to try something different to see if that would be of benefit for him. Patient History Information obtained from Patient. Family  History Cancer - Mother,Maternal Grandparents,Siblings, Stroke - Father. Social History Never smoker, Marital Status - Divorced, Alcohol Use - Never, Drug Use - No History, Caffeine Use - Moderate. Medical History Eyes Patient has history of Cataracts - both eyes Respiratory Patient has history of Sleep Apnea - cpap at night Cardiovascular Patient has history of Congestive Heart Failure, Peripheral Venous Disease Musculoskeletal Patient has history of Osteoarthritis Neurologic Patient has history of Neuropathy Medical And Surgical History Notes Gastrointestinal fatty liver Genitourinary chronic kidney disease stg 4 Musculoskeletal Right knee and shoulder replacement, spinal fusion Psychiatric Mild depressed bipolar disorder Review of Systems (ROS) Constitutional Symptoms (General Health) Denies complaints or symptoms of Fatigue, Fever, Chills, Marked Weight Change. Respiratory Denies complaints or symptoms of Chronic or frequent coughs, Shortness of Breath. Cardiovascular Denies complaints or symptoms of Chest pain. Psychiatric Denies complaints or symptoms of Claustrophobia, Suicidal. Objective Constitutional Well-nourished and well-hydrated in no acute distress. Vitals Time Taken: 12:08 PM, Height: 74 in, Weight: 180 lbs, BMI: 23.1, Temperature: 98 F, Pulse: 93 bpm, Respiratory Rate: 16 breaths/min, Blood Pressure: 118/71 mmHg. Respiratory normal breathing without difficulty. clear to auscultation bilaterally. Cardiovascular regular rate and rhythm with normal S1, S2. Psychiatric this patient is able to make decisions and demonstrates good insight into disease process. Alert and Oriented x 3. pleasant and cooperative. General Notes: Upon inspection patient's wound bed actually showed signs of good granulation at this point in some areas although there was a film of necrotic tissue and biofilm over the surface of the wound that is preventing better healing in my  opinion. Unfortunately the Iodoflex kept things to dry and I think that has not really been beneficial for the patient either. Fortunately there is no signs of active infection systemically although there is some evidence still of local infection he is showing signs of erythema and warmth at this point. Integumentary (Hair, Skin) Wound #1 status is Open. Original cause of wound was Trauma. The wound is located on the Left,Posterior Lower Leg. The wound measures 6cm length x 2.8cm width x 0.2cm depth; 13.195cm^2 area and 2.639cm^3 volume. There is Fat Layer (Subcutaneous Tissue) Exposed exposed. There is no tunneling noted. There is a medium amount of serosanguineous drainage noted. The wound margin is flat and intact. There is small (1-33%) pink granulation within the wound  bed. There is a large (67-100%) amount of necrotic tissue within the wound bed including Adherent Slough. Assessment Active Problems ICD-10 Venous insufficiency (chronic) (peripheral) Unspecified open wound, left lower leg, initial encounter Non-pressure chronic ulcer of other part of left lower leg with fat layer exposed Chronic kidney disease, stage 4 (severe) Other idiopathic peripheral autonomic neuropathy Procedures Wound #1 Pre-procedure diagnosis of Wound #1 is a Venous Leg Ulcer located on the Left,Posterior Lower Leg .Severity of Tissue Pre Debridement is: Fat layer exposed. There was a Excisional Skin/Subcutaneous Tissue Debridement with a total area of 4.5 sq cm performed by Worthy Keeler, PA. With the following instrument(s): Curette to remove Viable and Non-Viable tissue/material. Material removed includes Subcutaneous Tissue and Slough and after achieving pain control using Other (benzocaine 20% spray). 1 specimen was taken by a Swab and sent to the lab per facility protocol. A time out was conducted at 12:50, prior to the start of the procedure. A Minimum amount of bleeding was controlled with Pressure.  The procedure was tolerated well with a pain level of 7 throughout and a pain level of 4 following the procedure. Post Debridement Measurements: 6cm length x 2.8cm width x 0.2cm depth; 2.639cm^3 volume. Character of Wound/Ulcer Post Debridement requires further debridement. Severity of Tissue Post Debridement is: Fat layer exposed. Post procedure Diagnosis Wound #1: Same as Pre-Procedure Pre-procedure diagnosis of Wound #1 is a Venous Leg Ulcer located on the Left,Posterior Lower Leg . There was a Three Layer Compression Therapy Procedure by [name], RN. Post procedure Diagnosis Wound #1: Same as Pre-Procedure Plan Follow-up Appointments: Return Appointment in 1 week. Dressing Change Frequency: Wound #1 Left,Posterior Lower Leg: Do not change entire dressing for one week. Skin Barriers/Peri-Wound Care: Moisturizing lotion - to leg Wound Cleansing: May shower with protection. Primary Wound Dressing: Wound #1 Left,Posterior Lower Leg: Santyl Ointment Hydrogel - thin layer over santyl Secondary Dressing: Wound #1 Left,Posterior Lower Leg: Dry Gauze ABD pad Edema Control: 3 Layer Compression System - Left Lower Extremity Avoid standing for long periods of time Elevate legs to the level of the heart or above for 30 minutes daily and/or when sitting, a frequency of: - throughout the week Exercise regularly Laboratory ordered were: Anerobic culture - left lower leg The following medication(s) was prescribed: benzocaine topical 20 % aerosol aerosol topical for prior to debridement was prescribed at facility 1 I am in a suggest that we switch to continue with the wrap but using Santyl along with hydrogel underneath the wrap in order to try to help loosen up some of this biofilm and necrotic debris. 2. I am when I suggest as well that we continue with a 3 layer compression wrap which I do think has helped him at this point. 3. I did obtain a wound culture today which I am get a  test for any evidence of active infection that may not be managed by the Bactrim at this point. I did that after performing debridement and then at the base of the wound obtain the culture. We will see patient back for reevaluation in 1 week here in the clinic. If anything worsens or changes patient will contact our office for additional recommendations. Electronic Signature(s) Signed: 02/07/2019 5:11:42 PM By: Worthy Keeler PA-C Entered By: Worthy Keeler on 02/07/2019 13:00:33 -------------------------------------------------------------------------------- HxROS Details Patient Name: Date of Service: LEHRMANN, Corvin 02/07/2019 11:00 AM Medical Record [id number] Patient Account Number: [id number] Date of Birth/Sex: 1941/02/23 (77 y.o. M) Treating RN: Baruch Gouty Primary Care  Provider: Garret Reddish Other Clinician: Referring Provider: Treating Provider/Extender:Stone III, Warner Mccreedy, Alexis Goodell in Treatment: 1 Information Obtained From Patient Constitutional Symptoms (General Health) Complaints and Symptoms: Negative for: Fatigue; Fever; Chills; Marked Weight Change Respiratory Complaints and Symptoms: Negative for: Chronic or frequent coughs; Shortness of Breath Medical History: Positive for: Sleep Apnea - cpap at night Cardiovascular Complaints and Symptoms: Negative for: Chest pain Medical History: Positive for: Congestive Heart Failure; Peripheral Venous Disease Psychiatric Complaints and Symptoms: Negative for: Claustrophobia; Suicidal Medical History: Past Medical History Notes: Mild depressed bipolar disorder Eyes Medical History: Positive for: Cataracts - both eyes Gastrointestinal Medical History: Past Medical History Notes: fatty liver Genitourinary Medical History: Past Medical History Notes: chronic kidney disease stg 4 Musculoskeletal Medical History: Positive for: Osteoarthritis Past Medical History Notes: Right knee and  shoulder replacement, spinal fusion Neurologic Medical History: Positive for: Neuropathy HBO Extended History Items Eyes: Cataracts Immunizations Pneumococcal Vaccine: Received Pneumococcal Vaccination: Yes Implantable Devices None Family and Social History Cancer: Yes - Mother,Maternal Grandparents,Siblings; Stroke: Yes - Father; Never smoker; Marital Status - Divorced; Alcohol Use: Never; Drug Use: No History; Caffeine Use: Moderate; Financial Concerns: No; Food, Clothing or Shelter Needs: No; Support System Lacking: No; Transportation Concerns: No Physician Affirmation I have reviewed and agree with the above information. Electronic Signature(s) Signed: 02/07/2019 5:11:42 PM By: Worthy Keeler PA-C Signed: 02/07/2019 5:42:21 PM By: Baruch Gouty RN, BSN Entered By: Worthy Keeler on 02/07/2019 12:59:22 -------------------------------------------------------------------------------- SuperBill Details Patient Name: Date of Service: Brenda, Cowher Rihaan 02/07/2019 Medical Record [id number] Patient Account Number: [id number] Date of Birth/Sex: Treating RN: 01-29-1941 (78 y.o. [name] Primary Care Provider: Garret Reddish Other Clinician: Referring Provider: Treating Provider/Extender:Stone III, Warner Mccreedy, Alexis Goodell in Treatment: 1 Diagnosis Coding ICD-10 Codes Code Description I87.2 Venous insufficiency (chronic) (peripheral) S81.802A Unspecified open wound, left lower leg, initial encounter L97.822 Non-pressure chronic ulcer of other part of left lower leg with fat layer exposed N18.4 Chronic kidney disease, stage 4 (severe) G90.09 Other idiopathic peripheral autonomic neuropathy Facility Procedures CPT4 Code Description: [id number] 11042 - DEB SUBQ TISSUE 20 SQ CM/< ICD-10 Diagnosis Description L97.822 Non-pressure chronic ulcer of other part of left lower leg w Modifier: ith fat laye Quantity: 1 r exposed Physician Procedures CPT4 Code  Description: [id number] 11042 - WC PHYS SUBQ TISS 20 SQ CM ICD-10 Diagnosis Description ICD-10 Diagnosis Description L97.822 Non-pressure chronic ulcer of other part of left lower leg wi Modifier: th fat layer Quantity: 1 exposed Electronic Signature(s) Signed: 02/07/2019 5:11:42 PM By: Worthy Keeler PA-C Entered By: Worthy Keeler on 02/07/2019 13:00:47

## 2019-02-10 LAB — AEROBIC CULTURE W GRAM STAIN (SUPERFICIAL SPECIMEN): Culture: NO GROWTH

## 2019-02-14 ENCOUNTER — Encounter (HOSPITAL_BASED_OUTPATIENT_CLINIC_OR_DEPARTMENT_OTHER): Payer: Medicare Other | Admitting: Physician Assistant

## 2019-02-14 ENCOUNTER — Other Ambulatory Visit: Payer: Self-pay

## 2019-02-14 DIAGNOSIS — Z96651 Presence of right artificial knee joint: Secondary | ICD-10-CM | POA: Diagnosis not present

## 2019-02-14 DIAGNOSIS — G4730 Sleep apnea, unspecified: Secondary | ICD-10-CM | POA: Diagnosis not present

## 2019-02-14 DIAGNOSIS — Z96611 Presence of right artificial shoulder joint: Secondary | ICD-10-CM | POA: Diagnosis not present

## 2019-02-14 DIAGNOSIS — N184 Chronic kidney disease, stage 4 (severe): Secondary | ICD-10-CM | POA: Diagnosis not present

## 2019-02-14 DIAGNOSIS — L97222 Non-pressure chronic ulcer of left calf with fat layer exposed: Secondary | ICD-10-CM | POA: Diagnosis not present

## 2019-02-14 DIAGNOSIS — I872 Venous insufficiency (chronic) (peripheral): Secondary | ICD-10-CM | POA: Diagnosis not present

## 2019-02-14 DIAGNOSIS — L97822 Non-pressure chronic ulcer of other part of left lower leg with fat layer exposed: Secondary | ICD-10-CM | POA: Diagnosis not present

## 2019-02-14 DIAGNOSIS — G9009 Other idiopathic peripheral autonomic neuropathy: Secondary | ICD-10-CM | POA: Diagnosis not present

## 2019-02-14 DIAGNOSIS — I509 Heart failure, unspecified: Secondary | ICD-10-CM | POA: Diagnosis not present

## 2019-02-14 NOTE — Progress Notes (Signed)
Hiroyuki, Ozanich Obediah ([id number]) Visit Report for 02/14/2019 Chief Complaint Document Details Patient Name: Date of Service: [name], [name] 02/14/2019 10:15 AM Medical Record [id number] Patient Account Number: [id number] Date of Birth/Sex: 1940/05/11 (78 y.o. M) Treating RN: Primary Care Provider: Garret Reddish Other Clinician: Referring Provider: Treating Provider/Extender:Stone III, Warner Mccreedy, Alexis Goodell in Treatment: 2 Information Obtained from: Patient Chief Complaint Left LE Ulcer secondary to trauma but non-healing at this point Electronic Signature(s) Signed: 02/14/2019 4:52:33 PM By: Worthy Keeler PA-C Entered By: Worthy Keeler on 02/14/2019 11:01:06 -------------------------------------------------------------------------------- Debridement Details Patient Name: Date of Service: [name], [name] [name] 02/14/2019 10:15 AM Medical Record [id number] Patient Account Number: [id number] Date of Birth/Sex: 1940-05-14 (78 y.o. M) Treating RN: Baruch Gouty Primary Care Provider: Garret Reddish Other Clinician: Referring Provider: Treating Provider/Extender:Stone III, Warner Mccreedy, Alexis Goodell in Treatment: 2 Debridement Performed for Wound #1 Left,Posterior Lower Leg Assessment: Performed By: Physician Worthy Keeler, PA Debridement Type: Debridement Severity of Tissue Pre Fat layer exposed Debridement: Level of Consciousness (Pre- Awake and Alert procedure): Pre-procedure Verification/Time Out Taken: Yes - 11:00 Start Time: 11:01 Pain Control: Other : benzocaine 20% spray Total Area Debrided (L x W): 3 (cm) x 1.5 (cm) = 4.5 (cm) Tissue and other material Viable, Non-Viable, Slough, Subcutaneous, Slough debrided: Level: Skin/Subcutaneous Tissue Debridement Description: Excisional Instrument: Curette Bleeding: Minimum Hemostasis Achieved: Pressure End Time: 11:05 Procedural Pain: 7 Post Procedural Pain: 4 Response to Treatment: Procedure was  tolerated well Level of Consciousness Awake and Alert (Post-procedure): Post Debridement Measurements of Total Wound Length: (cm) 5.5 Width: (cm) 2.8 Depth: (cm) 0.2 Volume: (cm) 2.419 Character of Wound/Ulcer Post Requires Further Debridement Debridement: Severity of Tissue Post Debridement: Fat layer exposed Post Procedure Diagnosis Same as Pre-procedure Electronic Signature(s) Signed: 02/14/2019 3:14:28 PM By: Baruch Gouty RN, BSN Signed: 02/14/2019 4:52:33 PM By: Worthy Keeler PA-C Entered By: Baruch Gouty on 02/14/2019 11:05:26 -------------------------------------------------------------------------------- HPI Details Patient Name: Date of Service: [name], [name] 02/14/2019 10:15 AM Medical Record [id number] Patient Account Number: [id number] Date of Birth/Sex: February 07, 1941 (78 y.o. M) Treating RN: Primary Care Provider: Garret Reddish Other Clinician: Referring Provider: Treating Provider/Extender:Stone III, Warner Mccreedy, Alexis Goodell in Treatment: 2 History of Present Illness HPI Description: 01/31/2019 on evaluation today patient actually appears to be doing poorly unfortunately secondary to an injury that occurred when he hit his leg on a car door September 9. He was seen in the ER where he had 13 sutures placed. His primary care provider remove those 2 weeks later and unfortunately the wound began to dehisce following. It appears that he may have had a skin flap that just did not take and survive. Nonetheless he was stated to have had Keflex given from the ER though the patient states he does not even remember that. He also is stated to have taken 2 rounds of doxycycline by his primary care provider but again the patient states he only remembers the most recent antibiotic which was actually the Augmentin then switched to Bactrim based on the culture results. The culture showed that he had Morganella Morgagni noted in the culture. Subsequently when he  was taking the Bactrim he states that the redness got much better but then unfortunately worsened again once he came off of the medication. Apparently the patient was given triamcinolone ointment to put on the wound although obviously that really did not do much for the wound and again the patient tells me that he was "given the wrong ointment". Again I am not exactly sure what was  going on in that scenario 1 where another. Either way I do believe that he has necrotic tissue on the surface of the wound this can require some debridement if he is able to tolerate it and then subsequently he is also get a need likely a refill of the Bactrim in order to help treat the infection I do not believe this is completely cleared up at this point. I also think that he really needs to have edema control again he is very swollen and I believe that Performing a compression wrap will also be of great benefit for him. The patient does have chronic kidney disease stage IV along with peripheral neuropathy secondary to issues with his back. 02/07/2019 on evaluation today patient appears to be doing a little better with regard to the overall appearance of the wound although it appears to be very dry. Fortunately there is no signs of active infection at this time. No fever chills noted. I really feel like that the patient may be having issues at this point with the Iodoflex being too absorptive for him at this point which is causing this to dry out. I think we may want to try something different to see if that would be of benefit for him. 02/14/2019 on evaluation today patient actually appears to be doing much better with regard to his wound at this time. He has been tolerating the dressing changes without complication. Fortunately everything appears to be doing much better from the standpoint of how soft the dead tissue is today in fact I was able to debride away some of the necrotic tissue because it was doing so well.  Fortunately he is showing signs of good improvement as of this week. Electronic Signature(s) Signed: 02/14/2019 4:52:33 PM By: Worthy Keeler PA-C Entered By: Worthy Keeler on 02/14/2019 11:10:12 -------------------------------------------------------------------------------- Physical Exam Details Patient Name: Date of Service: [name], [name] 02/14/2019 10:15 AM Medical Record [id number] Patient Account Number: [id number] Date of Birth/Sex: Sep 04, 1940 (78 y.o. M) Treating RN: Primary Care Provider: Garret Reddish Other Clinician: Referring Provider: Treating Provider/Extender:Stone III, Warner Mccreedy, Alexis Goodell in Treatment: 2 Constitutional Well-nourished and well-hydrated in no acute distress. Respiratory normal breathing without difficulty. Psychiatric this patient is able to make decisions and demonstrates good insight into disease process. Alert and Oriented x 3. pleasant and cooperative. Notes Patient's wound bed currently showed signs again of some slough noted although there was some good granulation poking through and several areas as well. Fortunately there is no signs of infection and overall I do feel like he is doing quite well from the standpoint of healing in fact I see some new skin growth around the edges of the wound today that we did not notice even before. Obviously I think the Santyl has been beneficial. Electronic Signature(s) Signed: 02/14/2019 4:52:33 PM By: Worthy Keeler PA-C Entered By: Worthy Keeler on 02/14/2019 11:10:38 -------------------------------------------------------------------------------- Physician Orders Details Patient Name: Date of Service: [name], [name] 02/14/2019 10:15 AM Medical Record [id number] Patient Account Number: [id number] Date of Birth/Sex: 02-24-1941 (78 y.o. M) Treating RN: Baruch Gouty Primary Care Provider: Garret Reddish Other Clinician: Referring Provider: Treating Provider/Extender:Stone  III, Warner Mccreedy, Alexis Goodell in Treatment: 2 Verbal / Phone Orders: No Diagnosis Coding ICD-10 Coding Code Description I87.2 Venous insufficiency (chronic) (peripheral) S81.802A Unspecified open wound, left lower leg, initial encounter L97.822 Non-pressure chronic ulcer of other part of left lower leg with fat layer exposed N18.4 Chronic kidney disease, stage 4 (severe) G90.09 Other idiopathic peripheral autonomic  neuropathy Follow-up Appointments Return Appointment in 1 week. Dressing Change Frequency Wound #1 Left,Posterior Lower Leg Do not change entire dressing for one week. Skin Barriers/Peri-Wound Care Moisturizing lotion - to leg Wound Cleansing May shower with protection. Primary Wound Dressing Wound #1 Left,Posterior Lower Leg Santyl Ointment Hydrogel - thin layer over santyl Secondary Dressing Wound #1 Left,Posterior Lower Leg Dry Gauze ABD pad Edema Control 3 Layer Compression System - Left Lower Extremity Avoid standing for long periods of time Elevate legs to the level of the heart or above for 30 minutes daily and/or when sitting, a frequency of: - throughout the week Exercise regularly Patient Medications Allergies: No Known Drug Allergies Notifications Medication Indication Start End benzocaine prior to 02/14/2019 debridement DOSE topical 20 % aerosol - aerosol topical Electronic Signature(s) Signed: 02/14/2019 3:14:28 PM By: Baruch Gouty RN, BSN Signed: 02/14/2019 4:52:33 PM By: Worthy Keeler PA-C Entered By: Baruch Gouty on 02/14/2019 11:06:27 -------------------------------------------------------------------------------- Problem List Details Patient Name: Date of Service: [name], [name] 02/14/2019 10:15 AM Medical Record [id number] Patient Account Number: [id number] Date of Birth/Sex: [birth year] (78 y.o. M) Treating RN: Primary Care Provider: Garret Reddish Other Clinician: Referring Provider: Treating  Provider/Extender:Stone III, Warner Mccreedy, Alexis Goodell in Treatment: 2 Active Problems ICD-10 Evaluated Encounter Code Description Active Date Today Diagnosis I87.2 Venous insufficiency (chronic) (peripheral) 01/31/2019 No Yes S81.802A Unspecified open wound, left lower leg, initial 01/31/2019 No Yes encounter L97.822 Non-pressure chronic ulcer of other part of left lower 01/31/2019 No Yes leg with fat layer exposed N18.4 Chronic kidney disease, stage 4 (severe) 01/31/2019 No Yes G90.09 Other idiopathic peripheral autonomic neuropathy 01/31/2019 No Yes Inactive Problems Resolved Problems Electronic Signature(s) Signed: 02/14/2019 4:52:33 PM By: Worthy Keeler PA-C Entered By: Worthy Keeler on 02/14/2019 11:01:00 -------------------------------------------------------------------------------- Progress Note Details Patient Name: Date of Service: [name], [name] 02/14/2019 10:15 AM Medical Record [id number] Patient Account Number: [id number] Date of Birth/Sex: 02-22-1941 (78 y.o. M) Treating RN: Primary Care Provider: Garret Reddish Other Clinician: Referring Provider: Treating Provider/Extender:Stone III, Warner Mccreedy, Alexis Goodell in Treatment: 2 Subjective Chief Complaint Information obtained from Patient Left LE Ulcer secondary to trauma but non-healing at this point History of Present Illness (HPI) 01/31/2019 on evaluation today patient actually appears to be doing poorly unfortunately secondary to an injury that occurred when he hit his leg on a car door September 9. He was seen in the ER where he had 13 sutures placed. His primary care provider remove those 2 weeks later and unfortunately the wound began to dehisce following. It appears that he may have had a skin flap that just did not take and survive. Nonetheless he was stated to have had Keflex given from the ER though the patient states he does not even remember that. He also is stated to have taken 2  rounds of doxycycline by his primary care provider but again the patient states he only remembers the most recent antibiotic which was actually the Augmentin then switched to Bactrim based on the culture results. The culture showed that he had Morganella Morgagni noted in the culture. Subsequently when he was taking the Bactrim he states that the redness got much better but then unfortunately worsened again once he came off of the medication. Apparently the patient was given triamcinolone ointment to put on the wound although obviously that really did not do much for the wound and again the patient tells me that he was "given the wrong ointment". Again I am not exactly sure what was going on in that  scenario 1 where another. Either way I do believe that he has necrotic tissue on the surface of the wound this can require some debridement if he is able to tolerate it and then subsequently he is also get a need likely a refill of the Bactrim in order to help treat the infection I do not believe this is completely cleared up at this point. I also think that he really needs to have edema control again he is very swollen and I believe that Performing a compression wrap will also be of great benefit for him. The patient does have chronic kidney disease stage IV along with peripheral neuropathy secondary to issues with his back. 02/07/2019 on evaluation today patient appears to be doing a little better with regard to the overall appearance of the wound although it appears to be very dry. Fortunately there is no signs of active infection at this time. No fever chills noted. I really feel like that the patient may be having issues at this point with the Iodoflex being too absorptive for him at this point which is causing this to dry out. I think we may want to try something different to see if that would be of benefit for him. 02/14/2019 on evaluation today patient actually appears to be doing much better  with regard to his wound at this time. He has been tolerating the dressing changes without complication. Fortunately everything appears to be doing much better from the standpoint of how soft the dead tissue is today in fact I was able to debride away some of the necrotic tissue because it was doing so well. Fortunately he is showing signs of good improvement as of this week. Patient History Information obtained from Patient. Family History Cancer - Mother,Maternal Grandparents,Siblings, Stroke - Father. Social History Never smoker, Marital Status - Divorced, Alcohol Use - Never, Drug Use - No History, Caffeine Use - Moderate. Medical History Eyes Patient has history of Cataracts - both eyes Respiratory Patient has history of Sleep Apnea - cpap at night Cardiovascular Patient has history of Congestive Heart Failure, Peripheral Venous Disease Musculoskeletal Patient has history of Osteoarthritis Neurologic Patient has history of Neuropathy Medical And Surgical History Notes Gastrointestinal fatty liver Genitourinary chronic kidney disease stg 4 Musculoskeletal Right knee and shoulder replacement, spinal fusion Psychiatric Mild depressed bipolar disorder Review of Systems (ROS) Constitutional Symptoms (General Health) Denies complaints or symptoms of Fatigue, Fever, Chills, Marked Weight Change. Respiratory Denies complaints or symptoms of Chronic or frequent coughs, Shortness of Breath. Cardiovascular Denies complaints or symptoms of Chest pain. Psychiatric Denies complaints or symptoms of Claustrophobia, Suicidal. Objective Constitutional Well-nourished and well-hydrated in no acute distress. Vitals Time Taken: 10:50 AM, Height: 74 in, Weight: 180 lbs, BMI: 23.1, Temperature: 98.0 F, Pulse: 96 bpm, Respiratory Rate: 19 breaths/min, Blood Pressure: 125/64 mmHg. Respiratory normal breathing without difficulty. Psychiatric this patient is able to make decisions and  demonstrates good insight into disease process. Alert and Oriented x 3. pleasant and cooperative. General Notes: Patient's wound bed currently showed signs again of some slough noted although there was some good granulation poking through and several areas as well. Fortunately there is no signs of infection and overall I do feel like he is doing quite well from the standpoint of healing in fact I see some new skin growth around the edges of the wound today that we did not notice even before. Obviously I think the Santyl has been beneficial. Integumentary (Hair, Skin) Wound #1 status is Open. Original  cause of wound was Trauma. The wound is located on the Left,Posterior Lower Leg. The wound measures 5.5cm length x 2.6cm width x 0.2cm depth; 11.231cm^2 area and 2.246cm^3 volume. There is Fat Layer (Subcutaneous Tissue) Exposed exposed. There is no tunneling or undermining noted. There is a medium amount of serosanguineous drainage noted. The wound margin is flat and intact. There is small (1-33%) pink granulation within the wound bed. There is a large (67-100%) amount of necrotic tissue within the wound bed including Adherent Slough. Assessment Active Problems ICD-10 Venous insufficiency (chronic) (peripheral) Unspecified open wound, left lower leg, initial encounter Non-pressure chronic ulcer of other part of left lower leg with fat layer exposed Chronic kidney disease, stage 4 (severe) Other idiopathic peripheral autonomic neuropathy Procedures Wound #1 Pre-procedure diagnosis of Wound #1 is a Venous Leg Ulcer located on the Left,Posterior Lower Leg .Severity of Tissue Pre Debridement is: Fat layer exposed. There was a Excisional Skin/Subcutaneous Tissue Debridement with a total area of 4.5 sq cm performed by Worthy Keeler, PA. With the following instrument(s): Curette to remove Viable and Non-Viable tissue/material. Material removed includes Subcutaneous Tissue and Slough and  after achieving pain control using Other (benzocaine 20% spray). No specimens were taken. A time out was conducted at 11:00, prior to the start of the procedure. A Minimum amount of bleeding was controlled with Pressure. The procedure was tolerated well with a pain level of 7 throughout and a pain level of 4 following the procedure. Post Debridement Measurements: 5.5cm length x 2.8cm width x 0.2cm depth; 2.419cm^3 volume. Character of Wound/Ulcer Post Debridement requires further debridement. Severity of Tissue Post Debridement is: Fat layer exposed. Post procedure Diagnosis Wound #1: Same as Pre-Procedure Pre-procedure diagnosis of Wound #1 is a Venous Leg Ulcer located on the Left,Posterior Lower Leg . There was a Three Layer Compression Therapy Procedure by [name], RN. Post procedure Diagnosis Wound #1: Same as Pre-Procedure Plan Follow-up Appointments: Return Appointment in 1 week. Dressing Change Frequency: Wound #1 Left,Posterior Lower Leg: Do not change entire dressing for one week. Skin Barriers/Peri-Wound Care: Moisturizing lotion - to leg Wound Cleansing: May shower with protection. Primary Wound Dressing: Wound #1 Left,Posterior Lower Leg: Santyl Ointment Hydrogel - thin layer over santyl Secondary Dressing: Wound #1 Left,Posterior Lower Leg: Dry Gauze ABD pad Edema Control: 3 Layer Compression System - Left Lower Extremity Avoid standing for long periods of time Elevate legs to the level of the heart or above for 30 minutes daily and/or when sitting, a frequency of: - throughout the week Exercise regularly The following medication(s) was prescribed: benzocaine topical 20 % aerosol aerosol topical for prior to debridement was prescribed at facility 1 I would recommend currently that we going to continue with the current wound care measures which includes utilizing the Santyl followed by thin layer of hydrogel over top of the Santyl. We then are applying a 3  layer compression wrap over top of this which is controlling the edema quite well. 2. I would recommend as well that we go ahead and have the patient continue as well to elevate his leg is much as he can I think that is good to help keep the edema under control which subsequently will also keep the leg moving the right direction from a healing perspective. 3. The patient actually did call his brother in order to have [place] talk with him as well to let him know what was going on. With that being said I did explain that the patient seems  to be healing nicely from what I see at this point. I think that we will continue down the current path and he was happy to hear that we are seeing some improvement at this point. We will see patient back for reevaluation in 1 week here in the clinic. If anything worsens or changes patient will contact our office for additional recommendations. Electronic Signature(s) Signed: 02/14/2019 4:52:33 PM By: Worthy Keeler PA-C Entered By: Worthy Keeler on 02/14/2019 11:11:43 -------------------------------------------------------------------------------- HxROS Details Patient Name: Date of Service: [name], [name] 02/14/2019 10:15 AM Medical Record [id number] Patient Account Number: [id number] Date of Birth/Sex: 02-04-1941 (78 y.o. M) Treating RN: Primary Care Provider: Garret Reddish Other Clinician: Referring Provider: Treating Provider/Extender:Stone III, Warner Mccreedy, Alexis Goodell in Treatment: 2 Information Obtained From Patient Constitutional Symptoms (General Health) Complaints and Symptoms: Negative for: Fatigue; Fever; Chills; Marked Weight Change Respiratory Complaints and Symptoms: Negative for: Chronic or frequent coughs; Shortness of Breath Medical History: Positive for: Sleep Apnea - cpap at night Cardiovascular Complaints and Symptoms: Negative for: Chest pain Medical History: Positive for: Congestive Heart Failure; Peripheral  Venous Disease Psychiatric Complaints and Symptoms: Negative for: Claustrophobia; Suicidal Medical History: Past Medical History Notes: Mild depressed bipolar disorder Eyes Medical History: Positive for: Cataracts - both eyes Gastrointestinal Medical History: Past Medical History Notes: fatty liver Genitourinary Medical History: Past Medical History Notes: chronic kidney disease stg 4 Musculoskeletal Medical History: Positive for: Osteoarthritis Past Medical History Notes: Right knee and shoulder replacement, spinal fusion Neurologic Medical History: Positive for: Neuropathy HBO Extended History Items Eyes: Cataracts Immunizations Pneumococcal Vaccine: Received Pneumococcal Vaccination: Yes Implantable Devices None Family and Social History Cancer: Yes - Mother,Maternal Grandparents,Siblings; Stroke: Yes - Father; Never smoker; Marital Status - Divorced; Alcohol Use: Never; Drug Use: No History; Caffeine Use: Moderate; Financial Concerns: No; Food, Clothing or Shelter Needs: No; Support System Lacking: No; Transportation Concerns: No Physician Affirmation I have reviewed and agree with the above information. Electronic Signature(s) Signed: 02/14/2019 4:52:33 PM By: Worthy Keeler PA-C Entered By: Worthy Keeler on 02/14/2019 11:10:27 -------------------------------------------------------------------------------- SuperBill Details Patient Name: Date of Service: [name], [name] 02/14/2019 Medical Record [id number] Patient Account Number: [id number] Date of Birth/Sex: Treating RN: 03-Mar-1941 (78 y.o. 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 leg with fat layer exposed N18.4 Chronic kidney disease, stage 4 (severe) G90.09 Other idiopathic peripheral autonomic neuropathy Facility Procedures CPT4 Code Description: [id number] 11042 - DEB SUBQ TISSUE 20 SQ CM/< ICD-10 Diagnosis Description L97.822 Non-pressure chronic ulcer of other part of left lower leg w Modifier: ith fat laye Quantity: 1 r exposed Physician Procedures CPT4 Code Description: [id number] 11042 - WC PHYS SUBQ TISS 20 SQ CM ICD-10 Diagnosis Description L97.822 Non-pressure chronic ulcer of other part of left lower leg w Modifier: ith fat laye Quantity: 1 r exposed Electronic Signature(s) Signed: 02/14/2019 4:52:33 PM By: Worthy Keeler PA-C Entered By: Worthy Keeler on 02/14/2019 11:11:50

## 2019-02-14 NOTE — Progress Notes (Addendum)
Kelon, Easom Devaunte ([id number]) Visit Report for 02/14/2019 Arrival Information Details Patient Name: Date of Service: INMER, NIX 02/14/2019 10:15 AM Medical Record [id number] Patient Account Number: [id number] Date of Birth/Sex: 27-Mar-1940 (78 y.o. M) Treating RN: Kela Millin Primary Care Eston Heslin: Garret Reddish Other Clinician: Referring Loie Jahr: Treating Chavez Rosol/Extender:Stone III, Warner Mccreedy, Alexis Goodell in Treatment: 2 Visit Information History Since Last Visit Added or deleted any medications: No Patient Arrived: Ambulatory Any new allergies or adverse reactions: No Arrival Time: 10:46 Had a fall or experienced change in No Accompanied By: self activities of daily living that may affect Transfer Assistance: None risk of falls: Patient Identification Verified: Yes Signs or symptoms of abuse/neglect since last No Secondary Verification Process Yes visito Completed: Hospitalized since last visit: No Patient Requires Transmission-Based No Implantable device outside of the clinic excluding No Precautions: cellular tissue based products placed in the center Patient Has Alerts: No since last visit: Has Dressing in Place as Prescribed: Yes Has Compression in Place as Prescribed: Yes Pain Present Now: No Electronic Signature(s) Signed: 02/14/2019 3:17:43 PM By: Kela Millin Entered By: Kela Millin on 02/14/2019 10:48:13 -------------------------------------------------------------------------------- Compression Therapy Details Patient Name: Date of Service: VIRAT, PRATHER 02/14/2019 10:15 AM Medical Record [id number] Patient Account Number: [id number] Date of Birth/Sex: [birth year] (78 y.o. M) Treating RN: Baruch Gouty Primary Care Karen Kinnard: Garret Reddish Other Clinician: Referring Kalei Meda: Treating Arin Vanosdol/Extender:Stone III, Warner Mccreedy, Alexis Goodell in Treatment: 2 Compression Therapy Performed for Wound Wound #1  Left,Posterior Lower Leg Assessment: Performed By: Clinician [name], RN Compression Type: Three Layer Post Procedure Diagnosis Same as Pre-procedure Electronic Signature(s) Signed: 02/14/2019 3:14:28 PM By: Baruch Gouty RN, BSN Entered By: Baruch Gouty on 02/14/2019 11:03:45 -------------------------------------------------------------------------------- Encounter Discharge Information Details Patient Name: Date of Service: Jove, Beyl Dresden 02/14/2019 10:15 AM Medical Record [id number] Patient Account Number: [id number] Date of Birth/Sex: 1941/01/14 (78 y.o. M) Treating RN: Kela Millin Primary Care Yassen Kinnett: Garret Reddish Other Clinician: Referring Genesi Stefanko: Treating Javonna Balli/Extender:Stone III, Warner Mccreedy, Alexis Goodell in Treatment: 2 Encounter Discharge Information Items Post Procedure Vitals Discharge Condition: Stable Temperature (F): 98 Ambulatory Status: Ambulatory Pulse (bpm): 96 Discharge Destination: Home Respiratory Rate (breaths/min): 19 Transportation: Private Auto Blood Pressure (mmHg): 125/64 Accompanied By: self Schedule Follow-up Appointment: Yes Clinical Summary of Care: Patient Declined Electronic Signature(s) Signed: 02/14/2019 3:17:43 PM By: Kela Millin Entered By: Kela Millin on 02/14/2019 11:18:38 -------------------------------------------------------------------------------- Lower Extremity Assessment Details Patient Name: Date of Service: ERLING, ARRAZOLA 02/14/2019 10:15 AM Medical Record [id number] Patient Account Number: [id number] Date of Birth/Sex: 03/28/1940 (78 y.o. M) Treating RN: Kela Millin Primary Care Eureka Valdes: Garret Reddish Other Clinician: Referring Deakon Frix: Treating Ohn Bostic/Extender:Stone III, Warner Mccreedy, Alexis Goodell in Treatment: 2 Edema Assessment Assessed: [Left: No] [Right: No] Edema: [Left: Ye] [Right: s] Calf Left: Right: Point of Measurement: 31 cm From Medial  Instep 37.5 cm cm Ankle Left: Right: Point of Measurement: 12 cm From Medial Instep 25 cm cm Vascular Assessment Pulses: Dorsalis Pedis Palpable: [Left:Yes] Electronic Signature(s) Signed: 02/14/2019 3:17:43 PM By: Kela Millin Entered By: Kela Millin on 02/14/2019 10:51:28 -------------------------------------------------------------------------------- Multi-Disciplinary Care Plan Details Patient Name: Date of Service: Cordelro, [place] Nishawn 02/14/2019 10:15 AM Medical Record [id number] Patient Account Number: [id number] Date of Birth/Sex: [birth year] (78 y.o. M) Treating RN: Baruch Gouty Primary Care Nori Winegar: Garret Reddish Other Clinician: Referring Hula Tasso: Treating Caron Tardif/Extender:Stone III, Warner Mccreedy, Alexis Goodell in Treatment: 2 Active Inactive Venous Leg Ulcer Nursing Diagnoses: Knowledge deficit related to disease process and management Potential for venous Insuffiency (use before diagnosis confirmed) Goals: Patient will maintain optimal edema control  Date Initiated: 01/31/2019 Target Resolution Date: 02/28/2019 Goal Status: Active Interventions: Assess peripheral edema status every visit. Compression as ordered Treatment Activities: Therapeutic compression applied : 01/31/2019 Notes: Wound/Skin Impairment Nursing Diagnoses: Impaired tissue integrity Knowledge deficit related to ulceration/compromised skin integrity Goals: Patient/caregiver will verbalize understanding of skin care regimen Date Initiated: 01/31/2019 Target Resolution Date: 02/28/2019 Goal Status: Active Ulcer/skin breakdown will have a volume reduction of 30% by week 4 Date Initiated: 01/31/2019 Target Resolution Date: 02/28/2019 Goal Status: Active Interventions: Assess patient/caregiver ability to obtain necessary supplies Assess patient/caregiver ability to perform ulcer/skin care regimen upon admission and as needed Assess ulceration(s) every visit Treatment  Activities: Skin care regimen initiated : 01/31/2019 Topical wound management initiated : 01/31/2019 Notes: Electronic Signature(s) Signed: 02/14/2019 3:14:28 PM By: Baruch Gouty RN, BSN Entered By: Baruch Gouty on 02/14/2019 11:02:34 -------------------------------------------------------------------------------- Pain Assessment Details Patient Name: Date of Service: Suhaib, Guzzo Ricke 02/14/2019 10:15 AM Medical Record [id number] Patient Account Number: [id number] Date of Birth/Sex: 01/17/1941 (78 y.o. M) Treating RN: Kela Millin Primary Care Avea Mcgowen: Garret Reddish Other Clinician: Referring Keymora Grillot: Treating Eimy Plaza/Extender:Stone III, Warner Mccreedy, Alexis Goodell in Treatment: 2 Active Problems Location of Pain Severity and Description of Pain Patient Has Paino No Site Locations Pain Management and Medication Current Pain Management: Electronic Signature(s) Signed: 02/14/2019 3:17:43 PM By: Kela Millin Entered By: Kela Millin on 02/14/2019 10:50:59 -------------------------------------------------------------------------------- Patient/Caregiver Education Details Patient Name: [name], Jibreel 11/25/2020andnbsp10:15 Date of Service: AM Medical Record [id number] Number: Patient Account Number: [id number] Treating RN: 01/18/1941 (78 y.o. Baruch Gouty Date of Birth/Gender: M) Other Clinician: Primary Care Treating Hunter, Rosilyn Mings Physician: Physician/Extender: Referring Physician: Kaylyn Layer in Treatment: 2 Education Assessment Education Provided To: Patient Education Topics Provided Venous: Methods: Explain/Verbal Responses: Reinforcements needed, State content correctly Wound/Skin Impairment: Methods: Explain/Verbal Responses: Reinforcements needed, State content correctly Electronic Signature(s) Signed: 02/14/2019 3:14:28 PM By: Baruch Gouty RN, BSN Entered By: Baruch Gouty on 02/14/2019  11:03:26 -------------------------------------------------------------------------------- Wound Assessment Details Patient Name: Date of Service: Harbor, Vanover Jaquarius 02/14/2019 10:15 AM Medical Record [id number] Patient Account Number: [id number] Date of Birth/Sex: 09/11/1940 (78 y.o. M) Treating RN: Kela Millin Primary Care Oddis Westling: Garret Reddish Other Clinician: Referring Maliya Marich: Treating Adelynn Gipe/Extender:Stone III, Warner Mccreedy, Alexis Goodell in Treatment: 2 Wound Status Wound Number: 1 Primary Venous Leg Ulcer Etiology: Wound Location: Left Lower Leg - Posterior Wound Open Wounding Event: Trauma Status: Date Acquired: 11/29/2018 Comorbid Cataracts, Sleep Apnea, Congestive Heart Weeks Of Treatment: 2 History: Failure, Peripheral Venous Disease, Clustered Wound: No Osteoarthritis, Neuropathy Photos Wound Measurements Length: (cm) 5.5 % Reduct Width: (cm) 2.6 % Reduct Depth: (cm) 0.2 Epitheli Area: (cm) 11.231 Tunneli Volume: (cm) 2.246 Undermi Wound Description Full Thickness Without Exposed Support Foul Odo Classification: Structures Slough/F Wound Flat and Intact Margin: Exudate Medium Amount: Exudate Serosanguineous Type: Exudate red, brown Color: Wound Bed Granulation Amount: Small (1-33%) Granulation Quality: Pink Fascia E Necrotic Amount: Large (67-100%) Fat Laye Necrotic Quality: Adherent Slough Tendon E Muscle Exp Joint Expo Bone Expos r After Cleansing: No ibrino Yes Exposed Structure xposed: No r (Subcutaneous Tissue) Exposed: Yes xposed: No osed: No sed: No ed: No ion in Area: 8.7% ion in Volume: 8.7% alization: None ng: No ning: No Treatment Notes Wound #1 (Left, Posterior Lower Leg) 1. Cleanse With Wound Cleanser Soap and water 2. Periwound Care Moisturizing lotion Skin Prep 3. Primary Dressing Applied Hydrogel or K-Y Jelly Santyl 4. Secondary Dressing ABD Pad Dry Gauze 6. Support Layer Applied 3 layer  compression wrap Notes netting. Electronic Signature(s) Signed: 02/19/2019  4:19:23 PM By: Mikeal Hawthorne EMT/HBOT Signed: 02/20/2019 7:10:15 AM By: Kela Millin Previous Signature: 02/14/2019 3:17:43 PM Version By: Kela Millin Entered By: Mikeal Hawthorne on 02/19/2019 10:38:12 -------------------------------------------------------------------------------- Vitals Details Patient Name: Date of Service: Elick, Aguilera Darris 02/14/2019 10:15 AM Medical Record [id number] Patient Account Number: [id number] Date of Birth/Sex: [birth year] (78 y.o. M) Treating RN: Kela Millin Primary Care Idaly Verret: Garret Reddish Other Clinician: Referring Mariajose Mow: Treating Kaveri Perras/Extender:Stone III, Warner Mccreedy, Alexis Goodell in Treatment: 2 Vital Signs Time Taken: 10:50 Temperature (F): 98.0 Height (in): 74 Pulse (bpm): 96 Weight (lbs): 180 Respiratory Rate (breaths/min): 19 Body Mass Index (BMI): 23.1 Blood Pressure (mmHg): 125/64 Reference Range: 80 - 120 mg / dl Electronic Signature(s) Signed: 02/14/2019 3:17:43 PM By: Kela Millin Entered By: Kela Millin on 02/14/2019 10:50:53

## 2019-02-19 DIAGNOSIS — H2512 Age-related nuclear cataract, left eye: Secondary | ICD-10-CM | POA: Diagnosis not present

## 2019-02-19 DIAGNOSIS — H25812 Combined forms of age-related cataract, left eye: Secondary | ICD-10-CM | POA: Diagnosis not present

## 2019-02-21 ENCOUNTER — Encounter (HOSPITAL_BASED_OUTPATIENT_CLINIC_OR_DEPARTMENT_OTHER): Payer: Medicare Other | Attending: Physician Assistant | Admitting: Physician Assistant

## 2019-02-21 ENCOUNTER — Other Ambulatory Visit: Payer: Self-pay

## 2019-02-21 DIAGNOSIS — Z96651 Presence of right artificial knee joint: Secondary | ICD-10-CM | POA: Insufficient documentation

## 2019-02-21 DIAGNOSIS — L97822 Non-pressure chronic ulcer of other part of left lower leg with fat layer exposed: Secondary | ICD-10-CM | POA: Insufficient documentation

## 2019-02-21 DIAGNOSIS — Z96611 Presence of right artificial shoulder joint: Secondary | ICD-10-CM | POA: Diagnosis not present

## 2019-02-21 DIAGNOSIS — G629 Polyneuropathy, unspecified: Secondary | ICD-10-CM | POA: Insufficient documentation

## 2019-02-21 DIAGNOSIS — L97222 Non-pressure chronic ulcer of left calf with fat layer exposed: Secondary | ICD-10-CM | POA: Diagnosis not present

## 2019-02-21 DIAGNOSIS — I872 Venous insufficiency (chronic) (peripheral): Secondary | ICD-10-CM | POA: Insufficient documentation

## 2019-02-21 DIAGNOSIS — G4730 Sleep apnea, unspecified: Secondary | ICD-10-CM | POA: Insufficient documentation

## 2019-02-21 DIAGNOSIS — N184 Chronic kidney disease, stage 4 (severe): Secondary | ICD-10-CM | POA: Diagnosis not present

## 2019-02-21 DIAGNOSIS — G9009 Other idiopathic peripheral autonomic neuropathy: Secondary | ICD-10-CM | POA: Insufficient documentation

## 2019-02-21 DIAGNOSIS — I509 Heart failure, unspecified: Secondary | ICD-10-CM | POA: Diagnosis not present

## 2019-02-22 NOTE — Progress Notes (Signed)
[name], [name] ([id number]) Visit Report for 02/21/2019 Chief Complaint Document Details Patient Name: Date of Service: [name], [name] 02/21/2019 3:15 PM Medical Record [id number] Patient Account Number: [id number] Date of Birth/Sex: Treating RN: 1941/02/10 (78 y.o. M) Primary Care Provider: Garret Reddish Other Clinician: Referring Provider: Treating Provider/Extender:Stone III, Warner Mccreedy, Alexis Goodell in Treatment: 3 Information Obtained from: Patient Chief Complaint Left LE Ulcer secondary to trauma but non-healing at this point Electronic Signature(s) Signed: 02/22/2019 10:36:32 AM By: Worthy Keeler PA-C Entered By: Worthy Keeler on 02/21/2019 16:02:37 -------------------------------------------------------------------------------- Debridement Details Patient Name: Date of Service: [name], [name] 02/21/2019 3:15 PM Medical Record [id number] Patient Account Number: [id number] Date of Birth/Sex: May 12, 1940 (78 y.o. M) Treating RN: Baruch Gouty Primary Care Provider: Garret Reddish Other Clinician: Referring Provider: Treating Provider/Extender:Stone III, Warner Mccreedy, Alexis Goodell in Treatment: 3 Debridement Performed for Wound #1 Left,Posterior Lower Leg Assessment: Performed By: Physician Worthy Keeler, PA Debridement Type: Debridement Severity of Tissue Pre Fat layer exposed Debridement: Level of Consciousness (Pre- Awake and Alert procedure): Pre-procedure Verification/Time Out Taken: Yes - 16:55 Start Time: 16:56 Pain Control: Lidocaine 4% Topical Solution Total Area Debrided (L x W): 3.5 (cm) x 2 (cm) = 7 (cm) Tissue and other material Viable, Non-Viable, Slough, Subcutaneous, Slough debrided: Level: Skin/Subcutaneous Tissue Debridement Description: Excisional Instrument: Curette Bleeding: Minimum Hemostasis Achieved: Pressure End Time: 17:00 Procedural Pain: 5 Post Procedural Pain: 3 Response to Treatment: Procedure was  tolerated well Level of Consciousness Awake and Alert (Post-procedure): Post Debridement Measurements of Total Wound Length: (cm) 3.5 Width: (cm) 2 Depth: (cm) 0.2 Volume: (cm) 1.1 Character of Wound/Ulcer Post Improved Debridement: Severity of Tissue Post Debridement: Fat layer exposed Post Procedure Diagnosis Same as Pre-procedure Electronic Signature(s) Signed: 02/21/2019 6:22:02 PM By: Baruch Gouty RN, BSN Signed: 02/22/2019 10:36:32 AM By: Worthy Keeler PA-C Entered By: Baruch Gouty on 02/21/2019 17:01:09 -------------------------------------------------------------------------------- HPI Details Patient Name: Date of Service: [name], [name] 02/21/2019 3:15 PM Medical Record [id number] Patient Account Number: [id number] Date of Birth/Sex: Treating RN: Jun 19, 1940 (78 y.o. M) Primary Care Provider: Garret Reddish Other Clinician: Referring Provider: Treating Provider/Extender:Stone III, Warner Mccreedy, Alexis Goodell in Treatment: 3 History of Present Illness HPI Description: 01/31/2019 on evaluation today patient actually appears to be doing poorly unfortunately secondary to an injury that occurred when he hit his leg on a car door September 9. He was seen in the ER where he had 13 sutures placed. His primary care provider remove those 2 weeks later and unfortunately the wound began to dehisce following. It appears that he may have had a skin flap that just did not take and survive. Nonetheless he was stated to have had Keflex given from the ER though the patient states he does not even remember that. He also is stated to have taken 2 rounds of doxycycline by his primary care provider but again the patient states he only remembers the most recent antibiotic which was actually the Augmentin then switched to Bactrim based on the culture results. The culture showed that he had Morganella Morgagni noted in the culture. Subsequently when he was taking the Bactrim he  states that the redness got much better but then unfortunately worsened again once he came off of the medication. Apparently the patient was given triamcinolone ointment to put on the wound although obviously that really did not do much for the wound and again the patient tells me that he was "given the wrong ointment". Again I am not exactly sure what was going on in  that scenario 1 where another. Either way I do believe that he has necrotic tissue on the surface of the wound this can require some debridement if he is able to tolerate it and then subsequently he is also get a need likely a refill of the Bactrim in order to help treat the infection I do not believe this is completely cleared up at this point. I also think that he really needs to have edema control again he is very swollen and I believe that Performing a compression wrap will also be of great benefit for him. The patient does have chronic kidney disease stage IV along with peripheral neuropathy secondary to issues with his back. 02/07/2019 on evaluation today patient appears to be doing a little better with regard to the overall appearance of the wound although it appears to be very dry. Fortunately there is no signs of active infection at this time. No fever chills noted. I really feel like that the patient may be having issues at this point with the Iodoflex being too absorptive for him at this point which is causing this to dry out. I think we may want to try something different to see if that would be of benefit for him. 02/14/2019 on evaluation today patient actually appears to be doing much better with regard to his wound at this time. He has been tolerating the dressing changes without complication. Fortunately everything appears to be doing much better from the standpoint of how soft the dead tissue is today in fact I was able to debride away some of the necrotic tissue because it was doing so well. Fortunately he is showing  signs of good improvement as of this week. 02/21/2019 on evaluation today patient feels like he is actually doing a little bit worse noticing the redness on his lower extremity. I agree I have not seen this before and I am concerned about the possibility of infection. I think we may want to initiate something such as doxycycline and possibly wound culture today to see what exactly may be going on here. Electronic Signature(s) Signed: 02/22/2019 10:36:32 AM By: Worthy Keeler PA-C Entered By: Worthy Keeler on 02/21/2019 17:05:23 -------------------------------------------------------------------------------- Physical Exam Details Patient Name: Date of Service: STAFFORD, RIVIERA 02/21/2019 3:15 PM Medical Record [id number] Patient Account Number: [id number] Date of Birth/Sex: Treating RN: 29-Jul-1940 (78 y.o. M) Primary Care Provider: Garret Reddish Other Clinician: Referring Provider: Treating Provider/Extender:Stone III, Warner Mccreedy, Alexis Goodell in Treatment: 3 Constitutional Well-nourished and well-hydrated in no acute distress. Respiratory normal breathing without difficulty. clear to auscultation bilaterally. Cardiovascular regular rate and rhythm with normal S1, S2. Psychiatric this patient is able to make decisions and demonstrates good insight into disease process. Alert and Oriented x 3. pleasant and cooperative. Notes Patient's wound bed currently did have some slough noted fortunately this is loosening up and finally I am able to actually debride some of this away without causing too much discomfort. The patient tolerated the debridement today without complication post sharp debridement wound bed does appear to be doing better though not completely clean this is definitely making progress. Electronic Signature(s) Signed: 02/22/2019 10:36:32 AM By: Worthy Keeler PA-C Entered By: Worthy Keeler on 02/21/2019  17:05:53 -------------------------------------------------------------------------------- Physician Orders Details Patient Name: Date of Service: Carver, Murakami Baraa 02/21/2019 3:15 PM Medical Record [id number] Patient Account Number: [id number] Date of Birth/Sex: Treating RN: December 05, 1940 (78 y.o. [name] Primary Care Provider: Garret Reddish Other Clinician: Referring Provider: Treating Provider/Extender:Stone III, Warner Mccreedy, Annie Main  Weeks in Treatment: 3 Verbal / Phone Orders: No Diagnosis Coding ICD-10 Coding Code Description I87.2 Venous insufficiency (chronic) (peripheral) S81.802A Unspecified open wound, left lower leg, initial encounter L97.822 Non-pressure chronic ulcer of other part of left lower leg with fat layer exposed N18.4 Chronic kidney disease, stage 4 (severe) G90.09 Other idiopathic peripheral autonomic neuropathy Follow-up Appointments Return Appointment in 1 week. Dressing Change Frequency Wound #1 Left,Posterior Lower Leg Do not change entire dressing for one week. Skin Barriers/Peri-Wound Care Moisturizing lotion - to leg Wound Cleansing May shower with protection. Primary Wound Dressing Wound #1 Left,Posterior Lower Leg Santyl Ointment Hydrogel - thin layer over santyl Secondary Dressing Wound #1 Left,Posterior Lower Leg Dry Gauze ABD pad Edema Control 3 Layer Compression System - Left Lower Extremity Avoid standing for long periods of time Elevate legs to the level of the heart or above for 30 minutes daily and/or when sitting, a frequency of: - throughout the week Exercise regularly Laboratory Bacteria identified in Unspecified specimen by Anaerobe culture (MICRO) - left lower leg LOINC Code: 093-2 Convenience Name: Anerobic culture Patient Medications Allergies: No Known Drug Allergies Notifications Medication Indication Start End doxycycline hyclate 02/21/2019 DOSE 1 - oral 100 mg capsule - 1 capsule oral taken 2 times a  day for 14 days. Do not take calcium or iron supplements while on this medication Electronic Signature(s) Signed: 02/21/2019 5:07:35 PM By: Worthy Keeler PA-C Entered By: Worthy Keeler on 02/21/2019 17:07:35 -------------------------------------------------------------------------------- Problem List Details Patient Name: Date of Service: Olivia, Royse Luz 02/21/2019 3:15 PM Medical Record [id number] Patient Account Number: [id number] Date of Birth/Sex: Treating RN: [birth year] (78 y.o. M) Primary Care Provider: Garret Reddish Other Clinician: Referring Provider: Treating Provider/Extender:Stone III, Warner Mccreedy, Alexis Goodell in Treatment: 3 Active Problems ICD-10 Evaluated Encounter Code Description Active Date Today Diagnosis I87.2 Venous insufficiency (chronic) (peripheral) 01/31/2019 No Yes S81.802A Unspecified open wound, left lower leg, initial 01/31/2019 No Yes encounter L97.822 Non-pressure chronic ulcer of other part of left lower 01/31/2019 No Yes leg with fat layer exposed N18.4 Chronic kidney disease, stage 4 (severe) 01/31/2019 No Yes G90.09 Other idiopathic peripheral autonomic neuropathy 01/31/2019 No Yes Inactive Problems Resolved Problems Electronic Signature(s) Signed: 02/22/2019 10:36:32 AM By: Worthy Keeler PA-C Entered By: Worthy Keeler on 02/21/2019 16:02:32 -------------------------------------------------------------------------------- Progress Note Details Patient Name: Date of Service: Tyreek, Clabo Miguelangel 02/21/2019 3:15 PM Medical Record [id number] Patient Account Number: [id number] Date of Birth/Sex: Treating RN: [birth year] (78 y.o. M) Primary Care Provider: Garret Reddish Other Clinician: Referring Provider: Treating Provider/Extender:Stone III, Warner Mccreedy, Alexis Goodell in Treatment: 3 Subjective Chief Complaint Information obtained from Patient Left LE Ulcer secondary to trauma but non-healing at this point History of  Present Illness (HPI) 01/31/2019 on evaluation today patient actually appears to be doing poorly unfortunately secondary to an injury that occurred when he hit his leg on a car door September 9. He was seen in the ER where he had 13 sutures placed. His primary care provider remove those 2 weeks later and unfortunately the wound began to dehisce following. It appears that he may have had a skin flap that just did not take and survive. Nonetheless he was stated to have had Keflex given from the ER though the patient states he does not even remember that. He also is stated to have taken 2 rounds of doxycycline by his primary care provider but again the patient states he only remembers the most recent antibiotic which was actually the Augmentin then switched to Bactrim based on the  culture results. The culture showed that he had Morganella Morgagni noted in the culture. Subsequently when he was taking the Bactrim he states that the redness got much better but then unfortunately worsened again once he came off of the medication. Apparently the patient was given triamcinolone ointment to put on the wound although obviously that really did not do much for the wound and again the patient tells me that he was "given the wrong ointment". Again I am not exactly sure what was going on in that scenario 1 where another. Either way I do believe that he has necrotic tissue on the surface of the wound this can require some debridement if he is able to tolerate it and then subsequently he is also get a need likely a refill of the Bactrim in order to help treat the infection I do not believe this is completely cleared up at this point. I also think that he really needs to have edema control again he is very swollen and I believe that Performing a compression wrap will also be of great benefit for him. The patient does have chronic kidney disease stage IV along with peripheral neuropathy secondary to issues with his  back. 02/07/2019 on evaluation today patient appears to be doing a little better with regard to the overall appearance of the wound although it appears to be very dry. Fortunately there is no signs of active infection at this time. No fever chills noted. I really feel like that the patient may be having issues at this point with the Iodoflex being too absorptive for him at this point which is causing this to dry out. I think we may want to try something different to see if that would be of benefit for him. 02/14/2019 on evaluation today patient actually appears to be doing much better with regard to his wound at this time. He has been tolerating the dressing changes without complication. Fortunately everything appears to be doing much better from the standpoint of how soft the dead tissue is today in fact I was able to debride away some of the necrotic tissue because it was doing so well. Fortunately he is showing signs of good improvement as of this week. 02/21/2019 on evaluation today patient feels like he is actually doing a little bit worse noticing the redness on his lower extremity. I agree I have not seen this before and I am concerned about the possibility of infection. I think we may want to initiate something such as doxycycline and possibly wound culture today to see what exactly may be going on here. Patient History Information obtained from Patient. Family History Cancer - Mother,Maternal Grandparents,Siblings, Stroke - Father. Social History Never smoker, Marital Status - Divorced, Alcohol Use - Never, Drug Use - No History, Caffeine Use - Moderate. Medical History Eyes Patient has history of Cataracts - both eyes Respiratory Patient has history of Sleep Apnea - cpap at night Cardiovascular Patient has history of Congestive Heart Failure, Peripheral Venous Disease Musculoskeletal Patient has history of Osteoarthritis Neurologic Patient has history of Neuropathy Medical And  Surgical History Notes Gastrointestinal fatty liver Genitourinary chronic kidney disease stg 4 Musculoskeletal Right knee and shoulder replacement, spinal fusion Psychiatric Mild depressed bipolar disorder Review of Systems (ROS) Constitutional Symptoms (General Health) Denies complaints or symptoms of Fatigue, Fever, Chills, Marked Weight Change. Respiratory Denies complaints or symptoms of Chronic or frequent coughs, Shortness of Breath. Cardiovascular Denies complaints or symptoms of Chest pain. Psychiatric Denies complaints or symptoms of Claustrophobia,  Suicidal. Objective Constitutional Well-nourished and well-hydrated in no acute distress. Vitals Time Taken: 4:20 PM, Height: 74 in, Weight: 180 lbs, BMI: 23.1, Temperature: 97.8 F, Pulse: 78 bpm, Respiratory Rate: 18 breaths/min, Blood Pressure: 121/65 mmHg. Respiratory normal breathing without difficulty. clear to auscultation bilaterally. Cardiovascular regular rate and rhythm with normal S1, S2. Psychiatric this patient is able to make decisions and demonstrates good insight into disease process. Alert and Oriented x 3. pleasant and cooperative. General Notes: Patient's wound bed currently did have some slough noted fortunately this is loosening up and finally I am able to actually debride some of this away without causing too much discomfort. The patient tolerated the debridement today without complication post sharp debridement wound bed does appear to be doing better though not completely clean this is definitely making progress. Integumentary (Hair, Skin) Wound #1 status is Open. Original cause of wound was Trauma. The wound is located on the Left,Posterior Lower Leg. The wound measures 3.5cm length x 2cm width x 0.1cm depth; 5.498cm^2 area and 0.55cm^3 volume. There is Fat Layer (Subcutaneous Tissue) Exposed exposed. There is no tunneling or undermining noted. There is a medium amount of serosanguineous drainage  noted. The wound margin is flat and intact. There is medium (34-66%) pink granulation within the wound bed. There is a medium (34-66%) amount of necrotic tissue within the wound bed including Adherent Slough. Assessment Active Problems ICD-10 Venous insufficiency (chronic) (peripheral) Unspecified open wound, left lower leg, initial encounter Non-pressure chronic ulcer of other part of left lower leg with fat layer exposed Chronic kidney disease, stage 4 (severe) Other idiopathic peripheral autonomic neuropathy Procedures Wound #1 Pre-procedure diagnosis of Wound #1 is a Venous Leg Ulcer located on the Left,Posterior Lower Leg .Severity of Tissue Pre Debridement is: Fat layer exposed. There was a Excisional Skin/Subcutaneous Tissue Debridement with a total area of 7 sq cm performed by Worthy Keeler, PA. With the following instrument(s): Curette to remove Viable and Non-Viable tissue/material. Material removed includes Subcutaneous Tissue and Slough and after achieving pain control using Lidocaine 4% Topical Solution. No specimens were taken. A time out was conducted at 16:55, prior to the start of the procedure. A Minimum amount of bleeding was controlled with Pressure. The procedure was tolerated well with a pain level of 5 throughout and a pain level of 3 following the procedure. Post Debridement Measurements: 3.5cm length x 2cm width x 0.2cm depth; 1.1cm^3 volume. Character of Wound/Ulcer Post Debridement is improved. Severity of Tissue Post Debridement is: Fat layer exposed. Post procedure Diagnosis Wound #1: Same as Pre-Procedure Pre-procedure diagnosis of Wound #1 is a Venous Leg Ulcer located on the Left,Posterior Lower Leg . There was a Three Layer Compression Therapy Procedure by Carlene Coria, RN. Post procedure Diagnosis Wound #1: Same as Pre-Procedure Plan Follow-up Appointments: Return Appointment in 1 week. Dressing Change Frequency: Wound #1 Left,Posterior Lower  Leg: Do not change entire dressing for one week. Skin Barriers/Peri-Wound Care: Moisturizing lotion - to leg Wound Cleansing: May shower with protection. Primary Wound Dressing: Wound #1 Left,Posterior Lower Leg: Santyl Ointment Hydrogel - thin layer over santyl Secondary Dressing: Wound #1 Left,Posterior Lower Leg: Dry Gauze ABD pad Edema Control: 3 Layer Compression System - Left Lower Extremity Avoid standing for long periods of time Elevate legs to the level of the heart or above for 30 minutes daily and/or when sitting, a frequency of: - throughout the week Exercise regularly Laboratory ordered were: Anerobic culture - left lower leg The following medication(s) was prescribed: doxycycline  hyclate oral 100 mg capsule 1 1 capsule oral taken 2 times a day for 14 days. Do not take calcium or iron supplements while on this medication starting 02/21/2019 1. My suggestion currently is good to be that we go ahead and initiate treatment with doxycycline I did obtain a wound culture as well and based on the results of the culture will make any changes we need to at that point. 2. With regard to the dressings I would recommend that we continue with the Santyl dressing as we have been over the past couple of weeks this seems to be doing well and has finally loosened up a lot of the necrotic tissue that I was able to clean away. 3. With regard to edema control I think elevation is still good to be beneficial for the patient obviously this can help along with the compression wrap. We will see patient back for reevaluation in 1 week here in the clinic. If anything worsens or changes patient will contact our office for additional recommendations. Electronic Signature(s) Signed: 02/22/2019 10:36:32 AM By: Worthy Keeler PA-C Entered By: Worthy Keeler on 02/21/2019 17:07:48 -------------------------------------------------------------------------------- HxROS Details Patient Name: Date of  Service: Tylik, Treese Saunders 02/21/2019 3:15 PM Medical Record [id number] Patient Account Number: [id number] Date of Birth/Sex: Treating RN: May 14, 1940 (78 y.o. M) Primary Care Provider: Garret Reddish Other Clinician: Referring Provider: Treating Provider/Extender:Stone III, Warner Mccreedy, Alexis Goodell in Treatment: 3 Information Obtained From Patient Constitutional Symptoms (General Health) Complaints and Symptoms: Negative for: Fatigue; Fever; Chills; Marked Weight Change Respiratory Complaints and Symptoms: Negative for: Chronic or frequent coughs; Shortness of Breath Medical History: Positive for: Sleep Apnea - cpap at night Cardiovascular Complaints and Symptoms: Negative for: Chest pain Medical History: Positive for: Congestive Heart Failure; Peripheral Venous Disease Psychiatric Complaints and Symptoms: Negative for: Claustrophobia; Suicidal Medical History: Past Medical History Notes: Mild depressed bipolar disorder Eyes Medical History: Positive for: Cataracts - both eyes Gastrointestinal Medical History: Past Medical History Notes: fatty liver Genitourinary Medical History: Past Medical History Notes: chronic kidney disease stg 4 Musculoskeletal Medical History: Positive for: Osteoarthritis Past Medical History Notes: Right knee and shoulder replacement, spinal fusion Neurologic Medical History: Positive for: Neuropathy HBO Extended History Items Eyes: Cataracts Immunizations Pneumococcal Vaccine: Received Pneumococcal Vaccination: Yes Implantable Devices None Family and Social History Cancer: Yes - Mother,Maternal Grandparents,Siblings; Stroke: Yes - Father; Never smoker; Marital Status - Divorced; Alcohol Use: Never; Drug Use: No History; Caffeine Use: Moderate; Financial Concerns: No; Food, Clothing or Shelter Needs: No; Support System Lacking: No; Transportation Concerns: No Physician Affirmation I have reviewed and agree with the above  information. Electronic Signature(s) Signed: 02/22/2019 10:36:32 AM By: Worthy Keeler PA-C Entered By: Worthy Keeler on 02/21/2019 17:05:38 -------------------------------------------------------------------------------- SuperBill Details Patient Name: Date of Service: Alby, Schwabe Giulian 02/21/2019 Medical Record [id number] Patient Account Number: [id number] Date of Birth/Sex: Treating RN: [birth year] (78 y.o. [name] Primary Care Provider: Garret Reddish Other Clinician: Referring Provider: Treating Provider/Extender:Stone III, Warner Mccreedy, Alexis Goodell in Treatment: 3 Diagnosis Coding ICD-10 Codes Code Description I87.2 Venous insufficiency (chronic) (peripheral) S81.802A Unspecified open wound, left lower leg, initial encounter L97.822 Non-pressure chronic ulcer of other part of left lower leg with fat layer exposed N18.4 Chronic kidney disease, stage 4 (severe) G90.09 Other idiopathic peripheral autonomic neuropathy Facility Procedures CPT4 Code Description: [id number] 11042 - DEB SUBQ TISSUE 20 SQ CM/< ICD-10 Diagnosis Description L97.822 Non-pressure chronic ulcer of other part of left lower leg Modifier: with fat layer Quantity: 1 exposed Physician  Procedures CPT4 Code Description: 7867544 92010 - WC PHYS LEVEL 4 - EST PT ICD-10 Diagnosis Description I87.2 Venous insufficiency (chronic) (peripheral) S81.802A Unspecified open wound, left lower leg, initial encounte L97.822 Non-pressure chronic ulcer of other  part of left lower l N18.4 Chronic kidney disease, stage 4 (severe) Modifier: 25 r eg with fat laye Quantity: 1 r exposed CPT4 Code Description: [id number] 58832 - WC PHYS SUBQ TISS 20 SQ CM ICD-10 Diagnosis Description L97.822 Non-pressure chronic ulcer of other part of left lower leg Modifier: with fat layer e Quantity: 1 xposed Electronic Signature(s) Signed: 02/22/2019 10:36:32 AM By: Worthy Keeler PA-C Entered By: Worthy Keeler on 02/21/2019  17:08:02

## 2019-02-23 ENCOUNTER — Other Ambulatory Visit (HOSPITAL_COMMUNITY)
Admission: RE | Admit: 2019-02-23 | Discharge: 2019-02-23 | Disposition: A | Discharge status: Home / Self Care | Payer: Medicare Other | Source: Other Acute Inpatient Hospital | Attending: Physician Assistant | Admitting: Physician Assistant

## 2019-02-23 DIAGNOSIS — L97822 Non-pressure chronic ulcer of other part of left lower leg with fat layer exposed: Secondary | ICD-10-CM | POA: Diagnosis not present

## 2019-02-26 NOTE — Progress Notes (Signed)
[name], [name] ([id number]) Visit Report for 02/21/2019 Arrival Information Details Patient Name: Date of Service: [name], [name] 02/21/2019 3:15 PM Medical Record [id number] Patient Account Number: [id number] Date of Birth/Sex: Treating RN: [birth year] (78 y.o. Janyth Contes Primary Care Andrea Colglazier: Garret Reddish Other Clinician: Referring Adyn Serna: Treating Ethelmae Ringel/Extender:Stone III, Warner Mccreedy, Alexis Goodell in Treatment: 3 Visit Information History Since Last Visit Added or deleted any medications: No Patient Arrived: Ambulatory Any new allergies or adverse reactions: No Arrival Time: 16:18 Had a fall or experienced change in No Accompanied By: alone activities of daily living that may affect Transfer Assistance: None risk of falls: Patient Identification Verified: Yes Signs or symptoms of abuse/neglect since last No Secondary Verification Process Yes visito Completed: Hospitalized since last visit: No Patient Requires Transmission-Based No Implantable device outside of the clinic excluding No Precautions: cellular tissue based products placed in the center Patient Has Alerts: No since last visit: Has Dressing in Place as Prescribed: Yes Pain Present Now: No Electronic Signature(s) Signed: 02/26/2019 5:51:44 PM By: [name] RN, BSN Entered By: [name] on 02/21/2019 16:20:20 -------------------------------------------------------------------------------- Compression Therapy Details Patient Name: Date of Service: Shmuel, Girgis Tome 02/21/2019 3:15 PM Medical Record [id number] Patient Account Number: [id number] Date of Birth/Sex: Treating RN: 1940-12-01 (78 y.o. [name] Primary Care Danelia Snodgrass: Garret Reddish Other Clinician: Referring Kayode Petion: Treating Amantha Sklar/Extender:Stone III, Warner Mccreedy, Alexis Goodell in Treatment: 3 Compression Therapy Performed for Wound Wound #1 Left,Posterior Lower Leg Assessment: Performed By:  Clinician Carlene Coria, RN Compression Type: Three Layer Post Procedure Diagnosis Same as Pre-procedure Electronic Signature(s) Signed: 02/21/2019 6:22:02 PM By: Baruch Gouty RN, BSN Entered By: Baruch Gouty on 02/21/2019 16:59:51 -------------------------------------------------------------------------------- Encounter Discharge Information Details Patient Name: Date of Service: [name], [name] 02/21/2019 3:15 PM Medical Record [id number] Patient Account Number: [id number] Date of Birth/Sex: Treating RN: December 28, 1940 (78 y.o. [name] Primary Care Brogen Duell: Garret Reddish Other Clinician: Referring Demeka Sutter: Treating Penelope Fittro/Extender:Stone III, Warner Mccreedy, Alexis Goodell in Treatment: 3 Encounter Discharge Information Items Post Procedure Vitals Discharge Condition: Stable Temperature (F): 97.8 Ambulatory Status: Ambulatory Pulse (bpm): 78 Discharge Destination: Home Respiratory Rate (breaths/min): 18 Transportation: Private Auto Blood Pressure (mmHg): 121/65 Accompanied By: self Schedule Follow-up Appointment: Yes Clinical Summary of Care: Electronic Signature(s) Signed: 02/21/2019 6:36:09 PM By: [name] Entered By: [name] on 02/21/2019 17:16:51 -------------------------------------------------------------------------------- Lower Extremity Assessment Details Patient Name: Date of Service: [name], [name] 02/21/2019 3:15 PM Medical Record [id number] Patient Account Number: [id number] Date of Birth/Sex: Treating RN: [birth year] (78 y.o. Janyth Contes Primary Care Savanah Bayles: Garret Reddish Other Clinician: Referring Trask Vosler: Treating Kaleeya Hancock/Extender:Stone III, Warner Mccreedy, Alexis Goodell in Treatment: 3 Edema Assessment Assessed: [Left: No] [Right: No] Edema: [Left: Ye] [Right: s] Calf Left: Right: Point of Measurement: 31 cm From Medial Instep 41 cm cm Ankle Left: Right: Point of Measurement: 12 cm From Medial Instep 25.6  cm cm Vascular Assessment Pulses: Dorsalis Pedis Palpable: [Left:Yes] Electronic Signature(s) Signed: 02/26/2019 5:51:44 PM By: [name] RN, BSN Entered By: [name] on 02/21/2019 16:22:52 -------------------------------------------------------------------------------- [place] Details Patient Name: Date of Service: [name], [name] 02/21/2019 3:15 PM Medical Record [id number] Patient Account Number: [id number] Date of Birth/Sex: Treating RN: November 20, 1940 (78 y.o. [name] Primary Care Dequann Vandervelden: Garret Reddish Other Clinician: Referring Phoenix Dresser: Treating Toniya Rozar/Extender:Stone III, Warner Mccreedy, Alexis Goodell in Treatment: 3 Active Inactive Venous Leg Ulcer Nursing Diagnoses: Knowledge deficit related to disease process and management Potential for venous Insuffiency (use before diagnosis confirmed) Goals: Patient will maintain optimal edema control Date Initiated: 01/31/2019 Target Resolution  Date: 02/28/2019 Goal Status: Active Interventions: Assess peripheral edema status every visit. Compression as ordered Treatment Activities: Therapeutic compression applied : 01/31/2019 Notes: Wound/Skin Impairment Nursing Diagnoses: Impaired tissue integrity Knowledge deficit related to ulceration/compromised skin integrity Goals: Patient/caregiver will verbalize understanding of skin care regimen Date Initiated: 01/31/2019 Target Resolution Date: 02/28/2019 Goal Status: Active Ulcer/skin breakdown will have a volume reduction of 30% by week 4 Date Initiated: 01/31/2019 Target Resolution Date: 02/28/2019 Goal Status: Active Interventions: Assess patient/caregiver ability to obtain necessary supplies Assess patient/caregiver ability to perform ulcer/skin care regimen upon admission and as needed Assess ulceration(s) every visit Treatment Activities: Skin care regimen initiated : 01/31/2019 Topical wound management initiated :  01/31/2019 Notes: Electronic Signature(s) Signed: 02/21/2019 6:22:02 PM By: Baruch Gouty RN, BSN Entered By: Baruch Gouty on 02/21/2019 16:56:41 -------------------------------------------------------------------------------- Pain Assessment Details Patient Name: Date of Service: [name], [name] 02/21/2019 3:15 PM Medical Record [id number] Patient Account Number: [id number] Date of Birth/Sex: Treating RN: 03-12-1941 (78 y.o. Janyth Contes Primary Care Kellon Chalk: Garret Reddish Other Clinician: Referring Caylyn Tedeschi: Treating Abimelec Grochowski/Extender:Stone III, Warner Mccreedy, Alexis Goodell in Treatment: 3 Active Problems Location of Pain Severity and Description of Pain Patient Has Paino No Site Locations Pain Management and Medication Current Pain Management: Electronic Signature(s) Signed: 02/26/2019 5:51:44 PM By: [name] RN, BSN Entered By: [name] on 02/21/2019 16:20:49 -------------------------------------------------------------------------------- Patient/Caregiver Education Details Patient Name: [name], [name] 12/2/2020andnbsp3:15 Date of Service: PM Medical Record [id number] Number: Patient Account Number: [id number] Treating RN: Date of Birth/Gender: 25-Apr-1940 (78 y.o. [name]) Other Clinician: Primary Care Physician: Garret Reddish Treating Worthy Keeler Referring Physician: Physician/Extender: Kaylyn Layer in Treatment: 3 Education Assessment Education Provided To: Patient Education Topics Provided Infection: Methods: Explain/Verbal Responses: Reinforcements needed, State content correctly Venous: Methods: Explain/Verbal Responses: Reinforcements needed, State content correctly Wound Debridement: Methods: Explain/Verbal Responses: Reinforcements needed, State content correctly Electronic Signature(s) Signed: 02/21/2019 6:22:02 PM By: Baruch Gouty RN, BSN Entered By: Baruch Gouty on 02/21/2019  16:59:33 -------------------------------------------------------------------------------- Wound Assessment Details Patient Name: Date of Service: [name], [name] 02/21/2019 3:15 PM Medical Record [id number] Patient Account Number: [id number] Date of Birth/Sex: Treating RN: 10/09/1940 (78 y.o. Janyth Contes Primary Care Wassim Kirksey: Garret Reddish Other Clinician: Referring Lue [place]: Treating Keller Mikels/Extender:Stone III, Warner Mccreedy, Alexis Goodell in Treatment: 3 Wound Status Wound Number: 1 Primary Venous Leg Ulcer Etiology: Wound Location: Left Lower Leg - Posterior Wound Open Wounding Event: Trauma Status: Date Acquired: 11/29/2018 Comorbid Cataracts, Sleep Apnea, Congestive Heart Weeks Of Treatment: 3 History: Failure, Peripheral Venous Disease, Clustered Wound: No Osteoarthritis, Neuropathy Photos Wound Measurements Length: (cm) 3.5 % Reduct Width: (cm) 2 % Reduct Depth: (cm) 0.1 Epitheli Area: (cm) 5.498 Tunneli Volume: (cm) 0.55 Undermi Wound Description Classification: Full Thickness Without Exposed Support Foul Od Structures Slough/ Wound Flat and Intact Margin: Exudate Medium Amount: Exudate Serosanguineous Type: Exudate red, brown Color: Wound Bed Granulation Amount: Medium (34-66%) Granulation Quality: Pink Fascia Necrotic Amount: Medium (34-66%) Fat La Necrotic Quality: Adherent Slough Tendon Muscle Joint Bone E or After Cleansing: No Fibrino Yes Exposed Structure Exposed: No yer (Subcutaneous Tissue) Exposed: Yes Exposed: No Exposed: No Exposed: No xposed: No ion in Area: 55.3% ion in Volume: 77.6% alization: Small (1-33%) ng: No ning: No Treatment Notes Wound #1 (Left, Posterior Lower Leg) 1. Cleanse With Wound Cleanser Soap and water 2. Periwound Care Moisturizing lotion 3. Primary Dressing Applied Hydrogel or K-Y Jelly Santyl 4. Secondary Dressing ABD Pad Dry Gauze 6. Support Layer Applied 3 layer compression  wrap Notes netting. explained not to  take iron and calcium with abx. patient in agreement. Electronic Signature(s) Signed: 02/23/2019 3:46:47 PM By: Mikeal Hawthorne EMT/HBOT Signed: 02/26/2019 5:51:44 PM By: [name] RN, BSN Entered By: Mikeal Hawthorne on 02/23/2019 14:25:56 -------------------------------------------------------------------------------- Vitals Details Patient Name: Date of Service: [name], [name] 02/21/2019 3:15 PM Medical Record [id number] Patient Account Number: [id number] Date of Birth/Sex: Treating RN: Jul 21, 1940 (78 y.o. Janyth Contes Primary Care Safiyyah Vasconez: Garret Reddish Other Clinician: Referring Riven Beebe: Treating Red Mandt/Extender:Stone III, Warner Mccreedy, Alexis Goodell in Treatment: 3 Vital Signs Time Taken: 16:20 Temperature (F): 97.8 Height (in): 74 Pulse (bpm): 78 Weight (lbs): 180 Respiratory Rate (breaths/min): 18 Body Mass Index (BMI): 23.1 Blood Pressure (mmHg): 121/65 Reference Range: 80 - 120 mg / dl Electronic Signature(s) Signed: 02/26/2019 5:51:44 PM By: [name] RN, BSN Entered By: [name] on 02/21/2019 16:20:38

## 2019-02-27 DIAGNOSIS — H2511 Age-related nuclear cataract, right eye: Secondary | ICD-10-CM | POA: Diagnosis not present

## 2019-02-27 LAB — AEROBIC CULTURE W GRAM STAIN (SUPERFICIAL SPECIMEN): Culture: NO GROWTH

## 2019-02-27 NOTE — Progress Notes (Signed)
Michael, Fluegel Rivera ([id number]) Visit Report for 02/07/2019 Arrival Information Details Patient Name: Date of Service: [name], [name] 02/07/2019 11:00 AM Medical Record [id number] Patient Account Number: [id number] Date of Birth/Sex: 1941-03-10 (78 y.o. M) Treating RN: Carlene Coria Primary Care Zorana Brockwell: Garret Reddish Other Clinician: Referring Eulice Rutledge: Treating Allante Beane/Extender:Stone III, Warner Mccreedy, Alexis Goodell in Treatment: 1 Visit Information History Since Last Visit All ordered tests and consults were completed: No Patient Arrived: Ambulatory Added or deleted any medications: No Arrival Time: 12:08 Any new allergies or adverse reactions: No Accompanied By: self Had a fall or experienced change in No Transfer Assistance: None activities of daily living that may affect Patient Identification Verified: Yes risk of falls: Secondary Verification Process Yes Signs or symptoms of abuse/neglect since last No Completed: visito Patient Requires Transmission-Based No Hospitalized since last visit: No Precautions: Implantable device outside of the clinic excluding No Patient Has Alerts: No cellular tissue based products placed in the center since last visit: Has Dressing in Place as Prescribed: Yes Has Compression in Place as Prescribed: Yes Pain Present Now: No Electronic Signature(s) Signed: 02/27/2019 2:50:47 PM By: Carlene Coria RN Entered By: Carlene Coria on 02/07/2019 12:08:34 -------------------------------------------------------------------------------- Compression Therapy Details Patient Name: Date of Service: Michael, Moster Rivera 02/07/2019 11:00 AM Medical Record [id number] Patient Account Number: [id number] Date of Birth/Sex: Jan 18, 1941 (78 y.o. M) Treating RN: Baruch Gouty Primary Care Tamaria Dunleavy: Garret Reddish Other Clinician: Referring Araiya Tilmon: Treating Bree Heinzelman/Extender:Stone III, Warner Mccreedy, Alexis Goodell in Treatment: 1 Compression  Therapy Performed for Wound Wound #1 Left,Posterior Lower Leg Assessment: Performed By: Clinician [name], RN Compression Type: Three Layer Post Procedure Diagnosis Same as Pre-procedure Electronic Signature(s) Signed: 02/07/2019 5:42:21 PM By: Baruch Gouty RN, BSN Entered By: Baruch Gouty on 02/07/2019 12:49:03 -------------------------------------------------------------------------------- Encounter Discharge Information Details Patient Name: Date of Service: Michael, Greenley Rivera 02/07/2019 11:00 AM Medical Record [id number] Patient Account Number: [id number] Date of Birth/Sex: [birth year] (78 y.o. M) Treating RN: [name] Primary Care Winefred Hillesheim: Garret Reddish Other Clinician: Referring Taquana Bartley: Treating Declin Rajan/Extender:Stone III, Warner Mccreedy, Alexis Goodell in Treatment: 1 Encounter Discharge Information Items Post Procedure Vitals Discharge Condition: Stable Temperature (F): 98 Ambulatory Status: Ambulatory Pulse (bpm): 93 Discharge Destination: Home Respiratory Rate (breaths/min): 16 Transportation: Private Auto Blood Pressure (mmHg): 118/71 Accompanied By: self Schedule Follow-up Appointment: Yes Clinical Summary of Care: Electronic Signature(s) Signed: 02/07/2019 5:18:27 PM By: [name] Entered By: [name] on 02/07/2019 13:07:35 -------------------------------------------------------------------------------- Lower Extremity Assessment Details Patient Name: Date of Service: Michael, Rivera 02/07/2019 11:00 AM Medical Record [id number] Patient Account Number: [id number] Date of Birth/Sex: 1941/01/31 (78 y.o. M) Treating RN: Carlene Coria Primary Care Alena Blankenbeckler: Garret Reddish Other Clinician: Referring Nakai Pollio: Treating Amali Uhls/Extender:Stone III, Warner Mccreedy, Alexis Goodell in Treatment: 1 Edema Assessment Assessed: [Left: No] [Right: No] Edema: [Left: Ye] [Right: s] Calf Left: Right: Point of Measurement: 31 cm From Medial  Instep 37 cm cm Ankle Left: Right: Point of Measurement: 12 cm From Medial Instep 24 cm cm Electronic Signature(s) Signed: 02/27/2019 2:50:47 PM By: Carlene Coria RN Entered By: Carlene Coria on 02/07/2019 12:20:29 -------------------------------------------------------------------------------- [place] Details Patient Name: Date of Service: Michael, Coudriet Rivera 02/07/2019 11:00 AM Medical Record [id number] Patient Account Number: [id number] Date of Birth/Sex: 07-01-1940 (78 y.o. M) Treating RN: Baruch Gouty Primary Care Keyuna Cuthrell: Garret Reddish Other Clinician: Referring Denaja Verhoeven: Treating Zaiah Eckerson/Extender:Stone III, Warner Mccreedy, Alexis Goodell in Treatment: 1 Active Inactive Venous Leg Ulcer Nursing Diagnoses: Knowledge deficit related to disease process and management Potential for venous Insuffiency (use before diagnosis confirmed) Goals: Patient will maintain optimal edema  control Date Initiated: 01/31/2019 Target Resolution Date: 02/28/2019 Goal Status: Active Interventions: Assess peripheral edema status every visit. Compression as ordered Treatment Activities: Therapeutic compression applied : 01/31/2019 Notes: Wound/Skin Impairment Nursing Diagnoses: Impaired tissue integrity Knowledge deficit related to ulceration/compromised skin integrity Goals: Patient/caregiver will verbalize understanding of skin care regimen Date Initiated: 01/31/2019 Target Resolution Date: 02/28/2019 Goal Status: Active Ulcer/skin breakdown will have a volume reduction of 30% by week 4 Date Initiated: 01/31/2019 Target Resolution Date: 02/28/2019 Goal Status: Active Interventions: Assess patient/caregiver ability to obtain necessary supplies Assess patient/caregiver ability to perform ulcer/skin care regimen upon admission and as needed Assess ulceration(s) every visit Treatment Activities: Skin care regimen initiated : 01/31/2019 Topical wound management  initiated : 01/31/2019 Notes: Electronic Signature(s) Signed: 02/07/2019 5:42:21 PM By: Baruch Gouty RN, BSN Entered By: Baruch Gouty on 02/07/2019 12:37:33 -------------------------------------------------------------------------------- Pain Assessment Details Patient Name: Date of Service: Edson, Deridder Burton 02/07/2019 11:00 AM Medical Record [id number] Patient Account Number: [id number] Date of Birth/Sex: Jan 19, 1941 (78 y.o. M) Treating RN: Carlene Coria Primary Care Sharryn Belding: Garret Reddish Other Clinician: Referring Merrie Epler: Treating Lenaya Pietsch/Extender:Stone III, Warner Mccreedy, Alexis Goodell in Treatment: 1 Active Problems Location of Pain Severity and Description of Pain Patient Has Paino No Site Locations Pain Management and Medication Current Pain Management: Electronic Signature(s) Signed: 02/27/2019 2:50:47 PM By: Carlene Coria RN Entered By: Carlene Coria on 02/07/2019 12:09:43 -------------------------------------------------------------------------------- Patient/Caregiver Education Details Patient Name: [name], Daden 11/18/2020andnbsp11:00 Date of Service: AM Medical Record [id number] Number: Patient Account Number: [id number] Treating RN: Date of Birth/Gender: [birth year] (77 y.o. [name]) Other Clinician: Primary Care Treating Hunter, Rosilyn Mings Physician: Physician/Extender: Referring Physician: Kaylyn Layer in Treatment: 1 Education Assessment Education Provided To: Patient Education Topics Provided Wound/Skin Impairment: Methods: Explain/Verbal Responses: Reinforcements needed, State content correctly Electronic Signature(s) Signed: 02/07/2019 5:42:21 PM By: Baruch Gouty RN, BSN Entered By: Baruch Gouty on 02/07/2019 12:37:56 -------------------------------------------------------------------------------- Wound Assessment Details Patient Name: Date of Service: Keenen, Roessner Dat 02/07/2019 11:00  AM Medical Record [id number] Patient Account Number: [id number] Date of Birth/Sex: [birth year] (78 y.o. M) Treating RN: Carlene Coria Primary Care Sander Speckman: Garret Reddish Other Clinician: Referring Collyn Ribas: Treating Suly Vukelich/Extender:Stone III, Warner Mccreedy, Alexis Goodell in Treatment: 1 Wound Status Wound Number: 1 Primary Venous Leg Ulcer Etiology: Wound Location: Left Lower Leg - Posterior Wound Open Wounding Event: Trauma Status: Date Acquired: 11/29/2018 Date Acquired: 11/29/2018 Comorbid Cataracts, Sleep Apnea, Congestive Heart Weeks Of Treatment: 1 History: Failure, Peripheral Venous Disease, Clustered Wound: No Osteoarthritis, Neuropathy Photos Wound Measurements Length: (cm) 6 % Reduct Width: (cm) 2.8 % Reduct Depth: (cm) 0.2 Epitheli Area: (cm) 13.195 Tunneli Volume: (cm) 2.639 Wound Description Full Thickness Without Exposed Support Classification: Structures Wound Flat and Intact Margin: Exudate Medium Amount: Exudate Serosanguineous Type: Exudate red, brown Color: Wound Bed Granulation Amount: Small (1-33%) Granulation Quality: Pink Necrotic Amount: Large (67-100%) Necrotic Quality: Adherent Slough Foul Odor After Cleansing: No Slough/Fibrino Yes Exposed Structure Fascia Exposed: No Fat Layer (Subcutaneous Tissue) Exposed: Yes Tendon Exposed: No Muscle Exposed: No Joint Exposed: No Bone Exposed: No ion in Area: -7.3% ion in Volume: -7.3% alization: None ng: No Electronic Signature(s) Signed: 02/13/2019 7:37:04 AM By: Mikeal Hawthorne EMT/HBOT Signed: 02/27/2019 2:50:47 PM By: Carlene Coria RN Entered By: Mikeal Hawthorne on 02/12/2019 08:10:34 -------------------------------------------------------------------------------- Vitals Details Patient Name: Date of Service: [name], Kysean 02/07/2019 11:00 AM Medical Record [id number] Patient Account Number: [id number] Date of Birth/Sex: [birth year] (78 y.o. M) Treating RN: Carlene Coria Primary Care Tara Wich: Other Clinician: Garret Reddish Referring Purvis Sidle: Treating Naia Ruff/Extender:Stone III,  Warner Mccreedy, Stephen Weeks in Treatment: 1 Vital Signs Time Taken: 12:08 Temperature (F): 98 Height (in): 74 Pulse (bpm): 93 Weight (lbs): 180 Respiratory Rate (breaths/min): 16 Body Mass Index (BMI): 23.1 Blood Pressure (mmHg): 118/71 Reference Range: 80 - 120 mg / dl Electronic Signature(s) Signed: 02/27/2019 2:50:47 PM By: Carlene Coria RN Entered By: Carlene Coria on 02/07/2019 12:09:36

## 2019-02-28 ENCOUNTER — Encounter (HOSPITAL_BASED_OUTPATIENT_CLINIC_OR_DEPARTMENT_OTHER): Payer: Medicare Other | Admitting: Physician Assistant

## 2019-02-28 ENCOUNTER — Other Ambulatory Visit: Payer: Self-pay

## 2019-02-28 DIAGNOSIS — L97822 Non-pressure chronic ulcer of other part of left lower leg with fat layer exposed: Secondary | ICD-10-CM | POA: Diagnosis not present

## 2019-02-28 DIAGNOSIS — Z96651 Presence of right artificial knee joint: Secondary | ICD-10-CM | POA: Diagnosis not present

## 2019-02-28 DIAGNOSIS — N184 Chronic kidney disease, stage 4 (severe): Secondary | ICD-10-CM | POA: Diagnosis not present

## 2019-02-28 DIAGNOSIS — I872 Venous insufficiency (chronic) (peripheral): Secondary | ICD-10-CM | POA: Diagnosis not present

## 2019-02-28 DIAGNOSIS — Z96611 Presence of right artificial shoulder joint: Secondary | ICD-10-CM | POA: Diagnosis not present

## 2019-02-28 DIAGNOSIS — G9009 Other idiopathic peripheral autonomic neuropathy: Secondary | ICD-10-CM | POA: Diagnosis not present

## 2019-02-28 DIAGNOSIS — G629 Polyneuropathy, unspecified: Secondary | ICD-10-CM | POA: Diagnosis not present

## 2019-02-28 DIAGNOSIS — G4730 Sleep apnea, unspecified: Secondary | ICD-10-CM | POA: Diagnosis not present

## 2019-02-28 DIAGNOSIS — I509 Heart failure, unspecified: Secondary | ICD-10-CM | POA: Diagnosis not present

## 2019-02-28 DIAGNOSIS — L97222 Non-pressure chronic ulcer of left calf with fat layer exposed: Secondary | ICD-10-CM | POA: Diagnosis not present

## 2019-02-28 NOTE — Progress Notes (Addendum)
[name], [name] ([id number]) Visit Report for 02/28/2019 Allergy List Details Patient Name: Date of Service: [name], [name] 02/28/2019 10:30 AM Medical Record [id number] Patient Account Number: [id number] Date of Birth/Sex: Treating RN: July 01, 1940 (78 y.o. [name] Primary Care [name]: [name] Other Clinician: Referring [name]: Treating [name]/Extender:[name], [name], [name] in Treatment: 4 Allergies Active Allergies No Known Drug Allergies Allergy Notes Electronic Signature(s) Signed: 02/28/2019 12:32:42 PM By: Worthy Keeler PA-C Entered By: Worthy Keeler on 02/28/2019 10:45:39 -------------------------------------------------------------------------------- Arrival Information Details Patient Name: Date of Service: [name], [name] 02/28/2019 10:30 AM Medical Record [id number] Patient Account Number: [id number] Date of Birth/Sex: Treating RN: Feb 08, 1941 (78 y.o. [name] Primary Care [name]: [name] Other Clinician: Referring Wilhelmenia Addis: Treating [name]/Extender:[name], [name], [name] in Treatment: 4 Visit Information Patient Arrived: Ambulatory Arrival Time: 10:35 Accompanied By: self Transfer Assistance: None Patient Identification Verified: Yes Secondary Verification Process Yes Completed: Patient Requires Transmission-Based No Precautions: Patient Has Alerts: No Pa History Since Last Visit Added or deleted any medications: No Any new allergies or adverse reactions: No Had a fall or experienced change in activities of daily living that may affect risk of falls: No Signs or symptoms of abuse/neglect since last visito No Hospitalized since last visit: No Implantable device outside of the clinic excluding No cellular tissue based products placed in the center since last visit: Has Dressing in Place as Prescribed: Yes Has Compression in Place as Prescribed: Yes Electronic  Signature(s) Signed: 02/28/2019 12:04:56 PM By: [name] Entered By: [name] on 02/28/2019 10:35:36 -------------------------------------------------------------------------------- Compression Therapy Details Patient Name: Date of Service: [name], [name] 02/28/2019 10:30 AM Medical Record [id number] Patient Account Number: [id number] Date of Birth/Sex: Treating RN: 1940/08/02 (78 y.o. [name] Primary Care [name]: [name] Other Clinician: Referring [name]: Treating [name]/Extender:[name], [name], [name] in Treatment: 4 Compression Therapy Performed for Wound Wound #1 Left,Posterior Lower Leg Assessment: Performed By: Clinician Michael Hurst, RN Compression Type: Three Layer Post Procedure Diagnosis Same as Pre-procedure Electronic Signature(s) Signed: 02/28/2019 4:59:21 PM By: Michael Hurst RN, BSN Entered By: [name] on 02/28/2019 10:52:31 -------------------------------------------------------------------------------- Lower Extremity Assessment Details Patient Name: Date of Service: [name], [name] 02/28/2019 10:30 AM Medical Record [id number] Patient Account Number: [id number] Date of Birth/Sex: Treating RN: Sep 07, 1940 (78 y.o. [name] Primary Care [name]: [name] Other Clinician: Referring [name]: Treating [name]/Extender:[name], [name], [name] in Treatment: 4 Edema Assessment Assessed: [Left: No] [Right: No] Edema: [Left: Ye] [Right: s] Calf Left: Right: Point of Measurement: 31 cm From Medial Instep 41 cm cm Ankle Left: Right: Point of Measurement: 12 cm From Medial Instep 25.6 cm cm Vascular Assessment Pulses: Dorsalis Pedis Palpable: [Left:Yes] Electronic Signature(s) Signed: 02/28/2019 12:04:56 PM By: [name] Entered By: [name] on 02/28/2019  10:37:35 -------------------------------------------------------------------------------- Pain Assessment Details Patient Name: Date of Service: [name], [name] 02/28/2019 10:30 AM Medical Record [id number] Patient Account Number: [id number] Date of Birth/Sex: Treating RN: 1940/07/05 (78 y.o. [name] Primary Care [name]: [name] Other Clinician: Referring [name]: Treating [name]/Extender:[name], [name], [name] in Treatment: 4 Active Problems Location of Pain Severity and Description of Pain Patient Has Paino No Site Locations Pain Management and Medication Current Pain Management: Electronic Signature(s) Signed: 02/28/2019 12:04:56 PM By: [name] Entered By: [name] on 02/28/2019 10:37:24 -------------------------------------------------------------------------------- Patient/Caregiver Education Details Patient Name: [name], [name] 12/9/2020andnbsp10:30 Date of Service: AM Medical Record [id number] Number: Patient Account Number: [id number] Treating RN: [birth year] (78 y.o. [name] Date of  Birth/Gender: M) Other Clinician: Primary Care Treating [name], [name] Physician: Physician/Extender: Referring Physician: Kaylyn Layer in Treatment: 4 Education Assessment Education Provided To: Patient Education Topics Provided Infection: Handouts: Infection Prevention and Management Methods: Explain/Verbal Responses: Reinforcements needed Wound/Skin Impairment: Handouts: Skin Care Do's and Dont's Methods: Explain/Verbal Responses: Reinforcements needed Electronic Signature(s) Signed: 02/28/2019 12:05:21 PM By: [name] Entered By: [name] on 02/28/2019 11:03:46 -------------------------------------------------------------------------------- Wound Assessment Details Patient Name: Date of Service: [name], [name] 02/28/2019 10:30 AM Medical Record [id number]  Patient Account Number: [id number] Date of Birth/Sex: Treating RN: December 20, 1940 (78 y.o. [name] Primary Care Shavonn Convey: [name] Other Clinician: Referring [name]: Treating [name]/Extender:[name], [name], [name] in Treatment: 4 Wound Status Wound Number: 1 Primary Venous Leg Ulcer Etiology: Wound Location: Left Lower Leg - Posterior Wound Open Wounding Event: Trauma Status: Date Acquired: 11/29/2018 ComorbidCataracts, Sleep Apnea, Congestive Heart Weeks Of Treatment: 4 Weeks Of Treatment: 4 History: Failure, Peripheral Venous Disease, Clustered Wound: No Osteoarthritis, Neuropathy Photos Wound Measurements Length: (cm) 3.9 % Reduct Width: (cm) 2.3 % Reduct Depth: (cm) 0.2 Epitheli Area: (cm) 7.045 Tunneli Volume: (cm) 1.409 Undermi Wound Description Full Thickness Without Exposed Support Foul Od Classification: Structures Slough/ Wound Flat and Intact Margin: Exudate Medium Amount: Exudate Serosanguineous Type: Exudate red, brown Color: Wound Bed Granulation Amount: Medium (34-66%) Granulation Quality: Pink Fascia E Necrotic Amount: Medium (34-66%) Fat Laye Necrotic Quality: Adherent Slough Tendon E Muscle E Joint Ex Bone Exp or After Cleansing: No Fibrino Yes Exposed Structure xposed: No r (Subcutaneous Tissue) Exposed: Yes xposed: No xposed: No posed: No osed: No ion in Area: 42.7% ion in Volume: 42.7% alization: Small (1-33%) ng: No ning: No Electronic Signature(s) Signed: 03/01/2019 3:39:48 PM By: Mikeal Hawthorne EMT/HBOT Signed: 03/05/2019 5:56:42 PM By: [name] Previous Signature: 02/28/2019 12:04:56 PM Version By: [name] Entered By: Mikeal Hawthorne on 03/01/2019 13:54:55 -------------------------------------------------------------------------------- Vitals Details Patient Name: Date of Service: [name], [name] 02/28/2019 10:30 AM Medical Record [id number] Patient Account  Number: [id number] Date of Birth/Sex: Treating RN: April 03, 1940 (78 y.o. [name] Primary Care Livy Ross: Other Clinician: Garret Rivera Referring Kyoko Elsea: Treating Michele Kerlin/Extender:[name], [name], [name] in Treatment: 4 Vital Signs Time Taken: 10:35 Temperature (F): 98.8 Height (in): 74 Pulse (bpm): 101 Weight (lbs): 180 Respiratory Rate (breaths/min): 18 Body Mass Index (BMI): 23.1 Blood Pressure (mmHg): 132/74 Reference Range: 80 - 120 mg / dl Electronic Signature(s) Signed: 02/28/2019 12:04:56 PM By: [name] Entered By: [name] on 02/28/2019 10:39:54

## 2019-02-28 NOTE — Progress Notes (Addendum)
Brylee, Mcgreal Shante ([id number]) Visit Report for 02/28/2019 Debridement Details Patient Name: Date of Service: JAIMEN, MELONE 02/28/2019 10:30 AM Medical Record [id number] Patient Account Number: [id number] Date of Birth/Sex: 06-Jul-1940 (78 y.o. M) Treating RN: [name] Primary Care Provider: Garret Reddish Other Clinician: Referring Provider: Treating Provider/Extender:Stone III, Warner Mccreedy, Alexis Goodell in Treatment: 4 Debridement Performed for Wound #1 Left,Posterior Lower Leg Assessment: Performed By: Clinician [name], RN Debridement Type: Chemical/Enzymatic/Mechanical Agent Used: Santyl Severity of Tissue Pre Fat layer exposed Debridement: Level of Consciousness (Pre- Awake and Alert procedure): Pre-procedure Verification/Time Out Taken: No Start Time: 10:52 Bleeding: None End Time: 10:52 Procedural Pain: 0 Post Procedural Pain: 0 Response to Treatment: Procedure was tolerated well Level of Consciousness Awake and Alert (Post-procedure): Post Debridement Measurements of Total Wound Length: (cm) 3.9 Width: (cm) 2.3 Depth: (cm) 0.2 Volume: (cm) 1.409 Character of Wound/Ulcer Post Improved Debridement: Severity of Tissue Post Debridement: Fat layer exposed Post Procedure Diagnosis Same as Pre-procedure Electronic Signature(s) Signed: 02/28/2019 12:32:42 PM By: Worthy Keeler PA-C Signed: 02/28/2019 4:59:21 PM By: [name] RN, BSN Entered By: [name] on 02/28/2019 10:52:21 -------------------------------------------------------------------------------- HPI Details Patient Name: Date of Service: Heal, Benji 02/28/2019 10:30 AM Medical Record [id number] Patient Account Number: [id number] Date of Birth/Sex: Treating RN: 16-Feb-1941 (78 y.o. Janyth Contes Primary Care Provider: Garret Reddish Other Clinician: Referring Provider: Treating Provider/Extender:Stone III, Warner Mccreedy, Alexis Goodell in Treatment: 4 History  of Present Illness HPI Description: 01/31/2019 on evaluation today patient actually appears to be doing poorly unfortunately secondary to an injury that occurred when he hit his leg on a car door September 9. He was seen in the ER where he had 13 sutures placed. His primary care provider remove those 2 weeks later and unfortunately the wound began to dehisce following. It appears that he may have had a skin flap that just did not take and survive. Nonetheless he was stated to have had Keflex given from the ER though the patient states he does not even remember that. He also is stated to have taken 2 rounds of doxycycline by his primary care provider but again the patient states he only remembers the most recent antibiotic which was actually the Augmentin then switched to Bactrim based on the culture results. The culture showed that he had Morganella Morgagni noted in the culture. Subsequently when he was taking the Bactrim he states that the redness got much better but then unfortunately worsened again once he came off of the medication. Apparently the patient was given triamcinolone ointment to put on the wound although obviously that really did not do much for the wound and again the patient tells me that he was "given the wrong ointment". Again I am not exactly sure what was going on in that scenario 1 where another. Either way I do believe that he has necrotic tissue on the surface of the wound this can require some debridement if he is able to tolerate it and then subsequently he is also get a need likely a refill of the Bactrim in order to help treat the infection I do not believe this is completely cleared up at this point. I also think that he really needs to have edema control again he is very swollen and I believe that Performing a compression wrap will also be of great benefit for him. The patient does have chronic kidney disease stage IV along with peripheral neuropathy secondary to  issues with his back. 02/07/2019 on evaluation today patient appears to  be doing a little better with regard to the overall appearance of the wound although it appears to be very dry. Fortunately there is no signs of active infection at this time. No fever chills noted. I really feel like that the patient may be having issues at this point with the Iodoflex being too absorptive for him at this point which is causing this to dry out. I think we may want to try something different to see if that would be of benefit for him. 02/14/2019 on evaluation today patient actually appears to be doing much better with regard to his wound at this time. He has been tolerating the dressing changes without complication. Fortunately everything appears to be doing much better from the standpoint of how soft the dead tissue is today in fact I was able to debride away some of the necrotic tissue because it was doing so well. Fortunately he is showing signs of good improvement as of this week. 02/21/2019 on evaluation today patient feels like he is actually doing a little bit worse noticing the redness on his lower extremity. I agree I have not seen this before and I am concerned about the possibility of infection. I think we may want to initiate something such as doxycycline and possibly wound culture today to see what exactly may be going on here. 02/28/19 evaluation today patient appears to be doing better with regard to his wound. Hes been tolerating the dressing changes without complication. Fortunately theres no signs of active infection at this time. Overall I feel like the doxycycline has also been helpful for him. Electronic Signature(s) Signed: 03/05/2019 10:24:00 AM By: Worthy Keeler PA-C Signed: 03/05/2019 6:02:21 PM By: [name] RN, BSN Previous Signature: 02/28/2019 12:32:42 PM Version By: Worthy Keeler PA-C Entered By: [name] on 03/02/2019  80:99:83 -------------------------------------------------------------------------------- Physical Exam Details Patient Name: Date of Service: Jeremaih, Klima Mozell 02/28/2019 10:30 AM Medical Record [id number] Patient Account Number: [id number] Date of Birth/Sex: Treating RN: [birth year] (79 y.o. Janyth Contes Primary Care Provider: Garret Reddish Other Clinician: Referring Provider: Treating Provider/Extender:Stone III, Warner Mccreedy, Alexis Goodell in Treatment: 4 Constitutional Well-nourished and well-hydrated in no acute distress. Respiratory normal breathing without difficulty. Psychiatric this patient is able to make decisions and demonstrates good insight into disease process. Alert and Oriented x 3. pleasant and cooperative. Notes The patients wound bed actually showed signs of good granulation at this point and much less slough noted. He has been tolerating the dressing changes without any complication and overall Im very pleased with how things stand today. The erythema in regard to the leg is also greatly improved compared to last week. Electronic Signature(s) Signed: 02/28/2019 12:32:42 PM By: Worthy Keeler PA-C Entered By: Worthy Keeler on 02/28/2019 12:27:06 -------------------------------------------------------------------------------- Physician Orders Details Patient Name: Date of Service: Cordel, Drewes Camillo 02/28/2019 10:30 AM Medical Record [id number] Patient Account Number: [id number] Date of Birth/Sex: Treating RN: Feb 07, 1941 (78 y.o. Janyth Contes Primary Care Provider: Garret Reddish Other Clinician: Referring Provider: Treating Provider/Extender:Stone III, Warner Mccreedy, Alexis Goodell in Treatment: 4 Verbal / Phone Orders: No Diagnosis Coding ICD-10 Coding Code Description I87.2 Venous insufficiency (chronic) (peripheral) S81.802A Unspecified open wound, left lower leg, initial encounter L97.822 Non-pressure chronic ulcer of other part  of left lower leg with fat layer exposed N18.4 Chronic kidney disease, stage 4 (severe) G90.09 Other idiopathic peripheral autonomic neuropathy Follow-up Appointments Return Appointment in 1 week. Dressing Change Frequency Wound #1 Left,Posterior Lower Leg Do not change entire dressing for one  week. Skin Barriers/Peri-Wound Care Wound #1 Left,Posterior Lower Leg Moisturizing lotion - to leg Wound Cleansing Wound #1 Left,Posterior Lower Leg May shower with protection. Primary Wound Dressing Wound #1 Left,Posterior Lower Leg Santyl Ointment Hydrogel - thin layer over santyl Secondary Dressing Wound #1 Left,Posterior Lower Leg Dry Gauze ABD pad Edema Control 3 Layer Compression System - Left Lower Extremity Avoid standing for long periods of time Elevate legs to the level of the heart or above for 30 minutes daily and/or when sitting, a frequency of: - throughout the week Exercise regularly Electronic Signature(s) Signed: 02/28/2019 12:32:42 PM By: Worthy Keeler PA-C Signed: 02/28/2019 4:59:21 PM By: [name] RN, BSN Entered By: [name] on 02/28/2019 10:53:07 -------------------------------------------------------------------------------- Problem List Details Patient Name: Date of Service: [place], Ndiaye Damarkus 02/28/2019 10:30 AM Medical Record [id number] Patient Account Number: [id number] Date of Birth/Sex: Treating RN: [birth year] (77 y.o. Janyth Contes Primary Care Provider: Garret Reddish Other Clinician: Referring Provider: Treating Provider/Extender:Stone III, Warner Mccreedy, Alexis Goodell in Treatment: 4 Active Problems ICD-10 Evaluated Encounter Code Description Active Date Code Description Active Date Today Diagnosis I87.2 Venous insufficiency (chronic) (peripheral) 01/31/2019 No Yes S81.802A Unspecified open wound, left lower leg, initial 01/31/2019 No Yes encounter L97.822 Non-pressure chronic ulcer of other part of left lower 01/31/2019 No  Yes leg with fat layer exposed N18.4 Chronic kidney disease, stage 4 (severe) 01/31/2019 No Yes G90.09 Other idiopathic peripheral autonomic neuropathy 01/31/2019 No Yes Inactive Problems Resolved Problems Electronic Signature(s) Signed: 02/28/2019 12:32:42 PM By: Worthy Keeler PA-C Entered By: Worthy Keeler on 02/28/2019 10:46:08 -------------------------------------------------------------------------------- Progress Note Details Patient Name: Date of Service: Mauri, Tolen Dalin 02/28/2019 10:30 AM Medical Record [id number] Patient Account Number: [id number] Date of Birth/Sex: Treating RN: 1940/06/29 (78 y.o. Janyth Contes Primary Care Provider: Garret Reddish Other Clinician: Referring Provider: Treating Provider/Extender:Stone III, Warner Mccreedy, Alexis Goodell in Treatment: 4 Subjective History of Present Illness (HPI) 01/31/2019 on evaluation today patient actually appears to be doing poorly unfortunately secondary to an injury that occurred when he hit his leg on a car door September 9. He was seen in the ER where he had 13 sutures placed. His primary care provider remove those 2 weeks later and unfortunately the wound began to dehisce following. It appears that he may have had a skin flap that just did not take and survive. Nonetheless he was stated to have had Keflex given from the ER though the patient states he does not even remember that. He also is stated to have taken 2 rounds of doxycycline by his primary care provider but again the patient states he only remembers the most recent antibiotic which was actually the Augmentin then switched to Bactrim based on the culture results. The culture showed that he had Morganella Morgagni noted in the culture. Subsequently when he was taking the Bactrim he states that the redness got much better but then unfortunately worsened again once he came off of the medication. Apparently the patient was given triamcinolone ointment  to put on the wound although obviously that really did not do much for the wound and again the patient tells me that he was "given the wrong ointment". Again I am not exactly sure what was going on in that scenario 1 where another. Either way I do believe that he has necrotic tissue on the surface of the wound this can require some debridement if he is able to tolerate it and then subsequently he is also get a need likely a refill of the Bactrim  in order to help treat the infection I do not believe this is completely cleared up at this point. I also think that he really needs to have edema control again he is very swollen and I believe that Performing a compression wrap will also be of great benefit for him. The patient does have chronic kidney disease stage IV along with peripheral neuropathy secondary to issues with his back. 02/07/2019 on evaluation today patient appears to be doing a little better with regard to the overall appearance of the wound although it appears to be very dry. Fortunately there is no signs of active infection at this time. No fever chills noted. I really feel like that the patient may be having issues at this point with the Iodoflex being too absorptive for him at this point which is causing this to dry out. I think we may want to try something different to see if that would be of benefit for him. 02/14/2019 on evaluation today patient actually appears to be doing much better with regard to his wound at this time. He has been tolerating the dressing changes without complication. Fortunately everything appears to be doing much better from the standpoint of how soft the dead tissue is today in fact I was able to debride away some of the necrotic tissue because it was doing so well. Fortunately he is showing signs of good improvement as of this week. 02/21/2019 on evaluation today patient feels like he is actually doing a little bit worse noticing the redness on his lower  extremity. I agree I have not seen this before and I am concerned about the possibility of infection. I think we may want to initiate something such as doxycycline and possibly wound culture today to see what exactly may be going on here. 02/28/19 evaluation today patient appears to be doing better with regard to his wound. Heoos been tolerating the dressing changes without complication. Fortunately thereoos no signs of active infection at this time. Overall I feel like the doxycycline has also been helpful for him. Patient History Information obtained from Patient. Allergies No Known Drug Allergies Family History Cancer - Mother,Maternal Grandparents,Siblings, Stroke - Father. Social History Never smoker, Marital Status - Divorced, Alcohol Use - Never, Drug Use - No History, Caffeine Use - Moderate. Medical History Eyes Patient has history of Cataracts - both eyes Respiratory Patient has history of Sleep Apnea - cpap at night Cardiovascular Patient has history of Congestive Heart Failure, Peripheral Venous Disease Musculoskeletal Patient has history of Osteoarthritis Neurologic Patient has history of Neuropathy Medical And Surgical History Notes Gastrointestinal fatty liver Genitourinary chronic kidney disease stg 4 Musculoskeletal Right knee and shoulder replacement, spinal fusion Psychiatric Mild depressed bipolar disorder Review of Systems (ROS) Constitutional Symptoms (General Health) Denies complaints or symptoms of Fatigue, Fever, Chills, Marked Weight Change. Respiratory Denies complaints or symptoms of Chronic or frequent coughs, Shortness of Breath. Cardiovascular Denies complaints or symptoms of Chest pain. Psychiatric Denies complaints or symptoms of Claustrophobia, Suicidal. Objective Constitutional Well-nourished and well-hydrated in no acute distress. Vitals Time Taken: 10:35 AM, Height: 74 in, Weight: 180 lbs, BMI: 23.1, Temperature: 98.8 F, Pulse: 101  bpm, Respiratory Rate: 18 breaths/min, Blood Pressure: 132/74 mmHg. Respiratory normal breathing without difficulty. Psychiatric this patient is able to make decisions and demonstrates good insight into disease process. Alert and Oriented x 3. pleasant and cooperative. General Notes: The patientoos wound bed actually showed signs of good granulation at this point and much less slough noted. He has  been tolerating the dressing changes without any complication and overall Ioom very pleased with how things stand today. The erythema in regard to the leg is also greatly improved compared to last week. Integumentary (Hair, Skin) Wound #1 status is Open. Original cause of wound was Trauma. The wound is located on the Left,Posterior Lower Leg. The wound measures 3.9cm length x 2.3cm width x 0.2cm depth; 7.045cm^2 area and 1.409cm^3 volume. There is Fat Layer (Subcutaneous Tissue) Exposed exposed. There is no tunneling or undermining noted. There is a medium amount of serosanguineous drainage noted. The wound margin is flat and intact. There is medium (34-66%) pink granulation within the wound bed. There is a medium (34-66%) amount of necrotic tissue within the wound bed including Adherent Slough. Assessment Active Problems ICD-10 Venous insufficiency (chronic) (peripheral) Unspecified open wound, left lower leg, initial encounter Non-pressure chronic ulcer of other part of left lower leg with fat layer exposed Chronic kidney disease, stage 4 (severe) Other idiopathic peripheral autonomic neuropathy Procedures Wound #1 Pre-procedure diagnosis of Wound #1 is a Venous Leg Ulcer located on the Left,Posterior Lower Leg .Severity of Tissue Pre Debridement is: Fat layer exposed. There was a Chemical/Enzymatic/Mechanical debridement performed by [name], RN.Marland Kitchen Agent used was [employer]. There was no bleeding. The procedure was tolerated well with a pain level of 0 throughout and a pain level of 0  following the procedure. Post Debridement Measurements: 3.9cm length x 2.3cm width x 0.2cm depth; 1.409cm^3 volume. Character of Wound/Ulcer Post Debridement is improved. Severity of Tissue Post Debridement is: Fat layer exposed. Post procedure Diagnosis Wound #1: Same as Pre-Procedure Pre-procedure diagnosis of Wound #1 is a Venous Leg Ulcer located on the Left,Posterior Lower Leg . There was a Three Layer Compression Therapy Procedure by [name], RN. Post procedure Diagnosis Wound #1: Same as Pre-Procedure Plan Follow-up Appointments: Return Appointment in 1 week. Dressing Change Frequency: Wound #1 Left,Posterior Lower Leg: Do not change entire dressing for one week. Skin Barriers/Peri-Wound Care: Wound #1 Left,Posterior Lower Leg: Moisturizing lotion - to leg Wound Cleansing: Wound #1 Left,Posterior Lower Leg: May shower with protection. Primary Wound Dressing: Wound #1 Left,Posterior Lower Leg: Santyl Ointment Hydrogel - thin layer over santyl Secondary Dressing: Wound #1 Left,Posterior Lower Leg: Dry Gauze ABD pad Edema Control: 3 Layer Compression System - Left Lower Extremity Avoid standing for long periods of time Elevate legs to the level of the heart or above for 30 minutes daily and/or when sitting, a frequency of: - throughout the week Exercise regularly My suggestion at this time it's going to be that we go ahead and continue with the Santel appointment followed by hydrogel in a thin layer over top of the Santel. We will continue with the ABD pad and goals as well for the next week. Over top of this we will continue with the three layer compression wrap. I will plan to see the patient back for a follow up visit in one week. Patient was in the office but subsequently was seen by way of zoom by myself since I was still in quarantine pending a Covid 19 screening test. Electronic Signature(s) Signed: 03/05/2019 10:24:00 AM By: Worthy Keeler PA-C Signed:  03/05/2019 6:02:21 PM By: [name] RN, BSN Previous Signature: 02/28/2019 12:32:42 PM Version By: Worthy Keeler PA-C Entered By: [name] on 03/02/2019 08:28:33 -------------------------------------------------------------------------------- HxROS Details Patient Name: Date of Service: Drury, Ardizzone Twain 02/28/2019 10:30 AM Medical Record [id number] Patient Account Number: [id number] Date of Birth/Sex: Treating RN: [birth year] (78 y.o. M)  [name] Primary Care Provider: Garret Reddish Other Clinician: Referring Provider: Treating Provider/Extender:Stone III, Warner Mccreedy, Alexis Goodell in Treatment: 4 Information Obtained From Patient Constitutional Symptoms (General Health) Complaints and Symptoms: Negative for: Fatigue; Fever; Chills; Marked Weight Change Respiratory Complaints and Symptoms: Negative for: Chronic or frequent coughs; Shortness of Breath Medical History: Positive for: Sleep Apnea - cpap at night Cardiovascular Complaints and Symptoms: Negative for: Chest pain Medical History: Positive for: Congestive Heart Failure; Peripheral Venous Disease Psychiatric Complaints and Symptoms: Negative for: Claustrophobia; Suicidal Medical History: Past Medical History Notes: Mild depressed bipolar disorder Eyes Medical History: Positive for: Cataracts - both eyes Gastrointestinal Medical History: Past Medical History Notes: fatty liver Genitourinary Medical History: Past Medical History Notes: chronic kidney disease stg 4 Musculoskeletal Medical History: Positive for: Osteoarthritis Past Medical History Notes: Right knee and shoulder replacement, spinal fusion Neurologic Medical History: Positive for: Neuropathy HBO Extended History Items Eyes: Cataracts Immunizations Pneumococcal Vaccine: Received Pneumococcal Vaccination: Yes Implantable Devices None Family and Social History Cancer: Yes - Mother,Maternal Grandparents,Siblings;  Stroke: Yes - Father; Never smoker; Marital Status - Divorced; Alcohol Use: Never; Drug Use: No History; Caffeine Use: Moderate; Financial Concerns: No; Food, Clothing or Shelter Needs: No; Support System Lacking: No; Transportation Concerns: No Physician Affirmation I have reviewed and agree with the above information. Electronic Signature(s) Signed: 02/28/2019 12:32:42 PM By: Worthy Keeler PA-C Signed: 02/28/2019 4:59:21 PM By: [name] RN, BSN Entered By: Worthy Keeler on 02/28/2019 10:46:03 -------------------------------------------------------------------------------- SuperBill Details Patient Name: Date of Service: [place], Laventure Regis 02/28/2019 Medical Record [id number] Patient Account Number: [id number] Date of Birth/Sex: Treating RN: September 21, 1940 (78 y.o. Janyth Contes Primary Care Provider: Garret Reddish Other Clinician: Referring Provider: Treating Provider/Extender:Stone III, Warner Mccreedy, Alexis Goodell in Treatment: 4 Diagnosis Coding ICD-10 Codes Code Description I87.2 Venous insufficiency (chronic) (peripheral) S81.802A Unspecified open wound, left lower leg, initial encounter L97.822 Non-pressure chronic ulcer of other part of left lower leg with fat layer exposed N18.4 Chronic kidney disease, stage 4 (severe) G90.09 Other idiopathic peripheral autonomic neuropathy Facility Procedures CPT4 Code: [id number] Description: [id number] - DEBRIDE W/O ANES NON SELECT Modifier: Quantity: 1 Physician Procedures CPT4 Code Description: [id number] - WC PHYS LEVEL 3 - EST PT ICD-10 Diagnosis Description I87.2 Venous insufficiency (chronic) (peripheral) S81.802A Unspecified open wound, left lower leg, initial encounte L97.822 Non-pressure chronic ulcer of other  part of left lower l N18.4 Chronic kidney disease, stage 4 (severe) Modifier: r eg with fat laye Quantity: 1 r exposed Electronic Signature(s) Signed: 02/28/2019 12:32:42 PM By: Worthy Keeler PA-C Entered  By: Worthy Keeler on 02/28/2019 12:28:59

## 2019-03-07 ENCOUNTER — Encounter (HOSPITAL_BASED_OUTPATIENT_CLINIC_OR_DEPARTMENT_OTHER): Payer: Medicare Other | Admitting: Physician Assistant

## 2019-03-07 ENCOUNTER — Other Ambulatory Visit: Payer: Self-pay

## 2019-03-07 DIAGNOSIS — G4733 Obstructive sleep apnea (adult) (pediatric): Secondary | ICD-10-CM | POA: Diagnosis not present

## 2019-03-07 DIAGNOSIS — L97222 Non-pressure chronic ulcer of left calf with fat layer exposed: Secondary | ICD-10-CM | POA: Diagnosis not present

## 2019-03-07 DIAGNOSIS — G4730 Sleep apnea, unspecified: Secondary | ICD-10-CM | POA: Diagnosis not present

## 2019-03-07 DIAGNOSIS — N184 Chronic kidney disease, stage 4 (severe): Secondary | ICD-10-CM | POA: Diagnosis not present

## 2019-03-07 DIAGNOSIS — Z96651 Presence of right artificial knee joint: Secondary | ICD-10-CM | POA: Diagnosis not present

## 2019-03-07 DIAGNOSIS — I872 Venous insufficiency (chronic) (peripheral): Secondary | ICD-10-CM | POA: Diagnosis not present

## 2019-03-07 DIAGNOSIS — I509 Heart failure, unspecified: Secondary | ICD-10-CM | POA: Diagnosis not present

## 2019-03-07 DIAGNOSIS — G629 Polyneuropathy, unspecified: Secondary | ICD-10-CM | POA: Diagnosis not present

## 2019-03-07 DIAGNOSIS — Z96611 Presence of right artificial shoulder joint: Secondary | ICD-10-CM | POA: Diagnosis not present

## 2019-03-07 DIAGNOSIS — L97822 Non-pressure chronic ulcer of other part of left lower leg with fat layer exposed: Secondary | ICD-10-CM | POA: Diagnosis not present

## 2019-03-07 DIAGNOSIS — G9009 Other idiopathic peripheral autonomic neuropathy: Secondary | ICD-10-CM | POA: Diagnosis not present

## 2019-03-07 NOTE — Progress Notes (Addendum)
Yon, Schiffman Eliel ([id number]) Visit Report for 03/07/2019 Chief Complaint Document Details Patient Name: Date of Service: [name], [name] 03/07/2019 11:15 AM Medical Record [id number] Patient Account Number: [id number] Date of Birth/Sex: 04-24-1940 (78 y.o. M) Treating RN: [name] Primary Care Provider: Garret Reddish Other Clinician: Referring Provider: Treating Provider/Extender:Stone III, Warner Mccreedy, Alexis Goodell in Treatment: 5 Information Obtained from: Patient Chief Complaint Left LE Ulcer secondary to trauma but non-healing at this point Electronic Signature(s) Signed: 03/07/2019 12:01:22 PM By: Worthy Keeler PA-C Entered By: Worthy Keeler on 03/07/2019 12:01:21 -------------------------------------------------------------------------------- Debridement Details Patient Name: Date of Service: [name], [name] 03/07/2019 11:15 AM Medical Record [id number] Patient Account Number: [id number] Date of Birth/Sex: Jul 31, 1940 (78 y.o. M) Treating RN: [name] Primary Care Provider: Garret Reddish Other Clinician: Referring Provider: Treating Provider/Extender:Stone III, Warner Mccreedy, Alexis Goodell in Treatment: 5 Debridement Performed for Wound #1 Left,Posterior Lower Leg Assessment: Performed By: Physician Worthy Keeler, PA Debridement Type: Debridement Severity of Tissue Pre Fat layer exposed Debridement: Level of Consciousness (Pre- Awake and Alert procedure): Pre-procedure Verification/Time Out Taken: Yes - 12:20 Start Time: 12:20 Pain Control: Lidocaine 5% topical ointment Total Area Debrided (L x W): 1.8 (cm) x 1.6 (cm) = 2.88 (cm) Tissue and other material Viable, Non-Viable, Slough, Subcutaneous, Biofilm, Slough debrided: Level: Skin/Subcutaneous Tissue Debridement Description: Excisional Instrument: Curette Bleeding: Minimum Hemostasis Achieved: Pressure End Time: 12:22 Procedural Pain: 0 Post Procedural Pain: 0 Response to  Treatment: Procedure was tolerated well Level of Consciousness Awake and Alert (Post-procedure): Post Debridement Measurements of Total Wound Length: (cm) 1.8 Width: (cm) 1.6 Depth: (cm) 0.1 Volume: (cm) 0.226 Character of Wound/Ulcer Post Improved Debridement: Severity of Tissue Post Debridement: Fat layer exposed Post Procedure Diagnosis Same as Pre-procedure Electronic Signature(s) Signed: 03/07/2019 5:37:18 PM By: Worthy Keeler PA-C Signed: 03/08/2019 5:32:06 PM By: [name] RN, BSN Entered By: [name] on 03/07/2019 12:21:27 -------------------------------------------------------------------------------- HPI Details Patient Name: Date of Service: [name], [name] 03/07/2019 11:15 AM Medical Record [id number] Patient Account Number: [id number] Date of Birth/Sex: 1940-10-28 (78 y.o. M) Treating RN: [name] Primary Care Provider: Garret Reddish Other Clinician: Referring Provider: Treating Provider/Extender:Stone III, Warner Mccreedy, Alexis Goodell in Treatment: 5 History of Present Illness HPI Description: 01/31/2019 on evaluation today patient actually appears to be doing poorly unfortunately secondary to an injury that occurred when he hit his leg on a car door September 9. He was seen in the ER where he had 13 sutures placed. His primary care provider remove those 2 weeks later and unfortunately the wound began to dehisce following. It appears that he may have had a skin flap that just did not take and survive. Nonetheless he was stated to have had Keflex given from the ER though the patient states he does not even remember that. He also is stated to have taken 2 rounds of doxycycline by his primary care provider but again the patient states he only remembers the most recent antibiotic which was actually the Augmentin then switched to Bactrim based on the culture results. The culture showed that he had Morganella Morgagni noted in the culture.  Subsequently when he was taking the Bactrim he states that the redness got much better but then unfortunately worsened again once he came off of the medication. Apparently the patient was given triamcinolone ointment to put on the wound although obviously that really did not do much for the wound and again the patient tells me that he was "given the wrong ointment". Again I am not exactly sure  what was going on in that scenario 1 where another. Either way I do believe that he has necrotic tissue on the surface of the wound this can require some debridement if he is able to tolerate it and then subsequently he is also get a need likely a refill of the Bactrim in order to help treat the infection I do not believe this is completely cleared up at this point. I also think that he really needs to have edema control again he is very swollen and I believe that Performing a compression wrap will also be of great benefit for him. The patient does have chronic kidney disease stage IV along with peripheral neuropathy secondary to issues with his back. 02/07/2019 on evaluation today patient appears to be doing a little better with regard to the overall appearance of the wound although it appears to be very dry. Fortunately there is no signs of active infection at this time. No fever chills noted. I really feel like that the patient may be having issues at this point with the Iodoflex being too absorptive for him at this point which is causing this to dry out. I think we may want to try something different to see if that would be of benefit for him. 02/14/2019 on evaluation today patient actually appears to be doing much better with regard to his wound at this time. He has been tolerating the dressing changes without complication. Fortunately everything appears to be doing much better from the standpoint of how soft the dead tissue is today in fact I was able to debride away some of the necrotic tissue because  it was doing so well. Fortunately he is showing signs of good improvement as of this week. 02/21/2019 on evaluation today patient feels like he is actually doing a little bit worse noticing the redness on his lower extremity. I agree I have not seen this before and I am concerned about the possibility of infection. I think we may want to initiate something such as doxycycline and possibly wound culture today to see what exactly may be going on here. 02/28/19 evaluation today patient appears to be doing better with regard to his wound. Hes been tolerating the dressing changes without complication. Fortunately theres no signs of active infection at this time. Overall I feel like the doxycycline has also been helpful for him. 03/07/2019 on evaluation today patient appears to be doing very well with regard to his lower extremity ulcer which is showing excellent improvement. Fortunately there is no evidence of active infection which is good news. No fevers, chills, nausea, vomiting, or diarrhea. Overall I feel like he is making excellent progress towards closure. Electronic Signature(s) Signed: 03/07/2019 1:11:32 PM By: Worthy Keeler PA-C Entered By: Worthy Keeler on 03/07/2019 13:11:32 -------------------------------------------------------------------------------- Physical Exam Details Patient Name: Date of Service: [name], [name] 03/07/2019 11:15 AM Medical Record [id number] Patient Account Number: [id number] Date of Birth/Sex: [birth year] (78 y.o. M) Treating RN: [name] Primary Care Provider: Garret Reddish Other Clinician: Referring Provider: Treating Provider/Extender:Stone III, Warner Mccreedy, Alexis Goodell in Treatment: 5 Constitutional Well-nourished and well-hydrated in no acute distress. Respiratory normal breathing without difficulty. Psychiatric this patient is able to make decisions and demonstrates good insight into disease process. Alert and Oriented x  3. pleasant and cooperative. Notes Patient's wound bed currently showed signs of good granulation at this time there was some slough noted on the surface of the wound which is good to require some sharp debridement today and that  was discussed with the patient. With that being said I did perform debridement to clear away the necrotic tissue from the surface of the wound and post debridement the wound bed appears to be doing much better which is great news I do think that we are ready to switch from the Santyl to using Prisma which hopefully will help to expedite the new tissue growth. Electronic Signature(s) Signed: 03/07/2019 1:12:08 PM By: Worthy Keeler PA-C Entered By: Worthy Keeler on 03/07/2019 13:12:08 -------------------------------------------------------------------------------- Physician Orders Details Patient Name: Date of Service: [name], [name] 03/07/2019 11:15 AM Medical Record [id number] Patient Account Number: [id number] Date of Birth/Sex: 1940-11-18 (78 y.o. M) Treating RN: [name] Primary Care Provider: Garret Reddish Other Clinician: Referring Provider: Treating Provider/Extender:Stone III, Warner Mccreedy, Alexis Goodell in Treatment: 5 Verbal / Phone Orders: No Diagnosis Coding ICD-10 Coding Code Description I87.2 Venous insufficiency (chronic) (peripheral) S81.802A Unspecified open wound, left lower leg, initial encounter L97.822 Non-pressure chronic ulcer of other part of left lower leg with fat layer exposed N18.4 Chronic kidney disease, stage 4 (severe) G90.09 Other idiopathic peripheral autonomic neuropathy Follow-up Appointments Return Appointment in 1 week. Dressing Change Frequency Wound #1 Left,Posterior Lower Leg Do not change entire dressing for one week. Skin Barriers/Peri-Wound Care Wound #1 Left,Posterior Lower Leg Moisturizing lotion - to leg Wound Cleansing Wound #1 Left,Posterior Lower Leg May shower with  protection. Primary Wound Dressing Wound #1 Left,Posterior Lower Leg Silver Collagen - moisten with hydrogel Secondary Dressing Wound #1 Left,Posterior Lower Leg Dry Gauze ABD pad Edema Control 3 Layer Compression System - Left Lower Extremity Avoid standing for long periods of time Elevate legs to the level of the heart or above for 30 minutes daily and/or when sitting, a frequency of: - throughout the week Exercise regularly Electronic Signature(s) Signed: 03/07/2019 5:37:18 PM By: Worthy Keeler PA-C Signed: 03/08/2019 5:32:06 PM By: [name] RN, BSN Entered By: [name] on 03/07/2019 12:22:07 -------------------------------------------------------------------------------- Problem List Details Patient Name: Date of Service: [name], [name] 03/07/2019 11:15 AM Medical Record [id number] Patient Account Number: [id number] Date of Birth/Sex: 07-19-1940 (78 y.o. M) Treating RN: [name] Primary Care Provider: Garret Reddish Other Clinician: Referring Provider: Treating Provider/Extender:Stone III, Warner Mccreedy, Alexis Goodell in Treatment: 5 Active Problems ICD-10 Evaluated Encounter Code Description Active Date Today Diagnosis I87.2 Venous insufficiency (chronic) (peripheral) 01/31/2019 No Yes S81.802A Unspecified open wound, left lower leg, initial 01/31/2019 No Yes encounter L97.822 Non-pressure chronic ulcer of other part of left lower 01/31/2019 No Yes leg with fat layer exposed N18.4 Chronic kidney disease, stage 4 (severe) 01/31/2019 No Yes G90.09 Other idiopathic peripheral autonomic neuropathy 01/31/2019 No Yes Inactive Problems Resolved Problems Electronic Signature(s) Signed: 03/07/2019 12:01:15 PM By: Worthy Keeler PA-C Entered By: Worthy Keeler on 03/07/2019 12:01:15 -------------------------------------------------------------------------------- Progress Note Details Patient Name: Date of Service: Julus, Kelley Karion 03/07/2019  11:15 AM Medical Record [id number] Patient Account Number: [id number] Date of Birth/Sex: March 25, 1940 (78 y.o. M) Treating RN: [name] Primary Care Provider: Garret Reddish Other Clinician: Referring Provider: Treating Provider/Extender:Stone III, Warner Mccreedy, Alexis Goodell in Treatment: 5 Subjective Chief Complaint Information obtained from Patient Left LE Ulcer secondary to trauma but non-healing at this point History of Present Illness (HPI) 01/31/2019 on evaluation today patient actually appears to be doing poorly unfortunately secondary to an injury that occurred when he hit his leg on a car door September 9. He was seen in the ER where he had 13 sutures placed. His primary care provider remove those 2 weeks later and  unfortunately the wound began to dehisce following. It appears that he may have had a skin flap that just did not take and survive. Nonetheless he was stated to have had Keflex given from the ER though the patient states he does not even remember that. He also is stated to have taken 2 rounds of doxycycline by his primary care provider but again the patient states he only remembers the most recent antibiotic which was actually the Augmentin then switched to Bactrim based on the culture results. The culture showed that he had Morganella Morgagni noted in the culture. Subsequently when he was taking the Bactrim he states that the redness got much better but then unfortunately worsened again once he came off of the medication. Apparently the patient was given triamcinolone ointment to put on the wound although obviously that really did not do much for the wound and again the patient tells me that he was "given the wrong ointment". Again I am not exactly sure what was going on in that scenario 1 where another. Either way I do believe that he has necrotic tissue on the surface of the wound this can require some debridement if he is able to tolerate it and  then subsequently he is also get a need likely a refill of the Bactrim in order to help treat the infection I do not believe this is completely cleared up at this point. I also think that he really needs to have edema control again he is very swollen and I believe that Performing a compression wrap will also be of great benefit for him. The patient does have chronic kidney disease stage IV along with peripheral neuropathy secondary to issues with his back. 02/07/2019 on evaluation today patient appears to be doing a little better with regard to the overall appearance of the wound although it appears to be very dry. Fortunately there is no signs of active infection at this time. No fever chills noted. I really feel like that the patient may be having issues at this point with the Iodoflex being too absorptive for him at this point which is causing this to dry out. I think we may want to try something different to see if that would be of benefit for him. 02/14/2019 on evaluation today patient actually appears to be doing much better with regard to his wound at this time. He has been tolerating the dressing changes without complication. Fortunately everything appears to be doing much better from the standpoint of how soft the dead tissue is today in fact I was able to debride away some of the necrotic tissue because it was doing so well. Fortunately he is showing signs of good improvement as of this week. 02/21/2019 on evaluation today patient feels like he is actually doing a little bit worse noticing the redness on his lower extremity. I agree I have not seen this before and I am concerned about the possibility of infection. I think we may want to initiate something such as doxycycline and possibly wound culture today to see what exactly may be going on here. 02/28/19 evaluation today patient appears to be doing better with regard to his wound. Heoos been tolerating the dressing changes without  complication. Fortunately thereoos no signs of active infection at this time. Overall I feel like the doxycycline has also been helpful for him. 03/07/2019 on evaluation today patient appears to be doing very well with regard to his lower extremity ulcer which is showing excellent improvement. Fortunately there  is no evidence of active infection which is good news. No fevers, chills, nausea, vomiting, or diarrhea. Overall I feel like he is making excellent progress towards closure. Patient History Information obtained from Patient. Family History Cancer - Mother,Maternal Grandparents,Siblings, Stroke - Father. Social History Never smoker, Marital Status - Divorced, Alcohol Use - Never, Drug Use - No History, Caffeine Use - Moderate. Medical History Eyes Patient has history of Cataracts - both eyes Respiratory Patient has history of Sleep Apnea - cpap at night Cardiovascular Patient has history of Congestive Heart Failure, Peripheral Venous Disease Musculoskeletal Patient has history of Osteoarthritis Neurologic Patient has history of Neuropathy Medical And Surgical History Notes Gastrointestinal fatty liver Genitourinary chronic kidney disease stg 4 Musculoskeletal Right knee and shoulder replacement, spinal fusion Psychiatric Mild depressed bipolar disorder Review of Systems (ROS) Constitutional Symptoms (General Health) Denies complaints or symptoms of Fatigue, Fever, Chills, Marked Weight Change. Respiratory Denies complaints or symptoms of Chronic or frequent coughs, Shortness of Breath. Cardiovascular Denies complaints or symptoms of Chest pain. Psychiatric Denies complaints or symptoms of Claustrophobia, Suicidal. Objective Constitutional Well-nourished and well-hydrated in no acute distress. Vitals Time Taken: 11:55 AM, Height: 74 in, Weight: 180 lbs, BMI: 23.1, Temperature: 98 F, Pulse: 92 bpm, Respiratory Rate: 18 breaths/min, Blood Pressure: 148/88  mmHg. Respiratory normal breathing without difficulty. Psychiatric this patient is able to make decisions and demonstrates good insight into disease process. Alert and Oriented x 3. pleasant and cooperative. General Notes: Patient's wound bed currently showed signs of good granulation at this time there was some slough noted on the surface of the wound which is good to require some sharp debridement today and that was discussed with the patient. With that being said I did perform debridement to clear away the necrotic tissue from the surface of the wound and post debridement the wound bed appears to be doing much better which is great news I do think that we are ready to switch from the Santyl to using Prisma which hopefully will help to expedite the new tissue growth. Integumentary (Hair, Skin) Wound #1 status is Open. Original cause of wound was Trauma. The wound is located on the Left,Posterior Lower Leg. The wound measures 1.8cm length x 1.6cm width x 0.1cm depth; 2.262cm^2 area and 0.226cm^3 volume. There is Fat Layer (Subcutaneous Tissue) Exposed exposed. There is no tunneling or undermining noted. There is a medium amount of serosanguineous drainage noted. The wound margin is flat and intact. There is medium (34-66%) pink granulation within the wound bed. There is a medium (34-66%) amount of necrotic tissue within the wound bed including Adherent Slough. Assessment Active Problems ICD-10 Venous insufficiency (chronic) (peripheral) Unspecified open wound, left lower leg, initial encounter Non-pressure chronic ulcer of other part of left lower leg with fat layer exposed Chronic kidney disease, stage 4 (severe) Other idiopathic peripheral autonomic neuropathy Procedures Wound #1 Pre-procedure diagnosis of Wound #1 is a Venous Leg Ulcer located on the Left,Posterior Lower Leg .Severity of Tissue Pre Debridement is: Fat layer exposed. There was a Excisional Skin/Subcutaneous Tissue  Debridement with a total area of 2.88 sq cm performed by Worthy Keeler, PA. With the following instrument(s): Curette to remove Viable and Non-Viable tissue/material. Material removed includes Subcutaneous Tissue, Slough, and Biofilm after achieving pain control using Lidocaine 5% topical ointment. No specimens were taken. A time out was conducted at 12:20, prior to the start of the procedure. A Minimum amount of bleeding was controlled with Pressure. The procedure was tolerated well with a  pain level of 0 throughout and a pain level of 0 following the procedure. Post Debridement Measurements: 1.8cm length x 1.6cm width x 0.1cm depth; 0.226cm^3 volume. Character of Wound/Ulcer Post Debridement is improved. Severity of Tissue Post Debridement is: Fat layer exposed. Post procedure Diagnosis Wound #1: Same as Pre-Procedure Pre-procedure diagnosis of Wound #1 is a Venous Leg Ulcer located on the Left,Posterior Lower Leg . There was a Three Layer Compression Therapy Procedure by [name], RN. Post procedure Diagnosis Wound #1: Same as Pre-Procedure Plan Follow-up Appointments: Return Appointment in 1 week. Dressing Change Frequency: Wound #1 Left,Posterior Lower Leg: Do not change entire dressing for one week. Skin Barriers/Peri-Wound Care: Wound #1 Left,Posterior Lower Leg: Moisturizing lotion - to leg Wound Cleansing: Wound #1 Left,Posterior Lower Leg: May shower with protection. Primary Wound Dressing: Wound #1 Left,Posterior Lower Leg: Silver Collagen - moisten with hydrogel Secondary Dressing: Wound #1 Left,Posterior Lower Leg: Dry Gauze ABD pad Edema Control: 3 Layer Compression System - Left Lower Extremity Avoid standing for long periods of time Elevate legs to the level of the heart or above for 30 minutes daily and/or when sitting, a frequency of: - throughout the week Exercise regularly 1 my suggestion at this time is good to be to discontinue the Santyl and we  will subsequently initiate treatment with collagen which I think will be beneficial for the patient. 2. I am also going to recommend at this point that we go ahead and continue with the 3 layer compression wrap that seems to be beneficial in helping to control his edema this is good news. 3. I do recommend the patient still continue to elevate his legs as much as possible although he can be active and exercise when he seated I would like for him to elevate. We will see patient back for reevaluation in 1 week here in the clinic. If anything worsens or changes patient will contact our office for additional recommendations. Electronic Signature(s) Signed: 03/07/2019 1:12:42 PM By: Worthy Keeler PA-C Entered By: Worthy Keeler on 03/07/2019 13:12:41 -------------------------------------------------------------------------------- HxROS Details Patient Name: Date of Service: [name], [name] 03/07/2019 11:15 AM Medical Record [id number] Patient Account Number: [id number] Date of Birth/Sex: 1940-12-09 (78 y.o. M) Treating RN: [name] Primary Care Provider: Garret Reddish Other Clinician: Referring Provider: Treating Provider/Extender:Stone III, Warner Mccreedy, Alexis Goodell in Treatment: 5 Information Obtained From Patient Constitutional Symptoms (General Health) Complaints and Symptoms: Negative for: Fatigue; Fever; Chills; Marked Weight Change Respiratory Complaints and Symptoms: Negative for: Chronic or frequent coughs; Shortness of Breath Medical History: Positive for: Sleep Apnea - cpap at night Cardiovascular Complaints and Symptoms: Negative for: Chest pain Medical History: Positive for: Congestive Heart Failure; Peripheral Venous Disease Psychiatric Complaints and Symptoms: Negative for: Claustrophobia; Suicidal Medical History: Past Medical History Notes: Mild depressed bipolar disorder Eyes Medical History: Positive for: Cataracts - both  eyes Gastrointestinal Medical History: Past Medical History Notes: fatty liver Genitourinary Medical History: Past Medical History Notes: chronic kidney disease stg 4 Musculoskeletal Medical History: Positive for: Osteoarthritis Past Medical History Notes: Right knee and shoulder replacement, spinal fusion Neurologic Medical History: Positive for: Neuropathy HBO Extended History Items Eyes: Cataracts Immunizations Pneumococcal Vaccine: Received Pneumococcal Vaccination: Yes Implantable Devices None Family and Social History Cancer: Yes - Mother,Maternal Grandparents,Siblings; Stroke: Yes - Father; Never smoker; Marital Status - Divorced; Alcohol Use: Never; Drug Use: No History; Caffeine Use: Moderate; Financial Concerns: No; Food, Clothing or Shelter Needs: No; Support System Lacking: No; Transportation Concerns: No Physician Affirmation I have reviewed  and agree with the above information. Electronic Signature(s) Signed: 03/07/2019 5:37:18 PM By: Worthy Keeler PA-C Signed: 03/08/2019 5:32:06 PM By: [name] RN, BSN Entered By: Worthy Keeler on 03/07/2019 13:11:53 -------------------------------------------------------------------------------- SuperBill Details Patient Name: Date of Service: [name], [name] 03/07/2019 Medical Record [id number] Patient Account Number: [id number] Date of Birth/Sex: Treating RN: [birth year] (78 y.o. Janyth Contes Primary Care Provider: Garret Reddish Other Clinician: Referring Provider: Treating Provider/Extender:Stone III, Warner Mccreedy, Alexis Goodell in Treatment: 5 Diagnosis Coding ICD-10 Codes Code Description I87.2 Venous insufficiency (chronic) (peripheral) S81.802A Unspecified open wound, left lower leg, initial encounter L97.822 Non-pressure chronic ulcer of other part of left lower leg with fat layer exposed N18.4 Chronic kidney disease, stage 4 (severe) G90.09 Other idiopathic peripheral autonomic  neuropathy Facility Procedures CPT4 Code Description: [id number] [place] - DEB SUBQ TISSUE 20 SQ CM/< ICD-10 Diagnosis Description L97.822 Non-pressure chronic ulcer of other part of left lower leg wi Modifier: th fat laye Quantity: 1 r exposed Physician Procedures CPT4 Code Description: [id number] - WC PHYS SUBQ TISS 20 SQ CM ICD-10 Diagnosis Description L97.822 Non-pressure chronic ulcer of other part of left lower le Modifier: g with fat laye Quantity: 1 r exposed Electronic Signature(s) Signed: 03/07/2019 1:13:11 PM By: Worthy Keeler PA-C Entered By: Worthy Keeler on 03/07/2019 13:13:10

## 2019-03-08 NOTE — Progress Notes (Signed)
Amel, Gianino Montarius ([id number]) Visit Report for 03/07/2019 Arrival Information Details Patient Name: Date of Service: [name] 03/07/2019 11:15 AM Medical Record [id number] Patient Account Number: [id number] Date of Birth/Sex: 10/31/1940 (78 y.o. M) Treating RN: [name] Primary Care Molina Hollenback: Garret Reddish Other Clinician: Referring Lovelle Deitrick: Treating Bedie Dominey/Extender:Stone III, Warner Mccreedy, Alexis Goodell in Treatment: 5 Visit Information History Since Last Visit Added or deleted any medications: No Patient Arrived: Ambulatory Any new allergies or adverse reactions: No Arrival Time: 11:41 Had a fall or experienced change in No Accompanied By: self activities of daily living that may affect Transfer Assistance: None risk of falls: Patient Identification Verified: Yes Signs or symptoms of abuse/neglect since last No Secondary Verification Process Yes visito Completed: Hospitalized since last visit: No Patient Requires Transmission-Based No Implantable device outside of the clinic excluding No Precautions: cellular tissue based products placed in the center Patient Has Alerts: No since last visit: Has Dressing in Place as Prescribed: Yes Pain Present Now: No Notes per patient stopped taking oral abx 2 days after starting r/t nausea and itching. Electronic Signature(s) Signed: 03/07/2019 5:21:15 PM By: [name] Entered By: [name] on 03/07/2019 11:47:34 -------------------------------------------------------------------------------- Compression Therapy Details Patient Name: Date of Service: [name] 03/07/2019 11:15 AM Medical Record [id number] Patient Account Number: [id number] Date of Birth/Sex: Jan 15, 1941 (78 y.o. M) Treating RN: [name] Primary Care Ramesh Moan: Garret Reddish Other Clinician: Referring Trinisha Paget: Treating Liseth Wann/Extender:Stone III, Warner Mccreedy, Alexis Goodell in Treatment: 5 Compression Therapy  Performed for Wound Wound #1 Left,Posterior Lower Leg Assessment: Performed By: Clinician [name], RN Compression Type: Three Layer Post Procedure Diagnosis Same as Pre-procedure Electronic Signature(s) Signed: 03/08/2019 5:32:06 PM By: [name] RN, BSN Entered By: [name] on 03/07/2019 12:21:38 -------------------------------------------------------------------------------- Encounter Discharge Information Details Patient Name: Date of Service: [name] 03/07/2019 11:15 AM Medical Record [id number] Patient Account Number: [id number] Date of Birth/Sex: 09-24-1940 (78 y.o. M) Treating RN: [name] Primary Care Daaiyah Baumert: Garret Reddish Other Clinician: Referring Edee Nifong: Treating Jasir Rother/Extender:Stone III, Warner Mccreedy, Alexis Goodell in Treatment: 5 Encounter Discharge Information Items Post Procedure Vitals Discharge Condition: Stable Temperature (F): 98 Ambulatory Status: Ambulatory Pulse (bpm): 92 Discharge Destination: Home Respiratory Rate (breaths/min): 18 Transportation: Private Auto Blood Pressure (mmHg): 148/88 Accompanied By: self Schedule Follow-up Appointment: Yes Clinical Summary of Care: Electronic Signature(s) Signed: 03/07/2019 5:21:15 PM By: [name] Entered By: [name] on 03/07/2019 12:36:43 -------------------------------------------------------------------------------- Lower Extremity Assessment Details Patient Name: Date of Service: [name] 03/07/2019 11:15 AM Medical Record [id number] Patient Account Number: [id number] Date of Birth/Sex: 1940/12/01 (78 y.o. M) Treating RN: [name] Primary Care Christoper Bushey: Garret Reddish Other Clinician: Referring Irais Mottram: Treating Verleen Stuckey/Extender:Stone III, Warner Mccreedy, Alexis Goodell in Treatment: 5 Edema Assessment Assessed: [Left: No] [Right: No] Edema: [Left: Ye] [Right: s] Calf Left: Right: Point of Measurement: 31 cm From Medial Instep  37 cm cm Ankle Left: Right: Point of Measurement: 12 cm From Medial Instep 26.5 cm cm Vascular Assessment Pulses: Dorsalis Pedis Palpable: [Left:Yes] Electronic Signature(s) Signed: 03/07/2019 5:21:15 PM By: [name] Entered By: [name] on 03/07/2019 11:50:38 -------------------------------------------------------------------------------- [facility] Details Patient Name: Date of Service: [name] 03/07/2019 11:15 AM Medical Record [id number] Patient Account Number: [id number] Date of Birth/Sex: February 18, 1941 (78 y.o. M) Treating RN: [name] Primary Care Kaylea Mounsey: Garret Reddish Other Clinician: Referring Dereon Williamsen: Treating Alantra Popoca/Extender:Stone III, Warner Mccreedy, Alexis Goodell in Treatment: 5 Active Inactive Venous Leg Ulcer Nursing Diagnoses: Knowledge deficit related to disease process and management Potential for venous Insuffiency (use before diagnosis confirmed) Goals:  Patient will maintain optimal edema control Date Initiated: 01/31/2019 Target Resolution Date: 03/30/2019 Goal Status: Active Interventions: Assess peripheral edema status every visit. Compression as ordered Treatment Activities: Therapeutic compression applied : 01/31/2019 Notes: Wound/Skin Impairment Nursing Diagnoses: Impaired tissue integrity Knowledge deficit related to ulceration/compromised skin integrity Goals: Patient/caregiver will verbalize understanding of skin care regimen Date Initiated: 01/31/2019 Target Resolution Date: 03/30/2019 Goal Status: Active Ulcer/skin breakdown will have a volume reduction of 30% by week 4 Date Initiated: 01/31/2019 Date Inactivated: 03/07/2019 Target Resolution Date: 02/28/2019 Goal Status: Met Ulcer/skin breakdown will have a volume reduction of 50% by week 8 Date Initiated: 03/07/2019 Target Resolution Date: 03/30/2019 Goal Status: Active Interventions: Assess patient/caregiver ability to obtain necessary  supplies Assess patient/caregiver ability to perform ulcer/skin care regimen upon admission and as needed Assess ulceration(s) every visit Treatment Activities: Skin care regimen initiated : 01/31/2019 Topical wound management initiated : 01/31/2019 Notes: Electronic Signature(s) Signed: 03/08/2019 5:32:06 PM By: [name] RN, BSN Entered By: [name] on 03/07/2019 16:39:57 -------------------------------------------------------------------------------- Pain Assessment Details Patient Name: Date of Service: Lisandro, Meggett Eladio 03/07/2019 11:15 AM Medical Record [id number] Patient Account Number: [id number] Date of Birth/Sex: [birth year] (78 y.o. M) Treating RN: [name] Primary Care Jermar Colter: Garret Reddish Other Clinician: Referring Olivya Sobol: Treating Whitfield Dulay/Extender:Stone III, Warner Mccreedy, Alexis Goodell in Treatment: 5 Active Problems Location of Pain Severity and Description of Pain Patient Has Paino No Site Locations Pain Management and Medication Current Pain Management: Electronic Signature(s) Signed: 03/07/2019 5:21:15 PM By: [name] Entered By: [name] on 03/07/2019 11:56:27 -------------------------------------------------------------------------------- Patient/Caregiver Education Details Patient Name: [name], Keyden 12/16/2020andnbsp11:15 Date of Service: AM Medical Record [id number] Number: Patient Account Number: [id number] Treating RN: Date of Birth/Gender: January 10, 1941 (78 y.o. Janyth Contes) Other Clinician: Primary Care Treating Hunter, Rosilyn Mings Physician: Physician/Extender: Referring Physician: Kaylyn Layer in Treatment: 5 Education Assessment Education Provided To: Patient Education Topics Provided Wound/Skin Impairment: Methods: Explain/Verbal Responses: State content correctly Electronic Signature(s) Signed: 03/08/2019 5:32:06 PM By: [name] RN, BSN Entered By: [name] on  03/07/2019 16:40:06 -------------------------------------------------------------------------------- Wound Assessment Details Patient Name: Date of Service: Menelik, Mcfarren Ammon 03/07/2019 11:15 AM Medical Record [id number] Patient Account Number: [id number] Date of Birth/Sex: 02-Mar-1941 (78 y.o. M) Treating RN: [name] Primary Care Sharea Guinther: Garret Reddish Other Clinician: Referring Geraldo Haris: Treating Mirai Greenwood/Extender:Stone III, Warner Mccreedy, Alexis Goodell in Treatment: 5 Wound Status Wound Number: 1 Primary Venous Leg Ulcer Etiology: Wound Location: Left Lower Leg - Posterior Wound Open Wounding Event: Trauma Status: Date Acquired: 11/29/2018 Comorbid Cataracts, Sleep Apnea, Congestive Heart Weeks Of Treatment: 5 History: Failure, Peripheral Venous Disease, Clustered Wound: No Osteoarthritis, Neuropathy Photos Wound Measurements Length: (cm) 1.8 % Reduct Width: (cm) 1.6 % Reduct Depth: (cm) 0.1 Epitheli Area: (cm) 2.262 Tunneli Volume: (cm) 0.226 Undermi Wound Description Classification: Full Thickness Without Exposed Support Foul Od Structures Slough/ Wound Flat and Intact Margin: Exudate Medium Amount: Exudate Serosanguineous Type: Exudate red, brown Color: Wound Bed Granulation Amount: Medium (34-66%) Granulation Quality: Pink Fascia E Necrotic Amount: Medium (34-66%) Fat Laye Necrotic Quality: Adherent Slough Tendon E Muscle E Joint Ex Bone Expos or After Cleansing: No Fibrino Yes Exposed Structure xposed: No r (Subcutaneous Tissue) Exposed: Yes xposed: No xposed: No posed: No ed: No ion in Area: 81.6% ion in Volume: 90.8% alization: Small (1-33%) ng: No ning: No Treatment Notes Wound #1 (Left, Posterior Lower Leg) 1. Cleanse With Wound Cleanser Soap and water 2. Periwound Care Moisturizing lotion 3. Primary Dressing Applied Collegen AG Hydrogel or K-Y Jelly 4. Secondary  Dressing ABD Pad Dry Gauze 6. Support Layer  Applied 3 layer compression wrap Notes netting. Electronic Signature(s) Signed: 03/08/2019 3:39:50 PM By: Mikeal Hawthorne EMT/HBOT Signed: 03/08/2019 5:34:18 PM By: [name] Previous Signature: 03/07/2019 5:21:15 PM Version By: [name] Entered By: Mikeal Hawthorne on 03/08/2019 14:28:37 -------------------------------------------------------------------------------- Vitals Details Patient Name: Date of Service: SERVISS, Hriday 03/07/2019 11:15 AM Medical Record [id number] Patient Account Number: [id number] Date of Birth/Sex: Dec 18, 1940 (78 y.o. M) Treating RN: [name] Primary Care Winferd Wease: Garret Reddish Other Clinician: Referring Frederik Standley: Treating Kathaleen Dudziak/Extender:Stone III, Warner Mccreedy, Alexis Goodell in Treatment: 5 Vital Signs Time Taken: 11:55 Temperature (F): 98 Height (in): 74 Pulse (bpm): 92 Weight (lbs): 180 Respiratory Rate (breaths/min): 18 Body Mass Index (BMI): 23.1 Blood Pressure (mmHg): 148/88 Reference Range: 80 - 120 mg / dl Electronic Signature(s) Signed: 03/07/2019 5:21:15 PM By: [name] Entered By: [name] on 03/07/2019 11:56:19

## 2019-03-12 DIAGNOSIS — H2511 Age-related nuclear cataract, right eye: Secondary | ICD-10-CM | POA: Diagnosis not present

## 2019-03-12 DIAGNOSIS — H25811 Combined forms of age-related cataract, right eye: Secondary | ICD-10-CM | POA: Diagnosis not present

## 2019-03-12 DIAGNOSIS — Z961 Presence of intraocular lens: Secondary | ICD-10-CM | POA: Diagnosis not present

## 2019-03-13 ENCOUNTER — Ambulatory Visit: Payer: Medicare Other | Admitting: Family Medicine

## 2019-03-14 ENCOUNTER — Encounter (HOSPITAL_BASED_OUTPATIENT_CLINIC_OR_DEPARTMENT_OTHER): Payer: Medicare Other | Admitting: Physician Assistant

## 2019-03-14 ENCOUNTER — Ambulatory Visit: Payer: Medicare Other

## 2019-03-14 ENCOUNTER — Ambulatory Visit (INDEPENDENT_AMBULATORY_CARE_PROVIDER_SITE_OTHER): Payer: Medicare Other | Admitting: Family Medicine

## 2019-03-14 ENCOUNTER — Encounter: Payer: Self-pay | Admitting: Family Medicine

## 2019-03-14 ENCOUNTER — Other Ambulatory Visit: Payer: Self-pay

## 2019-03-14 VITALS — BP 120/70 | HR 66 | Temp 97.8°F | Ht 62.0 in | Wt 186.0 lb

## 2019-03-14 DIAGNOSIS — M545 Low back pain, unspecified: Secondary | ICD-10-CM

## 2019-03-14 DIAGNOSIS — I5032 Chronic diastolic (congestive) heart failure: Secondary | ICD-10-CM

## 2019-03-14 DIAGNOSIS — N184 Chronic kidney disease, stage 4 (severe): Secondary | ICD-10-CM

## 2019-03-14 DIAGNOSIS — M810 Age-related osteoporosis without current pathological fracture: Secondary | ICD-10-CM | POA: Diagnosis not present

## 2019-03-14 DIAGNOSIS — G8929 Other chronic pain: Secondary | ICD-10-CM

## 2019-03-14 DIAGNOSIS — M79602 Pain in left arm: Secondary | ICD-10-CM | POA: Diagnosis not present

## 2019-03-14 DIAGNOSIS — S42295A Other nondisplaced fracture of upper end of left humerus, initial encounter for closed fracture: Secondary | ICD-10-CM | POA: Diagnosis not present

## 2019-03-14 LAB — LIPID PANEL
Cholesterol: 140 mg/dL (ref 0–200)
HDL: 57.1 mg/dL (ref >39.00)
LDL Cholesterol: 68 mg/dL (ref 0–99)
NonHDL: 82.57
Total CHOL/HDL Ratio: 2
Triglycerides: 71 mg/dL (ref 0.0–149.0)
VLDL: 14.2 mg/dL (ref 0.0–40.0)

## 2019-03-14 LAB — COMPREHENSIVE METABOLIC PANEL
ALT: 10 U/L (ref 0–53)
AST: 15 U/L (ref 0–37)
Albumin: 3.8 g/dL (ref 3.5–5.2)
Alkaline Phosphatase: 89 U/L (ref 39–117)
BUN: 49 mg/dL — ABNORMAL HIGH (ref 6–23)
CO2: 27 mEq/L (ref 19–32)
Calcium: 9 mg/dL (ref 8.4–10.5)
Chloride: 100 mEq/L (ref 96–112)
Creatinine, Ser: 2.43 mg/dL — ABNORMAL HIGH (ref 0.40–1.50)
GFR: 25.87 mL/min — ABNORMAL LOW (ref >60.00)
Glucose, Bld: 110 mg/dL — ABNORMAL HIGH (ref 70–99)
Potassium: 3.4 mEq/L — ABNORMAL LOW (ref 3.5–5.1)
Sodium: 136 mEq/L (ref 135–145)
Total Bilirubin: 0.4 mg/dL (ref 0.2–1.2)
Total Protein: 5.9 g/dL — ABNORMAL LOW (ref 6.0–8.3)

## 2019-03-14 LAB — CBC WITH DIFFERENTIAL/PLATELET
Basophils Absolute: 0.1 10*3/uL (ref 0.0–0.1)
Basophils Relative: 0.5 % (ref 0.0–3.0)
Eosinophils Absolute: 0.2 10*3/uL (ref 0.0–0.7)
Eosinophils Relative: 2.4 % (ref 0.0–5.0)
HCT: 32.8 % — ABNORMAL LOW (ref 39.0–52.0)
Hemoglobin: 11 g/dL — ABNORMAL LOW (ref 13.0–17.0)
Lymphocytes Relative: 10.1 % — ABNORMAL LOW (ref 12.0–46.0)
Lymphs Abs: 1 10*3/uL (ref 0.7–4.0)
MCHC: 33.6 g/dL (ref 30.0–36.0)
MCV: 96.1 fl (ref 78.0–100.0)
Monocytes Absolute: 1.3 10*3/uL — ABNORMAL HIGH (ref 0.1–1.0)
Monocytes Relative: 12.9 % — ABNORMAL HIGH (ref 3.0–12.0)
Neutro Abs: 7.3 10*3/uL (ref 1.4–7.7)
Neutrophils Relative %: 74.1 % (ref 43.0–77.0)
Platelets: 174 10*3/uL (ref 150.0–400.0)
RBC: 3.41 Mil/uL — ABNORMAL LOW (ref 4.22–5.81)
RDW: 12.9 % (ref 11.5–15.5)
WBC: 9.8 10*3/uL (ref 4.0–10.5)

## 2019-03-14 NOTE — Patient Instructions (Addendum)
Please stop by lab and  x-ray before you go If you do not have mychart- we will call you about results within 5 business days of [place] receiving them.  If you have mychart- we will send your results within 3 business days of [place] receiving them.  If abnormal or we want to clarify a result, we will call or mychart you to make sure you receive the message.  If you have questions or concerns or don't hear within 5-7 days, please send [place] a message or call us.   Team I entered urgent referral to orthopedics for left arm pain- please help patient get set up before he leaves or give him info on when he should hear- hoping he can be seen today or tomorrow if possible   No other changes today other than trying to get you set up with [facility]- we will reach out about this

## 2019-03-14 NOTE — Progress Notes (Signed)
Phone [phone] In person visit   Subjective:   [name] is a 78 y.o. year old very pleasant male patient who presents for/with See problem oriented charting Chief Complaint  Patient presents with  . Follow-up   ROS-complains of left arm pain after fall.  No fever or chills.  No redness of her arm.  Swelling is stable  This visit occurred during the SARS-CoV-2 public health emergency.  Safety protocols were in place, including screening questions prior to the visit, additional usage of staff PPE, and extensive cleaning of exam room while observing appropriate contact time as indicated for disinfecting solutions.   Past Medical History-  Patient Active Problem List   Diagnosis Date Noted  . Chronic diastolic CHF (congestive heart failure) ([place]) 11/30/2017    Priority: High  . Mild depressed bipolar I disorder ([place]) 09/21/2006    Priority: High  . Chronic low back pain with degenerative lumbar spinal stenosis-minimal improvement with surgery late 2019 03/08/2017    Priority: Medium  . Gout 07/07/2016    Priority: Medium  . Fatty liver 10/09/2014    Priority: Medium  . OSA (obstructive sleep apnea) 10/30/2008    Priority: Medium  . Osteoporosis 09/25/2008    Priority: Medium  . CKD (chronic kidney disease), stage IV ([place]) 03/01/2008    Priority: Medium  . History of fracture of left hip 09/01/2017    Priority: Low  . Senile purpura ([place]) 06/22/2017    Priority: Low  . Osteoarthritis of spine 03/21/2017    Priority: Low  . Hypersomnolence 08/20/2016    Priority: Low  . Other constipation 01/13/2016    Priority: Low  . Primary osteoarthritis of right knee 12/08/2015    Priority: Low  . Gallstones 10/09/2014    Priority: Low  . Bilateral lower extremity edema 09/25/2014    Priority: Low  . Chronic venous insufficiency 08/29/2013    Priority: Low  . Right shoulder pain 08/29/2013    Priority: Low  . Neuropathy 01/20/2011    Priority: Low  . Paroxysmal  tachycardia ([place]) 12/05/2018  . Spondylolisthesis of lumbar region 03/10/2018  . Leukocytosis 09/04/2017    Medications- reviewed and updated Current Outpatient Medications  Medication Sig Dispense Refill  . acetaminophen (TYLENOL 8 HOUR) 650 MG CR tablet Take 1,300 mg by mouth 2 (two) times daily.     . ARIPiprazole (ABILIFY) 5 MG tablet Take 1 tablet (5 mg total) by mouth daily. (Patient taking differently: Take 2.5 mg by mouth daily. ) 90 tablet 3  . b complex vitamins tablet Take 1 tablet by mouth daily.    . Calcium-Vitamin D (CALTRATE 600 PLUS-VIT D PO) Take 1 tablet by mouth daily.     . ferrous sulfate 325 (65 FE) MG tablet Take 325 mg by mouth daily with breakfast.    . torsemide (DEMADEX) 20 MG tablet TAKE 1 TABLET BY MOUTH DAILY AS NEEDED FOR EDEMA, WEIGHT GAIN 90 tablet 1  . tranylcypromine (PARNATE) 10 MG tablet Take 30 mg by mouth 2 (two) times daily.      No current facility-administered medications for this visit.     Objective:  BP 120/70   Pulse 66   Temp 97.8 F (36.6 C)   Ht 5\' 2"  (1.575 m)   Wt 186 lb (84.4 kg)   BMI 34.02 kg/m  Gen: NAD, resting comfortably CV: RRR no murmurs rubs or gallops Lungs: CTAB no crackles, wheeze, rhonch Ext: trace edema Skin: warm, dry MSK: Has a hard time lifting  his left arm due to pain.  Complains of pain with palpation along distal portion of humerus    Assessment and Plan   # left leg wound after prior laceration now 70% healed- continues with wound care. Recently dressed and didn't want to undress today  # left arm pain S:Pt states he fell yesterday -He got out of a chair yesterday and fell towards his left side. Has cane and walker but was not using- arm is so sore doesn't feel like he could get cane/walker in the car.   He reports severe pain over mid to distal portion of upper arm.  Feels like he has a hard time moving his arm at all.  Aching pain-10/10 pain when moves A/P: We originally ordered x-rays for concern  of fracture.  We also entered an urgent referral to orthopedics-fortunately they were able to work him in almost immediately.  Patient ultimately decided not to get x-ray here as a result.  #Osteoporosis S: Patient is compliant with calcium and vitamin D.  Prolia was started last year 08/19/2017-unclear why this was not continued.  Patient's nephrologist previously was okay with [place] using Prolia-Dr. Florene Glen A/P: Suspect poor control and with fall risk increases risk of significant fracture-we will reach out to team to see if we can restart the Prolia.  He should continue calcium and vitamin D  Message to Corazon Nickolas team "Team- patient started prolia 08/19/17- unclear why not continued- can we get him set back up with this?  He has calcium and vitamin D on his list-please make sure it is at least 800 units of vitamin D and 600 mg of calcium per day (with his kidney function using lower dose). We will recheck kidneys/calcium next visit"  #Leukocytosis at last visit-update CBC with differential today.  Possible would still be elevated with recent fall and potential stress demargination  #Chronic diastolic congestive heart failure S: Compliant with torsemide 20 mg- takes most days. No recent weight gain or increased leg swelling 2019-Has seen Dr. Radford Pax. Stress test ok as well as Holter monitor A/P: Appears to be stable/euvolemic-continue current medication   #CKD stage IV S: Follows with [facility].  Remains on torsemide 20 mg daily.  Knows to avoid NSAIDs. A/P: Doing well without signs of fluid overload as above-continue torsemide and nephrology follow-up   #Chronic low back pain- degenerative lumbar stenosis.  Minimal improvement with surgery late 2019. Ongoing pain with this.  Sees orthopedics in a few weeks to follo wup  #Lipid management-we discussed newer more aggressive goals for LDL of 70 or less-update lipid panel and consider statin if LDL above 70 likely only once a week Lab Results   Component Value Date   CHOL 154 06/24/2017   HDL 66.00 06/24/2017   LDLCALC 74 06/24/2017   TRIG 68.0 06/24/2017   CHOLHDL 2 06/24/2017   Recommended follow up: Return in about 4 months (around 07/13/2019) for follow up- or sooner if needed.   Lab/Order associations:   ICD-10-CM   1. Chronic diastolic CHF (congestive heart failure) (H[facility])  I50.32   2. CKD (chronic kidney disease), stage IV ([facility])  N18.4 CBC with Differential    Comprehensive metabolic panel    Lipid panel  3. Chronic bilateral low back pain, unspecified whether sciatica present  M54.5    G89.29   4. Age-related osteoporosis without current pathological fracture  M81.0   5. Left arm pain  M79.602 DG Humerus Left    Ambulatory referral to Orthopedic Surgery  Return precautions advised.  Garret Reddish, MD

## 2019-03-15 ENCOUNTER — Telehealth: Payer: Self-pay

## 2019-03-15 NOTE — Telephone Encounter (Signed)
Called patient no answer per DPR ok to talk to girlfriend so I called her to let her know we were not one to call anything in recommended she call orthopedics and address with them he was seen there yesterday.

## 2019-03-15 NOTE — Telephone Encounter (Signed)
Copied from [place] [id number]. Topic: General - Other >> Mar 15, 2019 12:03 PM Leward Quan A wrote: Reason for CRM: Patient friend Luther Redo called to say that the medication prescribed for pain is not working for the patient. States that he was up all night got very little sleep and is now up in lots of pain. She is asking if there is something stronger that can be sent to the pharmacy please Ph# [phone]

## 2019-03-19 ENCOUNTER — Encounter (HOSPITAL_BASED_OUTPATIENT_CLINIC_OR_DEPARTMENT_OTHER): Payer: Medicare Other | Admitting: Internal Medicine

## 2019-03-19 DIAGNOSIS — I872 Venous insufficiency (chronic) (peripheral): Secondary | ICD-10-CM | POA: Diagnosis not present

## 2019-03-19 DIAGNOSIS — Z96611 Presence of right artificial shoulder joint: Secondary | ICD-10-CM | POA: Diagnosis not present

## 2019-03-19 DIAGNOSIS — G4730 Sleep apnea, unspecified: Secondary | ICD-10-CM | POA: Diagnosis not present

## 2019-03-19 DIAGNOSIS — Z981 Arthrodesis status: Secondary | ICD-10-CM | POA: Diagnosis not present

## 2019-03-19 DIAGNOSIS — G9009 Other idiopathic peripheral autonomic neuropathy: Secondary | ICD-10-CM | POA: Diagnosis not present

## 2019-03-19 DIAGNOSIS — L97822 Non-pressure chronic ulcer of other part of left lower leg with fat layer exposed: Secondary | ICD-10-CM | POA: Diagnosis not present

## 2019-03-19 DIAGNOSIS — N184 Chronic kidney disease, stage 4 (severe): Secondary | ICD-10-CM | POA: Diagnosis not present

## 2019-03-19 DIAGNOSIS — M5136 Other intervertebral disc degeneration, lumbar region: Secondary | ICD-10-CM | POA: Diagnosis not present

## 2019-03-19 DIAGNOSIS — Z96651 Presence of right artificial knee joint: Secondary | ICD-10-CM | POA: Diagnosis not present

## 2019-03-19 DIAGNOSIS — I509 Heart failure, unspecified: Secondary | ICD-10-CM | POA: Diagnosis not present

## 2019-03-19 DIAGNOSIS — M961 Postlaminectomy syndrome, not elsewhere classified: Secondary | ICD-10-CM | POA: Diagnosis not present

## 2019-03-19 DIAGNOSIS — G629 Polyneuropathy, unspecified: Secondary | ICD-10-CM | POA: Diagnosis not present

## 2019-03-19 DIAGNOSIS — M545 Low back pain: Secondary | ICD-10-CM | POA: Diagnosis not present

## 2019-03-19 DIAGNOSIS — L97222 Non-pressure chronic ulcer of left calf with fat layer exposed: Secondary | ICD-10-CM | POA: Diagnosis not present

## 2019-03-19 NOTE — Progress Notes (Signed)
[name], [name] ([id number]) Visit Report for 03/19/2019 HPI Details Patient Name: Date of Service: [name], [name] 03/19/2019 9:30 AM Medical Record [id number] Patient Account Number: [id number] Date of Birth/Sex: Treating RN: 03-03-1941 (78 y.o. M) Primary Care Provider: Garret Reddish Other Clinician: Referring Provider: Treating Provider/Extender:Blaze Nylund, Kandice Hams, Alexis Goodell in Treatment: 6 History of Present Illness HPI Description: 01/31/2019 on evaluation today patient actually appears to be doing poorly unfortunately secondary to an injury that occurred when he hit his leg on a car door September 9. He was seen in the ER where he had 13 sutures placed. His primary care provider remove those 2 weeks later and unfortunately the wound began to dehisce following. It appears that he may have had a skin flap that just did not take and survive. Nonetheless he was stated to have had Keflex given from the ER though the patient states he does not even remember that. He also is stated to have taken 2 rounds of doxycycline by his primary care provider but again the patient states he only remembers the most recent antibiotic which was actually the Augmentin then switched to Bactrim based on the culture results. The culture showed that he had Morganella Morgagni noted in the culture. Subsequently when he was taking the Bactrim he states that the redness got much better but then unfortunately worsened again once he came off of the medication. Apparently the patient was given triamcinolone ointment to put on the wound although obviously that really did not do much for the wound and again the patient tells me that he was "given the wrong ointment". Again I am not exactly sure what was going on in that scenario 1 where another. Either way I do believe that he has necrotic tissue on the surface of the wound this can require some debridement if he is able to tolerate it and then  subsequently he is also get a need likely a refill of the Bactrim in order to help treat the infection I do not believe this is completely cleared up at this point. I also think that he really needs to have edema control again he is very swollen and I believe that Performing a compression wrap will also be of great benefit for him. The patient does have chronic kidney disease stage IV along with peripheral neuropathy secondary to issues with his back. 02/07/2019 on evaluation today patient appears to be doing a little better with regard to the overall appearance of the wound although it appears to be very dry. Fortunately there is no signs of active infection at this time. No fever chills noted. I really feel like that the patient may be having issues at this point with the Iodoflex being too absorptive for him at this point which is causing this to dry out. I think we may want to try something different to see if that would be of benefit for him. 02/14/2019 on evaluation today patient actually appears to be doing much better with regard to his wound at this time. He has been tolerating the dressing changes without complication. Fortunately everything appears to be doing much better from the standpoint of how soft the dead tissue is today in fact I was able to debride away some of the necrotic tissue because it was doing so well. Fortunately he is showing signs of good improvement as of this week. 02/21/2019 on evaluation today patient feels like he is actually doing a little bit worse noticing the redness on his lower extremity. I agree  I have not seen this before and I am concerned about the possibility of infection. I think we may want to initiate something such as doxycycline and possibly wound culture today to see what exactly may be going on here. 02/28/19 evaluation today patient appears to be doing better with regard to his wound. Hes been tolerating the dressing changes without  complication. Fortunately theres no signs of active infection at this time. Overall I feel like the doxycycline has also been helpful for him. 03/07/2019 on evaluation today patient appears to be doing very well with regard to his lower extremity ulcer which is showing excellent improvement. Fortunately there is no evidence of active infection which is good news. No fevers, chills, nausea, vomiting, or diarrhea. Overall I feel like he is making excellent progress towards closure. 12/28; since the patient was last here he fell and apparently fractured his shoulder in 2 places. He is seeing orthopedics. Kept his wrap on for 2 weeks. He has a venous insufficiency wound on the posterior left calf. We have been using silver collagen under 3 layer compression Electronic Signature(s) Signed: 03/19/2019 6:10:19 PM By: Linton Ham MD Entered By: Linton Ham on 03/19/2019 13:33:30 -------------------------------------------------------------------------------- Physical Exam Details Patient Name: Date of Service: [name], [name] 03/19/2019 9:30 AM Medical Record [id number] Patient Account Number: [id number] Date of Birth/Sex: Treating RN: [birth year] (78 y.o. M) Primary Care Provider: Garret Reddish Other Clinician: Referring Provider: Treating Provider/Extender:Tomica Arseneault, Kandice Hams, Alexis Goodell in Treatment: 6 Constitutional Sitting or standing Blood Pressure is within target range for patient.. Pulse regular and within target range for patient.Marland Kitchen Respirations regular, non-labored and within target range.. Temperature is normal and within the target range for the patient.Marland Kitchen Appears in no distress. Cardiovascular Pedal pulses are palpable. Edema control is marginal. I wonder if the wrap fell down in the 2-week hiatus. Integumentary (Hair, Skin) No erythema around the wound. Changes of chronic venous insufficiency. Notes Wound exam; I think this shows signs of decent granulation  tissue. No debridement is required although I did debride the wound with Anasept and gauze. Has edema around the wound. I wonder if this fell down in a 2-week hiatus Electronic Signature(s) Signed: 03/19/2019 6:10:19 PM By: Linton Ham MD Entered By: Linton Ham on 03/19/2019 13:35:05 -------------------------------------------------------------------------------- Physician Orders Details Patient Name: Date of Service: [name], [name] 03/19/2019 9:30 AM Medical Record [id number] Patient Account Number: [id number] Date of Birth/Sex: Treating RN: 1940/06/27 (78 y.o. Janyth Contes Primary Care Provider: Garret Reddish Other Clinician: Referring Provider: Treating Provider/Extender:Allean Montfort, Kandice Hams, Alexis Goodell in Treatment: 6 Verbal / Phone Orders: No Diagnosis Coding ICD-10 Coding Code Description I87.2 Venous insufficiency (chronic) (peripheral) S81.802A Unspecified open wound, left lower leg, initial encounter L97.822 Non-pressure chronic ulcer of other part of left lower leg with fat layer exposed N18.4 Chronic kidney disease, stage 4 (severe) G90.09 Other idiopathic peripheral autonomic neuropathy Follow-up Appointments Return Appointment in 1 week. - Wednesday with Margarita Grizzle Dressing Change Frequency Wound #1 Left,Posterior Lower Leg Do not change entire dressing for one week. Skin Barriers/Peri-Wound Care Wound #1 Left,Posterior Lower Leg Moisturizing lotion - to leg Wound Cleansing Wound #1 Left,Posterior Lower Leg May shower with protection. Primary Wound Dressing Wound #1 Left,Posterior Lower Leg Silver Collagen - moisten with hydrogel Secondary Dressing Wound #1 Left,Posterior Lower Leg Dry Gauze ABD pad Edema Control 3 Layer Compression System - Left Lower Extremity Avoid standing for long periods of time Elevate legs to the level of the heart or above for 30 minutes daily and/or when sitting, a  frequency of: - throughout the  week Exercise regularly Electronic Signature(s) Signed: 03/19/2019 6:10:06 PM By: [name] RN, BSN Signed: 03/19/2019 6:10:19 PM By: Linton Ham MD Entered By: [name] on 03/19/2019 11:41:00 -------------------------------------------------------------------------------- Problem List Details Patient Name: Date of Service: [name], [name] 03/19/2019 9:30 AM Medical Record [id number] Patient Account Number: [id number] Date of Birth/Sex: Treating RN: [birth year] (78 y.o. Janyth Contes Primary Care Provider: Other Clinician: Garret Reddish Referring Provider: Treating Provider/Extender:Jahleah Mariscal, Kandice Hams, Alexis Goodell in Treatment: 6 Active Problems ICD-10 Evaluated Encounter Code Description Active Date Today Diagnosis I87.2 Venous insufficiency (chronic) (peripheral) 01/31/2019 No Yes S81.802A Unspecified open wound, left lower leg, initial 01/31/2019 No Yes encounter L97.822 Non-pressure chronic ulcer of other part of left lower 01/31/2019 No Yes leg with fat layer exposed N18.4 Chronic kidney disease, stage 4 (severe) 01/31/2019 No Yes G90.09 Other idiopathic peripheral autonomic neuropathy 01/31/2019 No Yes Inactive Problems Resolved Problems Electronic Signature(s) Signed: 03/19/2019 6:10:19 PM By: Linton Ham MD Entered By: Linton Ham on 03/19/2019 13:32:44 -------------------------------------------------------------------------------- Progress Note Details Patient Name: Date of Service: [name], [name] 03/19/2019 9:30 AM Medical Record [id number] Patient Account Number: [id number] Date of Birth/Sex: Treating RN: 04-10-1940 (78 y.o. M) Primary Care Provider: Garret Reddish Other Clinician: Referring Provider: Treating Provider/Extender:Floetta Brickey, Kandice Hams, Alexis Goodell in Treatment: 6 Subjective History of Present Illness (HPI) 01/31/2019 on evaluation today patient actually appears to be doing poorly  unfortunately secondary to an injury that occurred when he hit his leg on a car door September 9. He was seen in the ER where he had 13 sutures placed. His primary care provider remove those 2 weeks later and unfortunately the wound began to dehisce following. It appears that he may have had a skin flap that just did not take and survive. Nonetheless he was stated to have had Keflex given from the ER though the patient states he does not even remember that. He also is stated to have taken 2 rounds of doxycycline by his primary care provider but again the patient states he only remembers the most recent antibiotic which was actually the Augmentin then switched to Bactrim based on the culture results. The culture showed that he had Morganella Morgagni noted in the culture. Subsequently when he was taking the Bactrim he states that the redness got much better but then unfortunately worsened again once he came off of the medication. Apparently the patient was given triamcinolone ointment to put on the wound although obviously that really did not do much for the wound and again the patient tells me that he was "given the wrong ointment". Again I am not exactly sure what was going on in that scenario 1 where another. Either way I do believe that he has necrotic tissue on the surface of the wound this can require some debridement if he is able to tolerate it and then subsequently he is also get a need likely a refill of the Bactrim in order to help treat the infection I do not believe this is completely cleared up at this point. I also think that he really needs to have edema control again he is very swollen and I believe that Performing a compression wrap will also be of great benefit for him. The patient does have chronic kidney disease stage IV along with peripheral neuropathy secondary to issues with his back. 02/07/2019 on evaluation today patient appears to be doing a little better with regard to the  overall appearance of the wound although it appears to be very  dry. Fortunately there is no signs of active infection at this time. No fever chills noted. I really feel like that the patient may be having issues at this point with the Iodoflex being too absorptive for him at this point which is causing this to dry out. I think we may want to try something different to see if that would be of benefit for him. 02/14/2019 on evaluation today patient actually appears to be doing much better with regard to his wound at this time. He has been tolerating the dressing changes without complication. Fortunately everything appears to be doing much better from the standpoint of how soft the dead tissue is today in fact I was able to debride away some of the necrotic tissue because it was doing so well. Fortunately he is showing signs of good improvement as of this week. 02/21/2019 on evaluation today patient feels like he is actually doing a little bit worse noticing the redness on his lower extremity. I agree I have not seen this before and I am concerned about the possibility of infection. I think we may want to initiate something such as doxycycline and possibly wound culture today to see what exactly may be going on here. 02/28/19 evaluation today patient appears to be doing better with regard to his wound. Heoos been tolerating the dressing changes without complication. Fortunately thereoos no signs of active infection at this time. Overall I feel like the doxycycline has also been helpful for him. 03/07/2019 on evaluation today patient appears to be doing very well with regard to his lower extremity ulcer which is showing excellent improvement. Fortunately there is no evidence of active infection which is good news. No fevers, chills, nausea, vomiting, or diarrhea. Overall I feel like he is making excellent progress towards closure. 12/28; since the patient was last here he fell and apparently fractured  his shoulder in 2 places. He is seeing orthopedics. Kept his wrap on for 2 weeks. He has a venous insufficiency wound on the posterior left calf. We have been using silver collagen under 3 layer compression Objective Constitutional Sitting or standing Blood Pressure is within target range for patient.. Pulse regular and within target range for patient.Marland Kitchen Respirations regular, non-labored and within target range.. Temperature is normal and within the target range for the patient.Marland Kitchen Appears in no distress. Vitals Time Taken: 10:54 AM, Height: 74 in, Weight: 180 lbs, BMI: 23.1, Temperature: 97.6 F, Pulse: 84 bpm, Respiratory Rate: 16 breaths/min, Blood Pressure: 111/70 mmHg. Cardiovascular Pedal pulses are palpable. Edema control is marginal. I wonder if the wrap fell down in the 2-week hiatus. General Notes: Wound exam; I think this shows signs of decent granulation tissue. No debridement is required although I did debride the wound with Anasept and gauze. Has edema around the wound. I wonder if this fell down in a 2-week hiatus Integumentary (Hair, Skin) No erythema around the wound. Changes of chronic venous insufficiency. Wound #1 status is Open. Original cause of wound was Trauma. The wound is located on the Left,Posterior Lower Leg. The wound measures 2.7cm length x 1.7cm width x 0.1cm depth; 3.605cm^2 area and 0.36cm^3 volume. There is Fat Layer (Subcutaneous Tissue) Exposed exposed. There is no tunneling or undermining noted. There is a medium amount of serosanguineous drainage noted. The wound margin is flat and intact. There is large (67-100%) red, pink granulation within the wound bed. There is a small (1-33%) amount of necrotic tissue within the wound bed including Adherent Slough. Assessment Active Problems ICD-10  Venous insufficiency (chronic) (peripheral) Unspecified open wound, left lower leg, initial encounter Non-pressure chronic ulcer of other part of left lower leg with  fat layer exposed Chronic kidney disease, stage 4 (severe) Other idiopathic peripheral autonomic neuropathy Procedures Wound #1 Pre-procedure diagnosis of Wound #1 is a Venous Leg Ulcer located on the Left,Posterior Lower Leg . There was a Three Layer Compression Therapy Procedure by [name], RN. Post procedure Diagnosis Wound #1: Same as Pre-Procedure Plan Follow-up Appointments: Return Appointment in 1 week. - Wednesday with Margarita Grizzle Dressing Change Frequency: Wound #1 Left,Posterior Lower Leg: Do not change entire dressing for one week. Skin Barriers/Peri-Wound Care: Wound #1 Left,Posterior Lower Leg: Moisturizing lotion - to leg Wound Cleansing: Wound #1 Left,Posterior Lower Leg: May shower with protection. Primary Wound Dressing: Wound #1 Left,Posterior Lower Leg: Silver Collagen - moisten with hydrogel Secondary Dressing: Wound #1 Left,Posterior Lower Leg: Dry Gauze ABD pad Edema Control: 3 Layer Compression System - Left Lower Extremity Avoid standing for long periods of time Elevate legs to the level of the heart or above for 30 minutes daily and/or when sitting, a frequency of: - throughout the week Exercise regularly 1. Continue with silver collagen under 3 layer compression. The wound looks stable and dimensions were smaller. No debridement was required Electronic Signature(s) Signed: 03/19/2019 6:10:19 PM By: Linton Ham MD Entered By: Linton Ham on 03/19/2019 13:36:07 -------------------------------------------------------------------------------- SuperBill Details Patient Name: Date of Service: [name], [name] 03/19/2019 Medical Record [id number] Patient Account Number: [id number] Date of Birth/Sex: Treating RN: 09-10-1940 (78 y.o. Janyth Contes Primary Care Provider: Garret Reddish Other Clinician: Referring Provider: Treating Provider/Extender:Kindel Rochefort, Kandice Hams, Alexis Goodell in Treatment: 6 Diagnosis Coding ICD-10  Codes Code Description I87.2 Venous insufficiency (chronic) (peripheral) S81.802A Unspecified open wound, left lower leg, initial encounter L97.822 Non-pressure chronic ulcer of other part of left lower leg with fat layer exposed N18.4 Chronic kidney disease, stage 4 (severe) G90.09 Other idiopathic peripheral autonomic neuropathy Facility Procedures CPT4 Code Description: [id number] (Facility Use Only) [phone] - [facility] L[facility] LT LEG Modifier: Quantity: 1 Physician Procedures CPT4 Code Description: [id number] - WC PHYS LEVEL 3 - EST PT ICD-10 Diagnosis Description L97.822 Non-pressure chronic ulcer of other part of left lower leg wit I87.2 Venous insufficiency (chronic) (peripheral) Modifier: h fat layer expo Quantity: 1 sed Electronic Signature(s) Signed: 03/19/2019 6:10:19 PM By: Linton Ham MD Entered By: Linton Ham on 03/19/2019 13:36:46

## 2019-03-19 NOTE — Progress Notes (Addendum)
BART, Kenneith ([id number]) Visit Report for 03/19/2019 Arrival Information Details Patient Name: Date of Service: WILBORN, MEMBRENO 03/19/2019 9:30 AM Medical Record [id number] Patient Account Number: [id number] Date of Birth/Sex: Treating RN: 07-26-1940 (78 y.o. Jerilynn Mages) Carlene Coria Primary Care [place] Marchiano: Garret Reddish Other Clinician: Referring Nehemie Casserly: Treating Mammie Meras/Extender:Robson, Kandice Hams, Alexis Goodell in Treatment: 6 Visit Information History Since Last Visit All ordered tests and consults were completed: No Patient Arrived: Ambulatory Added or deleted any medications: No Arrival Time: 10:44 Any new allergies or adverse reactions: No Accompanied By: self Had a fall or experienced change in No Transfer Assistance: None activities of daily living that may affect Patient Identification Verified: Yes risk of falls: Secondary Verification Process Completed: Yes Signs or symptoms of abuse/neglect since last No Patient Requires Transmission-Based No visito Precautions: Hospitalized since last visit: No Patient Has Alerts: No Implantable device outside of the clinic excluding No cellular tissue based products placed in the center since last visit: Has Dressing in Place as Prescribed: Yes Has Compression in Place as Prescribed: Yes Pain Present Now: No Electronic Signature(s) Signed: 03/19/2019 5:45:39 PM By: Carlene Coria RN Entered By: Carlene Coria on 03/19/2019 10:54:14 -------------------------------------------------------------------------------- Compression Therapy Details Patient Name: Date of Service: TREYON, WYMORE 03/19/2019 9:30 AM Medical Record [id number] Patient Account Number: [id number] Date of Birth/Sex: Treating RN: 1940/12/05 (78 y.o. Janyth Contes Primary Care Nareg Breighner: Garret Reddish Other Clinician: Referring Eris Breck: Treating Antowan Samford/Extender:Robson, Kandice Hams, Alexis Goodell in Treatment: 6 Compression  Therapy Performed for Wound Wound #1 Left,Posterior Lower Leg Assessment: Performed By: Clinician [name], RN Compression Type: Three Layer Post Procedure Diagnosis Same as Pre-procedure Electronic Signature(s) Signed: 03/19/2019 6:10:06 PM By: [name] RN, BSN Entered By: [name] on 03/19/2019 11:42:14 -------------------------------------------------------------------------------- Encounter Discharge Information Details Patient Name: Date of Service: Dontai, Pember Earlie 03/19/2019 9:30 AM Medical Record [id number] Patient Account Number: [id number] Date of Birth/Sex: Treating RN: [birth year] (78 y.o. [name] Primary Care Yusef Lamp: Garret Reddish Other Clinician: Referring Cornelious Bartolucci: Treating Jemal Miskell/Extender:Robson, Kandice Hams, Alexis Goodell in Treatment: 6 Encounter Discharge Information Items Discharge Condition: Stable Ambulatory Status: Ambulatory Discharge Destination: Home Transportation: Private Auto Accompanied By: self Schedule Follow-up Appointment: Yes Clinical Summary of Care: Electronic Signature(s) Signed: 03/19/2019 5:44:52 PM By: [name] Entered By: [name] on 03/19/2019 11:59:09 -------------------------------------------------------------------------------- Lower Extremity Assessment Details Patient Name: Date of Service: KATO, WIECZOREK 03/19/2019 9:30 AM Medical Record [id number] Patient Account Number: [id number] Date of Birth/Sex: Treating RN: 14-Oct-1940 (78 y.o. Jerilynn Mages) Carlene Coria Primary Care Marshall Kampf: Garret Reddish Other Clinician: Referring Morganna Styles: Treating Giavanni Zeitlin/Extender:Robson, Kandice Hams, Alexis Goodell in Treatment: 6 Edema Assessment Assessed: [Left: No] [Right: No] Edema: [Left: Ye] [Right: s] Calf Left: Right: Point of Measurement: 31 cm From Medial Instep 45 cm cm Ankle Left: Right: Point of Measurement: 12 cm From Medial Instep 26.5 cm cm Electronic  Signature(s) Signed: 03/19/2019 5:45:39 PM By: Carlene Coria RN Entered By: Carlene Coria on 03/19/2019 10:59:28 -------------------------------------------------------------------------------- Multi Wound Chart Details Patient Name: Date of Service: [name], Heinz 03/19/2019 9:30 AM Medical Record [id number] Patient Account Number: [id number] Date of Birth/Sex: Treating RN: 01/23/1941 (78 y.o. M) Primary Care Diamonte Stavely: Garret Reddish Other Clinician: Referring Kadeem Hyle: Treating Latandra Loureiro/Extender:Robson, Kandice Hams, Alexis Goodell in Treatment: 6 Vital Signs Height(in): 74 Pulse(bpm): 86 Weight(lbs): 180 Blood Pressure(mmHg): 111/70 Body Mass Index(BMI): 23 Temperature(F): 97.6 Respiratory 16 Rate(breaths/min): Photos: [1:No Photos] [N/A:N/A] Wound Location: [1:Left Lower Leg - Posterior N/A] Wounding Event: [1:Trauma] [N/A:N/A] Primary Etiology: [1:Venous Leg Ulcer] [N/A:N/A] Comorbid History: [1:Cataracts, Sleep Apnea, Congestive Heart Failure, Peripheral Venous Disease,  Osteoarthritis, Neuropathy] [N/A:N/A] Date Acquired: [1:11/29/2018] [N/A:N/A] Weeks of Treatment: [1:6] [N/A:N/A] Wound Status: [1:Open] [N/A:N/A] Measurements L x W x D 2.7x1.7x0.1 [N/A:N/A] (cm) Area (cm) : [1:3.605] [N/A:N/A] Volume (cm) : [1:0.36] [N/A:N/A] % Reduction in Area: [1:70.70%] [N/A:N/A] % Reduction in Volume: 85.40% [N/A:N/A] Classification: [1:Full Thickness Without Exposed Support Structures] [N/A:N/A] Exudate Amount: [1:Medium] [N/A:N/A] Exudate Type: [1:Serosanguineous] [N/A:N/A] Exudate Color: [1:red, brown] [N/A:N/A] Wound Margin: [1:Flat and Intact] [N/A:N/A] Granulation Amount: [1:Large (67-100%)] [N/A:N/A] Granulation Quality: [1:Red, Pink] [N/A:N/A] Necrotic Amount: [1:Small (1-33%)] [N/A:N/A] Exposed Structures: [1:Fat Layer (Subcutaneous Tissue) Exposed: Yes Fascia: No Tendon: No Muscle: No Joint: No Bone: No] [N/A:N/A] Epithelialization: [1:Small (1-33%)  Compression Therapy] [N/A:N/A N/A] Treatment Notes Wound #1 (Left, Posterior Lower Leg) 1. Cleanse With Wound Cleanser Soap and water 2. Periwound Care Moisturizing lotion 3. Primary Dressing Applied Collegen AG Hydrogel or K-Y Jelly 4. Secondary Dressing ABD Pad Dry Gauze 6. Support Layer Applied 3 layer compression wrap Notes netting. Electronic Signature(s) Signed: 03/19/2019 6:10:19 PM By: Linton Ham MD Entered By: Linton Ham on 03/19/2019 13:32:50 -------------------------------------------------------------------------------- Multi-Disciplinary Care Plan Details Patient Name: Date of Service: LUCCIANO, VITALI 03/19/2019 9:30 AM Medical Record [id number] Patient Account Number: [id number] Date of Birth/Sex: Treating RN: April 05, 1940 (78 y.o. Janyth Contes Primary Care Amaryllis Malmquist: Garret Reddish Other Clinician: Referring Elkin Belfield: Treating Hazelle Woollard/Extender:Robson, Kandice Hams, Alexis Goodell in Treatment: 6 Active Inactive Venous Leg Ulcer Nursing Diagnoses: Knowledge deficit related to disease process and management Potential for venous Insuffiency (use before diagnosis confirmed) Goals: Patient will maintain optimal edema control Date Initiated: 01/31/2019 Target Resolution Date: 03/30/2019 Goal Status: Active Interventions: Assess peripheral edema status every visit. Compression as ordered Treatment Activities: Therapeutic compression applied : 01/31/2019 Notes: Wound/Skin Impairment Nursing Diagnoses: Impaired tissue integrity Knowledge deficit related to ulceration/compromised skin integrity Goals: Patient/caregiver will verbalize understanding of skin care regimen Date Initiated: 01/31/2019 Target Resolution Date: 03/30/2019 Goal Status: Active Ulcer/skin breakdown will have a volume reduction of 30% by week 4 Date Initiated: 01/31/2019 Date Inactivated: 03/07/2019 Target Resolution Date: 02/28/2019 Goal Status: Met Ulcer/skin  breakdown will have a volume reduction of 50% by week 8 Date Initiated: 03/07/2019 Target Resolution Date: 03/30/2019 Goal Status: Active Interventions: Assess patient/caregiver ability to obtain necessary supplies Assess patient/caregiver ability to perform ulcer/skin care regimen upon admission and as needed Assess ulceration(s) every visit Treatment Activities: Skin care regimen initiated : 01/31/2019 Topical wound management initiated : 01/31/2019 Notes: Electronic Signature(s) Signed: 03/19/2019 6:10:06 PM By: [name] RN, BSN Entered By: [name] on 03/19/2019 11:41:07 -------------------------------------------------------------------------------- Pain Assessment Details Patient Name: Date of Service: Braylee, Bosher Roodhouse 03/19/2019 9:30 AM Medical Record [id number] Patient Account Number: [id number] Date of Birth/Sex: Treating RN: 09-11-1940 (78 y.o. Jerilynn Mages) Carlene Coria Primary Care Hiroyuki Ozanich: Garret Reddish Other Clinician: Referring Kamylle Axelson: Treating Kushi Kun/Extender:Robson, Kandice Hams, Alexis Goodell in Treatment: 6 Active Problems Location of Pain Severity and Description of Pain Patient Has Paino No Site Locations Pain Management and Medication Current Pain Management: Electronic Signature(s) Signed: 03/19/2019 5:45:39 PM By: Carlene Coria RN Entered By: Carlene Coria on 03/19/2019 10:54:50 -------------------------------------------------------------------------------- Patient/Caregiver Education Details Patient Name: [name], Tajay 12/28/2020andnbsp9:30 Date of Service: AM Medical Record [id number] Number: Patient Account Number: [id number] Treating RN: 04-09-1940 (78 y.o. [name] Date of Birth/Gender: M) Other Clinician: Primary Care Physician: Garret Reddish Treating Linton Ham Referring Physician: Physician/Extender: Kaylyn Layer in Treatment: 6 Education Assessment Education Provided To: Patient Education  Topics Provided Wound/Skin Impairment: Methods: Explain/Verbal Responses: State content correctly Electronic Signature(s) Signed: 03/19/2019 6:10:06 PM By: [name] RN, BSN Entered  By: [name] on 03/19/2019 11:41:55 -------------------------------------------------------------------------------- Wound Assessment Details Patient Name: Date of Service: DEBORAH, DONDERO 03/19/2019 9:30 AM Medical Record [id number] Patient Account Number: [id number] Date of Birth/Sex: Treating RN: 12-29-1940 (78 y.o. Jerilynn Mages) Carlene Coria Primary Care Ayodeji Keimig: Garret Reddish Other Clinician: Referring Lisel Siegrist: Treating Ky Moskowitz/Extender:Robson, Kandice Hams, Alexis Goodell in Treatment: 6 Wound Status Wound Number: 1 Primary Venous Leg Ulcer Etiology: Wound Location: Left Lower Leg - Posterior Wound Open Wounding Event: Trauma Status: Date Acquired: 11/29/2018 Comorbid Cataracts, Sleep Apnea, Congestive Heart Weeks Of Treatment: 6 History: Failure, Peripheral Venous Disease, Clustered Wound: No Osteoarthritis, Neuropathy Photos Wound Measurements Length: (cm) 2.7 % Reduct Width: (cm) 1.7 % Reduct Depth: (cm) 0.1 Epitheli Area: (cm) 3.605 Tunneli Volume: (cm) 0.36 Undermi Wound Description Classification: Full Thickness Without Exposed Support Foul Od Structures Slough/ Wound Wound Flat and Intact Margin: Exudate Medium Amount: Exudate Serosanguineous Type: Exudate red, brown Color: Wound Bed Granulation Amount: Large (67-100%) Granulation Quality: Red, Pink Fascia E Necrotic Amount: Small (1-33%) Fat Laye Necrotic Quality: Adherent Slough Tendon E Muscle E Joint Ex Bone Exp or After Cleansing: No Fibrino Yes Exposed Structure xposed: No r (Subcutaneous Tissue) Exposed: Yes xposed: No xposed: No posed: No osed: No ion in Area: 70.7% ion in Volume: 85.4% alization: Small (1-33%) ng: No ning: No Treatment Notes Wound #1 (Left, Posterior Lower  Leg) 1. Cleanse With Wound Cleanser Soap and water 2. Periwound Care Moisturizing lotion 3. Primary Dressing Applied Collegen AG Hydrogel or K-Y Jelly 4. Secondary Dressing ABD Pad Dry Gauze 6. Support Layer Applied 3 layer compression wrap Notes netting. Electronic Signature(s) Signed: 03/20/2019 3:43:24 PM By: Mikeal Hawthorne EMT/HBOT Signed: 03/20/2019 5:43:27 PM By: Carlene Coria RN Previous Signature: 03/19/2019 5:45:39 PM Version By: Carlene Coria RN Entered By: Mikeal Hawthorne on 03/20/2019 14:48:09 -------------------------------------------------------------------------------- Vitals Details Patient Name: Date of Service: [name], Izac 03/19/2019 9:30 AM Medical Record [id number] Patient Account Number: [id number] Date of Birth/Sex: Treating RN: 1940-09-01 (78 y.o. Jerilynn Mages) Carlene Coria Primary Care Gimena Buick: Garret Reddish Other Clinician: Referring Appollonia Klee: Treating Lindsay Soulliere/Extender:Robson, Kandice Hams, Alexis Goodell in Treatment: 6 Vital Signs Time Taken: 10:54 Temperature (F): 97.6 Height (in): 74 Pulse (bpm): 84 Weight (lbs): 180 Respiratory Rate (breaths/min): 16 Body Mass Index (BMI): 23.1 Blood Pressure (mmHg): 111/70 Reference Range: 80 - 120 mg / dl Electronic Signature(s) Signed: 03/19/2019 5:45:39 PM By: Carlene Coria RN Entered By: Carlene Coria on 03/19/2019 10:54:43

## 2019-03-20 ENCOUNTER — Other Ambulatory Visit: Payer: Self-pay

## 2019-03-24 ENCOUNTER — Emergency Department (HOSPITAL_BASED_OUTPATIENT_CLINIC_OR_DEPARTMENT_OTHER)
Admit: 2019-03-24 | Discharge: 2019-03-24 | Disposition: A | Discharge status: Home / Self Care | Payer: Medicare Other | Attending: Emergency Medicine | Admitting: Emergency Medicine

## 2019-03-24 ENCOUNTER — Encounter (HOSPITAL_COMMUNITY): Payer: Self-pay

## 2019-03-24 ENCOUNTER — Inpatient Hospital Stay (HOSPITAL_COMMUNITY)
Admission: EM | Admit: 2019-03-24 | Discharge: 2019-03-28 | DRG: 291 | Disposition: A | Discharge status: Home Health | Payer: Medicare Other | Attending: Internal Medicine | Admitting: Internal Medicine

## 2019-03-24 ENCOUNTER — Emergency Department (HOSPITAL_COMMUNITY): Payer: Medicare Other

## 2019-03-24 ENCOUNTER — Other Ambulatory Visit: Payer: Self-pay

## 2019-03-24 DIAGNOSIS — G4733 Obstructive sleep apnea (adult) (pediatric): Secondary | ICD-10-CM | POA: Diagnosis not present

## 2019-03-24 DIAGNOSIS — R5381 Other malaise: Secondary | ICD-10-CM | POA: Diagnosis not present

## 2019-03-24 DIAGNOSIS — M1990 Unspecified osteoarthritis, unspecified site: Secondary | ICD-10-CM | POA: Diagnosis not present

## 2019-03-24 DIAGNOSIS — S42202D Unspecified fracture of upper end of left humerus, subsequent encounter for fracture with routine healing: Secondary | ICD-10-CM | POA: Diagnosis not present

## 2019-03-24 DIAGNOSIS — D631 Anemia in chronic kidney disease: Secondary | ICD-10-CM | POA: Diagnosis present

## 2019-03-24 DIAGNOSIS — R0609 Other forms of dyspnea: Secondary | ICD-10-CM | POA: Diagnosis not present

## 2019-03-24 DIAGNOSIS — S42202A Unspecified fracture of upper end of left humerus, initial encounter for closed fracture: Secondary | ICD-10-CM | POA: Diagnosis present

## 2019-03-24 DIAGNOSIS — M810 Age-related osteoporosis without current pathological fracture: Secondary | ICD-10-CM | POA: Diagnosis not present

## 2019-03-24 DIAGNOSIS — N184 Chronic kidney disease, stage 4 (severe): Secondary | ICD-10-CM | POA: Diagnosis present

## 2019-03-24 DIAGNOSIS — E039 Hypothyroidism, unspecified: Secondary | ICD-10-CM | POA: Diagnosis not present

## 2019-03-24 DIAGNOSIS — N3941 Urge incontinence: Secondary | ICD-10-CM | POA: Diagnosis present

## 2019-03-24 DIAGNOSIS — N401 Enlarged prostate with lower urinary tract symptoms: Secondary | ICD-10-CM | POA: Diagnosis present

## 2019-03-24 DIAGNOSIS — I130 Hypertensive heart and chronic kidney disease with heart failure and stage 1 through stage 4 chronic kidney disease, or unspecified chronic kidney disease: Principal | ICD-10-CM | POA: Diagnosis present

## 2019-03-24 DIAGNOSIS — Z9114 Patient's other noncompliance with medication regimen: Secondary | ICD-10-CM

## 2019-03-24 DIAGNOSIS — R0602 Shortness of breath: Secondary | ICD-10-CM | POA: Diagnosis not present

## 2019-03-24 DIAGNOSIS — R252 Cramp and spasm: Secondary | ICD-10-CM | POA: Diagnosis present

## 2019-03-24 DIAGNOSIS — S42209A Unspecified fracture of upper end of unspecified humerus, initial encounter for closed fracture: Secondary | ICD-10-CM | POA: Diagnosis present

## 2019-03-24 DIAGNOSIS — R601 Generalized edema: Secondary | ICD-10-CM

## 2019-03-24 DIAGNOSIS — W19XXXA Unspecified fall, initial encounter: Secondary | ICD-10-CM | POA: Diagnosis present

## 2019-03-24 DIAGNOSIS — S81802D Unspecified open wound, left lower leg, subsequent encounter: Secondary | ICD-10-CM | POA: Diagnosis not present

## 2019-03-24 DIAGNOSIS — I5033 Acute on chronic diastolic (congestive) heart failure: Secondary | ICD-10-CM | POA: Diagnosis not present

## 2019-03-24 DIAGNOSIS — R519 Headache, unspecified: Secondary | ICD-10-CM | POA: Diagnosis present

## 2019-03-24 DIAGNOSIS — F3131 Bipolar disorder, current episode depressed, mild: Secondary | ICD-10-CM | POA: Diagnosis present

## 2019-03-24 DIAGNOSIS — R531 Weakness: Secondary | ICD-10-CM | POA: Diagnosis not present

## 2019-03-24 DIAGNOSIS — Z8719 Personal history of other diseases of the digestive system: Secondary | ICD-10-CM

## 2019-03-24 DIAGNOSIS — S81802A Unspecified open wound, left lower leg, initial encounter: Secondary | ICD-10-CM | POA: Diagnosis present

## 2019-03-24 DIAGNOSIS — Z823 Family history of stroke: Secondary | ICD-10-CM

## 2019-03-24 DIAGNOSIS — Z96651 Presence of right artificial knee joint: Secondary | ICD-10-CM | POA: Diagnosis not present

## 2019-03-24 DIAGNOSIS — I509 Heart failure, unspecified: Secondary | ICD-10-CM

## 2019-03-24 DIAGNOSIS — M7989 Other specified soft tissue disorders: Secondary | ICD-10-CM

## 2019-03-24 DIAGNOSIS — Z20822 Contact with and (suspected) exposure to covid-19: Secondary | ICD-10-CM | POA: Diagnosis not present

## 2019-03-24 DIAGNOSIS — Z79899 Other long term (current) drug therapy: Secondary | ICD-10-CM

## 2019-03-24 DIAGNOSIS — I5031 Acute diastolic (congestive) heart failure: Secondary | ICD-10-CM | POA: Diagnosis not present

## 2019-03-24 DIAGNOSIS — R269 Unspecified abnormalities of gait and mobility: Secondary | ICD-10-CM | POA: Diagnosis not present

## 2019-03-24 DIAGNOSIS — S42309A Unspecified fracture of shaft of humerus, unspecified arm, initial encounter for closed fracture: Secondary | ICD-10-CM

## 2019-03-24 DIAGNOSIS — S42302D Unspecified fracture of shaft of humerus, left arm, subsequent encounter for fracture with routine healing: Secondary | ICD-10-CM | POA: Diagnosis not present

## 2019-03-24 DIAGNOSIS — Z8042 Family history of malignant neoplasm of prostate: Secondary | ICD-10-CM

## 2019-03-24 DIAGNOSIS — S42292A Other displaced fracture of upper end of left humerus, initial encounter for closed fracture: Secondary | ICD-10-CM | POA: Diagnosis not present

## 2019-03-24 LAB — CBC WITH DIFFERENTIAL/PLATELET
Abs Immature Granulocytes: 0.03 10*3/uL (ref 0.00–0.07)
Basophils Absolute: 0 10*3/uL (ref 0.0–0.1)
Basophils Relative: 0 %
Eosinophils Absolute: 0.4 10*3/uL (ref 0.0–0.5)
Eosinophils Relative: 6 %
HCT: 30.5 % — ABNORMAL LOW (ref 39.0–52.0)
Hemoglobin: 9.5 g/dL — ABNORMAL LOW (ref 13.0–17.0)
Immature Granulocytes: 0 %
Lymphocytes Relative: 11 %
Lymphs Abs: 0.8 10*3/uL (ref 0.7–4.0)
MCH: 31.8 pg (ref 26.0–34.0)
MCHC: 31.1 g/dL (ref 30.0–36.0)
MCV: 102 fL — ABNORMAL HIGH (ref 80.0–100.0)
Monocytes Absolute: 1 10*3/uL (ref 0.1–1.0)
Monocytes Relative: 14 %
Neutro Abs: 4.8 10*3/uL (ref 1.7–7.7)
Neutrophils Relative %: 69 %
Platelets: 214 10*3/uL (ref 150–400)
RBC: 2.99 MIL/uL — ABNORMAL LOW (ref 4.22–5.81)
RDW: 13.1 % (ref 11.5–15.5)
WBC: 7 10*3/uL (ref 4.0–10.5)
nRBC: 0 % (ref 0.0–0.2)

## 2019-03-24 LAB — COMPREHENSIVE METABOLIC PANEL
ALT: 15 U/L (ref 0–44)
AST: 22 U/L (ref 15–41)
Albumin: 3.4 g/dL — ABNORMAL LOW (ref 3.5–5.0)
Alkaline Phosphatase: 83 U/L (ref 38–126)
Anion gap: 10 (ref 5–15)
BUN: 45 mg/dL — ABNORMAL HIGH (ref 8–23)
CO2: 23 mmol/L (ref 22–32)
Calcium: 9.6 mg/dL (ref 8.9–10.3)
Chloride: 110 mmol/L (ref 98–111)
Creatinine, Ser: 2.3 mg/dL — ABNORMAL HIGH (ref 0.61–1.24)
GFR calc Af Amer: 30 mL/min — ABNORMAL LOW (ref >60)
GFR calc non Af Amer: 26 mL/min — ABNORMAL LOW (ref >60)
Glucose, Bld: 88 mg/dL (ref 70–99)
Potassium: 4 mmol/L (ref 3.5–5.1)
Sodium: 143 mmol/L (ref 135–145)
Total Bilirubin: 0.6 mg/dL (ref 0.3–1.2)
Total Protein: 6.4 g/dL — ABNORMAL LOW (ref 6.5–8.1)

## 2019-03-24 LAB — BRAIN NATRIURETIC PEPTIDE: B Natriuretic Peptide: 46 pg/mL (ref 0.0–100.0)

## 2019-03-24 LAB — POC SARS CORONAVIRUS 2 AG -  ED: SARS Coronavirus 2 Ag: NEGATIVE

## 2019-03-24 LAB — TROPONIN I (HIGH SENSITIVITY)
Troponin I (High Sensitivity): 10 ng/L (ref <18)
Troponin I (High Sensitivity): 9 ng/L (ref <18)

## 2019-03-24 MED ORDER — L-METHYLFOLATE-B6-B12 3-35-2 MG PO TABS
1.0000 | ORAL_TABLET | Freq: Every day | ORAL | Status: DC
  Administered 2019-03-25 – 2019-03-28 (×4): 1 via ORAL
  Filled 2019-03-24 (×4): qty 1

## 2019-03-24 MED ORDER — ARIPIPRAZOLE 5 MG PO TABS
2.5000 mg | ORAL_TABLET | Freq: Every day | ORAL | Status: DC
  Administered 2019-03-25 – 2019-03-28 (×4): 2.5 mg via ORAL
  Filled 2019-03-24 (×4): qty 1

## 2019-03-24 MED ORDER — FUROSEMIDE 10 MG/ML IJ SOLN
40.0000 mg | Freq: Every day | INTRAMUSCULAR | Status: DC
  Administered 2019-03-25: 40 mg via INTRAVENOUS
  Filled 2019-03-24: qty 4

## 2019-03-24 MED ORDER — FUROSEMIDE 10 MG/ML IJ SOLN
40.0000 mg | Freq: Once | INTRAMUSCULAR | Status: AC
  Administered 2019-03-24: 40 mg via INTRAVENOUS
  Filled 2019-03-24: qty 4

## 2019-03-24 MED ORDER — TRANYLCYPROMINE SULFATE 10 MG PO TABS
30.0000 mg | ORAL_TABLET | Freq: Two times a day (BID) | ORAL | Status: DC
  Administered 2019-03-24 – 2019-03-28 (×8): 30 mg via ORAL
  Filled 2019-03-24 (×9): qty 3

## 2019-03-24 MED ORDER — ENOXAPARIN SODIUM 30 MG/0.3ML ~~LOC~~ SOLN
30.0000 mg | SUBCUTANEOUS | Status: DC
  Administered 2019-03-24 – 2019-03-27 (×4): 30 mg via SUBCUTANEOUS
  Filled 2019-03-24 (×4): qty 0.3

## 2019-03-24 MED ORDER — ACETAMINOPHEN 325 MG PO TABS
650.0000 mg | ORAL_TABLET | Freq: Four times a day (QID) | ORAL | Status: DC | PRN
  Administered 2019-03-24 – 2019-03-28 (×10): 650 mg via ORAL
  Filled 2019-03-24 (×10): qty 2

## 2019-03-24 NOTE — ED Provider Notes (Signed)
Emergency Department Provider Note   I have reviewed the triage vital signs and the nursing notes.   HISTORY  Chief Complaint Shortness of Breath   HPI [name] is a 79 y.o. male with PMH of dCHF, CKD, HTN, chronic left leg wound from September 2020, and left shoulder fracture followed by Dr. Tamera Punt with [facility] presents to the emergency department evaluation of shortness of breath symptoms worsening over the past 7-10 days.  Patient denies any associated chest pain, fever, chills, COVID-19 exposure.  Patient states he has been to be taking Lasix but stopped taking approximately 1 week ago because it caused him to urinate frequently and had several episodes of urine incontinence.  He tells me that his legs are swollen more than normal.  He has swelling in his left arm which he says is "about the same" since breaking his arm and not significantly worsening. No history of DVT/PE. He has not been using the sling provided after breaking the shoulder but does continue to follow with ortho. No planned surgical intervention at this time per patient's understanding.   Past Medical History:  Diagnosis Date  . Arthritis    back  . Back pain    reason unknown  . Benign prostatic hyperplasia (BPH) with urinary urge incontinence    sees Dr. Jeffie Pollock   . Bipolar 1 disorder [facility])    sees Dr. Norma Fredrickson  . Chronic diastolic CHF (congestive heart failure) ([place]) 11/30/2017   right Heart cath in [facility] 10/2017 showed normal filling pressures. 2D echo with EF 55-60%, grade 1 DD, MIld RVE with normal RVF (normal RV size with followup echo 09/09/2017), trivial MR/PR, mild TR  . CKD (chronic kidney disease)    stage 3 followed by [facility]  . Depression    takes Parnate daily  . Diverticulitis of colon 05/27/2015  . Essential hypertension 04/11/2007  . Headache(784.0)    occasionally  . Hip fracture ([place]) 09/01/2017   left hip fracture  . History of colon polyps   .  Hypothyroidism 10/03/2009   Noted by Dr. Arnoldo Morale- patient states never on medicine- may have been checked due to parnate and abilify   . 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 Joint pain   . Joint swelling   . Osteoporosis    takes Drisdol weekly and Caltrate daily  . Peripheral edema    takes torsemide daily followed by nephrology  . Shingles 05/27/2015  . Sleep apnea    wears c-pap    Patient Active Problem List   Diagnosis Date Noted  . Acute CHF (congestive heart failure) ([place]) 03/24/2019  . Paroxysmal tachycardia ([place]) 12/05/2018  . Spondylolisthesis of lumbar region 03/10/2018  . Chronic diastolic CHF (congestive heart failure) ([place]) 11/30/2017  . Leukocytosis 09/04/2017  . History of fracture of left hip 09/01/2017  . Senile purpura ([place]) 06/22/2017  . Osteoarthritis of spine 03/21/2017  . Chronic low back pain with degenerative lumbar spinal stenosis-minimal improvement with surgery late 2019 03/08/2017  . Hypersomnolence 08/20/2016  . Gout 07/07/2016  . Other constipation 01/13/2016  . Primary osteoarthritis of right knee 12/08/2015  . Fatty liver 10/09/2014  . Gallstones 10/09/2014  . Bilateral lower extremity edema 09/25/2014  . Chronic venous insufficiency 08/29/2013  . Right shoulder pain 08/29/2013  . Neuropathy 01/20/2011  . OSA (obstructive sleep apnea) 10/30/2008  . Osteoporosis 09/25/2008  . CKD (chronic kidney disease), stage IV ([place]) 03/01/2008  .  Mild depressed bipolar I disorder ([place]) 09/21/2006    Past Surgical History:  Procedure Laterality Date  . BACK SURGERY    . CARDIAC CATHETERIZATION    . COLONOSCOPY W/ POLYPECTOMY  03-10-15   per Dr. Havery Moros, adenomatous polyps, repeat in 3 yrs   . HIP FRACTURE SURGERY Left 09/02/2017  . REVERSE SHOULDER ARTHROPLASTY Right 05/22/2013   Procedure: REVERSE SHOULDER ARTHROPLASTY;  Surgeon: Nita Sells, MD;  Location: [place];  Service:  Orthopedics;  Laterality: Right;  Right reverse total shoulder  . TONSILLECTOMY    . TOTAL KNEE ARTHROPLASTY Right 12/08/2015   Procedure: TOTAL KNEE ARTHROPLASTY;  Surgeon: Dorna Leitz, MD;  Location: [place];  Service: Orthopedics;  Laterality: Right;    Allergies Patient has no known allergies.  Family History  Problem Relation Age of Onset  . Cancer Mother        unknown type  . Stroke Father   . Cancer Maternal Grandmother   . Appendicitis Paternal Grandfather   . Prostate cancer Brother   . Colon cancer Neg Hx   . Esophageal cancer Neg Hx   . Rectal cancer Neg Hx   . Stomach cancer Neg Hx   . Colon polyps Neg Hx     Social History Social History   Tobacco Use  . Smoking status: Never Smoker  . Smokeless tobacco: Never Used  Substance Use Topics  . Alcohol use: No    Alcohol/week: 0.0 standard drinks  . Drug use: No    Review of Systems  Constitutional: No fever/chills Eyes: No visual changes. ENT: No sore throat. Cardiovascular: Denies chest pain. Positive left arm and bilateral LE swelling.  Respiratory: Positive shortness of breath. Gastrointestinal: No abdominal pain.  No nausea, no vomiting.  No diarrhea.  No constipation. Genitourinary: Negative for dysuria. Musculoskeletal: Negative for back pain. Skin: Negative for rash. Neurological: Negative for headaches, focal weakness or numbness.  10-point ROS otherwise negative.  ____________________________________________   PHYSICAL EXAM:  VITAL SIGNS: ED Triage Vitals  Enc Vitals Group     BP 03/24/19 1425 (!) 145/77     Pulse Rate 03/24/19 1425 80     Resp --      Temp 03/24/19 1425 97.7 F (36.5 C)     Temp Source 03/24/19 1425 Oral     SpO2 03/24/19 1425 96 %   Constitutional: Alert and oriented. Well appearing and in no acute distress. Eyes: Conjunctivae are normal.  Head: Atraumatic. Nose: No congestion/rhinnorhea. Mouth/Throat: Mucous membranes are moist.  Neck: No stridor.   Cardiovascular: Normal rate, regular rhythm. Good peripheral circulation. Grossly normal heart sounds.   Respiratory: Normal respiratory effort.  No retractions. Lungs with faint crackles at the bases.  Gastrointestinal: Soft and nontender. No distention.  Musculoskeletal: No gross deformities of extremities. Pitting edema throughout the LUE and 3+ pitting edema in the bilateral LEs.  Neurologic:  Normal speech and language. No gross focal neurologic deficits are appreciated.  Skin:  Skin is warm, dry and intact. No rash noted.   ____________________________________________   LABS (all labs ordered are listed, but only abnormal results are displayed)  Labs Reviewed  COMPREHENSIVE METABOLIC PANEL - Abnormal; Notable for the following components:      Result Value   BUN 45 (*)    Creatinine, Ser 2.30 (*)    Total Protein 6.4 (*)    Albumin 3.4 (*)    GFR calc non Af Amer 26 (*)    GFR calc Af Amer 30 (*)  All other components within normal limits  CBC WITH DIFFERENTIAL/PLATELET - Abnormal; Notable for the following components:   RBC 2.99 (*)    Hemoglobin 9.5 (*)    HCT 30.5 (*)    MCV 102.0 (*)    All other components within normal limits  SARS CORONAVIRUS 2 (TAT 6-24 HRS)  BRAIN NATRIURETIC PEPTIDE  BASIC METABOLIC PANEL  CBC  POC SARS CORONAVIRUS 2 AG -  ED  TROPONIN I (HIGH SENSITIVITY)  TROPONIN I (HIGH SENSITIVITY)   ____________________________________________  EKG   EKG Interpretation  Date/Time:  Saturday March 24 2019 13:58:22 EST Ventricular Rate:  111 PR Interval:    QRS Duration: 113 QT Interval:  337 QTC Calculation: 426 R Axis:   37 Text Interpretation: Sinus tachycardia Atrial premature complexes Borderline intraventricular conduction delay Low voltage, precordial leads Borderline repolarization abnormality Artifact in lead(s) I II III aVR aVL aVF and baseline wander in lead(s) V2 No clear STEMI. Artifact affecting read. Confirmed by Nanda Quinton  [phone]) on 03/24/2019 2:36:56 PM       ____________________________________________  RADIOLOGY  DG Chest Portable 1 View  Result Date: 03/24/2019 CLINICAL DATA:  Shortness of breath EXAM: PORTABLE CHEST 1 VIEW COMPARISON:  02/05/2019 FINDINGS: Cardiac shadows within normal limits. The lungs are well aerated bilaterally. No focal infiltrate or sizable effusion is seen. Calcified granuloma is noted in the left mid lung. Right shoulder replacement is noted. IMPRESSION: No active disease. Electronically Signed   By: Inez Catalina M.D.   On: 03/24/2019 14:40   UE VENOUS DUPLEX ([facility] & [facility] 7 am - 7 pm)  Result Date: 03/24/2019 UPPER VENOUS STUDY  Indications: Swelling Risk Factors: Trauma Fracture. Comparison Study: No prior studies. Performing Technologist: Oliver Hum RVT  Examination Guidelines: A complete evaluation includes B-mode imaging, spectral Doppler, color Doppler, and power Doppler as needed of all accessible portions of each vessel. Bilateral testing is considered an integral part of a complete examination. Limited examinations for reoccurring indications may be performed as noted.  Right Findings: +----------+------------+---------+-----------+----------+-------+ RIGHT     CompressiblePhasicitySpontaneousPropertiesSummary +----------+------------+---------+-----------+----------+-------+ Subclavian    Full       Yes       Yes                      +----------+------------+---------+-----------+----------+-------+  Left Findings: +----------+------------+---------+-----------+----------+-------+ LEFT      CompressiblePhasicitySpontaneousPropertiesSummary +----------+------------+---------+-----------+----------+-------+ IJV           Full       Yes       Yes                      +----------+------------+---------+-----------+----------+-------+ Subclavian    Full       Yes       Yes                       +----------+------------+---------+-----------+----------+-------+ Axillary      Full       Yes       Yes                      +----------+------------+---------+-----------+----------+-------+ Brachial      Full       Yes       Yes                      +----------+------------+---------+-----------+----------+-------+ Radial        Full                                          +----------+------------+---------+-----------+----------+-------+  Ulnar         Full                                          +----------+------------+---------+-----------+----------+-------+ Cephalic      Full                                          +----------+------------+---------+-----------+----------+-------+ Basilic       Full                                          +----------+------------+---------+-----------+----------+-------+  Summary:  Right: No evidence of thrombosis in the subclavian.  Left: No evidence of deep vein thrombosis in the upper extremity. No evidence of superficial vein thrombosis in the upper extremity.  *See table(s) above for measurements and observations.  Diagnosing physician: Deitra Mayo MD Electronically signed by Deitra Mayo MD on 03/24/2019 at 6:42:29 PM.    Final     ____________________________________________   PROCEDURES  Procedure(s) performed:   Procedures  None ____________________________________________   INITIAL IMPRESSION / ASSESSMENT AND PLAN / ED COURSE  Pertinent labs & imaging results that were available during my care of the patient were reviewed by me and considered in my medical decision making (see chart for details).   Patient presents to the ED with increased SOB symptoms. No fever. Appears volume up clinically. CXR without acute findings. Labs pending. Care transferred to Dr. Ralene Bathe.    ____________________________________________  FINAL CLINICAL IMPRESSION(S) / ED DIAGNOSES  Final diagnoses:   Generalized edema  DOE (dyspnea on exertion)     MEDICATIONS GIVEN DURING THIS VISIT:  Medications  enoxaparin (LOVENOX) injection 30 mg (has no administration in time range)  furosemide (LASIX) injection 40 mg (40 mg Intravenous Given 03/24/19 1751)    Note:  This document was prepared using Dragon voice recognition software and may include unintentional dictation errors.  Nanda Quinton, MD, [facility] Emergency Medicine    Atthew Coutant, Wonda Old[facility], MD 03/24/19 2020

## 2019-03-24 NOTE — Progress Notes (Signed)
Orthopedic Tech Progress Note Patient Details:  [name] [birth year] [id number]  Ortho Devices Type of Ortho Device: Sling arm elevator Ortho Device/Splint Location: LUE Ortho Device/Splint Interventions: Ordered, Application, Adjustment   Post Interventions Patient Tolerated: Well Instructions Provided: Care of device   Staci Righter 03/24/2019, 10:31 PM

## 2019-03-24 NOTE — Progress Notes (Signed)
Left upper extremity venous duplex has been completed. Preliminary results can be found in CV Proc through chart review.  Results were given to Dr. Ayesha Rumpf.  03/24/19 5:12 PM [name] RVT

## 2019-03-24 NOTE — Progress Notes (Signed)
Orthopedic Tech Progress Note Patient Details:  [name] Jul 13, 1940 [id number] Patient tolerated with arm barely off the bed. Could not handle complete elevation due to pain.      Post Interventions Patient Tolerated: Well Instructions Provided: Care of device  Patient ID: [name], male   DOB: February 26, 1941, 79 y.o.   MRN: [id number]   Staci Righter 03/24/2019, 10:35 PM

## 2019-03-24 NOTE — ED Provider Notes (Signed)
  Physical Exam  BP 121/78   Pulse 87   Temp 97.7 F (36.5 C) (Oral)   Resp 17   SpO2 94%   Physical Exam  ED Course/Procedures     Procedures  MDM  Patient care assumed at 1500. Patient presents for evaluation of shortness of breath over the last two weeks as well as increase edema. He did stop taking his Lasix due to frequent urination. He is significantly edematous on examination but in no respiratory distress. Labs pending.   DVT study is negative for acute DVT. Labs are near his baseline. Patient is unable to ambulate without significant dyspnea. He requires two person assist due to deconditioning.    Medicine consulted for admission due to significant edema and inability to ambulate. Patient updated findings of studies and he is in agreement with admission.    Quintella Reichert, MD 03/25/19 0000

## 2019-03-24 NOTE — H&P (Signed)
History and Physical    [name]:[id number] DOB: 01/21/1941 DOA: 03/24/2019  PCP: [name], MD  Patient coming from: Home, lives with a friend  I have personally briefly reviewed patient's old medical records in [place]  Chief Complaint: increasing shortness of breath  HPI: [name] is a 79 y.o. male with medical history significant of  Diastolic congestive heart failure, chronic kidney disease stage IV, OSA on CPAP, bipolar disorder type I, and osteoporosis who presents with concerns of increasing shortness of breath. Patient fell on about 12/22 and suffered a fracture to his left proximal humerus. Since then he has stopped taking his torsemide due to increased urination and difficulty with changing his adult diaper.  He has since noticed dyspnea with rest and with exertion for the past week..  Also notes increased lower extremity edema.  He has seen orthopedic for his left humerus fracture and per his understanding there is no operative treatment.  He is supposed to wear sling but states that it is not working so has not been using it. Reports that his pain currently is a 10 out of 10 and he has been taking Tylenol 4 times a day.  States he tried oxycodone for about 2 days but felt like it made him feel short of breath.  He does not want any type of opioids or codeine.  He denies any fevers, cough or chest pain.  No nausea, vomiting or diarrhea.  No decreased appetite.  ED Course: He was afebrile, normotensive on room air.  WBC of 7.0, hemoglobin of 9.5 compared with 11 ten days ago. Sodium of 143, K pf 4. Creatinine of 2.30 with baseline around 2.5.  Troponin of 10 and 9. BNP of 46. EKG with significant baseline artifact but no significant ST or T wave changes. Negative venous ultrasound of left LE for DVT.  Negative POC COVID test.  CXR negative.  Review of Systems:  Constitutional: No Weight Change, No Fever ENT/Mouth: No sore throat, No  Rhinorrhea Eyes:  No Vision Changes Cardiovascular: No Chest Pain, + SOB, +PND, + Dyspnea on Exertion, + Orthopnea, + Edema, No Palpitations Respiratory: No Cough, No Sputum, Gastrointestinal: No Nausea, No Vomiting, No Diarrhea,No Pain Genitourinary: no Urinary Incontinence Musculoskeletal: + Arthralgias, + Myalgias Skin: No Skin Lesions, No Pruritus Neuro: no Weakness, No Numbness Psych: No Anxiety/Panic, No Depression, no decrease appetite Heme/Lymph: No Bruising, No Bleeding  Past Medical History:  Diagnosis Date  . Arthritis    back  . Back pain    reason unknown  . Benign prostatic hyperplasia (BPH) with urinary urge incontinence    sees Dr. Jeffie Pollock   . Bipolar 1 disorder [facility])    sees Dr. Norma Fredrickson  . Chronic diastolic CHF (congestive heart failure) ([place]) 11/30/2017   right Heart [facility] 10/2017 showed normal filling pressures. 2D echo with EF 55-60%, grade 1 DD, MIld RVE with normal RVF (normal RV size with followup echo 09/09/2017), trivial MR/PR, mild TR  . CKD (chronic kidney disease)    stage 3 followed by [facility]  . Depression    takes Parnate daily  . Diverticulitis of colon 05/27/2015  . Essential hypertension 04/11/2007  . Headache(784.0)    occasionally  . Hip fracture ([place]) 09/01/2017   left hip fracture  . History of colon polyps   . Hypothyroidism 10/03/2009   Noted by Dr. Arnoldo Morale- patient states never on medicine- may have been checked due to parnate and abilify   .  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 Joint pain   . Joint swelling   . Osteoporosis    takes Drisdol weekly and Caltrate daily  . Peripheral edema    takes torsemide daily followed by nephrology  . Shingles 05/27/2015  . Sleep apnea    wears c-pap    Past Surgical History:  Procedure Laterality Date  . BACK SURGERY    . CARDIAC CATHETERIZATION    . COLONOSCOPY W/ POLYPECTOMY  03-10-15   per Dr.  Havery Moros, adenomatous polyps, repeat in 3 yrs   . HIP FRACTURE SURGERY Left 09/02/2017  . REVERSE SHOULDER ARTHROPLASTY Right 05/22/2013   Procedure: REVERSE SHOULDER ARTHROPLASTY;  Surgeon: Nita Sells, MD;  Location: [place];  Service: Orthopedics;  Laterality: Right;  Right reverse total shoulder  . TONSILLECTOMY    . TOTAL KNEE ARTHROPLASTY Right 12/08/2015   Procedure: TOTAL KNEE ARTHROPLASTY;  Surgeon: Dorna Leitz, MD;  Location: [place];  Service: Orthopedics;  Laterality: Right;     reports that he has never smoked. He has never used smokeless tobacco. He reports that he does not drink alcohol or use drugs.  No Known Allergies  Family History  Problem Relation Age of Onset  . Cancer Mother        unknown type  . Stroke Father   . Cancer Maternal Grandmother   . Appendicitis Paternal Grandfather   . Prostate cancer Brother   . Colon cancer Neg Hx   . Esophageal cancer Neg Hx   . Rectal cancer Neg Hx   . Stomach cancer Neg Hx   . Colon polyps Neg Hx      Prior to Admission medications   Medication Sig Start Date End Date Taking? Authorizing Provider  acetaminophen (TYLENOL 8 HOUR) 650 MG CR tablet Take 1,300 mg by mouth 2 (two) times daily.     [provider]  ARIPiprazole (ABILIFY) 5 MG tablet Take 1 tablet (5 mg total) by mouth daily. Patient taking differently: Take 2.5 mg by mouth daily.  09/14/11   Ricard Dillon, MD  b complex vitamins tablet Take 1 tablet by mouth daily.    [provider]  Calcium-Vitamin D (CALTRATE 600 PLUS-VIT D PO) Take 1 tablet by mouth daily.     [provider]  ferrous sulfate 325 (65 FE) MG tablet Take 325 mg by mouth daily with breakfast.    [provider]  torsemide (DEMADEX) 20 MG tablet TAKE 1 TABLET BY MOUTH DAILY AS NEEDED FOR EDEMA, WEIGHT GAIN 09/21/18   [name], MD  tranylcypromine (PARNATE) 10 MG tablet Take 30 mg by mouth 2 (two) times daily.     [provider]     Physical Exam: Vitals:   03/24/19 1502 03/24/19 1600 03/24/19 1700 03/24/19 1710  BP: 121/78 130/74 134/73 134/73  Pulse: 87 84 91 81  Resp: 17 (!) 23 18 20   Temp:      TempSrc:      SpO2: 94% 93% 96% 97%    Constitutional: NAD, calm, comfortable, non toxic appearing laying in bed at 20 degree incline.  Head is held side bent towards her right secondary to pain from left upper extremity. Vitals:   03/24/19 1502 03/24/19 1600 03/24/19 1700 03/24/19 1710  BP: 121/78 130/74 134/73 134/73  Pulse: 87 84 91 81  Resp: 17 (!) 23 18 20   Temp:      TempSrc:  SpO2: 94% 93% 96% 97%   Eyes: PERRL, lids and conjunctivae normal ENMT: Mucous membranes are moist.  Neck: normal, supple Respiratory: ronchus sounds throughout worse at the bases, no wheezing or crackles. Normal respiratory effort on ambient air. No accessory muscle use.  Cardiovascular: sinus tachycardia with heart rate about 105 on telemetry, no murmurs / rubs / gallops.  Circumferential lower extremity +3 pitting edema distal to the knees.  Difficult to assess for pedal pulse due to edema. Abdomen: no tenderness, no masses palpated. No hepatosplenomegal. Bowel sounds positive.  GU: condom catheter in place Musculoskeletal:  No joint deformity upper and lower extremities.  Normal muscle tone.  Edematous left upper extremity with bluish discoloration of the dorsal left hand, +2 radial pulse, intact sensation.  Left lower extremity is wrapped in compression stocking and wound dressing. Skin: no rashes, lesions, ulcers. No induration Neurologic: CN 2-12 grossly intact. Sensation intact. Strength 4/5 in lower extremity.  Week hand grasp. Psychiatric: Normal judgment and insight although somewhat poor historian. Alert and oriented x 3. Normal mood.     Labs on Admission: I have personally reviewed following labs and imaging studies  CBC: Recent Labs  Lab 03/24/19 1418  WBC 7.0  NEUTROABS 4.8  HGB 9.5*  HCT 30.5*  MCV  102.0*  PLT 454   Basic Metabolic Panel: Recent Labs  Lab 03/24/19 1418  NA 143  K 4.0  CL 110  CO2 23  GLUCOSE 88  BUN 45*  CREATININE 2.30*  CALCIUM 9.6   GFR: Estimated Creatinine Clearance: 24.5 mL/min (A) (by C-G formula based on SCr of 2.3 mg/dL (H)). Liver Function Tests: Recent Labs  Lab 03/24/19 1418  AST 22  ALT 15  ALKPHOS 83  BILITOT 0.6  PROT 6.4*  ALBUMIN 3.4*   No results for input(s): LIPASE, AMYLASE in the last 168 hours. No results for input(s): AMMONIA in the last 168 hours. Coagulation Profile: No results for input(s): INR, PROTIME in the last 168 hours. Cardiac Enzymes: No results for input(s): CKTOTAL, CKMB, CKMBINDEX, TROPONINI in the last 168 hours. BNP (last 3 results) Recent Labs    01/29/19 1625  PROBNP 103   HbA1C: No results for input(s): HGBA1C in the last 72 hours. CBG: No results for input(s): GLUCAP in the last 168 hours. Lipid Profile: No results for input(s): CHOL, HDL, LDLCALC, TRIG, CHOLHDL, LDLDIRECT in the last 72 hours. Thyroid Function Tests: No results for input(s): TSH, T4TOTAL, FREET4, T3FREE, THYROIDAB in the last 72 hours. Anemia Panel: No results for input(s): VITAMINB12, FOLATE, FERRITIN, TIBC, IRON, RETICCTPCT in the last 72 hours. Urine analysis:    Component Value Date/Time   COLORURINE YELLOW 11/27/2015 1505   APPEARANCEUR CLEAR 11/27/2015 1505   LABSPEC 1.016 11/27/2015 1505   PHURINE 5.5 11/27/2015 1505   GLUCOSEU NEGATIVE 11/27/2015 1505   HGBUR NEGATIVE 11/27/2015 1505   HGBUR negative 10/03/2009 1528   BILIRUBINUR Negative 07/11/2018 [place] 11/27/2015 1505   PROTEINUR Negative 07/11/2018 1207   PROTEINUR NEGATIVE 11/27/2015 1505   UROBILINOGEN 0.2 07/11/2018 1207   UROBILINOGEN 0.2 10/03/2009 1528   NITRITE Negative 07/11/2018 1207   NITRITE NEGATIVE 11/27/2015 1505   LEUKOCYTESUR Negative 07/11/2018 1207    Radiological Exams on Admission: DG Chest Portable 1  View  Result Date: 03/24/2019 CLINICAL DATA:  Shortness of breath EXAM: PORTABLE CHEST 1 VIEW COMPARISON:  02/05/2019 FINDINGS: Cardiac shadows within normal limits. The lungs are well aerated bilaterally. No focal infiltrate or sizable effusion is seen.  Calcified granuloma is noted in the left mid lung. Right shoulder replacement is noted. IMPRESSION: No active disease. Electronically Signed   By: Inez Catalina M.D.   On: 03/24/2019 14:40   UE VENOUS DUPLEX ([facility] & [facility] 7 am - 7 pm)  Result Date: 03/24/2019 UPPER VENOUS STUDY  Indications: Swelling Risk Factors: Trauma Fracture. Comparison Study: No prior studies. Performing Technologist: Oliver Hum RVT  Examination Guidelines: A complete evaluation includes B-mode imaging, spectral Doppler, color Doppler, and power Doppler as needed of all accessible portions of each vessel. Bilateral testing is considered an integral part of a complete examination. Limited examinations for reoccurring indications may be performed as noted.  Right Findings: +----------+------------+---------+-----------+----------+-------+ RIGHT     CompressiblePhasicitySpontaneousPropertiesSummary +----------+------------+---------+-----------+----------+-------+ Subclavian    Full       Yes       Yes                      +----------+------------+---------+-----------+----------+-------+  Left Findings: +----------+------------+---------+-----------+----------+-------+ LEFT      CompressiblePhasicitySpontaneousPropertiesSummary +----------+------------+---------+-----------+----------+-------+ IJV           Full       Yes       Yes                      +----------+------------+---------+-----------+----------+-------+ Subclavian    Full       Yes       Yes                      +----------+------------+---------+-----------+----------+-------+ Axillary      Full       Yes       Yes                       +----------+------------+---------+-----------+----------+-------+ Brachial      Full       Yes       Yes                      +----------+------------+---------+-----------+----------+-------+ Radial        Full                                          +----------+------------+---------+-----------+----------+-------+ Ulnar         Full                                          +----------+------------+---------+-----------+----------+-------+ Cephalic      Full                                          +----------+------------+---------+-----------+----------+-------+ Basilic       Full                                          +----------+------------+---------+-----------+----------+-------+  Summary:  Right: No evidence of thrombosis in the subclavian.  Left: No evidence of deep vein thrombosis in the upper extremity. No evidence of superficial vein thrombosis in the upper extremity.  *See table(s) above for measurements and observations.  Diagnosing  physician: Deitra Mayo MD Electronically signed by Deitra Mayo MD on 03/24/2019 at 6:42:29 PM.    Final     EKG: Independently reviewed.   Assessment/Plan  Acute diastolic heart failure in the setting of non-compliance with Torsemide Strict Is and Os Daily weight  Received IV 40mg  Lasix in ED Continue daily IV 40mg  Lasix- normally on 20mg  PO Torsemide at home  Acute on chronic Anemia Hgb of 9.5 down from 11 days ago Suspect due to hemodilution from hypervolemia Monitor for now  Left LE wound Wound care per RN Pt sees wound care every Wednesday outpatient   Left humerus fx/weakness Negative DVT ultrasound PT eval  Put in sling and evaluate  Pt did not want opioid or narcotics. Continue Tylenol PRN.Cannot receive NSAIDS due to CKD  Bipolar disorder Type 1 Continue Abilify  continue tranycypromine  CKD Stage IV stable  Avoid nephrotoxic agent  OSA Continue CPAP  DVT  prophylaxis:.Lovenox Code Status:Full Family Communication: Plan discussed with patient at bedside  disposition Plan: Home with at least 2 midnight stays  Consults called:  Admission status: inpatient   Kelcee Bjorn T Rejoice Heatwole DO [facility]   If 7PM-7AM, please contact night-coverage [email or web address] Password [facility]  03/24/2019, 6:49 PM

## 2019-03-24 NOTE — Progress Notes (Signed)
CPAP on hold until second covid test comes back.

## 2019-03-24 NOTE — ED Notes (Signed)
ED TO INPATIENT HANDOFF REPORT  Name/Age/Gender [name] 79 y.o. male  Code Status    Code Status Orders  (From admission, onward)         Start     Ordered   03/24/19 1851  Full code  Continuous     03/24/19 1852        Code Status History    Date Active Date Inactive Code Status Order ID Comments User Context   03/10/2018 1751 03/11/2018 1355 Full Code [id number]  Elpidio Cordale Inpatient   12/08/2015 2021 12/11/2015 2023 Full Code [id number]  Elmon Else Inpatient   05/22/2013 1501 05/23/2013 1826 Full Code [id number]  Grier Mitts, PA-C Inpatient   Advance Care Planning Activity    Advance Directive Documentation     Most Recent Value  Type of Advance Directive  Living will  Pre-existing out of facility DNR order (yellow form or pink MOST form)  --  "MOST" Form in Place?  --      Home/SNF/Other Home  Chief Complaint Acute CHF (congestive heart failure) ([place]) [I50.9]  Level of Care/Admitting Diagnosis ED Disposition    ED Disposition Condition [place]: [place] [[id number]]  Level of Care: Telemetry [5]  Admit to tele based on following criteria: Acute CHF  Covid Evaluation: Asymptomatic Screening Protocol (No Symptoms)  Diagnosis: Acute CHF (congestive heart failure) [facility]) [[id number]]  Admitting Physician: Orene Desanctis [[id number]]  Attending Physician: Orene Desanctis [[id number]]  Estimated length of stay: past midnight tomorrow  Certification:: I certify this patient will need inpatient services for at least 2 midnights       Medical History Past Medical History:  Diagnosis Date  . Arthritis    back  . Back pain    reason unknown  . Benign prostatic hyperplasia (BPH) with urinary urge incontinence    sees Dr. Jeffie Pollock   . Bipolar 1 disorder [facility])    sees Dr. Norma Fredrickson  . Chronic diastolic CHF (congestive heart failure) ([place]) 11/30/2017   right Heart cath in [facility] 10/2017 showed  normal filling pressures. 2D echo with EF 55-60%, grade 1 DD, MIld RVE with normal RVF (normal RV size with followup echo 09/09/2017), trivial MR/PR, mild TR  . CKD (chronic kidney disease)    stage 3 followed by [facility]  . Depression    takes Parnate daily  . Diverticulitis of colon 05/27/2015  . Essential hypertension 04/11/2007  . Headache(784.0)    occasionally  . Hip fracture ([place]) 09/01/2017   left hip fracture  . History of colon polyps   . Hypothyroidism 10/03/2009   Noted by Dr. Arnoldo Morale- patient states never on medicine- may have been checked due to parnate and abilify   . 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 Joint pain   . Joint swelling   . Osteoporosis    takes Drisdol weekly and Caltrate daily  . Peripheral edema    takes torsemide daily followed by nephrology  . Shingles 05/27/2015  . Sleep apnea    wears c-pap    Allergies No Known Allergies  IV Location/Drains/Wounds Patient Lines/Drains/Airways Status   Active Line/Drains/Airways    Name:   Placement date:   Placement time:   Site:   Days:   Peripheral IV 03/24/19 Right Antecubital   03/24/19    1511    Antecubital   less than 1  Closed System Drain 1 Right Shoulder Accordion (Hemovac) 10 Fr.   05/22/13    1300    Shoulder   2132   External Urinary Catheter   03/24/19    1851    --   less than 1   Incision (Closed) 05/22/13 Shoulder Right   05/22/13    1318     2132   Incision (Closed) 12/08/15 Leg Right   12/08/15    1539     1202   Incision (Closed) 03/10/18 Back Other (Comment)   03/10/18    1606     379          Labs/Imaging Results for orders placed or performed during the hospital encounter of 03/24/19 (from the past 48 hour(s))  Comprehensive metabolic panel     Status: Abnormal   Collection Time: 03/24/19  2:18 PM  Result Value Ref Range   Sodium 143 135 - 145 mmol/L   Potassium 4.0 3.5 - 5.1 mmol/L   Chloride 110 98 - 111 mmol/L    CO2 23 22 - 32 mmol/L   Glucose, Bld 88 70 - 99 mg/dL   BUN 45 (H) 8 - 23 mg/dL   Creatinine, Ser 2.30 (H) 0.61 - 1.24 mg/dL   Calcium 9.6 8.9 - 10.3 mg/dL   Total Protein 6.4 (L) 6.5 - 8.1 g/dL   Albumin 3.4 (L) 3.5 - 5.0 g/dL   AST 22 15 - 41 U/L   ALT 15 0 - 44 U/L   Alkaline Phosphatase 83 38 - 126 U/L   Total Bilirubin 0.6 0.3 - 1.2 mg/dL   GFR calc non Af Amer 26 (L) >60 mL/min   GFR calc Af Amer 30 (L) >60 mL/min   Anion gap 10 5 - 15    Comment: Performed at [facility], [place] [address], [place], [place] [address]  Troponin I (High Sensitivity)     Status: None   Collection Time: 03/24/19  2:18 PM  Result Value Ref Range   Troponin I (High Sensitivity) 10 <18 ng/L    Comment: (NOTE) Elevated high sensitivity troponin I (hsTnI) values and significant  changes across serial measurements may suggest ACS but many other  chronic and acute conditions are known to elevate hsTnI results.  Refer to the "Links" section for chest pain algorithms and additional  guidance. Performed at [facility], [place] [address] [place], [place] [address]   CBC with Differential     Status: Abnormal   Collection Time: 03/24/19  2:18 PM  Result Value Ref Range   WBC 7.0 4.0 - 10.5 K/uL   RBC 2.99 (L) 4.22 - 5.81 MIL/uL   Hemoglobin 9.5 (L) 13.0 - 17.0 g/dL   HCT 30.5 (L) 39.0 - 52.0 %   MCV 102.0 (H) 80.0 - 100.0 fL   MCH 31.8 26.0 - 34.0 pg   MCHC 31.1 30.0 - 36.0 g/dL   RDW 13.1 11.5 - 15.5 %   Platelets 214 150 - 400 K/uL   nRBC 0.0 0.0 - 0.2 %   Neutrophils Relative % 69 %   Neutro Abs 4.8 1.7 - 7.7 K/uL   Lymphocytes Relative 11 %   Lymphs Abs 0.8 0.7 - 4.0 K/uL   Monocytes Relative 14 %   Monocytes Absolute 1.0 0.1 - 1.0 K/uL   Eosinophils Relative 6 %   Eosinophils Absolute 0.4 0.0 - 0.5 K/uL   Basophils Relative 0 %   Basophils Absolute 0.0 0.0 - 0.1  K/uL   Immature Granulocytes 0 %   Abs Immature Granulocytes 0.03 0.00 - 0.07 K/uL     Comment: Performed at [facility], [place] [address] [place], [place] [address]  Brain natriuretic peptide     Status: None   Collection Time: 03/24/19  2:19 PM  Result Value Ref Range   B Natriuretic Peptide 46.0 0.0 - 100.0 pg/mL    Comment: Performed at [facility], [place] [address], [place], [place] [address]  POC SARS Coronavirus 2 Ag-ED - Nasal Swab (BD Veritor Kit)     Status: None   Collection Time: 03/24/19  4:04 PM  Result Value Ref Range   SARS Coronavirus 2 Ag NEGATIVE NEGATIVE    Comment: (NOTE) SARS-CoV-2 antigen NOT DETECTED.  Negative results are presumptive.  Negative results do not preclude SARS-CoV-2 infection and should not be used as the sole basis for treatment or other patient management decisions, including infection  control decisions, particularly in the presence of clinical signs and  symptoms consistent with COVID-19, or in those who have been in contact with the virus.  Negative results must be combined with clinical observations, patient history, and epidemiological information. The expected result is Negative. Fact Sheet for Patients: PodPark.tn Fact Sheet for Healthcare Providers: GiftContent.is This test is not yet approved or cleared by the [place] FDA and  has been authorized for detection and/or diagnosis of SARS-CoV-2 by FDA under an Emergency Use Authorization (EUA).  This EUA will remain in effect (meaning this test can be used) for the duration of  the COVID-19 de claration under Section 564(b)(1) of the Act, 21 U.S.C. section 360bbb-3(b)(1), unless the authorization is terminated or revoked sooner.   Troponin I (High Sensitivity)     Status: None   Collection Time: 03/24/19  5:51 PM  Result Value Ref Range   Troponin I (High Sensitivity) 9 <18 ng/L    Comment: (NOTE) Elevated high sensitivity troponin I (hsTnI) values and significant   changes across serial measurements may suggest ACS but many other  chronic and acute conditions are known to elevate hsTnI results.  Refer to the "Links" section for chest pain algorithms and additional  guidance. Performed at [facility], [place] [address], [place], [place] [address]    DG Chest Portable 1 View  Result Date: 03/24/2019 CLINICAL DATA:  Shortness of breath EXAM: PORTABLE CHEST 1 VIEW COMPARISON:  02/05/2019 FINDINGS: Cardiac shadows within normal limits. The lungs are well aerated bilaterally. No focal infiltrate or sizable effusion is seen. Calcified granuloma is noted in the left mid lung. Right shoulder replacement is noted. IMPRESSION: No active disease. Electronically Signed   By: Inez Catalina M.D.   On: 03/24/2019 14:40   UE VENOUS DUPLEX ([facility] & [facility] 7 am - 7 pm)  Result Date: 03/24/2019 UPPER VENOUS STUDY  Indications: Swelling Risk Factors: Trauma Fracture. Comparison Study: No prior studies. Performing Technologist: Oliver Hum RVT  Examination Guidelines: A complete evaluation includes B-mode imaging, spectral Doppler, color Doppler, and power Doppler as needed of all accessible portions of each vessel. Bilateral testing is considered an integral part of a complete examination. Limited examinations for reoccurring indications may be performed as noted.  Right Findings: +----------+------------+---------+-----------+----------+-------+ RIGHT     CompressiblePhasicitySpontaneousPropertiesSummary +----------+------------+---------+-----------+----------+-------+ Subclavian    Full       Yes       Yes                      +----------+------------+---------+-----------+----------+-------+  Left Findings: +----------+------------+---------+-----------+----------+-------+ LEFT      CompressiblePhasicitySpontaneousPropertiesSummary +----------+------------+---------+-----------+----------+-------+ IJV           Full       Yes       Yes                       +----------+------------+---------+-----------+----------+-------+ Subclavian    Full       Yes       Yes                      +----------+------------+---------+-----------+----------+-------+ Axillary      Full       Yes       Yes                      +----------+------------+---------+-----------+----------+-------+ Brachial      Full       Yes       Yes                      +----------+------------+---------+-----------+----------+-------+ Radial        Full                                          +----------+------------+---------+-----------+----------+-------+ Ulnar         Full                                          +----------+------------+---------+-----------+----------+-------+ Cephalic      Full                                          +----------+------------+---------+-----------+----------+-------+ Basilic       Full                                          +----------+------------+---------+-----------+----------+-------+  Summary:  Right: No evidence of thrombosis in the subclavian.  Left: No evidence of deep vein thrombosis in the upper extremity. No evidence of superficial vein thrombosis in the upper extremity.  *See table(s) above for measurements and observations.  Diagnosing physician: Deitra Mayo MD Electronically signed by Deitra Mayo MD on 03/24/2019 at 6:42:29 PM.    Final     Pending Labs Unresulted Labs (From admission, onward)    Start     Ordered   03/25/19 2751  Basic metabolic panel  Tomorrow morning,   R     03/24/19 1852   03/25/19 0500  CBC  Tomorrow morning,   R     03/24/19 1852   03/24/19 1738  SARS CORONAVIRUS 2 (TAT 6-24 HRS) Nasopharyngeal Nasopharyngeal Swab  (Tier 3 (TAT 6-24 hrs))  Once,   STAT    Question Answer Comment  Is this test for diagnosis or screening Screening   Symptomatic for COVID-19 as defined by CDC No   Hospitalized for COVID-19 No   Admitted to ICU for  COVID-19 No   Previously tested for COVID-19 Yes   Resident in a congregate (group) care setting No   Employed in healthcare setting No  03/24/19 1737          Vitals/Pain Today's Vitals   03/24/19 1900 03/24/19 2000 03/24/19 2100 03/24/19 2105  BP: 136/89 128/71 (!) 136/93   Pulse: (!) 109 81 92 93  Resp: '13 14 12 18  ' Temp:    (!) 97.3 F (36.3 C)  TempSrc:    Oral  SpO2: 98% 96% 96% 97%  PainSc:    8     Isolation Precautions No active isolations  Medications Medications  enoxaparin (LOVENOX) injection 30 mg (has no administration in time range)  furosemide (LASIX) injection 40 mg (40 mg Intravenous Given 03/24/19 1751)    Mobility non-ambulatory

## 2019-03-24 NOTE — ED Triage Notes (Signed)
Pt presents with c/o shortness of breath. Pt reports that he has been experiencing these symptoms for approx 2 weeks. Pt reports that he does have some pain in his left hand and shoulder from a previous injury. Pt does appear to have some shortness of breath but is in no acute distress and O2 sats are stable in triage on RA.

## 2019-03-24 NOTE — ED Notes (Signed)
Pt ambulated back from the restroom with x2 assist. Pts o2sat and pulse both at [place]. pt stated he felt shob during ambulation. Pt is back in bed at this time.

## 2019-03-25 LAB — SARS CORONAVIRUS 2 (TAT 6-24 HRS): SARS Coronavirus 2: NEGATIVE

## 2019-03-25 LAB — CBC
HCT: 29 % — ABNORMAL LOW (ref 39.0–52.0)
Hemoglobin: 8.9 g/dL — ABNORMAL LOW (ref 13.0–17.0)
MCH: 31.6 pg (ref 26.0–34.0)
MCHC: 30.7 g/dL (ref 30.0–36.0)
MCV: 102.8 fL — ABNORMAL HIGH (ref 80.0–100.0)
Platelets: 204 10*3/uL (ref 150–400)
RBC: 2.82 MIL/uL — ABNORMAL LOW (ref 4.22–5.81)
RDW: 13.1 % (ref 11.5–15.5)
WBC: 5.8 10*3/uL (ref 4.0–10.5)
nRBC: 0 % (ref 0.0–0.2)

## 2019-03-25 LAB — BASIC METABOLIC PANEL
Anion gap: 10 (ref 5–15)
BUN: 43 mg/dL — ABNORMAL HIGH (ref 8–23)
CO2: 24 mmol/L (ref 22–32)
Calcium: 9.1 mg/dL (ref 8.9–10.3)
Chloride: 108 mmol/L (ref 98–111)
Creatinine, Ser: 2.23 mg/dL — ABNORMAL HIGH (ref 0.61–1.24)
GFR calc Af Amer: 31 mL/min — ABNORMAL LOW (ref >60)
GFR calc non Af Amer: 27 mL/min — ABNORMAL LOW (ref >60)
Glucose, Bld: 93 mg/dL (ref 70–99)
Potassium: 4 mmol/L (ref 3.5–5.1)
Sodium: 142 mmol/L (ref 135–145)

## 2019-03-25 MED ORDER — TROLAMINE SALICYLATE 10 % EX CREA
TOPICAL_CREAM | Freq: Two times a day (BID) | CUTANEOUS | Status: DC | PRN
  Filled 2019-03-25: qty 85

## 2019-03-25 MED ORDER — MUSCLE RUB 10-15 % EX CREA
TOPICAL_CREAM | Freq: Two times a day (BID) | CUTANEOUS | Status: DC | PRN
  Administered 2019-03-25: 1 via TOPICAL
  Filled 2019-03-25: qty 85

## 2019-03-25 NOTE — Progress Notes (Signed)
Orthopedic Tech Progress Note Patient Details:  [name] Sep 21, 1940 [id number] Nurse called and said PT requested regular sling for patient.  Ortho Devices Type of Ortho Device: Arm sling Ortho Device/Splint Location: LUE Ortho Device/Splint Interventions: Ordered, Application   Post Interventions Patient Tolerated: Fair Instructions Provided: Care of device   [name] N [name] 03/25/2019, 1:33 PM

## 2019-03-25 NOTE — Progress Notes (Addendum)
PROGRESS NOTE    [name]  MOQ:[id number] DOB: 1940-09-08 DOA: 03/24/2019 PCP: [name], MD Brief Narrative:  [name] is a 79 y.o. male with medical history significant of  Diastolic congestive heart failure, chronic kidney disease stage IV, OSA on CPAP, bipolar disorder type I, and osteoporosis who presents with concerns of increasing shortness of breath. Patient fell on about 12/22 and suffered a fracture to his left proximal humerus. Since then he has stopped taking his torsemide due to increased urination and difficulty with changing his adult diaper.  He has since noticed dyspnea with rest and with exertion for the past week..  Also notes increased lower extremity edema.  Assessment & Plan:   Active Problems:   Mild depressed bipolar I disorder (HCC)   CKD (chronic kidney disease), stage IV (HCC)   OSA (obstructive sleep apnea)   Proximal humerus fracture   Acute CHF (congestive heart failure) (HCC)   Anemia due to stage 4 chronic kidney disease (HCC)   Wound of left lower extremity   Weakness   Acute diastolic heart failure in the setting of non-compliance with Torsemide Strict Is and Os Daily weight  Continue daily IV 40mg  Lasix- normally on 20mg  PO Torsemide at home Monitor electrolytes closely while on IV diuretic   Acute on chronic Anemia Hgb of 9.5 down from 11 days ago Suspect due to hemodilution from hypervolemia Monitor for now  Left LE wound Wound care per RN Pt sees wound care every Wednesday outpatient   Left humerus fx/weakness Negative DVT ultrasound PT eval  Continue shoulder sling  Pt did not want opioid or narcotics. Continue Tylenol PRN.Cannot receive NSAIDS due to CKD  Bipolar disorder Type 1 Continue Abilify  continue tranycypromine  CKD Stage IV stable  Avoid nephrotoxic agent  OSA Continue CPAP  DVT prophylaxis:Lovenox Code Status:Full Family Communication: Plan discussed with patient at bedside   disposition Plan: PT eval pending.  Consults called: None  Procedures: none Antimicrobials: None  Subjective: He feels tired today but reports that his breathing is slightly improved. He has cramps in his legs, reports that he uses and OTC spray at home that helps with this.   Objective: Vitals:   03/24/19 2105 03/24/19 2200 03/25/19 0506 03/25/19 1339  BP:  130/89 (!) 106/58 (!) 122/105  Pulse: 93 91 84 80  Resp: 18 18 18  (!) 22  Temp: (!) 97.3 F (36.3 C) (!) 97.2 F (36.2 C) 97.9 F (36.6 C) 97.6 F (36.4 C)  TempSrc: Oral Oral Oral Oral  SpO2: 97% 99% 98% (!) 87%  Weight:  83.5 kg 83.5 kg   Height:  5\' 2"  (1.575 m)      Intake/Output Summary (Last 24 hours) at 03/25/2019 1825 Last data filed at 03/25/2019 1400 Gross per 24 hour  Intake 1240 ml  Output 3000 ml  Net -1760 ml   Filed Weights   03/24/19 2200 03/25/19 0506  Weight: 83.5 kg 83.5 kg    Examination:  General exam: Appears calm and comfortable  Respiratory system: Respiratory effort normal. Cardiovascular system: S1 & S2 heard, RRR.  Gastrointestinal system: Abdomen is nondistended, soft and nontender. Central nervous system: Alert and oriented. No focal neurological deficits. Extremities: Symmetric 5 x 5 power. Pitting edema bl LE. Left  arm on shoulder sling.  Skin: No rashes, lesions or ulcers Psychiatry: Appropriate mood and affect     Data Reviewed: I have personally reviewed following labs and imaging studies  CBC: Recent Labs  Lab 03/24/19  1418 03/25/19 0540  WBC 7.0 5.8  NEUTROABS 4.8  --   HGB 9.5* 8.9*  HCT 30.5* 29.0*  MCV 102.0* 102.8*  PLT 214 811   Basic Metabolic Panel: Recent Labs  Lab 03/24/19 1418 03/25/19 0540  NA 143 142  K 4.0 4.0  CL 110 108  CO2 23 24  GLUCOSE 88 93  BUN 45* 43*  CREATININE 2.30* 2.23*  CALCIUM 9.6 9.1   GFR: Estimated Creatinine Clearance: 25.2 mL/min (A) (by C-G formula based on SCr of 2.23 mg/dL (H)). Liver Function Tests: Recent  Labs  Lab 03/24/19 1418  AST 22  ALT 15  ALKPHOS 83  BILITOT 0.6  PROT 6.4*  ALBUMIN 3.4*   No results for input(s): LIPASE, AMYLASE in the last 168 hours. No results for input(s): AMMONIA in the last 168 hours. Coagulation Profile: No results for input(s): INR, PROTIME in the last 168 hours. Cardiac Enzymes: No results for input(s): CKTOTAL, CKMB, CKMBINDEX, TROPONINI in the last 168 hours. BNP (last 3 results) Recent Labs    01/29/19 1625  PROBNP 103   HbA1C: No results for input(s): HGBA1C in the last 72 hours. CBG: No results for input(s): GLUCAP in the last 168 hours. Lipid Profile: No results for input(s): CHOL, HDL, LDLCALC, TRIG, CHOLHDL, LDLDIRECT in the last 72 hours. Thyroid Function Tests: No results for input(s): TSH, T4TOTAL, FREET4, T3FREE, THYROIDAB in the last 72 hours. Anemia Panel: No results for input(s): VITAMINB12, FOLATE, FERRITIN, TIBC, IRON, RETICCTPCT in the last 72 hours. Sepsis Labs: No results for input(s): PROCALCITON, LATICACIDVEN in the last 168 hours.  Recent Results (from the past 240 hour(s))  SARS CORONAVIRUS 2 (TAT 6-24 HRS) Nasopharyngeal Nasopharyngeal Swab     Status: None   Collection Time: 03/24/19  5:51 PM   Specimen: Nasopharyngeal Swab  Result Value Ref Range Status   SARS Coronavirus 2 NEGATIVE NEGATIVE Final    Comment: (NOTE) SARS-CoV-2 target nucleic acids are NOT DETECTED. The SARS-CoV-2 RNA is generally detectable in upper and lower respiratory specimens during the acute phase of infection. Negative results do not preclude SARS-CoV-2 infection, do not rule out co-infections with other pathogens, and should not be used as the sole basis for treatment or other patient management decisions. Negative results must be combined with clinical observations, patient history, and epidemiological information. The expected result is Negative. Fact Sheet for Patients: SugarRoll.be Fact Sheet for  Healthcare Providers: https://www.woods-mathews.com/ This test is not yet approved or cleared by the [place] FDA and  has been authorized for detection and/or diagnosis of SARS-CoV-2 by FDA under an Emergency Use Authorization (EUA). This EUA will remain  in effect (meaning this test can be used) for the duration of the COVID-19 declaration under Section 56 4(b)(1) of the Act, 21 U.S.C. section 360bbb-3(b)(1), unless the authorization is terminated or revoked sooner. Performed at [facility], [place] [address], [place], [place] [address]          Radiology Studies: DG Chest Portable 1 View  Result Date: 03/24/2019 CLINICAL DATA:  Shortness of breath EXAM: PORTABLE CHEST 1 VIEW COMPARISON:  02/05/2019 FINDINGS: Cardiac shadows within normal limits. The lungs are well aerated bilaterally. No focal infiltrate or sizable effusion is seen. Calcified granuloma is noted in the left mid lung. Right shoulder replacement is noted. IMPRESSION: No active disease. Electronically Signed   By: Inez Catalina M.D.   On: 03/24/2019 14:40   UE VENOUS DUPLEX ([facility] & [facility] 7 am - 7 pm)  Result Date: 03/24/2019 UPPER VENOUS STUDY  Indications: Swelling Risk Factors: Trauma Fracture. Comparison Study: No prior studies. Performing Technologist: Oliver Hum RVT  Examination Guidelines: A complete evaluation includes B-mode imaging, spectral Doppler, color Doppler, and power Doppler as needed of all accessible portions of each vessel. Bilateral testing is considered an integral part of a complete examination. Limited examinations for reoccurring indications may be performed as noted.  Right Findings: +----------+------------+---------+-----------+----------+-------+ RIGHT     CompressiblePhasicitySpontaneousPropertiesSummary +----------+------------+---------+-----------+----------+-------+ Subclavian    Full       Yes       Yes                       +----------+------------+---------+-----------+----------+-------+  Left Findings: +----------+------------+---------+-----------+----------+-------+ LEFT      CompressiblePhasicitySpontaneousPropertiesSummary +----------+------------+---------+-----------+----------+-------+ IJV           Full       Yes       Yes                      +----------+------------+---------+-----------+----------+-------+ Subclavian    Full       Yes       Yes                      +----------+------------+---------+-----------+----------+-------+ Axillary      Full       Yes       Yes                      +----------+------------+---------+-----------+----------+-------+ Brachial      Full       Yes       Yes                      +----------+------------+---------+-----------+----------+-------+ Radial        Full                                          +----------+------------+---------+-----------+----------+-------+ Ulnar         Full                                          +----------+------------+---------+-----------+----------+-------+ Cephalic      Full                                          +----------+------------+---------+-----------+----------+-------+ Basilic       Full                                          +----------+------------+---------+-----------+----------+-------+  Summary:  Right: No evidence of thrombosis in the subclavian.  Left: No evidence of deep vein thrombosis in the upper extremity. No evidence of superficial vein thrombosis in the upper extremity.  *See table(s) above for measurements and observations.  Diagnosing physician: Deitra Mayo MD Electronically signed by Deitra Mayo MD on 03/24/2019 at 6:42:29 PM.    Final         Scheduled Meds: . ARIPiprazole  2.5 mg Oral Daily  . enoxaparin (LOVENOX) injection  30 mg Subcutaneous Q24H  .  furosemide  40 mg Intravenous Daily  . l-methylfolate-B6-B12  1 tablet Oral  Daily  . tranylcypromine  30 mg Oral BID   Continuous Infusions:   LOS: 1 day    Time spent: 27 minutes     Blain Pais, MD [facility]   If 7PM-7AM, please contact night-coverage [email or web address] Password [facility] 03/25/2019, 6:25 PM

## 2019-03-25 NOTE — Progress Notes (Signed)
Patient had family member bring in Theraworx relief foam for leg cramps. Verified with Dr. Hayes Ludwig that this was ok for patient to receive.

## 2019-03-25 NOTE — Progress Notes (Signed)
PT Cancellation Note  Patient Details Name: [name] MRN: [id number] DOB: November 24, 1940   Cancelled Treatment:    Reason Eval/Treat Not Completed: Pain limiting ability to participate--2nd attempt for PT eval on today. Pt declines participation 2* pain in legs and fatigue. He is agreeable to therapy checking back on tomorrow.)    Doreatha Massed, PT Acute Rehabilitation

## 2019-03-25 NOTE — Plan of Care (Signed)
  Problem: Education: Goal: Knowledge of General Education information will improve Description: Including pain rating scale, medication(s)/side effects and non-pharmacologic comfort measures Outcome: Progressing   Problem: Health Behavior/Discharge Planning: Goal: Ability to manage health-related needs will improve Outcome: Progressing   Problem: Clinical Measurements: Goal: Will remain free from infection Outcome: Progressing   Problem: Clinical Measurements: Goal: Respiratory complications will improve Outcome: Progressing   Problem: Activity: Goal: Risk for activity intolerance will decrease Outcome: Progressing   Problem: Elimination: Goal: Will not experience complications related to bowel motility Outcome: Progressing   Problem: Pain Managment: Goal: General experience of comfort will improve Outcome: Progressing

## 2019-03-25 NOTE — Progress Notes (Signed)
PT Cancellation Note  Patient Details Name: [name] MRN: [id number] DOB: Sep 04, 1940   Cancelled Treatment:    Reason Eval/Treat Not Completed: Attempted PT eval-pt does not have appropriate sling in room for mobilizing. Made RN aware. Will check back as schedule allows.    Doreatha Massed, PT Acute Rehabilitation

## 2019-03-26 ENCOUNTER — Inpatient Hospital Stay (HOSPITAL_COMMUNITY): Payer: Medicare Other

## 2019-03-26 ENCOUNTER — Telehealth: Payer: Self-pay | Admitting: Family Medicine

## 2019-03-26 LAB — BASIC METABOLIC PANEL
Anion gap: 9 (ref 5–15)
BUN: 45 mg/dL — ABNORMAL HIGH (ref 8–23)
CO2: 27 mmol/L (ref 22–32)
Calcium: 8.9 mg/dL (ref 8.9–10.3)
Chloride: 105 mmol/L (ref 98–111)
Creatinine, Ser: 2.6 mg/dL — ABNORMAL HIGH (ref 0.61–1.24)
GFR calc Af Amer: 26 mL/min — ABNORMAL LOW (ref >60)
GFR calc non Af Amer: 22 mL/min — ABNORMAL LOW (ref >60)
Glucose, Bld: 101 mg/dL — ABNORMAL HIGH (ref 70–99)
Potassium: 3.9 mmol/L (ref 3.5–5.1)
Sodium: 141 mmol/L (ref 135–145)

## 2019-03-26 LAB — CBC
HCT: 30.1 % — ABNORMAL LOW (ref 39.0–52.0)
Hemoglobin: 9.5 g/dL — ABNORMAL LOW (ref 13.0–17.0)
MCH: 32.2 pg (ref 26.0–34.0)
MCHC: 31.6 g/dL (ref 30.0–36.0)
MCV: 102 fL — ABNORMAL HIGH (ref 80.0–100.0)
Platelets: 217 10*3/uL (ref 150–400)
RBC: 2.95 MIL/uL — ABNORMAL LOW (ref 4.22–5.81)
RDW: 13 % (ref 11.5–15.5)
WBC: 6.3 10*3/uL (ref 4.0–10.5)
nRBC: 0 % (ref 0.0–0.2)

## 2019-03-26 LAB — VITAMIN B12: Vitamin B-12: 567 pg/mL (ref 180–914)

## 2019-03-26 LAB — IRON AND TIBC
Iron: 44 ug/dL — ABNORMAL LOW (ref 45–182)
Saturation Ratios: 19 % (ref 17.9–39.5)
TIBC: 226 ug/dL — ABNORMAL LOW (ref 250–450)
UIBC: 182 ug/dL

## 2019-03-26 LAB — FERRITIN: Ferritin: 136 ng/mL (ref 24–336)

## 2019-03-26 LAB — MAGNESIUM: Magnesium: 2.3 mg/dL (ref 1.7–2.4)

## 2019-03-26 MED ORDER — FUROSEMIDE 10 MG/ML IJ SOLN
40.0000 mg | Freq: Every day | INTRAMUSCULAR | Status: DC
  Administered 2019-03-26 – 2019-03-28 (×3): 40 mg via INTRAVENOUS
  Filled 2019-03-26 (×3): qty 4

## 2019-03-26 NOTE — Consult Note (Signed)
[place] Nurse Consult Note: Reason for Consult: LE wound Patient is followed weekly at the [facility] wound care center Wound type: venous ulceration Pressure Injury POA: NA Measurement: 3.5cm x 1.5cm x 0.1cm  Wound bed:100% clean, pink slightly ruddy Drainage (amount, consistency, odor) minimal, serosanguinous  Periwound: edematous, hemosiderin staining bilaterally  Dressing procedure/placement/frequency: Patient self reports 3 layer compression when I discuss the types of compression with the patient.  Wound is doing well, appears to be healing, wound bed appears dry. Will add hydrogel with dry dressing.   Change Profore light (omit 3rd layer) weekly on Monday. WTA may change if available. [place] nurse to follow up on need.   [place], [place], [place]

## 2019-03-26 NOTE — Evaluation (Signed)
Physical Therapy Evaluation Patient Details Name: [name] MRN: [id number] DOB: 02-05-1941 Today's Date: 03/26/2019   History of Present Illness  79 yo male admitted with acute CHF. Recent L shoulder fx after falling at home. Hx of CHF, CKD, HTN, osteoporosis, OA  Clinical Impression  On eval, pt required Min assist for mobility. He walked ~85 feet with a quad cane. Dyspnea 2/4 with activity. O2 94% on RA. Pt is weak and unsteady at this time. Discussed d/c plan-pt would like to return home if possible. If he is unable to arrange in home assistance, may need to consider SNF. Will continue to follow and progress activity as tolerated.     Follow Up Recommendations Home health PT;Supervision/Assistance - 24 hour; Home Health Aide. (SNF if pt will not have assistance in home)    Equipment Recommendations  quad cane vs straight cane (depending on progress)    Recommendations for Other Services       Precautions / Restrictions Precautions Precautions: Fall Precaution Comments: L shoulder fx Required Braces or Orthoses: Sling Restrictions Weight Bearing Restrictions: Yes LUE Weight Bearing: Non weight bearing      Mobility  Bed Mobility Overal bed mobility: Needs Assistance Bed Mobility: Supine to Sit     Supine to sit: Min assist;HOB elevated     General bed mobility comments: Assist to scoot to EOB. Pt relied on bedrail. Increased time.  Transfers Overall transfer level: Needs assistance Equipment used: Quad cane Transfers: Sit to/from Stand Sit to Stand: Min assist;From elevated surface         General transfer comment: Assist to rise, stabilize, control descent. VCs safety, technique, hand placement. Unsteady  Ambulation/Gait Ambulation/Gait assistance: Min Web designer (Feet): 85 Feet Assistive device: Quad cane Gait Pattern/deviations: Step-to pattern;Step-through pattern;Decreased stride length     General Gait Details: Assist to stabilize pt  throughout distance. VCs safety, proper use of quad cane. Dyspnea 2/4. O2 sat 94% on RA  Stairs            Wheelchair Mobility    Modified Rankin (Stroke Patients Only)       Balance Overall balance assessment: Needs assistance         Standing balance support: Single extremity supported Standing balance-Leahy Scale: Poor                               Pertinent Vitals/Pain Pain Assessment: 0-10 Pain Score: 6  Pain Location: bil LEs, R foot, L shoulder Pain Descriptors / Indicators: Discomfort;Sore Pain Intervention(s): Monitored during session;Repositioned    Home Living Family/patient expects to be discharged to:: Private residence Living Arrangements: Spouse/significant other             Home Equipment: Environmental consultant - 2 wheels      Prior Function Level of Independence: Independent with assistive device(s)         Comments: uses RW for ambulation     Hand Dominance        Extremity/Trunk Assessment   Upper Extremity Assessment Upper Extremity Assessment: Defer to OT evaluation    Lower Extremity Assessment Lower Extremity Assessment: Generalized weakness    Cervical / Trunk Assessment Cervical / Trunk Assessment: Kyphotic  Communication   Communication: No difficulties  Cognition Arousal/Alertness: Awake/alert Behavior During Therapy: WFL for tasks assessed/performed Overall Cognitive Status: Within Functional Limits for tasks assessed  General Comments      Exercises     Assessment/Plan    PT Assessment Patient needs continued PT services  PT Problem List Decreased strength;Decreased mobility;Decreased activity tolerance;Decreased balance;Decreased knowledge of use of DME;Pain       PT Treatment Interventions DME instruction;Gait training;Therapeutic exercise;Therapeutic activities;Patient/family education;Balance training;Functional mobility training    PT Goals  (Current goals can be found in the Care Plan section)  Acute Rehab PT Goals Patient Stated Goal: to regain independence PT Goal Formulation: With patient Time For Goal Achievement: 04/09/19 Potential to Achieve Goals: Good    Frequency Min 3X/week   Barriers to discharge        Co-evaluation               AM-PAC PT "6 Clicks" Mobility  Outcome Measure Help needed turning from your back to your side while in a flat bed without using bedrails?: A Little Help needed moving from lying on your back to sitting on the side of a flat bed without using bedrails?: A Little Help needed moving to and from a bed to a chair (including a wheelchair)?: A Little Help needed standing up from a chair using your arms (e.g., wheelchair or bedside chair)?: A Little Help needed to walk in hospital room?: A Little Help needed climbing 3-5 steps with a railing? : A Little 6 Click Score: 18    End of Session Equipment Utilized During Treatment: Gait belt Activity Tolerance: Patient tolerated treatment well Patient left: in chair;with call bell/phone within reach;with chair alarm set   PT Visit Diagnosis: History of falling (Z91.81);Unsteadiness on feet (R26.81);Muscle weakness (generalized) (M62.81);Difficulty in walking, not elsewhere classified (R26.2)    Time: 0045-9977 PT Time Calculation (min) (ACUTE ONLY): 64 min   Charges:   PT Evaluation $PT Eval Moderate Complexity: 1 Mod PT Treatments $Gait Training: 23-37 mins $Therapeutic Activity: 8-22 mins          Doreatha Massed, PT Acute Rehabilitation

## 2019-03-26 NOTE — Telephone Encounter (Signed)
[place] [facility] [place] RECORD AccessNurse Patient Name: [name] Gender: Male DOB: [birth year] Age: 79 Y 1 M 8 D Return Phone Number: [phone] (Primary) Address: City/State/Zip: [place] [address] Client [place] [facility] [place] Client Site [place] [facility] [place] Night Physician Garret Reddish- MD Contact Type Call Who Is Calling Patient / Member / Family / Caregiver Call Type Triage / Clinical Relationship To Patient Self Return Phone Number [phone] (Primary) Chief Complaint BREATHING - shortness of breath or sounds breathless Reason for Call Symptomatic / Request for [place] is having breathing problems Translation No Nurse Assessment Nurse: Chesley Noon, RN, Lattie Haw Date/Time Eilene Ghazi Time): 03/24/2019 12:39:11 PM Confirm and document reason for call. If symptomatic, describe symptoms. ---Caller states he is having trouble breathing that started a couple weeks ago, worse now. No other symptoms. Has the patient had close contact with a person known or suspected to have the novel coronavirus illness OR traveled / lives in area with major community spread (including international travel) in the last 14 days from the onset of symptoms? * If Asymptomatic, screen for exposure and travel within the last 14 days. ---No Does the patient have any new or worsening symptoms? ---Yes Will a triage be completed? ---Yes Related visit to physician within the last 2 weeks? ---No Does the PT have any chronic conditions? (i.e. diabetes, asthma, this includes High risk factors for pregnancy, etc.) ---No Is this a behavioral health or substance abuse call? ---No Guidelines Guideline Title Affirmed Question Affirmed Notes Nurse Date/Time (Eastern Time) Coronavirus (COVID-19) - Diagnosed or Suspected MODERATE difficulty breathing (e.g., speaks in phrases, SOB  even at rest, pulse 100-120) Chesley Noon, RN, Lattie Haw 03/24/2019 12:40:36 PM Disp. Time Eilene Ghazi Time) Disposition Final User 03/24/2019 12:37:46 PM Send to Urgent Queue Ander Gaster 03/24/2019 12:46:05 PM Go to ED Now Yes Chesley Noon, RN, Lattie Haw PLEASE NOTE: All timestamps contained within this report are represented as [place] Standard Time. CONFIDENTIALTY NOTICE: This fax transmission is intended only for the addressee. It contains information that is legally privileged, confidential or otherwise protected from use or disclosure. If you are not the intended recipient, you are strictly prohibited from reviewing, disclosing, copying using or disseminating any of this information or taking any action in reliance on or regarding this information. If you have received this fax in error, please notify us immediately by telephone so that we can arrange for its return to [place]. Phone: [phone], Toll-Free: [phone], Fax: [phone] Page: 2 of 2 Call Id: [id number] [place] Disagree/Comply Comply Caller Understands Yes PreDisposition Did not know what to do Care Advice Given Per Guideline GO TO ED NOW: * You need to be seen in the Emergency Department. * Go to the ED at ___________ [place] now. Drive carefully. NOTE TO TRIAGER - TRIAGE NURSE SHOULD NOTIFY EMERGENCY DEPARTMENT (ED): * Tell the ED you are sending a patient with suspected diagnosis of COVID-19 who is getting worse and inform them of patient's symptoms. * Obtain and document the patient / caller's mobile phone number. Either keep the patient on hold or call the patient back with instructions. * Reason: So that ED can make plans to prevent coronavirus spread to others in the hospital. WEAR A MASK - COVER YOUR MOUTH AND NOSE: * Wear a mask. * If you do not have a mask, then cover your mouth and nose with a disposable tissue (e.g., Kleenex, toilet paper, paper towel) or  wash cloth. ANOTHER ADULT SHOULD DRIVE: * It is better and safer if  another adult drives instead of you. CALL EMS 911 IF: * Severe difficulty breathing occurs * Lips or face turns blue * Confusion occurs. CARE ADVICE given per CORONAVIRUS (COVID-19) - DIAGNOSED OR SUSPECTED (Adult) guideline. YOU SHOULD GO TO THE EMERGENCY DEPARTMENT (ED): * You will need to go to a nearby ED. * Do not leave until I've called and talked with the ED. The ED may have special instructions on how best to get you there. I will call you back (or place you on hold). YOU SHOULD TELL HEALTHCARE PERSONNEL THAT YOU MIGHT HAVE COVID-19: * Tell the first healthcare worker you meet that you may have COVID-19. * Tell them you have symptoms and have been sent for COVID-19 testing. * If you or your child needs to be seen for an urgent medical problem, do not hesitate to go. REASSURANCE AND EDUCATION - GOING TO THE ED OR URGENT CARE CENTER DURING THE COVID-19 PANDEMIC: * ERs and urgent care centers are safe places. They are well equipped to protect you against the virus. * For non-urgent conditions, talk to your healthcare provider's office first. Comments User: Dinah Beers, RN Date/Time Eilene Ghazi Time): 03/24/2019 12:46:50 PM Caller's family advised to call EMS if he has confusion, changes to his speech or severe trouble breathing. Referrals [facility] - ED

## 2019-03-26 NOTE — Telephone Encounter (Signed)
Noted  

## 2019-03-26 NOTE — Progress Notes (Addendum)
PROGRESS NOTE    [name]  BOF:[id number] DOB: 1940/11/28 DOA: 03/24/2019 PCP: [name], MD Brief Narrative:  [name] is a 79 y.o. male with medical history significant of  Diastolic congestive heart failure, chronic kidney disease stage IV, OSA on CPAP, bipolar disorder type I, and osteoporosis who presents with concerns of increasing shortness of breath. Patient fell on about 12/22 and suffered a fracture to his left proximal humerus. Since then he has stopped taking his torsemide due to increased urination and difficulty with changing his adult diaper.  He has since noticed dyspnea with rest and with exertion for the past week..  Also notes increased lower extremity edema.  Assessment & Plan:   Active Problems:   Mild depressed bipolar I disorder (HCC)   CKD (chronic kidney disease), stage IV (HCC)   OSA (obstructive sleep apnea)   Proximal humerus fracture   Acute CHF (congestive heart failure) (HCC)   Anemia due to stage 4 chronic kidney disease (HCC)   Wound of left lower extremity   Weakness   Acute diastolic heart failure in the setting of non-compliance with Torsemide Strict Is and Os Daily weight  Continue daily IV 40mg  Lasix- normally on 20mg  PO Torsemide at home Monitor electrolytes and Scr closely while on IV diuretic--Scr appears to be at baseline, his lower extremity edema is improving but still present  Acute on chronic Anemia Hgb of 9.5 down from 11 days ago Suspect due to hemodilution from hypervolemia Monitor for now  Left LE wound Wound care per RN Pt sees wound care every Wednesday outpatient   Left humerus fx/weakness Negative DVT ultrasound PT eval  Continue shoulder sling  Pt did not want opioid or narcotics. Continue Tylenol PRN.Cannot receive NSAIDS due to CKD OT has evaluated the patient, he has edema of the arm, recommended Ortho reeval.  I placed consult to Ortho, they will be seeing him either this evening or in the  am. Per their request, I have ordered a 2View left shoulder Xray.   Bipolar disorder Type 1 Continue Abilify  continue tranycypromine  CKD Stage IV stable  Avoid nephrotoxic agent  OSA Continue CPAP  Physical Debility Patient with weakness, abnormal gait, worsened since his fall.  Seen by PT with recommendation for SNF which patient is in agreement with Case management consult placed for SNF  DVT prophylaxis:Lovenox Code Status:Full Family Communication: Plan discussed with patient at bedside  disposition Plan: SNF   Consults called: Ortho  Procedures: none Antimicrobials: None  Subjective: He worked with PT today, reports that his gait was a lot worse than he anticipated. He requests possible SNF placement. Denies dyspnea but has persistent lower extremity edema.   Objective: Vitals:   03/25/19 2113 03/26/19 0500 03/26/19 0515 03/26/19 1317  BP: 94/64  111/67 102/69  Pulse: 81  61 90  Resp: 20  18 20   Temp: 97.8 F (36.6 C)  97.7 F (36.5 C) (!) 97.4 F (36.3 C)  TempSrc: Oral  Oral Oral  SpO2: 96%  94%   Weight:  83.5 kg    Height:        Intake/Output Summary (Last 24 hours) at 03/26/2019 1644 Last data filed at 03/26/2019 1405 Gross per 24 hour  Intake 990 ml  Output 2525 ml  Net -1535 ml   Filed Weights   03/24/19 2200 03/25/19 0506 03/26/19 0500  Weight: 83.5 kg 83.5 kg 83.5 kg    Examination:  General exam: Appears calm and comfortable  Respiratory  system: Respiratory effort normal. Cardiovascular system: S1 & S2 heard, RRR.  Gastrointestinal system: Abdomen is nondistended, soft and nontender. Central nervous system: Alert and oriented. No focal neurological deficits. Extremities: Symmetric 5 x 5 power. Pitting edema bl LE, 2+. Left leg with wound that is wrapped in gauze that is c/d/i. Left arm on shoulder sling.  Skin: No rashes, lesions or ulcers Psychiatry: Appropriate mood and affect     Data Reviewed: I have personally reviewed  following labs and imaging studies  CBC: Recent Labs  Lab 03/24/19 1418 03/25/19 0540 03/26/19 0529  WBC 7.0 5.8 6.3  NEUTROABS 4.8  --   --   HGB 9.5* 8.9* 9.5*  HCT 30.5* 29.0* 30.1*  MCV 102.0* 102.8* 102.0*  PLT 214 204 376   Basic Metabolic Panel: Recent Labs  Lab 03/24/19 1418 03/25/19 0540 03/26/19 0529  NA 143 142 141  K 4.0 4.0 3.9  CL 110 108 105  CO2 23 24 27   GLUCOSE 88 93 101*  BUN 45* 43* 45*  CREATININE 2.30* 2.23* 2.60*  CALCIUM 9.6 9.1 8.9  MG  --   --  2.3   GFR: Estimated Creatinine Clearance: 21.6 mL/min (A) (by C-G formula based on SCr of 2.6 mg/dL (H)). Liver Function Tests: Recent Labs  Lab 03/24/19 1418  AST 22  ALT 15  ALKPHOS 83  BILITOT 0.6  PROT 6.4*  ALBUMIN 3.4*   No results for input(s): LIPASE, AMYLASE in the last 168 hours. No results for input(s): AMMONIA in the last 168 hours. Coagulation Profile: No results for input(s): INR, PROTIME in the last 168 hours. Cardiac Enzymes: No results for input(s): CKTOTAL, CKMB, CKMBINDEX, TROPONINI in the last 168 hours. BNP (last 3 results) Recent Labs    01/29/19 1625  PROBNP 103   HbA1C: No results for input(s): HGBA1C in the last 72 hours. CBG: No results for input(s): GLUCAP in the last 168 hours. Lipid Profile: No results for input(s): CHOL, HDL, LDLCALC, TRIG, CHOLHDL, LDLDIRECT in the last 72 hours. Thyroid Function Tests: No results for input(s): TSH, T4TOTAL, FREET4, T3FREE, THYROIDAB in the last 72 hours. Anemia Panel: Recent Labs    03/26/19 0529  VITAMINB12 567  FERRITIN 136  TIBC 226*  IRON 44*   Sepsis Labs: No results for input(s): PROCALCITON, LATICACIDVEN in the last 168 hours.  Recent Results (from the past 240 hour(s))  SARS CORONAVIRUS 2 (TAT 6-24 HRS) Nasopharyngeal Nasopharyngeal Swab     Status: None   Collection Time: 03/24/19  5:51 PM   Specimen: Nasopharyngeal Swab  Result Value Ref Range Status   SARS Coronavirus 2 NEGATIVE NEGATIVE Final     Comment: (NOTE) SARS-CoV-2 target nucleic acids are NOT DETECTED. The SARS-CoV-2 RNA is generally detectable in upper and lower respiratory specimens during the acute phase of infection. Negative results do not preclude SARS-CoV-2 infection, do not rule out co-infections with other pathogens, and should not be used as the sole basis for treatment or other patient management decisions. Negative results must be combined with clinical observations, patient history, and epidemiological information. The expected result is Negative. Fact Sheet for Patients: SugarRoll.be Fact Sheet for Healthcare Providers: https://www.woods-mathews.com/ This test is not yet approved or cleared by the [place] FDA and  has been authorized for detection and/or diagnosis of SARS-CoV-2 by FDA under an Emergency Use Authorization (EUA). This EUA will remain  in effect (meaning this test can be used) for the duration of the COVID-19 declaration under Section 56 4(b)(1) of  the Act, 21 U.S.C. section 360bbb-3(b)(1), unless the authorization is terminated or revoked sooner. Performed at [facility], [place] [address], [place], [place] [address]          Radiology Studies: UE VENOUS DUPLEX [facility] 7 am - 7 pm)  Result Date: 03/24/2019 UPPER VENOUS STUDY  Indications: Swelling Risk Factors: Trauma Fracture. Comparison Study: No prior studies. Performing Technologist: Oliver Hum RVT  Examination Guidelines: A complete evaluation includes B-mode imaging, spectral Doppler, color Doppler, and power Doppler as needed of all accessible portions of each vessel. Bilateral testing is considered an integral part of a complete examination. Limited examinations for reoccurring indications may be performed as noted.  Right Findings: +----------+------------+---------+-----------+----------+-------+ RIGHT     CompressiblePhasicitySpontaneousPropertiesSummary  +----------+------------+---------+-----------+----------+-------+ Subclavian    Full       Yes       Yes                      +----------+------------+---------+-----------+----------+-------+  Left Findings: +----------+------------+---------+-----------+----------+-------+ LEFT      CompressiblePhasicitySpontaneousPropertiesSummary +----------+------------+---------+-----------+----------+-------+ IJV           Full       Yes       Yes                      +----------+------------+---------+-----------+----------+-------+ Subclavian    Full       Yes       Yes                      +----------+------------+---------+-----------+----------+-------+ Axillary      Full       Yes       Yes                      +----------+------------+---------+-----------+----------+-------+ Brachial      Full       Yes       Yes                      +----------+------------+---------+-----------+----------+-------+ Radial        Full                                          +----------+------------+---------+-----------+----------+-------+ Ulnar         Full                                          +----------+------------+---------+-----------+----------+-------+ Cephalic      Full                                          +----------+------------+---------+-----------+----------+-------+ Basilic       Full                                          +----------+------------+---------+-----------+----------+-------+  Summary:  Right: No evidence of thrombosis in the subclavian.  Left: No evidence of deep vein thrombosis in the upper extremity. No evidence of superficial vein thrombosis in the upper extremity.  *See table(s) above for measurements  and observations.  Diagnosing physician: Deitra Mayo MD Electronically signed by Deitra Mayo MD on 03/24/2019 at 6:42:29 PM.    Final         Scheduled Meds: . ARIPiprazole  2.5 mg Oral Daily  .  enoxaparin (LOVENOX) injection  30 mg Subcutaneous Q24H  . furosemide  40 mg Intravenous Daily  . l-methylfolate-B6-B12  1 tablet Oral Daily  . tranylcypromine  30 mg Oral BID   Continuous Infusions:   LOS: 2 days    Time spent: 27 minutes     Blain Pais, MD [facility]   If 7PM-7AM, please contact night-coverage [email or web address] Password [facility] 03/26/2019, 4:44 PM

## 2019-03-26 NOTE — Progress Notes (Signed)
Michael Barthel, MD paged regarding the OT's request for an Ortho consult d/t the left humerus fx and excessive swelling in the LUE. Pt says he sees Tamera Punt, MD for ortho concerns.

## 2019-03-26 NOTE — Evaluation (Signed)
Occupational Therapy Evaluation Patient Details Name: [name] MRN: [id number] DOB: [birth year] Today's Date: 03/26/2019    History of Present Illness 79 yo male admitted with acute CHF. Recent L shoulder fx after falling at home. Hx of CHF, CKD, HTN, osteoporosis, OA   Clinical Impression   Pt admitted with Acute CHF. Pt currently with functional limitations due to the deficits listed below (see OT Problem List).  Pt will benefit from skilled OT to increase their safety and independence with ADL and functional mobility for ADL to facilitate discharge to venue listed below.   Pts LUE ( hand/forearm and elbow) with significant edema.  RN aware.  Pt unsure if he is allowed to perform ROM LUE - RN to inquire with MD     Follow Up Recommendations  SNF;Home health OT;Supervision/Assistance - 24 hour    Equipment Recommendations  3 in 1 bedside commode    Recommendations for Other Services       Precautions / Restrictions Precautions Precautions: Fall Precaution Comments: L shoulder fx Required Braces or Orthoses: Sling Restrictions Weight Bearing Restrictions: Yes LUE Weight Bearing: Non weight bearing      Mobility Bed Mobility Overal bed mobility: Needs Assistance Bed Mobility: Supine to Sit     Supine to sit: Min assist;HOB elevated     General bed mobility comments: Assist to scoot to EOB. Pt relied on bedrail. Increased time.  Transfers Overall transfer level: Needs assistance Equipment used: 1 person hand held assist Transfers: Sit to/from [facility] Sit to Stand: From elevated surface;Mod assist Stand pivot transfers: Mod assist       General transfer comment: Assist to rise, stabilize, control descent. VCs safety, technique, hand placement. Unsteady    Balance Overall balance assessment: Needs assistance         Standing balance support: Single extremity supported Standing balance-Leahy Scale: Poor                             ADL either performed or assessed with clinical judgement   ADL Overall ADL's : Needs assistance/impaired Eating/Feeding: Minimal assistance;Sitting   Grooming: Minimal assistance;Sitting   Upper Body Bathing: Moderate assistance;Sitting   Lower Body Bathing: Maximal assistance;Sit to/from stand;Cueing for safety;Cueing for sequencing;Cueing for compensatory techniques   Upper Body Dressing : Moderate assistance;Sitting   Lower Body Dressing: Maximal assistance;Sit to/from stand;Cueing for safety;Cueing for sequencing;Cueing for compensatory techniques   Toilet Transfer: Moderate assistance;Stand-pivot;Cueing for safety;Cueing for sequencing;BSC   Toileting- Clothing Manipulation and Hygiene: Maximal assistance;Sit to/from stand         General ADL Comments: pt with significant edema L hand and forearm. RN aware. OT did adjust sling and educate pt in positining as well as edema control                  Pertinent Vitals/Pain Pain Score: 4  Pain Location: bil LEs, R foot, L shoulder Pain Descriptors / Indicators: Discomfort;Sore Pain Intervention(s): Limited activity within patient's tolerance;Monitored during session     Hand Dominance Left   Extremity/Trunk Assessment Upper Extremity Assessment Upper Extremity Assessment: LUE deficits/detail LUE Deficits / Details: s/p shoulder fracture.  Noted significant edema in L hand, forearm and around elbow area.  Educated pt on hand and wrist ROM as well as repositioned pt in sling.   RN aware of edema           Communication Communication Communication: No difficulties   Cognition Arousal/Alertness: Awake/alert Behavior  During Therapy: WFL for tasks assessed/performed Overall Cognitive Status: Within Functional Limits for tasks assessed                                                Home Living Family/patient expects to be discharged to:: Private residence Living Arrangements:  Spouse/significant other Available Help at Discharge: Family         Home Layout: One level     Bathroom Shower/Tub: Astronomer Accessibility: Yes   Home Equipment: Environmental consultant - 2 wheels          Prior Functioning/Environment Level of Independence: Independent with assistive device(s)        Comments: uses RW for ambulation        OT Problem List: Decreased strength;Decreased activity tolerance;Impaired balance (sitting and/or standing);Obesity;Impaired UE functional use;Increased edema;Decreased knowledge of use of DME or AE;Decreased knowledge of precautions      OT Treatment/Interventions: Self-care/ADL training;Patient/family education;DME and/or AE instruction;Therapeutic activities    OT Goals(Current goals can be found in the care plan section) Acute Rehab OT Goals Patient Stated Goal: to regain independence OT Goal Formulation: With patient Time For Goal Achievement: 04/02/19  OT Frequency: Min 2X/week   Barriers to D/C: Decreased caregiver support          Co-evaluation              AM-PAC OT "6 Clicks" Daily Activity     Outcome Measure Help from another person eating meals?: A Little Help from another person taking care of personal grooming?: A Little Help from another person toileting, which includes using toliet, bedpan, or urinal?: A Lot Help from another person bathing (including washing, rinsing, drying)?: A Lot Help from another person to put on and taking off regular upper body clothing?: A Lot Help from another person to put on and taking off regular lower body clothing?: A Lot 6 Click Score: 14   End of Session Equipment Utilized During Treatment: Other (comment)(L sling) Nurse Communication: Mobility status;Precautions  Activity Tolerance: Patient limited by fatigue Patient left: in chair;with nursing/sitter in room  OT Visit Diagnosis: Unsteadiness on feet (R26.81);Other abnormalities of gait and mobility  (R26.89);Repeated falls (R29.6);Muscle weakness (generalized) (M62.81);History of falling (Z91.81)                Time: 2707-8675 OT Time Calculation (min): 20 min Charges:  OT Evaluation $OT Eval Moderate Complexity: 1 Mod    Bhakti Labella, Thereasa Parkin 03/26/2019, 6:36 PM

## 2019-03-26 NOTE — Progress Notes (Signed)
Pt currently refusing CPAP QHS.  Pt to notify RT if he decides to wear.  RT to monitor and assess as needed.

## 2019-03-27 ENCOUNTER — Encounter (HOSPITAL_COMMUNITY): Payer: Self-pay | Admitting: Family Medicine

## 2019-03-27 LAB — BASIC METABOLIC PANEL
Anion gap: 11 (ref 5–15)
BUN: 47 mg/dL — ABNORMAL HIGH (ref 8–23)
CO2: 24 mmol/L (ref 22–32)
Calcium: 8.7 mg/dL — ABNORMAL LOW (ref 8.9–10.3)
Chloride: 102 mmol/L (ref 98–111)
Creatinine, Ser: 2.41 mg/dL — ABNORMAL HIGH (ref 0.61–1.24)
GFR calc Af Amer: 29 mL/min — ABNORMAL LOW (ref >60)
GFR calc non Af Amer: 25 mL/min — ABNORMAL LOW (ref >60)
Glucose, Bld: 102 mg/dL — ABNORMAL HIGH (ref 70–99)
Potassium: 3.6 mmol/L (ref 3.5–5.1)
Sodium: 137 mmol/L (ref 135–145)

## 2019-03-27 LAB — FOLATE RBC
Folate, Hemolysate: >620 ng/mL
Folate, RBC: >2199 ng/mL (ref >498)
Hematocrit: 28.2 % — ABNORMAL LOW (ref 37.5–51.0)

## 2019-03-27 LAB — CBC
HCT: 30.7 % — ABNORMAL LOW (ref 39.0–52.0)
Hemoglobin: 9.4 g/dL — ABNORMAL LOW (ref 13.0–17.0)
MCH: 31.8 pg (ref 26.0–34.0)
MCHC: 30.6 g/dL (ref 30.0–36.0)
MCV: 103.7 fL — ABNORMAL HIGH (ref 80.0–100.0)
Platelets: 213 10*3/uL (ref 150–400)
RBC: 2.96 MIL/uL — ABNORMAL LOW (ref 4.22–5.81)
RDW: 13.1 % (ref 11.5–15.5)
WBC: 6.1 10*3/uL (ref 4.0–10.5)
nRBC: 0 % (ref 0.0–0.2)

## 2019-03-27 NOTE — Progress Notes (Addendum)
  [name] [id number] 05-Apr-1940   Consulted about left shoulder.  Patient of my partner, Dr Tamera Punt, and has history of opposite side TSR.  [place] a couple of weeks ago and suffered greater tuberosity fracture on the left.  Reccommeded 6 weeks in a sling.  Was noted to have more swelling and ORS called.   PE: skin benign on left shoulder.  Mildly painful ROM. Moderate swelling all four extremeities  XRAY: films taken last night show no dislocation with same greater tub fx.  Same as in office  ASSESSMENT: left proximal humerus fracture mildly displaced  PLAN:  Needs to stay in sling.  Reapplied today.  No ROM or PT necessary at this time.  Will followup with Tamera Punt in a week.  Levora Dredge

## 2019-03-27 NOTE — Progress Notes (Signed)
Patient continues to decline nocturnal CPAP. RT will continue to follow.  

## 2019-03-27 NOTE — Progress Notes (Signed)
Occupational Therapy Treatment Patient Details Name: [name] MRN: [id number] DOB: April 15, 1940 Today's Date: 03/27/2019    History of present illness 79 yo male admitted with acute CHF. Recent L shoulder fx after falling at home. Hx of CHF, CKD, HTN, osteoporosis, OA   OT comments  Do not feel pt will be able to manage at home. Feel he will need ST SNF but pt is refusing this as an option.  He plans to call social services and have them send him some A.  OT explained SW would A with HH but do not feel this will be enough A   Follow Up Recommendations  SNF;Home health OT;Supervision/Assistance - 24 hour    Equipment Recommendations  3 in 1 bedside commode    Recommendations for Other Services      Precautions / Restrictions Precautions Precautions: Fall Precaution Comments: L shoulder fx Required Braces or Orthoses: Sling Restrictions Weight Bearing Restrictions: Yes LUE Weight Bearing: Non weight bearing       Mobility Bed Mobility Overal bed mobility: Needs Assistance Bed Mobility: Supine to Sit     Supine to sit: Min assist;HOB elevated     General bed mobility comments: Assist to scoot to EOB. Pt relied on bedrail. Increased time.  Transfers Overall transfer level: Needs assistance Equipment used: 1 person hand held assist Transfers: Sit to/from [facility] Sit to Stand: From elevated surface;Mod assist;Min assist Stand pivot transfers: Mod assist;Min assist       General transfer comment: min- mod A with transfer        ADL either performed or assessed with clinical judgement   ADL Overall ADL's : Needs assistance/impaired     Grooming: Standing;Minimal assistance;Cueing for safety;Wash/dry hands   Upper Body Bathing: Minimal assistance;Cueing for sequencing Upper Body Bathing Details (indicate cue type and reason): VC L UE in first             Toilet Transfer: Stand-pivot;Cueing for safety;Cueing for  sequencing;BSC;Minimal assistance Toilet Transfer Details (indicate cue type and reason): VC for hand placement Toileting- Clothing Manipulation and Hygiene: Moderate assistance;Sit to/from stand;Cueing for safety;Cueing for compensatory techniques Toileting - Clothing Manipulation Details (indicate cue type and reason): ( hygiene)       General ADL Comments: Pt with decreased edema L hand.  Pt stated he had been performing ROM as educated with hand and wrist. New sling ordered for pt as pt had urinated inbed and sling soaked in urine     Vision Patient Visual Report: No change from baseline            Cognition Arousal/Alertness: Awake/alert Behavior During Therapy: WFL for tasks assessed/performed Overall Cognitive Status: Within Functional Limits for tasks assessed                                                     Pertinent Vitals/ Pain       Pain Assessment: 0-10 Pain Score: 3  Pain Location: L shoulder Pain Descriptors / Indicators: Discomfort;Sore Pain Intervention(s): Limited activity within patient's tolerance;Repositioned;Monitored during session         Frequency  Min 2X/week        Progress Toward Goals  OT Goals(current goals can now be found in the care plan section)  Progress towards OT goals: Progressing toward goals     Plan Discharge plan  needs to be updated       AM-PAC OT "6 Clicks" Daily Activity     Outcome Measure   Help from another person eating meals?: A Little Help from another person taking care of personal grooming?: A Little Help from another person toileting, which includes using toliet, bedpan, or urinal?: A Lot Help from another person bathing (including washing, rinsing, drying)?: A Lot Help from another person to put on and taking off regular upper body clothing?: A Lot Help from another person to put on and taking off regular lower body clothing?: A Lot 6 Click Score: 14    End of Session Equipment  Utilized During Treatment: Other (comment)(L sling)  OT Visit Diagnosis: Unsteadiness on feet (R26.81);Other abnormalities of gait and mobility (R26.89);Repeated falls (R29.6);Muscle weakness (generalized) (M62.81);History of falling (Z91.81)   Activity Tolerance Patient tolerated treatment well   Patient Left in chair;with nursing/sitter in room   Nurse Communication Mobility status;Precautions        Time: 6384-6659 OT Time Calculation (min): 13 min  Charges: OT General Charges $OT Visit: 1 Visit OT Treatments $Self Care/Home Management : 8-22 mins  Kari Baars, [place] [phone] Office- [place] 03/27/2019, 1:46 PM

## 2019-03-27 NOTE — Progress Notes (Signed)
Progress Note    [name]  ELF:[id number] DOB: 1940/05/15  DOA: 03/24/2019 PCP: [name], MD      Brief Narrative:    Medical records reviewed and are as summarized below:  [name] is an 79 y.o. male with medical history significant of Diastolic congestive heart failure, chronic kidney disease stage IV, OSA on CPAP, bipolar disorder type I, and osteoporosis who presents with concerns of increasing shortness of breath. Patient fell on about 12/22 and suffered a fracture to his left proximal humerus.Since then he has stopped taking his torsemide due to increased urination and difficulty with changing his adult diaper. He has since noticed dyspnea with rest and with exertion for the past week.. Also notes increased lower extremity edema.      Assessment/Plan:   Active Problems:   Mild depressed bipolar I disorder (HCC)   CKD (chronic kidney disease), stage IV (HCC)   OSA (obstructive sleep apnea)   Proximal humerus fracture   Acute CHF (congestive heart failure) (HCC)   Anemia due to stage 4 chronic kidney disease (HCC)   Wound of left lower extremity   Weakness   Body mass index is 33.67 kg/m.    Acute diastolic heart failure: Continue IV Lasix.  Monitor daily weight, BMP and urine output.  Acute on chronic anemia: Hemoglobin is stable.  No indication for blood transfusion.  Left humerus fracture: Patient seen by orthopedic surgeon today.  Outpatient follow-up with Dr. Tamera Punt in 1 week recommended.  Bipolar disorder: Continue psychotropics  CKD stage IV: Creatinine is stable  OSA: Continue CPAP at night  Left leg wound: Continue local wound care.  Debility: PT recommends SNF versus home health therapy   Family Communication/Anticipated D/C date and plan/Code Status   DVT prophylaxis: Lovenox Code Status: Full code Family Communication: Plan discussed with patient Disposition Plan: Possible discharge to home  tomorrow      Subjective:   He states his legs are still swollen but breathing is a little better.  Objective:    Vitals:   03/26/19 1804 03/26/19 2016 03/27/19 0459 03/27/19 1240  BP:  114/72 114/68 116/68  Pulse:  86 77 80  Resp:  18 14 16   Temp:  97.6 F (36.4 C) 97.8 F (36.6 C) 98.9 F (37.2 C)  TempSrc:  Oral Oral Oral  SpO2: 97% 96% 94% 96%  Weight:      Height:        Intake/Output Summary (Last 24 hours) at 03/27/2019 1514 Last data filed at 03/27/2019 1409 Gross per 24 hour  Intake 1260 ml  Output 1700 ml  Net -440 ml   Filed Weights   03/24/19 2200 03/25/19 0506 03/26/19 0500  Weight: 83.5 kg 83.5 kg 83.5 kg    Exam:  GEN: NAD SKIN: Left leg wound (present on admission), no rash EYES: EOMI ENT: MMM CV: RRR PULM: CTA B ABD: soft, ND, NT, +BS CNS: AAO x 3, non focal EXT: b/l leg edema, no tenderness.  Left arm is tender and left arm in sling.   Data Reviewed:   I have personally reviewed following labs and imaging studies:  Labs: Labs show the following:   Basic Metabolic Panel: Recent Labs  Lab 03/24/19 1418 03/25/19 0540 03/26/19 0529 03/27/19 0439  NA 143 142 141 137  K 4.0 4.0 3.9 3.6  CL 110 108 105 102  CO2 23 24 27 24   GLUCOSE 88 93 101* 102*  BUN 45* 43* 45* 47*  CREATININE 2.30* 2.23* 2.60* 2.41*  CALCIUM 9.6 9.1 8.9 8.7*  MG  --   --  2.3  --    GFR Estimated Creatinine Clearance: 23.3 mL/min (A) (by C-G formula based on SCr of 2.41 mg/dL (H)). Liver Function Tests: Recent Labs  Lab 03/24/19 1418  AST 22  ALT 15  ALKPHOS 83  BILITOT 0.6  PROT 6.4*  ALBUMIN 3.4*   No results for input(s): LIPASE, AMYLASE in the last 168 hours. No results for input(s): AMMONIA in the last 168 hours. Coagulation profile No results for input(s): INR, PROTIME in the last 168 hours.  CBC: Recent Labs  Lab 03/24/19 1418 03/25/19 0540 03/26/19 0529 03/27/19 0439  WBC 7.0 5.8 6.3 6.1  NEUTROABS 4.8  --   --   --   HGB 9.5*  8.9* 9.5* 9.4*  HCT 30.5* 29.0* 30.1* 30.7*  MCV 102.0* 102.8* 102.0* 103.7*  PLT 214 204 217 213   Cardiac Enzymes: No results for input(s): CKTOTAL, CKMB, CKMBINDEX, TROPONINI in the last 168 hours. BNP (last 3 results) Recent Labs    01/29/19 1625  PROBNP 103   CBG: No results for input(s): GLUCAP in the last 168 hours. D-Dimer: No results for input(s): DDIMER in the last 72 hours. Hgb A1c: No results for input(s): HGBA1C in the last 72 hours. Lipid Profile: No results for input(s): CHOL, HDL, LDLCALC, TRIG, CHOLHDL, LDLDIRECT in the last 72 hours. Thyroid function studies: No results for input(s): TSH, T4TOTAL, T3FREE, THYROIDAB in the last 72 hours.  Invalid input(s): FREET3 Anemia work up: Recent Labs    03/26/19 0529  VITAMINB12 567  FERRITIN 136  TIBC 226*  IRON 44*   Sepsis Labs: Recent Labs  Lab 03/24/19 1418 03/25/19 0540 03/26/19 0529 03/27/19 0439  WBC 7.0 5.8 6.3 6.1    Microbiology Recent Results (from the past 240 hour(s))  SARS CORONAVIRUS 2 (TAT 6-24 HRS) Nasopharyngeal Nasopharyngeal Swab     Status: None   Collection Time: 03/24/19  5:51 PM   Specimen: Nasopharyngeal Swab  Result Value Ref Range Status   SARS Coronavirus 2 NEGATIVE NEGATIVE Final    Comment: (NOTE) SARS-CoV-2 target nucleic acids are NOT DETECTED. The SARS-CoV-2 RNA is generally detectable in upper and lower respiratory specimens during the acute phase of infection. Negative results do not preclude SARS-CoV-2 infection, do not rule out co-infections with other pathogens, and should not be used as the sole basis for treatment or other patient management decisions. Negative results must be combined with clinical observations, patient history, and epidemiological information. The expected result is Negative. Fact Sheet for Patients: SugarRoll.be Fact Sheet for Healthcare Providers: https://www.woods-mathews.com/ This test is not  yet approved or cleared by the [place] FDA and  has been authorized for detection and/or diagnosis of SARS-CoV-2 by FDA under an Emergency Use Authorization (EUA). This EUA will remain  in effect (meaning this test can be used) for the duration of the COVID-19 declaration under Section 56 4(b)(1) of the Act, 21 U.S.C. section 360bbb-3(b)(1), unless the authorization is terminated or revoked sooner. Performed at [facility], [place] [address] [place], [name] [address]     Procedures and diagnostic studies:  DG Shoulder Left  Result Date: 03/26/2019 CLINICAL DATA:  Left shoulder pain with known fracture. EXAM: LEFT SHOULDER - 2+ VIEW COMPARISON:  None. FINDINGS: There is a proximal humerus fracture involving the greater tuberosity with less than 1 cm of displacement. There is no dislocation. Mild acromioclavicular osteoarthrosis is noted.  IMPRESSION: Minimally displaced proximal humerus fracture as above. Electronically Signed   By: Logan Bores M.D.   On: 03/26/2019 19:08    Medications:   . ARIPiprazole  2.5 mg Oral Daily  . enoxaparin (LOVENOX) injection  30 mg Subcutaneous Q24H  . furosemide  40 mg Intravenous Daily  . l-methylfolate-B6-B12  1 tablet Oral Daily  . tranylcypromine  30 mg Oral BID   Continuous Infusions:   LOS: 3 days   Maralyn Witherell  [facility]   *Please refer to [email or web address], password [email or web address] to get updated schedule on who will round on this patient, as hospitalists switch teams weekly. If 7PM-7AM, please contact night-coverage at [email or web address], password [email or web address] for any overnight needs.  03/27/2019, 3:14 PM

## 2019-03-27 NOTE — TOC Progression Note (Signed)
Transition of Care [facility]) - Progression Note    Patient Details  Name: [name] MRN: [id number] Date of Birth: 19-Oct-1940  Transition of Care [facility]) CM/SW Contact  Jakeem Grape, Juliann Pulse, RN Phone Number: 03/27/2019, 3:10 PM  Clinical Narrative:  Checking with [place] agencies to accept. Await HHPT HHOT cane quad vs straight angle cane orders.     Expected Discharge Plan: [place] Barriers to Discharge: Continued Medical Work up  Expected Discharge Plan and Services Expected Discharge Plan: [place]   Discharge Planning Services: CM Consult   Living arrangements for the past 2 months: Single Family Home                                       Social Determinants of Health (SDOH) Interventions    Readmission Risk Interventions No flowsheet data found.

## 2019-03-27 NOTE — TOC Progression Note (Signed)
Transition of Care [facility]) - Progression Note    Patient Details  Name: [name] MRN: [id number] Date of Birth: 09-Feb-1941  Transition of Care [facility]) CM/SW Contact  Theophilus Walz, Juliann Pulse, RN Phone Number: 03/27/2019, 3:42 PM  Clinical Narrative:[facility] able to accept patient for [facility] HHPT/OT;cane- [facility] to be notified once confirmed per PT quad cane vs SAC.       Expected Discharge Plan: [place] Barriers to Discharge: Continued Medical Work up  Expected Discharge Plan and Services Expected Discharge Plan: [place]   Discharge Planning Services: CM Consult   Living arrangements for the past 2 months: Single Family Home                           HH Arranged: PT, OT [facility] Agency: [place] Date [place]: 03/27/19 Time [place]: 0712 Representative spoke with at [place]: [place] ([place]) Interventions    Readmission Risk Interventions No flowsheet data found.

## 2019-03-28 ENCOUNTER — Encounter (HOSPITAL_BASED_OUTPATIENT_CLINIC_OR_DEPARTMENT_OTHER): Payer: Medicare Other | Admitting: Physician Assistant

## 2019-03-28 DIAGNOSIS — D631 Anemia in chronic kidney disease: Secondary | ICD-10-CM

## 2019-03-28 DIAGNOSIS — R531 Weakness: Secondary | ICD-10-CM

## 2019-03-28 DIAGNOSIS — I5031 Acute diastolic (congestive) heart failure: Secondary | ICD-10-CM

## 2019-03-28 DIAGNOSIS — G4733 Obstructive sleep apnea (adult) (pediatric): Secondary | ICD-10-CM

## 2019-03-28 DIAGNOSIS — N184 Chronic kidney disease, stage 4 (severe): Secondary | ICD-10-CM

## 2019-03-28 DIAGNOSIS — S42202D Unspecified fracture of upper end of left humerus, subsequent encounter for fracture with routine healing: Secondary | ICD-10-CM

## 2019-03-28 DIAGNOSIS — F3131 Bipolar disorder, current episode depressed, mild: Secondary | ICD-10-CM

## 2019-03-28 LAB — BASIC METABOLIC PANEL
Anion gap: 9 (ref 5–15)
BUN: 57 mg/dL — ABNORMAL HIGH (ref 8–23)
CO2: 28 mmol/L (ref 22–32)
Calcium: 8.8 mg/dL — ABNORMAL LOW (ref 8.9–10.3)
Chloride: 104 mmol/L (ref 98–111)
Creatinine, Ser: 2.44 mg/dL — ABNORMAL HIGH (ref 0.61–1.24)
GFR calc Af Amer: 28 mL/min — ABNORMAL LOW (ref >60)
GFR calc non Af Amer: 24 mL/min — ABNORMAL LOW (ref >60)
Glucose, Bld: 107 mg/dL — ABNORMAL HIGH (ref 70–99)
Potassium: 3.7 mmol/L (ref 3.5–5.1)
Sodium: 141 mmol/L (ref 135–145)

## 2019-03-28 LAB — CBC
HCT: 30.3 % — ABNORMAL LOW (ref 39.0–52.0)
Hemoglobin: 9.5 g/dL — ABNORMAL LOW (ref 13.0–17.0)
MCH: 32.1 pg (ref 26.0–34.0)
MCHC: 31.4 g/dL (ref 30.0–36.0)
MCV: 102.4 fL — ABNORMAL HIGH (ref 80.0–100.0)
Platelets: 236 10*3/uL (ref 150–400)
RBC: 2.96 MIL/uL — ABNORMAL LOW (ref 4.22–5.81)
RDW: 13.2 % (ref 11.5–15.5)
WBC: 6.5 10*3/uL (ref 4.0–10.5)
nRBC: 0 % (ref 0.0–0.2)

## 2019-03-28 MED ORDER — TORSEMIDE 20 MG PO TABS: 20.0000 mg | ORAL_TABLET | Freq: Every day | ORAL | 0 refills | Status: DC

## 2019-03-28 NOTE — Progress Notes (Signed)
Pt provided with d/c instructions. After discussing the pt's plan of care upon d/c home, the pt reported no further questions or concerns.

## 2019-03-28 NOTE — Care Management Important Message (Signed)
Important Message  Patient Details IM Letter given to Dessa Phi RN Case Manager to present to the Patient Name: [name] MRN: [id number] Date of Birth: 1940-09-20   Medicare Important Message Given:  Yes     Kerin Salen 03/28/2019, 12:53 PM

## 2019-03-28 NOTE — Discharge Instructions (Signed)
Humerus Fracture Treated With Immobilization  A humerus fracture is a break in the large bone in the upper arm (humerus). If the joint is stable and the bones are still in their normal position (nondisplaced), the injury may be treated with immobilization. This involves the use of a cast, splint, or sling to hold your arm in place. Immobilization ensures that your bones continue to stay in the correct position while your arm is healing. What are the causes? This condition may be caused by:  A fall.  A hard, direct hit to the arm.  A motor vehicle accident. What increases the risk? The following factors may make you more likely to develop this condition:  Being elderly.  Having a disease that makes the bones thin and weak. What are the signs or symptoms? Symptoms of this condition include:  Pain.  Swelling.  Bruising.  Not being able to move your arm normally. How is this diagnosed? This condition may be diagnosed based on:  A physical exam.  X-rays of your upper arm, elbow, and shoulder.  CT scan. How is this treated? Treatment for this condition involves wearing a cast, splint, or sling until the injured area is stable enough for you to begin range-of-motion exercises. You may also be prescribed pain medicine. Follow these instructions at home: If you have a cast:  Do not stick anything inside the cast to scratch your skin. Doing that increases your risk of infection.  Check the skin around the cast every day. Tell your health care provider about any concerns.  You may put lotion on dry skin around the edges of the cast. Do not put lotion on the skin underneath the cast.  Keep the cast clean and dry. If you have a splint or sling:  Wear the splint or sling as told by your health care provider. Remove it only as told by your health care provider.  Loosen the splint or sling if your fingers tingle, become numb, or turn cold and blue.  Keep the splint or sling clean  and dry. Bathing  Do not take baths, swim, or use a hot tub until your health care provider approves. Ask your health care provider if you may take showers. You may only be allowed to take sponge baths.  If the cast, splint, or sling is not waterproof: ? Do not let it get wet. ? Cover it with a watertight covering when you take a bath or shower.  If you have a sling, remove it for bathing only if your health care provider tells you that it is safe to do that. Managing pain, stiffness, and swelling   If directed, put ice on the injured area. ? If you have a removable splint or sling, remove it as told by your health care provider. ? Put ice in a plastic bag. ? Place a towel between your skin and the bag or between your cast and the bag. ? Leave the ice on for 20 minutes, 2-3 times a day.  Move your fingers often to reduce stiffness and swelling.  Raise (elevate) the injured area above the level of your heart while you are sitting or lying down. Driving  Do not drive or use heavy machinery while taking prescription pain medicine.  Do not drive while wearing a cast, splint, or sling on an arm that you use for driving. Ask your health care provider when it is safe to drive. Activity  Return to your normal activities as told by your  health care provider. Ask your health care provider what activities are safe for you.  Do not lift anything until your health care provider says that it is safe.  Do range-of-motion exercises only as told by your health care provider or physical therapist. General instructions  Do not put pressure on any part of the cast or splint until it is fully hardened. This may take several hours.  Do not use any products that contain nicotine or tobacco, such as cigarettes, e-cigarettes, and chewing tobacco. These can delay bone healing. If you need help quitting, ask your health care provider.  Take over-the-counter and prescription medicines only as told by  your health care provider.  Ask your health care provider if the medicine prescribed to you can cause constipation. You may need to take steps to prevent or treat constipation, such as: ? Drink enough fluid to keep your urine pale yellow. ? Take over-the-counter or prescription medicines. ? Eat foods that are high in fiber, such as beans, whole grains, and fresh fruits and vegetables. ? Limit foods that are high in fat and processed sugars, such as fried or sweet foods.  Keep all follow-up visits as told by your health care provider. This is important. Contact a health care provider if:  You have any new pain, swelling, or bruising.  Your pain, swelling, and bruising do not improve.  Your cast, splint, or sling becomes loose or damaged. Get help right away if:  Your skin or fingers on your injured arm turn blue or gray.  Your arm feels cold or numb.  You have severe pain in your injured arm. Summary  A humerus fracture is a break in the large bone in the upper arm.  Immobilization involves the use of a cast, splint, or sling to hold your arm in place while the injury heals.  Wear a splint or sling as told by your health care provider. Remove it only as told by your health care provider.  Move your fingers often to reduce stiffness and swelling. This information is not intended to replace advice given to you by your health care provider. Make sure you discuss any questions you have with your health care provider. Document Revised: 11/07/2017 Document Reviewed: 11/07/2017 Elsevier Patient Education  2020 [place]. Preventing Heart Failure Heart failure is a condition in which the heart has trouble pumping blood because it has become weak or stiff. This may mean that the heart cannot pump enough blood out to the body or that the heart does not fill up with enough blood. Either of those problems can lead to symptoms such as tiredness (fatigue), trouble breathing, and swelling  throughout the body. This is a common medical condition that affects not only the heart, but the entire body. Making certain nutrition and lifestyle changes can help you prevent heart failure and avoid serious health problems. How can this condition affect me? Heart failure can cause very serious problems that may get worse over time, such as:  Extreme fatigue during normal physical activities.  Shortness of breath or trouble breathing.  Swelling in your abdomen, legs, ankles, feet, or neck.  Fluid buildup throughout the body.  Weight gain.  Cough.  Frequent urination. What can increase my risk? The risk of heart failure increases as a person ages. The following factors may also make you more likely to develop this condition:  Being overweight.  Being male.  Smoking or chewing tobacco.  Abusing alcohol or drugs.  Having taken medicines that  can damage the heart, such as chemotherapy drugs.  Having any of these medical conditions: ? Diabetes. ? Abnormal heart rhythms. ? Thyroid problems. ? Low blood counts (anemia). What actions can I take to prevent heart failure?     Nutrition  If you are overweight or obese, reduce how many calories you eat each day so that you lose weight. Work with your health care provider or a diet and nutrition specialist (dietitian) to determine how many calories you need each day.  Eat foods that are low in salt (sodium). Avoid adding extra salt to foods. This can help keep your blood pressure in a normal range.  Eat a well-balanced diet that includes a lot of: ? Fresh fruits and vegetables. ? Whole grains. ? Lean meats. ? Beans. ? Fat-free or low-fat dairy products.  Avoid foods that contain a lot of: ? Trans fats. ? Saturated fats. ? Sugar. ? Cholesterol. Alcohol  Do not drink alcohol if: ? Your health care provider tells you not to drink. ? You are pregnant, may be pregnant, or are planning to become pregnant.  If you drink  alcohol: ? Limit how much you use to:  0-1 drink a day for women.  0-2 drinks a day for men. ? Be aware of how much alcohol is in your drink. In the [place] one drink equals one 12 oz bottle of beer (355 mL), one 5 oz glass of wine (148 mL), or one 1 oz glass of hard liquor (44 mL). Lifestyle  Do not use any products that contain nicotine or tobacco, such as cigarettes, e-cigarettes, and chewing tobacco. If you need help quitting, ask your health care provider.  Exercise for at least 150 minutes each week, or as much as told by your health care provider. ? Do moderate-intensity exercise, such as brisk walking, bicycling, or water aerobics. ? Ask your health care provider which activities are safe for you.  Try to get 7-9 hours of sleep each night. To help with sleep: ? Keep your bedroom cool and dark. ? Do not eat a heavy meal during the hour before you go to bed. ? Do not drink alcohol or caffeinated drinks before bed. ? Avoid screen time before bedtime. This means avoiding television, computers, tablets, and mobile phones.  Find ways to relax and manage stress. These may include: ? Breathing exercises. ? Meditation. ? Yoga. ? Listening to music. General instructions  See a health care provider regularly for screening and wellness checks. Work with your health care provider to manage your: ? Blood pressure. ? Cholesterol levels. ? Blood sugar (glucose) levels. ? Weight and BMI.  If you have diabetes, manage your condition and follow your treatment plan as instructed. Where to find more information  National Heart, Lung, and Blood Institute: https://wilson-eaton.com/  Centers for Disease Control and Prevention: http://www.wolf.info/  National Institute on Aging: http://kim-miller.com/  American Heart Association: www.heart.org Contact a health care provider if:  You have rapid weight gain.  You have increasing shortness of breath that is unusual for you.  You tire easily or you are unable to  participate in your usual activities.  You cough more than normal, especially with physical activity.  You have any swelling or more swelling in areas such as your hands, feet, ankles, or abdomen. Get help right away if you have:  Trouble breathing.  Pain or discomfort in your chest.  An episode of fainting. These symptoms may represent a serious problem that is an emergency. Do not  wait to see if the symptoms will go away. Get medical help right away. Call your local emergency services (911 in the [place] Do not drive yourself to the hospital. Summary  Heart failure can cause very serious problems over time.  Heart failure can be prevented by making changes to your diet and your lifestyle.  It is important to eat a healthy diet, manage your weight, exercise regularly, manage stress, avoid drugs and alcohol, and keep your cholesterol and blood pressure under control. This information is not intended to replace advice given to you by your health care provider. Make sure you discuss any questions you have with your health care provider. Document Revised: 10/06/2018 Document Reviewed: 10/06/2018 Elsevier Patient Education  [place]. Heart Failure, Self Care Heart failure is a serious condition. This sheet explains things you need to do to take care of yourself at home. To help you stay as healthy as possible, you may be asked to change your diet, take certain medicines, and make other changes in your life. Your doctor may also give you more specific instructions. If you have problems or questions, call your doctor. What are the risks? Having heart failure makes it more likely for you to have some problems. These problems can get worse if you do not take good care of yourself. Problems may include:  Blood clotting problems. This may cause a stroke.  Damage to the kidneys, liver, or lungs.  Abnormal heart rhythms. Supplies needed:  Scale for weighing yourself.  Blood pressure  monitor.  Notebook.  Medicines. How to care for yourself when you have heart failure Medicines Take over-the-counter and prescription medicines only as told by your doctor. Take your medicines every day.  Do not stop taking your medicine unless your doctor tells you to do so.  Do not skip any medicines.  Get your prescriptions refilled before you run out of medicine. This is important. Eating and drinking   Eat heart-healthy foods. Talk with a diet specialist (dietitian) to create an eating plan.  Choose foods that: ? Have no trans fat. ? Are low in saturated fat and cholesterol.  Choose healthy foods, such as: ? Fresh or frozen fruits and vegetables. ? Fish. ? Low-fat (lean) meats. ? Legumes, such as beans, peas, and lentils. ? Fat-free or low-fat dairy products. ? Whole-grain foods. ? High-fiber foods.  Limit salt (sodium) if told by your doctor. Ask your diet specialist to tell you which seasonings are healthy for your heart.  Cook in healthy ways instead of frying. Healthy ways of cooking include roasting, grilling, broiling, baking, poaching, steaming, and stir-frying.  Limit how much fluid you drink, if told by your doctor. Alcohol use  Do not drink alcohol if: ? Your doctor tells you not to drink. ? Your heart was damaged by alcohol, or you have very bad heart failure. ? You are pregnant, may be pregnant, or are planning to become pregnant.  If you drink alcohol: ? Limit how much you use to:  0-1 drink a day for women.  0-2 drinks a day for men. ? Be aware of how much alcohol is in your drink. In the [place] one drink equals one 12 oz bottle of beer (355 mL), one 5 oz glass of wine (148 mL), or one 1 oz glass of hard liquor (44 mL). Lifestyle   Do not use any products that contain nicotine or tobacco, such as cigarettes, e-cigarettes, and chewing tobacco. If you need help quitting, ask your doctor. ?  Do not use nicotine gum or patches before talking to  your doctor.  Do not use illegal drugs.  Lose weight if told by your doctor.  Do physical activity if told by your doctor. Talk to your doctor before you begin an exercise if: ? You are an older adult. ? You have very bad heart failure.  Learn to manage stress. If you need help, ask your doctor.  Get rehab (rehabilitation) to help you stay independent and to help with your quality of life.  Plan time to rest when you get tired. Check weight and blood pressure   Weigh yourself every day. This will help you to know if fluid is building up in your body. ? Weigh yourself every morning after you pee (urinate) and before you eat breakfast. ? Wear the same amount of clothing each time. ? Write down your daily weight. Give your record to your doctor.  Check and write down your blood pressure as told by your doctor.  Check your pulse as told by your doctor. Dealing with very hot and very cold weather  If it is very hot: ? Avoid activities that take a lot of energy. ? Use air conditioning or fans, or find a cooler place. ? Avoid caffeine and alcohol. ? Wear clothing that is loose-fitting, lightweight, and light-colored.  If it is very cold: ? Avoid activities that take a lot of energy. ? Layer your clothes. ? Wear mittens or gloves, a hat, and a scarf when you go outside. ? Avoid alcohol. Follow these instructions at home:  Stay up to date with shots (vaccines). Get pneumococcal and flu (influenza) shots.  Keep all follow-up visits as told by your doctor. This is important. Contact a doctor if:  You gain weight quickly.  You have increasing shortness of breath.  You cannot do your normal activities.  You get tired easily.  You cough a lot.  You don't feel like eating or feel like you may vomit (nauseous).  You become puffy (swell) in your hands, feet, ankles, or belly (abdomen).  You cannot sleep well because it is hard to breathe.  You feel like your heart is  beating fast (palpitations).  You get dizzy when you stand up. Get help right away if:  You have trouble breathing.  You or someone else notices a change in your behavior, such as having trouble staying awake.  You have chest pain or discomfort.  You pass out (faint). These symptoms may be an emergency. Do not wait to see if the symptoms will go away. Get medical help right away. Call your local emergency services (911 in the [place] Do not drive yourself to the hospital. Summary  Heart failure is a serious condition. To care for yourself, you may have to change your diet, take medicines, and make other lifestyle changes.  Take your medicines every day. Do not stop taking them unless your doctor tells you to do so.  Eat heart-healthy foods, such as fresh or frozen fruits and vegetables, fish, lean meats, legumes, fat-free or low-fat dairy products, and whole-grain or high-fiber foods.  Ask your doctor if you can drink alcohol. You may have to stop alcohol use if you have very bad heart failure.  Contact your doctor if you gain weight quickly or feel that your heart is beating too fast. Get help right away if you pass out, or have chest pain or trouble breathing. This information is not intended to replace advice given to you by  your health care provider. Make sure you discuss any questions you have with your health care provider. Document Revised: 06/20/2018 Document Reviewed: 06/21/2018 Elsevier Patient Education  [place]. Heart Failure, Diagnosis  Heart failure means that your heart is not able to pump blood in the right way. This makes it hard for your body to work well. Heart failure is usually a long-term (chronic) condition. You must take good care of yourself and follow your treatment plan from your doctor. What are the causes? This condition may be caused by:  High blood pressure.  Build up of cholesterol and fat in the arteries.  Heart attack. This injures the  heart muscle.  Heart valves that do not open and close properly.  Damage of the heart muscle. This is also called cardiomyopathy.  Lung disease.  Abnormal heart rhythms. What increases the risk? The risk of heart failure goes up as a person ages. This condition is also more likely to develop in people who:  Are overweight.  Are male.  Smoke or chew tobacco.  Abuse alcohol or illegal drugs.  Have taken medicines that can damage the heart.  Have diabetes.  Have abnormal heart rhythms.  Have thyroid problems.  Have low blood counts (anemia). What are the signs or symptoms? Symptoms of this condition include:  Shortness of breath.  Coughing.  Swelling of the feet, ankles, legs, or belly.  Losing weight for no reason.  Trouble breathing.  Waking from sleep because of the need to sit up and get more air.  Rapid heartbeat.  Being very tired.  Feeling dizzy, or feeling like you may pass out (faint).  Having no desire to eat.  Feeling like you may vomit (nauseous).  Peeing (urinating) more at night.  Feeling confused. How is this treated?     This condition may be treated with:  Medicines. These can be given to treat blood pressure and to make the heart muscles stronger.  Changes in your daily life. These may include eating a healthy diet, staying at a healthy body weight, quitting tobacco and illegal drug use, or doing exercises.  Surgery. Surgery can be done to open blocked valves, or to put devices in the heart, such as pacemakers.  A donor heart (heart transplant). You will receive a healthy heart from a donor. Follow these instructions at home:  Treat other conditions as told by your doctor. These may include high blood pressure, diabetes, thyroid disease, or abnormal heart rhythms.  Learn as much as you can about heart failure.  Get support as you need it.  Keep all follow-up visits as told by your doctor. This is important. Summary  Heart  failure means that your heart is not able to pump blood in the right way.  This condition is caused by high blood pressure, heart attack, or damage of the heart muscle.  Symptoms of this condition include shortness of breath and swelling of the feet, ankles, legs, or belly. You may also feel very tired or feel like you may vomit.  You may be treated with medicines, surgery, or changes in your daily life.  Treat other health conditions as told by your doctor. This information is not intended to replace advice given to you by your health care provider. Make sure you discuss any questions you have with your health care provider. Document Revised: 05/26/2018 Document Reviewed: 05/26/2018 Elsevier Patient Education  [place].

## 2019-03-28 NOTE — Progress Notes (Signed)
Physical Therapy Treatment Patient Details Name: [name] MRN: [id number] DOB: [birth year] Today's Date: 03/28/2019    History of Present Illness 79 yo male admitted with acute CHF. Recent L shoulder fx after falling at home. Hx of CHF, CKD, HTN, osteoporosis, OA    PT Comments    Progressing with mobility. Pt practiced gait training with straight cane vs quad cane. He didn't feel any less steady using the straight cane. He chose to take the straight cane for ambulation. Encouraged pt to have close guarding/assistance from girlfriend for safety at least for a few days-he is agreeable. Plan is for possible d/c home later today. He prefers d/c home with HHPT instead of [facility] rehab.     Follow Up Recommendations  Home health PT;Supervision/Assistance - 24 hour(pt stated his girlfriend "[name]" will be around to help)     Equipment Recommendations  Cane    Recommendations for Other Services       Precautions / Restrictions Precautions Precautions: Fall Precaution Comments: L shoulder fx Required Braces or Orthoses: Sling Restrictions Weight Bearing Restrictions: Yes LUE Weight Bearing: Non weight bearing    Mobility  Bed Mobility               General bed mobility comments: oob in recliner  Transfers Overall transfer level: Needs assistance Equipment used: Quad cane;Straight cane Transfers: Sit to/from Stand Sit to Stand: Min guard         General transfer comment: Increased time. Close guard for safety.  Ambulation/Gait Ambulation/Gait assistance: Min assist;Min guard Gait Distance (Feet): 130 Feet Assistive device: Quad cane;Straight cane Gait Pattern/deviations: Step-to pattern     General Gait Details: Pt usually needs a steadying assistance for first 10 feet of ambulation distance then he improves to close guard level. He walked ~65 feet with the quad cane and then another 65 feet with the straight cane. No real discernable difference in stability  between this two. Pt stated he didn't feel any less steady either. Dyspnea 2/4   Stairs             Wheelchair Mobility    Modified Rankin (Stroke Patients Only)       Balance Overall balance assessment: Needs assistance         Standing balance support: Single extremity supported Standing balance-Leahy Scale: Poor                              Cognition Arousal/Alertness: Awake/alert Behavior During Therapy: WFL for tasks assessed/performed Overall Cognitive Status: Within Functional Limits for tasks assessed                                        Exercises      General Comments        Pertinent Vitals/Pain Pain Assessment: Faces Faces Pain Scale: Hurts little more Pain Location: L ankle, L shoulder Pain Descriptors / Indicators: Discomfort;Sore;Aching Pain Intervention(s): Monitored during session;Repositioned    Home Living                      Prior Function            PT Goals (current goals can now be found in the care plan section) Progress towards PT goals: Progressing toward goals    Frequency    Min 3X/week  PT Plan Current plan remains appropriate    Co-evaluation              AM-PAC PT "6 Clicks" Mobility   Outcome Measure  Help needed turning from your back to your side while in a flat bed without using bedrails?: A Little Help needed moving from lying on your back to sitting on the side of a flat bed without using bedrails?: A Little Help needed moving to and from a bed to a chair (including a wheelchair)?: A Little Help needed standing up from a chair using your arms (e.g., wheelchair or bedside chair)?: A Little Help needed to walk in hospital room?: A Little Help needed climbing 3-5 steps with a railing? : A Lot 6 Click Score: 17    End of Session Equipment Utilized During Treatment: Gait belt(sling) Activity Tolerance: Patient tolerated treatment well Patient left: in  chair;with call bell/phone within reach;with chair alarm set   PT Visit Diagnosis: History of falling (Z91.81);Unsteadiness on feet (R26.81);Muscle weakness (generalized) (M62.81);Difficulty in walking, not elsewhere classified (R26.2)     Time: 4098-1191 PT Time Calculation (min) (ACUTE ONLY): 27 min  Charges:  $Gait Training: 23-37 mins                        Doreatha Massed, PT Acute Rehabilitation

## 2019-03-28 NOTE — TOC Transition Note (Signed)
Transition of Care [facility]) - CM/SW Discharge Note   Patient Details  Name: [name] MRN: [id number] Date of Birth: 08-Nov-1940  Transition of Care [facility]) CM/SW Contact:  Dessa Phi, RN Phone Number: 03/28/2019, 11:25 AM   Clinical Narrative:d/c home w/HHC-[facility] rep [facility] aware. [place] dme rep [employer]-deliver SAC to rm, & 3n1 prior d/c. No further CM needs.       Final next level of care: [place] Barriers to Discharge: No Barriers Identified   Patient Goals and CMS Choice Patient states their goals for this hospitalization and ongoing recovery are:: go home CMS Medicare.gov Compare Post Acute Care list provided to:: Patient Choice offered to / list presented to : Patient  Discharge Placement                       Discharge Plan and Services   Discharge Planning Services: CM Consult            DME Arranged: Kasandra Knudsen, 3-N-1 DME Agency: [employer] Date DME Agency Contacted: 03/28/19 Time DME Agency Contacted: [id number] Representative spoke with at DME Agency: Thedore Mins HH Arranged: PT, OT [place] Agency: [place] Date [place]: 03/27/19 Time [place]: [place] Representative spoke with at [place]: [place] ([place]) Interventions     Readmission Risk Interventions No flowsheet data found.

## 2019-03-28 NOTE — Progress Notes (Signed)
Pt denies having a legal guardian. A name is not listed for the legal guardian. Pt stated it was a mistake.   Pt refused DME 3 in 1 BSC when equipment was delivered. Pt recieved cane.

## 2019-03-28 NOTE — Discharge Summary (Signed)
Physician Discharge Summary  [name] JGG:[id number] DOB: 1940/08/01 DOA: 03/24/2019  PCP: [name], MD  Admit date: 03/24/2019 Discharge date: 03/28/2019  Admitted From: Home Disposition:  Home  Recommendations for Outpatient Follow-up:  1. Follow up with PCP in 1-2 weeks 2. Follow-up with orthopedics, Dr. Tamera Punt in 1 week for follow-up of humerus fracture 3. Encouraged to monitor daily weights and to ensure he takes his torsemide daily 4. Please obtain BMP in one week  Home Health: Yes, RN/aide/PT/OT Equipment/Devices: Cane, 3 and 1 bedside commode  Discharge Condition: Stable CODE STATUS: Full code Diet recommendation: Heart healthy diet  History of present illness:  [name] is an 79 y.o. male with medical history significant ofdiastolic congestive heart failure, chronic kidney disease stage IV, OSA on CPAP, bipolar disorder type I, and osteoporosis who presents with concerns of increasing shortness of breath.  Patient fell on about 12/22 and suffered a fracture to his left proximal humerus.Since then he has stopped taking his torsemide due to increased urination and difficulty with changing his adult diaper. He has since noticed dyspnea with rest and with exertion for the past week.. Also notes increased lower extremity edema.  Hospital course:  Acute on chronic diastolic congestive heart failure exacerbation Patient presenting from home with increased dyspnea, secondary to medication noncompliance as he has not been taking his home torsemide.  Patient was started on IV furosemide with good diuresis.  Patient was net negative 4.0 L during hospitalization.  Left humerus fracture Continue immobilization in sling.  Follow-up with orthopedics, Dr. Tamera Punt in 1 week following discharge.  Bipolar disorder: continue Abilify 2.5mg  PO daily  CKD stage IV Creatinine stable, 2.44 at time of discharge.  Recommend repeat BMP 1 week.  Anemia of chronic disease,  likely secondary to CKD Hemoglobin stable.  Continue outpatient follow-up with PCP.  OSA: Continue nocturnal CPAP  Debility, weakness, gait disturbance: Patient evaluated by PT OT with recommendations for home health.  Will discharge home with 3 and 1 bedside commode and cane.  And home health services with PT/OT/RN and aide.  Discharge Diagnoses:  Active Problems:   Mild depressed bipolar I disorder (HCC)   CKD (chronic kidney disease), stage IV (HCC)   OSA (obstructive sleep apnea)   Proximal humerus fracture   Acute CHF (congestive heart failure) (HCC)   Anemia due to stage 4 chronic kidney disease (HCC)   Wound of left lower extremity   Weakness    Discharge Instructions  Discharge Instructions    (HEART FAILURE PATIENTS) Call MD:  Anytime you have any of the following symptoms: 1) 3 pound weight gain in 24 hours or 5 pounds in 1 week 2) shortness of breath, with or without a dry hacking cough 3) swelling in the hands, feet or stomach 4) if you have to sleep on extra pillows at night in order to breathe.   Complete by: As directed    Call MD for:  difficulty breathing, headache or visual disturbances   Complete by: As directed    Call MD for:  extreme fatigue   Complete by: As directed    Call MD for:  persistant dizziness or light-headedness   Complete by: As directed    Call MD for:  persistant nausea and vomiting   Complete by: As directed    Call MD for:  severe uncontrolled pain   Complete by: As directed    Call MD for:  temperature >100.4   Complete by: As directed  Diet - low sodium heart healthy   Complete by: As directed    Face-to-face encounter (required for Medicare/Medicaid patients)   Complete by: As directed    I [name] certify that this patient is under my care and that I, or a nurse practitioner or physician's assistant working with me, had a face-to-face encounter that meets the physician face-to-face encounter requirements with this patient  on 03/27/2019. The encounter with the patient was in whole, or in part for the following medical condition(s) which is the primary reason for home health care (List medical condition):Acute CHF, left humerus fracture   The encounter with the patient was in whole, or in part, for the following medical condition, which is the primary reason for home health care: Acute CHF, left humerus fracture   I certify that, based on my findings, the following services are medically necessary home health services: Physical therapy   Reason for Medically Necessary Home Health Services: Therapy- Instruction on use of Assistive Device for Ambulation on all Surfaces   My clinical findings support the need for the above services: Unable to leave home safely without assistance and/or assistive device   Further, I certify that my clinical findings support that this patient is homebound due to: Unable to leave home safely without assistance   Increase activity slowly   Complete by: As directed      Allergies as of 03/28/2019   No Known Allergies     Medication List    TAKE these medications   amoxicillin 500 MG capsule Commonly known as: AMOXIL Take 2,000 mg by mouth daily as needed (dental procedure).   ARIPiprazole 5 MG tablet Commonly known as: ABILIFY Take 1 tablet (5 mg total) by mouth daily. What changed: how much to take   b complex vitamins tablet Take 1 tablet by mouth daily.   CALTRATE 600 PLUS-VIT D PO Take 1 tablet by mouth daily.   L-methylfolate Calcium 15 MG Tabs Take 15 mg by mouth daily.   torsemide 20 MG tablet Commonly known as: DEMADEX Take 1 tablet (20 mg total) by mouth daily. What changed: See the new instructions.   tranylcypromine 10 MG tablet Commonly known as: PARNATE Take 30 mg by mouth 2 (two) times daily.   Tylenol 8 Hour 650 MG CR tablet Generic drug: acetaminophen Take 1,300 mg by mouth 2 (two) times daily.            Durable Medical Equipment  (From  admission, onward)         Start     Ordered   03/28/19 1053  For home use only DME 3 n 1  Once     03/28/19 1054   03/28/19 1053  For home use only DME Cane  Once     03/28/19 1054         Follow-up Information    [facility] Follow up.   Specialty: Home Health Services Why: [place] physical therapy/HH occupational therapy Contact information: [place] [address] [phone]        [name], MD. Schedule an appointment as soon as possible for a visit in 1 week(s).   Specialty: Family Medicine Contact information: [place] [address] [phone]        Sueanne Margarita, MD .   Specialty: Cardiology Contact information: [phone] [place] [address] [phone]        Malena Catholic, MD. Schedule  an appointment as soon as possible for a visit in 1 week(s).   Contact information: [place] [address] [phone]          No Known Allergies  Consultations:  Orthopedics   Procedures/Studies: DG Chest Portable 1 View  Result Date: 03/24/2019 CLINICAL DATA:  Shortness of breath EXAM: PORTABLE CHEST 1 VIEW COMPARISON:  02/05/2019 FINDINGS: Cardiac shadows within normal limits. The lungs are well aerated bilaterally. No focal infiltrate or sizable effusion is seen. Calcified granuloma is noted in the left mid lung. Right shoulder replacement is noted. IMPRESSION: No active disease. Electronically Signed   By: Inez Catalina M.D.   On: 03/24/2019 14:40   DG Shoulder Left  Result Date: 03/26/2019 CLINICAL DATA:  Left shoulder pain with known fracture. EXAM: LEFT SHOULDER - 2+ VIEW COMPARISON:  None. FINDINGS: There is a proximal humerus fracture involving the greater tuberosity with less than 1 cm of displacement. There is no dislocation. Mild acromioclavicular osteoarthrosis is noted. IMPRESSION: Minimally displaced proximal humerus fracture as above.  Electronically Signed   By: Logan Bores M.D.   On: 03/26/2019 19:08   UE VENOUS DUPLEX ([facility] & [facility] 7 am - 7 pm)  Result Date: 03/24/2019 UPPER VENOUS STUDY  Indications: Swelling Risk Factors: Trauma Fracture. Comparison Study: No prior studies. Performing Technologist: Oliver Hum RVT  Examination Guidelines: A complete evaluation includes B-mode imaging, spectral Doppler, color Doppler, and power Doppler as needed of all accessible portions of each vessel. Bilateral testing is considered an integral part of a complete examination. Limited examinations for reoccurring indications may be performed as noted.  Right Findings: +----------+------------+---------+-----------+----------+-------+ RIGHT     CompressiblePhasicitySpontaneousPropertiesSummary +----------+------------+---------+-----------+----------+-------+ Subclavian    Full       Yes       Yes                      +----------+------------+---------+-----------+----------+-------+  Left Findings: +----------+------------+---------+-----------+----------+-------+ LEFT      CompressiblePhasicitySpontaneousPropertiesSummary +----------+------------+---------+-----------+----------+-------+ IJV           Full       Yes       Yes                      +----------+------------+---------+-----------+----------+-------+ Subclavian    Full       Yes       Yes                      +----------+------------+---------+-----------+----------+-------+ Axillary      Full       Yes       Yes                      +----------+------------+---------+-----------+----------+-------+ Brachial      Full       Yes       Yes                      +----------+------------+---------+-----------+----------+-------+ Radial        Full                                          +----------+------------+---------+-----------+----------+-------+ Ulnar         Full                                           +----------+------------+---------+-----------+----------+-------+  Cephalic      Full                                          +----------+------------+---------+-----------+----------+-------+ Basilic       Full                                          +----------+------------+---------+-----------+----------+-------+  Summary:  Right: No evidence of thrombosis in the subclavian.  Left: No evidence of deep vein thrombosis in the upper extremity. No evidence of superficial vein thrombosis in the upper extremity.  *See table(s) above for measurements and observations.  Diagnosing physician: Deitra Mayo MD Electronically signed by Deitra Mayo MD on 03/24/2019 at 6:42:29 PM.    Final       Subjective: Patient seen and examined bedside sitting in bedside chair resting comfortably.  Oxygen well on room air.  No specific plans or concerns this morning.  Will be discharging home with home health services today.  Denies headache, no chest pain, no palpitations, no shortness of breath, no abdominal pain.  No acute events overnight per nursing staff.   Discharge Exam: Vitals:   03/28/19 0627 03/28/19 0900  BP: (!) 107/58   Pulse: 80   Resp: 20   Temp: 98 F (36.7 C)   SpO2: 96% 95%   Vitals:   03/27/19 1240 03/27/19 2115 03/28/19 0627 03/28/19 0900  BP: 116/68 110/71 (!) 107/58   Pulse: 80 (!) 108 80   Resp: 16 18 20    Temp: 98.9 F (37.2 C) 98 F (36.7 C) 98 F (36.7 C)   TempSrc: Oral Oral Oral   SpO2: 96% 98% 96% 95%  Weight:   82.1 kg   Height:        General: Pt is alert, awake, not in acute distress Cardiovascular: RRR, S1/S2 +, no rubs, no gallops Respiratory: CTA bilaterally, no wheezing, no rhonchi Abdominal: Soft, NT, ND, bowel sounds + Extremities: no edema, no cyanosis, left arm in sling, neurovascular intact    The results of significant diagnostics from this hospitalization (including imaging, microbiology, ancillary and laboratory) are listed  below for reference.     Microbiology: Recent Results (from the past 240 hour(s))  SARS CORONAVIRUS 2 (TAT 6-24 HRS) Nasopharyngeal Nasopharyngeal Swab     Status: None   Collection Time: 03/24/19  5:51 PM   Specimen: Nasopharyngeal Swab  Result Value Ref Range Status   SARS Coronavirus 2 NEGATIVE NEGATIVE Final    Comment: (NOTE) SARS-CoV-2 target nucleic acids are NOT DETECTED. The SARS-CoV-2 RNA is generally detectable in upper and lower respiratory specimens during the acute phase of infection. Negative results do not preclude SARS-CoV-2 infection, do not rule out co-infections with other pathogens, and should not be used as the sole basis for treatment or other patient management decisions. Negative results must be combined with clinical observations, patient history, and epidemiological information. The expected result is Negative. Fact Sheet for Patients: SugarRoll.be Fact Sheet for Healthcare Providers: https://www.woods-mathews.com/ This test is not yet approved or cleared by the [place] FDA and  has been authorized for detection and/or diagnosis of SARS-CoV-2 by FDA under an Emergency Use Authorization (EUA). This EUA will remain  in effect (meaning this test can be used) for the duration of the COVID-19 declaration under  Section 56 4(b)(1) of the Act, 21 U.S.C. section 360bbb-3(b)(1), unless the authorization is terminated or revoked sooner. Performed at [facility], [place] [address] [place], [place] [address]      Labs: BNP (last 3 results) Recent Labs    03/24/19 1419  BNP 61.6   Basic Metabolic Panel: Recent Labs  Lab 03/24/19 1418 03/25/19 0540 03/26/19 0529 03/27/19 0439 03/28/19 0440  NA 143 142 141 137 141  K 4.0 4.0 3.9 3.6 3.7  CL 110 108 105 102 104  CO2 23 24 27 24 28   GLUCOSE 88 93 101* 102* 107*  BUN 45* 43* 45* 47* 57*  CREATININE 2.30* 2.23* 2.60* 2.41* 2.44*  CALCIUM 9.6 9.1 8.9  8.7* 8.8*  MG  --   --  2.3  --   --    Liver Function Tests: Recent Labs  Lab 03/24/19 1418  AST 22  ALT 15  ALKPHOS 83  BILITOT 0.6  PROT 6.4*  ALBUMIN 3.4*   No results for input(s): LIPASE, AMYLASE in the last 168 hours. No results for input(s): AMMONIA in the last 168 hours. CBC: Recent Labs  Lab 03/24/19 1418 03/25/19 0540 03/26/19 0529 03/27/19 0439 03/28/19 0440  WBC 7.0 5.8 6.3 6.1 6.5  NEUTROABS 4.8  --   --   --   --   HGB 9.5* 8.9* 9.5* 9.4* 9.5*  HCT 30.5* 29.0* 30.1*  28.2* 30.7* 30.3*  MCV 102.0* 102.8* 102.0* 103.7* 102.4*  PLT 214 204 217 213 236   Cardiac Enzymes: No results for input(s): CKTOTAL, CKMB, CKMBINDEX, TROPONINI in the last 168 hours. BNP: Invalid input(s): POCBNP CBG: No results for input(s): GLUCAP in the last 168 hours. D-Dimer No results for input(s): DDIMER in the last 72 hours. Hgb A1c No results for input(s): HGBA1C in the last 72 hours. Lipid Profile No results for input(s): CHOL, HDL, LDLCALC, TRIG, CHOLHDL, LDLDIRECT in the last 72 hours. Thyroid function studies No results for input(s): TSH, T4TOTAL, T3FREE, THYROIDAB in the last 72 hours.  Invalid input(s): FREET3 Anemia work up Recent Labs    03/26/19 0529  VITAMINB12 567  FERRITIN 136  TIBC 226*  IRON 44*   Urinalysis    Component Value Date/Time   COLORURINE YELLOW 11/27/2015 [place] 11/27/2015 1505   LABSPEC 1.016 11/27/2015 1505   PHURINE 5.5 11/27/2015 1505   GLUCOSEU NEGATIVE 11/27/2015 1505   HGBUR NEGATIVE 11/27/2015 1505   HGBUR negative 10/03/2009 1528   BILIRUBINUR Negative 07/11/2018 [place] 11/27/2015 1505   PROTEINUR Negative 07/11/2018 1207   PROTEINUR NEGATIVE 11/27/2015 1505   UROBILINOGEN 0.2 07/11/2018 1207   UROBILINOGEN 0.2 10/03/2009 1528   NITRITE Negative 07/11/2018 1207   NITRITE NEGATIVE 11/27/2015 1505   LEUKOCYTESUR Negative 07/11/2018 1207   Sepsis Labs Invalid input(s):  PROCALCITONIN,  WBC,  LACTICIDVEN Microbiology Recent Results (from the past 240 hour(s))  SARS CORONAVIRUS 2 (TAT 6-24 HRS) Nasopharyngeal Nasopharyngeal Swab     Status: None   Collection Time: 03/24/19  5:51 PM   Specimen: Nasopharyngeal Swab  Result Value Ref Range Status   SARS Coronavirus 2 NEGATIVE NEGATIVE Final    Comment: (NOTE) SARS-CoV-2 target nucleic acids are NOT DETECTED. The SARS-CoV-2 RNA is generally detectable in upper and lower respiratory specimens during the acute phase of infection. Negative results do not preclude SARS-CoV-2 infection, do not rule out co-infections with other pathogens, and should not be used as the sole basis for treatment or  other patient management decisions. Negative results must be combined with clinical observations, patient history, and epidemiological information. The expected result is Negative. Fact Sheet for Patients: SugarRoll.be Fact Sheet for Healthcare Providers: https://www.woods-mathews.com/ This test is not yet approved or cleared by the [place] FDA and  has been authorized for detection and/or diagnosis of SARS-CoV-2 by FDA under an Emergency Use Authorization (EUA). This EUA will remain  in effect (meaning this test can be used) for the duration of the COVID-19 declaration under Section 56 4(b)(1) of the Act, 21 U.S.C. section 360bbb-3(b)(1), unless the authorization is terminated or revoked sooner. Performed at [facility], [place] [address] [place], [place] [address]      Time coordinating discharge: Over 30 minutes  SIGNED:   Jafari J [place] DO  [facility] 03/28/2019, 11:00 AM

## 2019-03-29 DIAGNOSIS — D631 Anemia in chronic kidney disease: Secondary | ICD-10-CM | POA: Diagnosis not present

## 2019-03-29 DIAGNOSIS — I5033 Acute on chronic diastolic (congestive) heart failure: Secondary | ICD-10-CM | POA: Diagnosis not present

## 2019-03-29 DIAGNOSIS — M1030 Gout due to renal impairment, unspecified site: Secondary | ICD-10-CM | POA: Diagnosis not present

## 2019-03-29 DIAGNOSIS — N184 Chronic kidney disease, stage 4 (severe): Secondary | ICD-10-CM | POA: Diagnosis not present

## 2019-03-29 DIAGNOSIS — I130 Hypertensive heart and chronic kidney disease with heart failure and stage 1 through stage 4 chronic kidney disease, or unspecified chronic kidney disease: Secondary | ICD-10-CM | POA: Diagnosis not present

## 2019-03-30 ENCOUNTER — Other Ambulatory Visit: Payer: Self-pay

## 2019-03-30 ENCOUNTER — Encounter (HOSPITAL_BASED_OUTPATIENT_CLINIC_OR_DEPARTMENT_OTHER): Payer: Medicare Other | Admitting: Internal Medicine

## 2019-03-30 DIAGNOSIS — G9009 Other idiopathic peripheral autonomic neuropathy: Secondary | ICD-10-CM | POA: Insufficient documentation

## 2019-03-30 DIAGNOSIS — I890 Lymphedema, not elsewhere classified: Secondary | ICD-10-CM | POA: Diagnosis not present

## 2019-03-30 DIAGNOSIS — I872 Venous insufficiency (chronic) (peripheral): Secondary | ICD-10-CM | POA: Insufficient documentation

## 2019-03-30 DIAGNOSIS — I130 Hypertensive heart and chronic kidney disease with heart failure and stage 1 through stage 4 chronic kidney disease, or unspecified chronic kidney disease: Secondary | ICD-10-CM | POA: Insufficient documentation

## 2019-03-30 DIAGNOSIS — L97822 Non-pressure chronic ulcer of other part of left lower leg with fat layer exposed: Secondary | ICD-10-CM | POA: Diagnosis not present

## 2019-03-30 DIAGNOSIS — D631 Anemia in chronic kidney disease: Secondary | ICD-10-CM | POA: Diagnosis not present

## 2019-03-30 DIAGNOSIS — G4730 Sleep apnea, unspecified: Secondary | ICD-10-CM | POA: Diagnosis not present

## 2019-03-30 DIAGNOSIS — I5033 Acute on chronic diastolic (congestive) heart failure: Secondary | ICD-10-CM | POA: Diagnosis not present

## 2019-03-30 DIAGNOSIS — I509 Heart failure, unspecified: Secondary | ICD-10-CM | POA: Insufficient documentation

## 2019-03-30 DIAGNOSIS — M1990 Unspecified osteoarthritis, unspecified site: Secondary | ICD-10-CM | POA: Diagnosis not present

## 2019-03-30 DIAGNOSIS — N184 Chronic kidney disease, stage 4 (severe): Secondary | ICD-10-CM | POA: Diagnosis not present

## 2019-03-30 DIAGNOSIS — G629 Polyneuropathy, unspecified: Secondary | ICD-10-CM | POA: Diagnosis not present

## 2019-03-30 DIAGNOSIS — L97222 Non-pressure chronic ulcer of left calf with fat layer exposed: Secondary | ICD-10-CM | POA: Diagnosis not present

## 2019-03-30 DIAGNOSIS — M1030 Gout due to renal impairment, unspecified site: Secondary | ICD-10-CM | POA: Diagnosis not present

## 2019-03-31 DIAGNOSIS — M1030 Gout due to renal impairment, unspecified site: Secondary | ICD-10-CM | POA: Diagnosis not present

## 2019-03-31 DIAGNOSIS — N184 Chronic kidney disease, stage 4 (severe): Secondary | ICD-10-CM | POA: Diagnosis not present

## 2019-03-31 DIAGNOSIS — I130 Hypertensive heart and chronic kidney disease with heart failure and stage 1 through stage 4 chronic kidney disease, or unspecified chronic kidney disease: Secondary | ICD-10-CM | POA: Diagnosis not present

## 2019-03-31 DIAGNOSIS — D631 Anemia in chronic kidney disease: Secondary | ICD-10-CM | POA: Diagnosis not present

## 2019-03-31 DIAGNOSIS — I5033 Acute on chronic diastolic (congestive) heart failure: Secondary | ICD-10-CM | POA: Diagnosis not present

## 2019-04-02 ENCOUNTER — Telehealth: Payer: Self-pay | Admitting: Family Medicine

## 2019-04-02 DIAGNOSIS — I130 Hypertensive heart and chronic kidney disease with heart failure and stage 1 through stage 4 chronic kidney disease, or unspecified chronic kidney disease: Secondary | ICD-10-CM | POA: Diagnosis not present

## 2019-04-02 DIAGNOSIS — M1030 Gout due to renal impairment, unspecified site: Secondary | ICD-10-CM | POA: Diagnosis not present

## 2019-04-02 DIAGNOSIS — N184 Chronic kidney disease, stage 4 (severe): Secondary | ICD-10-CM | POA: Diagnosis not present

## 2019-04-02 DIAGNOSIS — I5033 Acute on chronic diastolic (congestive) heart failure: Secondary | ICD-10-CM | POA: Diagnosis not present

## 2019-04-02 DIAGNOSIS — D631 Anemia in chronic kidney disease: Secondary | ICD-10-CM | POA: Diagnosis not present

## 2019-04-02 NOTE — Telephone Encounter (Signed)
Maximino Greenland is calling to have a verbal order for physical therapy and asked that the nurse give him a call back.

## 2019-04-02 NOTE — Telephone Encounter (Signed)
Nurse from [facility] called stating that she wanted Dr. Yong Channel to approve for nurses to come see pt the rest of this week. Pt is experiencing some side effects from diet. Nurse asked for Dr.Hunter to please approve fax when it comes through.

## 2019-04-02 NOTE — Telephone Encounter (Signed)
Patient calls in saying that he has been difficulty breathing and his R foot has been feeling hot to the touch at night and wanted to know if we could get him seen before the next available appt which is 04/30/19

## 2019-04-02 NOTE — Progress Notes (Signed)
Jamerson, Vonbargen Parnell ([id number]) Visit Report for 03/30/2019 Debridement Details Patient Name: Date of Service: NAVRAJ, DREIBELBIS 03/30/2019 2:30 PM Medical Record [id number] Patient Account Number: [id number] Date of Birth/Sex: Treating RN: 1940/11/30 (79 y.o. [name] Primary Care Provider: Garret Reddish Other Clinician: Referring Provider: Treating Provider/Extender:Alnisa Hasley, Kandice Hams, Alexis Goodell in Treatment: 8 Debridement Performed for Wound #1 Left,Posterior Lower Leg Assessment: Performed By: Physician Ricard Dillon., MD Debridement Type: Debridement Severity of Tissue Pre Fat layer exposed Debridement: Level of Consciousness (Pre- Awake and Alert procedure): Pre-procedure Verification/Time Out Taken: Yes - 15:11 Start Time: 15:11 Pain Control: Other : benzocaine, 20% Total Area Debrided (L x W): 1 (cm) x 1 (cm) = 1 (cm) Tissue and other material Viable, Non-Viable, Eschar, Skin: Dermis debrided: Level: Skin/Dermis Debridement Description: Selective/Open Wound Instrument: Curette Bleeding: Minimum Hemostasis Achieved: Pressure End Time: 15:12 Procedural Pain: 0 Post Procedural Pain: 0 Response to Treatment: Procedure was tolerated well Level of Consciousness Awake and Alert (Post-procedure): Post Debridement Measurements of Total Wound Length: (cm) 1.6 Width: (cm) 1.2 Depth: (cm) 0.1 Volume: (cm) 0.151 Character of Wound/Ulcer Post Improved Debridement: Severity of Tissue Post Debridement: Fat layer exposed Post Procedure Diagnosis Same as Pre-procedure Electronic Signature(s) Signed: 03/30/2019 4:35:49 PM By: Linton Ham MD Signed: 04/02/2019 5:57:16 PM By: Kela Millin Entered By: Kela Millin on 03/30/2019 15:12:14 -------------------------------------------------------------------------------- HPI Details Patient Name: Date of Service: [name], Marcellis 03/30/2019 2:30 PM Medical Record [id number] Patient  Account Number: [id number] Date of Birth/Sex: Treating RN: 06-20-1940 (79 y.o. M) Primary Care Provider: Garret Reddish Other Clinician: Referring Provider: Treating Provider/Extender:Mont Jagoda, Kandice Hams, Alexis Goodell in Treatment: 8 History of Present Illness HPI Description: 01/31/2019 on evaluation today patient actually appears to be doing poorly unfortunately secondary to an injury that occurred when he hit his leg on a car door September 9. He was seen in the ER where he had 13 sutures placed. His primary care provider remove those 2 weeks later and unfortunately the wound began to dehisce following. It appears that he may have had a skin flap that just did not take and survive. Nonetheless he was stated to have had Keflex given from the ER though the patient states he does not even remember that. He also is stated to have taken 2 rounds of doxycycline by his primary care provider but again the patient states he only remembers the most recent antibiotic which was actually the Augmentin then switched to Bactrim based on the culture results. The culture showed that he had Morganella Morgagni noted in the culture. Subsequently when he was taking the Bactrim he states that the redness got much better but then unfortunately worsened again once he came off of the medication. Apparently the patient was given triamcinolone ointment to put on the wound although obviously that really did not do much for the wound and again the patient tells me that he was "given the wrong ointment". Again I am not exactly sure what was going on in that scenario 1 where another. Either way I do believe that he has necrotic tissue on the surface of the wound this can require some debridement if he is able to tolerate it and then subsequently he is also get a need likely a refill of the Bactrim in order to help treat the infection I do not believe this is completely cleared up at this point. I also think that he  really needs to have edema control again he is very swollen and I believe that Performing a compression wrap  will also be of great benefit for him. The patient does have chronic kidney disease stage IV along with peripheral neuropathy secondary to issues with his back. 02/07/2019 on evaluation today patient appears to be doing a little better with regard to the overall appearance of the wound although it appears to be very dry. Fortunately there is no signs of active infection at this time. No fever chills noted. I really feel like that the patient may be having issues at this point with the Iodoflex being too absorptive for him at this point which is causing this to dry out. I think we may want to try something different to see if that would be of benefit for him. 02/14/2019 on evaluation today patient actually appears to be doing much better with regard to his wound at this time. He has been tolerating the dressing changes without complication. Fortunately everything appears to be doing much better from the standpoint of how soft the dead tissue is today in fact I was able to debride away some of the necrotic tissue because it was doing so well. Fortunately he is showing signs of good improvement as of this week. 02/21/2019 on evaluation today patient feels like he is actually doing a little bit worse noticing the redness on his lower extremity. I agree I have not seen this before and I am concerned about the possibility of infection. I think we may want to initiate something such as doxycycline and possibly wound culture today to see what exactly may be going on here. 02/28/19 evaluation today patient appears to be doing better with regard to his wound. Hes been tolerating the dressing changes without complication. Fortunately theres no signs of active infection at this time. Overall I feel like the doxycycline has also been helpful for him. 03/07/2019 on evaluation today patient appears to be  doing very well with regard to his lower extremity ulcer which is showing excellent improvement. Fortunately there is no evidence of active infection which is good news. No fevers, chills, nausea, vomiting, or diarrhea. Overall I feel like he is making excellent progress towards closure. 12/28; since the patient was last here he fell and apparently fractured his shoulder in 2 places. He is seeing orthopedics. Kept his wrap on for 2 weeks. He has a venous insufficiency wound on the posterior left calf. We have been using silver collagen under 3 layer compression 03/30/2019. Patient stopped taking his torsemide because he was going to the bathroom frequently which was difficult because of his fractured shoulder. He was apparently admitted to hospital for congestive heart failure. He tells [place] that they changed his compression on the left leg twice. His wound is measuring smaller. We have been using silver collagen Electronic Signature(s) Signed: 03/30/2019 4:35:49 PM By: Linton Ham MD Entered By: Linton Ham on 03/30/2019 15:16:04 -------------------------------------------------------------------------------- Physical Exam Details Patient Name: Date of Service: Jamien, Casanova Yoshiharu 03/30/2019 2:30 PM Medical Record [id number] Patient Account Number: [id number] Date of Birth/Sex: Treating RN: 1940/09/17 (79 y.o. M) Primary Care Provider: Garret Reddish Other Clinician: Referring Provider: Treating Provider/Extender:Denea Cheaney, Kandice Hams, Alexis Goodell in Treatment: 8 Cardiovascular Patient has severe chronic venous insufficiency with lymphedema.. Notes Wound exam; the patient has healthy granulation tissue. I removed some skin and eschar from the wound margin but there was no need to debride the wound surface. No evidence of surrounding infection Electronic Signature(s) Signed: 03/30/2019 4:35:49 PM By: Linton Ham MD Entered By: Linton Ham on 03/30/2019  15:17:00 -------------------------------------------------------------------------------- Physician Orders Details Patient Name: Date  of Service: BRYSUN, ESCHMANN 03/30/2019 2:30 PM Medical Record [id number] Patient Account Number: [id number] Date of Birth/Sex: Treating RN: 1941/03/11 (79 y.o. [name] Primary Care Provider: Garret Reddish Other Clinician: Referring Provider: Treating Provider/Extender:Aman Batley, Kandice Hams, Alexis Goodell in Treatment: 8 Verbal / Phone Orders: No Diagnosis Coding ICD-10 Coding Code Description I87.2 Venous insufficiency (chronic) (peripheral) S81.802A Unspecified open wound, left lower leg, initial encounter L97.822 Non-pressure chronic ulcer of other part of left lower leg with fat layer exposed N18.4 Chronic kidney disease, stage 4 (severe) G90.09 Other idiopathic peripheral autonomic neuropathy Follow-up Appointments Return Appointment in 1 week. - Wednesday with Margarita Grizzle Dressing Change Frequency Wound #1 Left,Posterior Lower Leg Do not change entire dressing for one week. Skin Barriers/Peri-Wound Care Wound #1 Left,Posterior Lower Leg Moisturizing lotion - to leg Wound Cleansing Wound #1 Left,Posterior Lower Leg May shower with protection. Primary Wound Dressing Wound #1 Left,Posterior Lower Leg Silver Collagen - moisten with hydrogel Secondary Dressing Wound #1 Left,Posterior Lower Leg Dry Gauze ABD pad Edema Control 3 Layer Compression System - Left Lower Extremity Avoid standing for long periods of time Elevate legs to the level of the heart or above for 30 minutes daily and/or when sitting, a frequency of: - throughout the week Exercise regularly Electronic Signature(s) Signed: 03/30/2019 2:58:22 PM By: Kela Millin Signed: 03/30/2019 4:35:49 PM By: Linton Ham MD Entered By: Kela Millin on 03/30/2019 14:42:20 -------------------------------------------------------------------------------- Problem  List Details Patient Name: Date of Service: Alexis, Mizuno Koen 03/30/2019 2:30 PM Medical Record [id number] Patient Account Number: [id number] Date of Birth/Sex: Treating RN: 1940/10/25 (79 y.o. [name] Primary Care Provider: Garret Reddish Other Clinician: Referring Provider: Treating Provider/Extender:Auda Finfrock, Kandice Hams, Alexis Goodell in Treatment: 8 Active Problems ICD-10 Evaluated Encounter Code Description Active Date Today Diagnosis I87.2 Venous insufficiency (chronic) (peripheral) 01/31/2019 No Yes S81.802A Unspecified open wound, left lower leg, initial 01/31/2019 No Yes encounter L97.822 Non-pressure chronic ulcer of other part of left lower 01/31/2019 No Yes leg with fat layer exposed N18.4 Chronic kidney disease, stage 4 (severe) 01/31/2019 No Yes G90.09 Other idiopathic peripheral autonomic neuropathy 01/31/2019 No Yes Inactive Problems Resolved Problems Electronic Signature(s) Signed: 03/30/2019 4:35:49 PM By: Linton Ham MD Previous Signature: 03/30/2019 2:58:22 PM Version By: Kela Millin Entered By: Linton Ham on 03/30/2019 15:15:03 -------------------------------------------------------------------------------- Progress Note Details Patient Name: Date of Service: [name], Oliver 03/30/2019 2:30 PM Medical Record [id number] Patient Account Number: [id number] Date of Birth/Sex: Treating RN: Oct 31, 1940 (79 y.o. M) Primary Care Provider: Garret Reddish Other Clinician: Referring Provider: Treating Provider/Extender:Jaydee Conran, Kandice Hams, Alexis Goodell in Treatment: 8 Subjective History of Present Illness (HPI) 01/31/2019 on evaluation today patient actually appears to be doing poorly unfortunately secondary to an injury that occurred when he hit his leg on a car door September 9. He was seen in the ER where he had 13 sutures placed. His primary care provider remove those 2 weeks later and unfortunately the wound began to  dehisce following. It appears that he may have had a skin flap that just did not take and survive. Nonetheless he was stated to have had Keflex given from the ER though the patient states he does not even remember that. He also is stated to have taken 2 rounds of doxycycline by his primary care provider but again the patient states he only remembers the most recent antibiotic which was actually the Augmentin then switched to Bactrim based on the culture results. The culture showed that he had Morganella Morgagni noted in the culture. Subsequently when he was taking the  Bactrim he states that the redness got much better but then unfortunately worsened again once he came off of the medication. Apparently the patient was given triamcinolone ointment to put on the wound although obviously that really did not do much for the wound and again the patient tells me that he was "given the wrong ointment". Again I am not exactly sure what was going on in that scenario 1 where another. Either way I do believe that he has necrotic tissue on the surface of the wound this can require some debridement if he is able to tolerate it and then subsequently he is also get a need likely a refill of the Bactrim in order to help treat the infection I do not believe this is completely cleared up at this point. I also think that he really needs to have edema control again he is very swollen and I believe that Performing a compression wrap will also be of great benefit for him. The patient does have chronic kidney disease stage IV along with peripheral neuropathy secondary to issues with his back. 02/07/2019 on evaluation today patient appears to be doing a little better with regard to the overall appearance of the wound although it appears to be very dry. Fortunately there is no signs of active infection at this time. No fever chills noted. I really feel like that the patient may be having issues at this point with the  Iodoflex being too absorptive for him at this point which is causing this to dry out. I think we may want to try something different to see if that would be of benefit for him. 02/14/2019 on evaluation today patient actually appears to be doing much better with regard to his wound at this time. He has been tolerating the dressing changes without complication. Fortunately everything appears to be doing much better from the standpoint of how soft the dead tissue is today in fact I was able to debride away some of the necrotic tissue because it was doing so well. Fortunately he is showing signs of good improvement as of this week. 02/21/2019 on evaluation today patient feels like he is actually doing a little bit worse noticing the redness on his lower extremity. I agree I have not seen this before and I am concerned about the possibility of infection. I think we may want to initiate something such as doxycycline and possibly wound culture today to see what exactly may be going on here. 02/28/19 evaluation today patient appears to be doing better with regard to his wound. Heoos been tolerating the dressing changes without complication. Fortunately thereoos no signs of active infection at this time. Overall I feel like the doxycycline has also been helpful for him. 03/07/2019 on evaluation today patient appears to be doing very well with regard to his lower extremity ulcer which is showing excellent improvement. Fortunately there is no evidence of active infection which is good news. No fevers, chills, nausea, vomiting, or diarrhea. Overall I feel like he is making excellent progress towards closure. 12/28; since the patient was last here he fell and apparently fractured his shoulder in 2 places. He is seeing orthopedics. Kept his wrap on for 2 weeks. He has a venous insufficiency wound on the posterior left calf. We have been using silver collagen under 3 layer compression 03/30/2019. Patient stopped  taking his torsemide because he was going to the bathroom frequently which was difficult because of his fractured shoulder. He was apparently admitted to hospital for  congestive heart failure. He tells [place] that they changed his compression on the left leg twice. His wound is measuring smaller. We have been using silver collagen Objective Constitutional Vitals Time Taken: 2:50 PM, Height: 74 in, Weight: 180 lbs, BMI: 23.1, Temperature: 98.0 F, Pulse: 109 bpm, Respiratory Rate: 20 breaths/min, Blood Pressure: 118/64 mmHg. Cardiovascular Patient has severe chronic venous insufficiency with lymphedema.. General Notes: Wound exam; the patient has healthy granulation tissue. I removed some skin and eschar from the wound margin but there was no need to debride the wound surface. No evidence of surrounding infection Integumentary (Hair, Skin) Wound #1 status is Open. Original cause of wound was Trauma. The wound is located on the Left,Posterior Lower Leg. The wound measures 1.6cm length x 1.2cm width x 0.1cm depth; 1.508cm^2 area and 0.151cm^3 volume. There is Fat Layer (Subcutaneous Tissue) Exposed exposed. There is no tunneling or undermining noted. There is a medium amount of serosanguineous drainage noted. The wound margin is flat and intact. There is large (67-100%) red granulation within the wound bed. There is a small (1-33%) amount of necrotic tissue within the wound bed including Adherent Slough. Assessment Active Problems ICD-10 Venous insufficiency (chronic) (peripheral) Unspecified open wound, left lower leg, initial encounter Non-pressure chronic ulcer of other part of left lower leg with fat layer exposed Chronic kidney disease, stage 4 (severe) Other idiopathic peripheral autonomic neuropathy Procedures Wound #1 Pre-procedure diagnosis of Wound #1 is a Venous Leg Ulcer located on the Left,Posterior Lower Leg .Severity of Tissue Pre Debridement is: Fat layer exposed. There was a  Selective/Open Wound Skin/Dermis Debridement with a total area of 1 sq cm performed by Ricard Dillon., MD. With the following instrument(s): Curette to remove Viable and Non-Viable tissue/material. Material removed includes Eschar and Skin: Dermis and after achieving pain control using Other (benzocaine, 20%). No specimens were taken. A time out was conducted at 15:11, prior to the start of the procedure. A Minimum amount of bleeding was controlled with Pressure. The procedure was tolerated well with a pain level of 0 throughout and a pain level of 0 following the procedure. Post Debridement Measurements: 1.6cm length x 1.2cm width x 0.1cm depth; 0.151cm^3 volume. Character of Wound/Ulcer Post Debridement is improved. Severity of Tissue Post Debridement is: Fat layer exposed. Post procedure Diagnosis Wound #1: Same as Pre-Procedure Pre-procedure diagnosis of Wound #1 is a Venous Leg Ulcer located on the Left,Posterior Lower Leg . There was a Three Layer Compression Therapy Procedure by Kela Millin, RN. Post procedure Diagnosis Wound #1: Same as Pre-Procedure Plan Follow-up Appointments: Return Appointment in 1 week. - Wednesday with Margarita Grizzle Dressing Change Frequency: Wound #1 Left,Posterior Lower Leg: Do not change entire dressing for one week. Skin Barriers/Peri-Wound Care: Wound #1 Left,Posterior Lower Leg: Moisturizing lotion - to leg Wound Cleansing: Wound #1 Left,Posterior Lower Leg: May shower with protection. Primary Wound Dressing: Wound #1 Left,Posterior Lower Leg: Silver Collagen - moisten with hydrogel Secondary Dressing: Wound #1 Left,Posterior Lower Leg: Dry Gauze ABD pad Edema Control: 3 Layer Compression System - Left Lower Extremity Avoid standing for long periods of time Elevate legs to the level of the heart or above for 30 minutes daily and/or when sitting, a frequency of: - throughout the week Exercise regularly 1. Silver collagen moistened with  hydrogel under 3 layer compression 2. The patient states that he has compression stockings I have asked him to bring those in next week. He has very significant lower extremity edema most notably now on the right. This  is nonpitting edema. 3. Fortunately the wound is measuring smaller and surface looks Electronic Signature(s) Signed: 03/30/2019 4:35:49 PM By: Linton Ham MD Entered By: Linton Ham on 03/30/2019 15:18:00 -------------------------------------------------------------------------------- SuperBill Details Patient Name: Date of Service: Dayne, Chait Kaydin 03/30/2019 Medical Record [id number] Patient Account Number: [id number] Date of Birth/Sex: Treating RN: Aug 12, 1940 (79 y.o. [name] Primary Care Provider: Garret Reddish Other Clinician: Referring Provider: Treating Provider/Extender:Caison Hearn, Kandice Hams, Alexis Goodell in Treatment: 8 Diagnosis Coding ICD-10 Codes Code Description I87.2 Venous insufficiency (chronic) (peripheral) S81.802A Unspecified open wound, left lower leg, initial encounter L97.822 Non-pressure chronic ulcer of other part of left lower leg with fat layer exposed N18.4 Chronic kidney disease, stage 4 (severe) G90.09 Other idiopathic peripheral autonomic neuropathy Facility Procedures CPT4 Code Description: [id number] - DEBRIDE WOUND 1ST 20 SQ CM OR < ICD-10 Diagnosis Description L97.822 Non-pressure chronic ulcer of other part of left lower leg Modifier: with fat laye Quantity: 1 r exposed Physician Procedures CPT4 Code Description: [id number] - WC PHYS DEBR WO ANESTH 20 SQ CM ICD-10 Diagnosis Description L97.822 Non-pressure chronic ulcer of other part of left lower leg Modifier: with fat laye Quantity: 1 r exposed Electronic Signature(s) Signed: 03/30/2019 4:35:49 PM By: Linton Ham MD Entered By: Linton Ham on 03/30/2019 15:18:11

## 2019-04-03 ENCOUNTER — Ambulatory Visit (INDEPENDENT_AMBULATORY_CARE_PROVIDER_SITE_OTHER): Payer: Medicare Other | Admitting: Family Medicine

## 2019-04-03 ENCOUNTER — Ambulatory Visit: Payer: Medicare Other | Admitting: Family Medicine

## 2019-04-03 ENCOUNTER — Other Ambulatory Visit: Payer: Self-pay

## 2019-04-03 ENCOUNTER — Encounter: Payer: Self-pay | Admitting: Family Medicine

## 2019-04-03 VITALS — BP 110/76 | HR 65 | Temp 98.2°F | Ht 62.0 in | Wt 181.6 lb

## 2019-04-03 DIAGNOSIS — I5032 Chronic diastolic (congestive) heart failure: Secondary | ICD-10-CM | POA: Diagnosis not present

## 2019-04-03 DIAGNOSIS — N184 Chronic kidney disease, stage 4 (severe): Secondary | ICD-10-CM

## 2019-04-03 DIAGNOSIS — R739 Hyperglycemia, unspecified: Secondary | ICD-10-CM

## 2019-04-03 DIAGNOSIS — M810 Age-related osteoporosis without current pathological fracture: Secondary | ICD-10-CM | POA: Diagnosis not present

## 2019-04-03 DIAGNOSIS — R202 Paresthesia of skin: Secondary | ICD-10-CM | POA: Diagnosis not present

## 2019-04-03 DIAGNOSIS — G629 Polyneuropathy, unspecified: Secondary | ICD-10-CM

## 2019-04-03 LAB — CBC WITH DIFFERENTIAL/PLATELET
Basophils Absolute: 0.1 10*3/uL (ref 0.0–0.1)
Basophils Relative: 2 % (ref 0.0–3.0)
Eosinophils Absolute: 0.7 10*3/uL (ref 0.0–0.7)
Eosinophils Relative: 9.6 % — ABNORMAL HIGH (ref 0.0–5.0)
HCT: 32.6 % — ABNORMAL LOW (ref 39.0–52.0)
Hemoglobin: 10.6 g/dL — ABNORMAL LOW (ref 13.0–17.0)
Lymphocytes Relative: 16.2 % (ref 12.0–46.0)
Lymphs Abs: 1.1 10*3/uL (ref 0.7–4.0)
MCHC: 32.6 g/dL (ref 30.0–36.0)
MCV: 96.3 fl (ref 78.0–100.0)
Monocytes Absolute: 0.8 10*3/uL (ref 0.1–1.0)
Monocytes Relative: 12 % (ref 3.0–12.0)
Neutro Abs: 4.1 10*3/uL (ref 1.4–7.7)
Neutrophils Relative %: 60.2 % (ref 43.0–77.0)
Platelets: 347 10*3/uL (ref 150.0–400.0)
RBC: 3.38 Mil/uL — ABNORMAL LOW (ref 4.22–5.81)
RDW: 13.7 % (ref 11.5–15.5)
WBC: 6.9 10*3/uL (ref 4.0–10.5)

## 2019-04-03 LAB — COMPREHENSIVE METABOLIC PANEL
ALT: 9 U/L (ref 0–53)
AST: 15 U/L (ref 0–37)
Albumin: 4.1 g/dL (ref 3.5–5.2)
Alkaline Phosphatase: 97 U/L (ref 39–117)
BUN: 65 mg/dL — ABNORMAL HIGH (ref 6–23)
CO2: 27 mEq/L (ref 19–32)
Calcium: 9.7 mg/dL (ref 8.4–10.5)
Chloride: 102 mEq/L (ref 96–112)
Creatinine, Ser: 2.49 mg/dL — ABNORMAL HIGH (ref 0.40–1.50)
GFR: 25.15 mL/min — ABNORMAL LOW (ref >60.00)
Glucose, Bld: 102 mg/dL — ABNORMAL HIGH (ref 70–99)
Potassium: 3.8 mEq/L (ref 3.5–5.1)
Sodium: 139 mEq/L (ref 135–145)
Total Bilirubin: 0.4 mg/dL (ref 0.2–1.2)
Total Protein: 7 g/dL (ref 6.0–8.3)

## 2019-04-03 LAB — HEMOGLOBIN A1C: Hgb A1c MFr Bld: 5.7 % (ref 4.6–6.5)

## 2019-04-03 LAB — TSH: TSH: 2.26 u[IU]/mL (ref 0.35–4.50)

## 2019-04-03 NOTE — Assessment & Plan Note (Signed)
#   foot pain/likely neuropathy S: Patient says that he has a burning sensation in his right foot at bedtime. Patient states that it burns so bad it wakes him up at night. Has been increasing over the last two months.  We reviewed chart and similarly last year in February he had similar issues-we did some blood work for neuropathy A/P: Reminded patient of prior diagnosis of neuropathy-possible this is coming from his back.  Since he has relief of symptoms with foot outside the cover I encouraged him to start sleep with the foot outside to cover.  He has excellent pulses today and I do not believe this is arterial in origin.  I will repeat a TSH and A1c but he just had a B12 in the hospital. -We could use medications for this but since I think there may be a simple fix we opted not to use medication-I worry about causing further somnolence/fatigue with medications like gabapentin-would also have to be cautious with CKD stage IV

## 2019-04-03 NOTE — Progress Notes (Signed)
Phone [phone] In person visit   Subjective:   [name] is a 79 y.o. year old very pleasant male patient who presents for/with See problem oriented charting Chief Complaint  Patient presents with  . Shortness of Breath  . Foot Pain    right    This visit occurred during the SARS-CoV-2 public health emergency.  Safety protocols were in place, including screening questions prior to the visit, additional usage of staff PPE, and extensive cleaning of exam room while observing appropriate contact time as indicated for disinfecting solutions.   Past Medical History-  Patient Active Problem List   Diagnosis Date Noted  . Chronic diastolic CHF (congestive heart failure) ([place]) 11/30/2017    Priority: High  . Mild depressed bipolar I disorder ([place]) 09/21/2006    Priority: High  . Chronic low back pain with degenerative lumbar spinal stenosis-minimal improvement with surgery late 2019 03/08/2017    Priority: Medium  . Gout 07/07/2016    Priority: Medium  . Fatty liver 10/09/2014    Priority: Medium  . OSA (obstructive sleep apnea) 10/30/2008    Priority: Medium  . Osteoporosis 09/25/2008    Priority: Medium  . CKD (chronic kidney disease), stage IV ([place]) 03/01/2008    Priority: Medium  . History of fracture of left hip 09/01/2017    Priority: Low  . Senile purpura ([place]) 06/22/2017    Priority: Low  . Osteoarthritis of spine 03/21/2017    Priority: Low  . Hypersomnolence 08/20/2016    Priority: Low  . Other constipation 01/13/2016    Priority: Low  . Primary osteoarthritis of right knee 12/08/2015    Priority: Low  . Gallstones 10/09/2014    Priority: Low  . Bilateral lower extremity edema 09/25/2014    Priority: Low  . Chronic venous insufficiency 08/29/2013    Priority: Low  . Right shoulder pain 08/29/2013    Priority: Low  . Neuropathy 01/20/2011    Priority: Low  . Acute CHF (congestive heart failure) ([place]) 03/24/2019  . Anemia due to stage 4 chronic  kidney disease ([place]) 03/24/2019  . Wound of left lower extremity 03/24/2019  . Weakness 03/24/2019  . Paroxysmal tachycardia ([place]) 12/05/2018  . Spondylolisthesis of lumbar region 03/10/2018  . Leukocytosis 09/04/2017  . Proximal humerus fracture 05/22/2013    Medications- reviewed and updated Current Outpatient Medications  Medication Sig Dispense Refill  . acetaminophen (TYLENOL 8 HOUR) 650 MG CR tablet Take 1,300 mg by mouth 2 (two) times daily.     . ARIPiprazole (ABILIFY) 5 MG tablet Take 1 tablet (5 mg total) by mouth daily. (Patient taking differently: Take 2.5 mg by mouth daily. ) 90 tablet 3  . b complex vitamins tablet Take 1 tablet by mouth daily.    . L-methylfolate Calcium 15 MG TABS Take 15 mg by mouth daily.    [facility] torsemide (DEMADEX) 20 MG tablet Take 1 tablet (20 mg total) by mouth daily. 90 tablet 0  . tranylcypromine (PARNATE) 10 MG tablet Take 30 mg by mouth 2 (two) times daily.     [facility] amoxicillin (AMOXIL) 500 MG capsule Take 2,000 mg by mouth daily as needed (dental procedure).     . Calcium-Vitamin D (CALTRATE 600 PLUS-VIT D PO) Take 1 tablet by mouth daily.      No current facility-administered medications for this visit.     Objective:  BP 110/76   Pulse 65   Temp 98.2 F (36.8 C) (Temporal)   Ht 5\' 2"  (1.575 m)  Wt 181 lb 9.6 oz (82.4 kg)   SpO2 98%   BMI 33.22 kg/m  Gen: NAD, resting comfortably CV: RRR no murmurs rubs or gallops Lungs: CTAB no crackles, wheeze, rhonchi Ext: 1+ edema bilaterally.  2+ DP and PT pulses.  Perhaps faint erythema of most toes without warmth or edema above baseline.  Good range of motion of toes and no increased pain with palpation Skin: warm, dry    Assessment and Plan    # foot pain/likely neuropathy S: Patient says that he has a burning sensation in his right foot at bedtime. Patient states that it burns so bad it wakes him up at night. Has been increasing over the last two months.  We reviewed chart and similarly  last year in February he had similar issues-we did some blood work for neuropathy A/P: Reminded patient of prior diagnosis of neuropathy-possible this is coming from his back.  Since he has relief of symptoms with foot outside the cover I encouraged him to start sleep with the foot outside to cover.  He has excellent pulses today and I do not believe this is arterial in origin.  I will repeat a TSH and A1c but he just had a B12 in the hospital.  A1c has been above 5.7 in the past but never in diabetes range -We could use medications for this but since I think there may be a simple fix we opted not to use medication-I worry about causing further somnolence/fatigue with medications like gabapentin-would also have to be cautious with CKD stage IV  #Chronic diastolic congestive heart failure S: Compliant with torsemide 20 mg.  After patient sustained left humerus fracture he opted to not take torsemide as he was tired of going to the bathroom.  Unfortunately this resulted in CHF exacerbation and hospitalization-he was just discharged 6 days ago.  Appears weight has trended down at least 5 pounds.  Patient reports swelling is improving.  His shortness of breath is also improving A/P: CHF appears to be improving with consistent torsemide use.  They recommended repeat BMP in a week and we will update CMP today   #CKD stage IV S: Follows with [facility].  Remains on torsemide 20 mg daily.  Knows to avoid NSAIDs. A/P: Hopefully stable back on torsemide-update CMP today.   #Chronic low back pain- degenerative lumbar stenosis.  Minimal improvement with surgery late 2019 S: Patient with continued low back pain A/P: We discussed as above back pain may be related to neuropathy/right foot pain.  Hold off on medication for now to see if simple fix as above is helpful enough   #Osteoporosis-noted after visit that patient has not received another dose of Prolia.  Unfortunately we have lost her Prolia  coordinator-I am going to ask our team to place a referral to endocrinology and update patient  Recommended follow up: Return in about 4 months (around 08/01/2019) for follow up- or sooner if needed. Future Appointments  Date Time Provider [place]  04/06/2019  2:45 PM Ricard Dillon, MD [facility]    Lab/Order associations:   ICD-10-CM   1. Chronic diastolic CHF (congestive heart failure) (HCC)  I50.32 Comprehensive metabolic panel    CBC with Differential  2. Hyperglycemia  R73.9 Hemoglobin A1c  3. Paresthesias  R20.2 TSH  4. Neuropathy  G62.9   5. CKD (chronic kidney disease), stage IV (HCC)  N18.4   6. Age-related osteoporosis without current pathological fracture  M81.0    Return precautions advised.  Garret Reddish, MD

## 2019-04-03 NOTE — Telephone Encounter (Signed)
Agree with needing visit. I see negative test 10 days ago for covid but need some context on that- please evaluate for possible covid/exposure since leaving hospital

## 2019-04-03 NOTE — Telephone Encounter (Signed)
Approved for 1 time a week for 3 weeks to work with CHF and diet.

## 2019-04-03 NOTE — Patient Instructions (Addendum)
Please stop by lab before you go If you do not have mychart- we will call you about results within 5 business days of [place] receiving them.  If you have mychart- we will send your results within 3 business days of [place] receiving them.  If abnormal or we want to clarify a result, we will call or mychart you to make sure you receive the message.  If you have questions or concerns or don't hear within 5-7 days, please send [place] a message or call us.   I think you have nerve related pain in your right foot (neuropathy). I would advise trying to keep those toes outside the covers since that seems to bother you the most.   Glad you are doing better since the hospital.   Continue torsemide daily- need to make sure to continue to not miss doses zt  Recommended follow up: Return in about 4 months (around 08/01/2019) for follow up- or sooner if needed.   We are committed to keeping you informed about the COVID-19 vaccine.  As the vaccine continues to become available for each phase, we will ensure that patients who meet the criteria receive the information they need to access vaccination opportunities. Continue to check your MyChart account and [email or web address] for updates. Please review the Phase 1b information below.  Following [place]'s guidelines for the distribution of COVID-19 vaccines, we are pleased to share our plans to begin offering vaccines to those 75 and older (Phase 1b). Here are details of those plans:  [place] COVID-19 Vaccination [facility] . Appointments required. [facility] Open to those age 41 or older . Not restricted to [facility] or [facility] residents . Location: [place], [place], [place]  . Offered daily beginning: Saturday, March 31, 2019 . Hours: 8 a.m. to 1 p.m. (10 a.m. to 2 p.m. this Saturday and Sunday, Jan. 9 and 10) . Registration for vaccine clinic appointments is open as of 10 a.m., Friday, January 8 . Please visit  [email or web address] to register or call [phone] . There will be no copay required for the vaccine. Insurance information will be requested if available.  [place] will offer this vaccination clinic through Sunday, January 17, after which we will switch to a larger location to offer public vaccination at a larger scale following state guidelines. We will provide additional information as details become available.   Also, people who are 75 years and older can sign up to get vaccinated against COVID-19 at clinics that begin Monday through the [facility]. The vaccinations are open to all in that age group, regardless of their health condition or living situation.  Appointments are required and can be made beginning at 8 a.m. Friday by calling [phone] and selecting option 2. Walk-ins will not be accepted. Clinic locations are: [facility], [place]; [facility] [address], [place]; . [place] at [facility], [address], [place].  Participants are asked to wear a face covering at vaccination sites. Visit [email or web address] and click on the "[email or web address] Vaccine Info" rectangle for more information about vaccinations.  In addition to the clinics above, we are working in partnership with county health agencies in [place], [place], [place] and [place] counties to ensure continuing vaccination availability in alignment with state guidelines in the weeks and months ahead.   Information on phase 1b COVID-19 vaccination clinics being offered  by local county health agencies is provided in the website links below for your convenience:   . [place]  . [place]  . [place] . [place]  . [place]'s phase 1b vaccination guidelines, prioritizing those 75 and over as the next  eligible group to receive the COVID-19 vaccine, are detailed at [email or web address].   Additional information: Those 75 and older who register for [place]'s COVID-19 vaccination clinic at [facility] will be provided with additional information, including the need to be observed for 15 minutes following vaccination for your safety. Our COVID-19 testing clinic will not start at this site until 2 p.m. daily to ensure no people arriving for vaccination intersect with those seeking a COVID-19 test. [place] rooms at the [facility] are clean and safe, and our staff will use all appropriate protective equipment to keep you safe. This vaccine clinic will be located in a completely separate area of this facility than where health care is provided. No interaction will occur between patients or care teams at this site and our vaccination clinic.   As we proceed through phases of the vaccine rollout, we remind everyone to remain vigilant in practicing the 3 W's - wear a mask, wash your hands and wait 6 feet apart from others. These safety practices are based in science and are the best tool we have to reduce the spread of the virus.   For our most current information, please visit [email or web address].

## 2019-04-03 NOTE — Telephone Encounter (Signed)
Fyi called patient and added for today at 10:40

## 2019-04-03 NOTE — Progress Notes (Signed)
Michael, Bologna Rivera ([id number]) Visit Report for 03/30/2019 Arrival Information Details Patient Name: Date of Service: [name], [name] 03/30/2019 2:30 PM Medical Record [id number] Patient Account Number: [id number] Date of Birth/Sex: Treating RN: 02-06-1941 (79 y.o. Janyth Contes Primary Care : Garret Reddish Other Clinician: Referring : Treating /Extender:Robson, Kandice Hams, Alexis Goodell in Treatment: 8 Visit Information History Since Last Visit Cane Added or deleted any medications: No Patient Arrived: 14:45 Any new allergies or adverse reactions: No Arrival Time: alone Had a fall or experienced change in No Accompanied By: None activities of daily living that may affect Transfer Assistance: risk of falls: Patient Identification Verified: Yes Signs or symptoms of abuse/neglect since last No Secondary Verification Process Completed: Yes visito Patient Requires Transmission-Based No Hospitalized since last visit: Yes Precautions: Implantable device outside of the clinic excluding No Patient Has Alerts: No cellular tissue based products placed in the center since last visit: Has Dressing in Place as Prescribed: Yes Has Compression in Place as Prescribed: Yes Pain Present Now: No Electronic Signature(s) Signed: 04/03/2019 6:18:44 PM By: [name] RN, BSN Entered By: [name] on 03/30/2019 14:51:01 -------------------------------------------------------------------------------- Compression Therapy Details Patient Name: Date of Service: Rivera, Michael [name] 03/30/2019 2:30 PM Medical Record [id number] Patient Account Number: [id number] Date of Birth/Sex: Treating RN: Mar 02, 1941 (78 y.o. [name] Primary Care : Garret Reddish Other Clinician: Referring : Treating /Extender:Robson, Kandice Hams, Alexis Goodell in Treatment: 8 Compression Therapy Performed for Wound Wound #1 Left,Posterior  Lower Leg Assessment: Performed By: Clinician Kela Millin, RN Compression Type: Three Layer Post Procedure Diagnosis Same as Pre-procedure Electronic Signature(s) Signed: 04/02/2019 5:57:16 PM By: Kela Millin Entered By: Kela Millin on 03/30/2019 15:16:03 -------------------------------------------------------------------------------- Encounter Discharge Information Details Patient Name: Date of Service: Michael, [name] 03/30/2019 2:30 PM Medical Record [id number] Patient Account Number: [id number] Date of Birth/Sex: Treating RN: Mar 28, 1940 (79 y.o. Janyth Contes Primary Care : Garret Reddish Other Clinician: Referring : Treating /Extender:Robson, Kandice Hams, Alexis Goodell in Treatment: 8 Encounter Discharge Information Items Post Procedure Vitals Discharge Condition: Stable Temperature (F): 98.0 Ambulatory Status: Cane Pulse (bpm): 109 Discharge Destination: Home Respiratory Rate (breaths/min): 20 Transportation: Private Auto Blood Pressure (mmHg): 118/64 Accompanied By: alone Schedule Follow-up Appointment: Yes Clinical Summary of Care: Patient Declined Electronic Signature(s) Signed: 04/03/2019 6:18:44 PM By: [name] RN, BSN Entered By: [name] on 03/30/2019 16:33:33 -------------------------------------------------------------------------------- Lower Extremity Assessment Details Patient Name: Date of Service: Rivera, [name] 03/30/2019 2:30 PM Medical Record [id number] Patient Account Number: [id number] Date of Birth/Sex: Treating RN: 08-Jul-1940 (78 y.o. Janyth Contes Primary Care : Garret Reddish Other Clinician: Referring : Treating /Extender:Robson, Kandice Hams, Alexis Goodell in Treatment: 8 Edema Assessment Assessed: [Left: No] [Right: No] Edema: [Left: Ye] [Right: s] Calf Left: Right: Point of Measurement: 31 cm From Medial Instep 36.5 cm  cm Ankle Left: Right: Point of Measurement: 12 cm From Medial Instep 24 cm cm Vascular Assessment Pulses: Dorsalis Pedis Palpable: [Left:Yes] Electronic Signature(s) Signed: 04/03/2019 6:18:44 PM By: [name] RN, BSN Entered By: [name] on 03/30/2019 14:56:13 -------------------------------------------------------------------------------- Multi Wound Chart Details Patient Name: Date of Service: [name], [name] 03/30/2019 2:30 PM Medical Record [id number] Patient Account Number: [id number] Date of Birth/Sex: Treating RN: May 31, 1940 (79 y.o. M) Primary Care : Garret Reddish Other Clinician: Referring : Treating /Extender:Robson, Kandice Hams, Alexis Goodell in Treatment: 8 Vital Signs Height(in): 74 Pulse(bpm): 109 Weight(lbs): 180 Blood Pressure(mmHg): 118/64 Body Mass Index(BMI): 23 Temperature(F): 98.0 Respiratory 20 Rate(breaths/min): Photos: [1:No Photos] [N/A:N/A] Wound Location: [1:Left Lower Leg - Posterior N/A] Wounding  Event: [1:Trauma] [N/A:N/A] Primary Etiology: [1:Venous Leg Ulcer] [N/A:N/A] Comorbid History: [1:Cataracts, Sleep Apnea, Congestive Heart Failure, Peripheral Venous Disease, Osteoarthritis, Neuropathy] [N/A:N/A] Date Acquired: [1:11/29/2018] [N/A:N/A] Weeks of Treatment: [1:8] [N/A:N/A] Wound Status: [1:Open] [N/A:N/A] Measurements L x W x D 1.6x1.2x0.1 [N/A:N/A] (cm) Area (cm) : [1:1.508] [N/A:N/A] Volume (cm) : [1:0.151] [N/A:N/A] % Reduction in Area: [1:87.70%] [N/A:N/A] % Reduction in Volume: [1:93.90%] [N/A:N/A] Classification: [1:Full Thickness Without Exposed Support Structures] [N/A:N/A] Exudate Amount: [1:Medium] [N/A:N/A] Exudate Type: [1:Serosanguineous] [N/A:N/A] Exudate Color: [1:red, brown] [N/A:N/A] Wound Margin: [1:Flat and Intact] [N/A:N/A] Granulation Amount: [1:Large (67-100%)] [N/A:N/A] Granulation Quality: [1:Red] [N/A:N/A] Necrotic Amount: [1:Small (1-33%)]  [N/A:N/A] Exposed Structures: [1:Fat Layer (Subcutaneous N/A Tissue) Exposed: Yes Fascia: No Tendon: No Muscle: No Joint: No Bone: No] Epithelialization: [1:Medium (34-66%)] [N/A:N/A] Debridement: [1:Debridement - Selective/Open Wound] [N/A:N/A] Pre-procedure [1:15:11] [N/A:N/A] Verification/Time Out Taken: Pain Control: [1:Other] [N/A:N/A] Tissue Debrided: [1:Necrotic/Eschar] [N/A:N/A] Level: [1:Skin/Dermis] [N/A:N/A] Debridement Area (sq cm):1 [N/A:N/A] Instrument: [1:Curette] [N/A:N/A] Bleeding: [1:Minimum] [N/A:N/A] Hemostasis Achieved: [1:Pressure] [N/A:N/A] Procedural Pain: [1:0] [N/A:N/A] Post Procedural Pain: [1:0] [N/A:N/A] Debridement Treatment Procedure was tolerated [N/A:N/A] Response: [1:well] Post Debridement [1:1.6x1.2x0.1] [N/A:N/A] Measurements L x W x D (cm) Post Debridement [1:0.151] [N/A:N/A] Volume: (cm) Procedures Performed: Debridement [N/A:N/A] Treatment Notes Electronic Signature(s) Signed: 03/30/2019 4:35:49 PM By: Linton Ham MD Entered By: Linton Ham on 03/30/2019 15:15:08 -------------------------------------------------------------------------------- Multi-Disciplinary Care Plan Details Patient Name: Date of Service: Calix, [name] 03/30/2019 2:30 PM Medical Record [id number] Patient Account Number: [id number] Date of Birth/Sex: Treating RN: 06-25-1940 (79 y.o. [name] Primary Care : Garret Reddish Other Clinician: Referring : Treating /Extender:Robson, Kandice Hams, Alexis Goodell in Treatment: 8 Active Inactive Venous Leg Ulcer Nursing Diagnoses: Knowledge deficit related to disease process and management Potential for venous Insuffiency (use before diagnosis confirmed) Goals: Patient will maintain optimal edema control Date Initiated: 01/31/2019 Target Resolution Date: 04/27/2019 Goal Status: Active Interventions: Assess peripheral edema status every visit. Compression as  ordered Treatment Activities: Therapeutic compression applied : 01/31/2019 Notes: Wound/Skin Impairment Nursing Diagnoses: Impaired tissue integrity Knowledge deficit related to ulceration/compromised skin integrity Goals: Patient/caregiver will verbalize understanding of skin care regimen Date Initiated: 01/31/2019 Target Resolution Date: 04/27/2019 Goal Status: Active Ulcer/skin breakdown will have a volume reduction of 30% by week 4 Date Initiated: 01/31/2019 Date Inactivated: 03/07/2019 Target Resolution Date: 02/28/2019 Goal Status: Met Ulcer/skin breakdown will have a volume reduction of 50% by week 8 Date Initiated: 03/07/2019 Target Resolution Date: 04/27/2019 Goal Status: Active Interventions: Assess patient/caregiver ability to obtain necessary supplies Assess patient/caregiver ability to perform ulcer/skin care regimen upon admission and as needed Assess ulceration(s) every visit Treatment Activities: Skin care regimen initiated : 01/31/2019 Topical wound management initiated : 01/31/2019 Notes: Electronic Signature(s) Signed: 03/30/2019 2:58:22 PM By: Kela Millin Entered By: Kela Millin on 03/30/2019 14:44:35 -------------------------------------------------------------------------------- Pain Assessment Details Patient Name: Date of Service: Juliocesar, Blasius Yazir 03/30/2019 2:30 PM Medical Record [id number] Patient Account Number: [id number] Date of Birth/Sex: Treating RN: Dec 29, 1940 (79 y.o. Janyth Contes Primary Care : Garret Reddish Other Clinician: Referring : Treating /Extender:Robson, Kandice Hams, Alexis Goodell in Treatment: 8 Active Problems Location of Pain Severity and Description of Pain Patient Has Paino No Site Locations Pain Management and Medication Current Pain Management: Electronic Signature(s) Signed: 04/03/2019 6:18:44 PM By: [name] RN, BSN Entered By: [name] on 03/30/2019  14:51:17 -------------------------------------------------------------------------------- Patient/Caregiver Education Details Patient Name: Date of Service: [name], [name] 1/8/2021andnbsp2:30 PM Medical Record [id number] Patient Account Number: [id number] Date of Birth/Gender: 1940/09/24 (78 y.o. M) Treating RN: Kela Millin Primary Care Physician: Yong Channel,  Annie Main Other Clinician: Referring Physician: Treating Physician/Extender:Robson, Kandice Hams, Alexis Goodell in Treatment: 8 Education Assessment Education Provided To: Patient Education Topics Provided Wound/Skin Impairment: Methods: Explain/Verbal Responses: State content correctly Electronic Signature(s) Signed: 03/30/2019 2:58:22 PM By: Kela Millin Entered By: Kela Millin on 03/30/2019 14:44:59 -------------------------------------------------------------------------------- Wound Assessment Details Patient Name: Date of Service: Amaan, [name] 03/30/2019 2:30 PM Medical Record [id number] Patient Account Number: [id number] Date of Birth/Sex: Treating RN: [birth year] (78 y.o. Janyth Contes Primary Care Calani Gick: Garret Reddish Other Clinician: Referring Ronee Ranganathan: Treating Hannia Matchett/Extender:Robson, Kandice Hams, Alexis Goodell in Treatment: 8 Wound Status Wound Number: 1 Primary Venous Leg Ulcer Etiology: Wound Location: Left Lower Leg - Posterior Wound Open Wounding Event: Trauma Status: Date Acquired: 11/29/2018 Comorbid Cataracts, Sleep Apnea, Congestive Heart Weeks Of Treatment: 8 History: Failure, Peripheral Venous Disease, Clustered Wound: No Osteoarthritis, Neuropathy Photos Wound Measurements Length: (cm) 1.6 % Reducti Width: (cm) 1.2 % Reducti Depth: (cm) 0.1 Epithelia Area: (cm) 1.508 Tunnelin Volume: (cm) 0.151 Undermin Wound Description Full Thickness Without Exposed Support Foul Od Classification: Structures Slough/ Wound Flat and Intact Margin: Exudate  Medium Amount: Exudate Serosanguineous Type: Exudate red, brown Color: Wound Bed Granulation Amount: Large (67-100%) Granulation Quality: Red Fascia E Necrotic Amount: Small (1-33%) Fat Laye Necrotic Quality: Adherent Slough Tendon E Muscle E Joint Ex Bone Exp or After Cleansing: No Fibrino Yes Exposed Structure xposed: No r (Subcutaneous Tissue) Exposed: Yes xposed: No xposed: No posed: No osed: No on in Area: 87.7% on in Volume: 93.9% lization: Medium (34-66%) g: No ing: No Treatment Notes Wound #1 (Left, Posterior Lower Leg) 1. Cleanse With Soap and water 2. Periwound Care Moisturizing lotion 3. Primary Dressing Applied Collegen AG 4. Secondary Dressing ABD Pad Dry Gauze 6. Support Layer Applied 3 layer compression wrap Notes netting. Electronic Signature(s) Signed: 04/03/2019 3:22:35 PM By: Mikeal Hawthorne EMT/HBOT Signed: 04/03/2019 6:18:44 PM By: [name] RN, BSN Entered By: Mikeal Hawthorne on 04/03/2019 14:35:18 -------------------------------------------------------------------------------- [employer] Details Patient Name: Date of Service: [name], Tirrell 03/30/2019 2:30 PM Medical Record [id number] Patient Account Number: [id number] Date of Birth/Sex: Treating RN: 10-23-1940 (79 y.o. Janyth Contes Primary Care Sophiea Ueda: Garret Reddish Other Clinician: Referring Deanda Ruddell: Treating Kyzen Horn/Extender:Robson, Kandice Hams, Alexis Goodell in Treatment: 8 Vital Signs Time Taken: 14:50 Temperature (F): 98.0 Height (in): 74 Pulse (bpm): 109 Weight (lbs): 180 Respiratory Rate (breaths/min): 20 Body Mass Index (BMI): 23.1 Blood Pressure (mmHg): 118/64 Reference Range: 80 - 120 mg / dl Electronic Signature(s) Signed: 04/03/2019 6:18:44 PM By: [name] RN, BSN Entered By: [name] on 03/30/2019 14:51:13

## 2019-04-03 NOTE — Telephone Encounter (Signed)
When I was on phone with patient I have reviewed covid questions. His SOB has been ongoing for around 3 months. He has not had any exposure.

## 2019-04-04 ENCOUNTER — Other Ambulatory Visit: Payer: Self-pay | Admitting: *Deleted

## 2019-04-04 DIAGNOSIS — S42295A Other nondisplaced fracture of upper end of left humerus, initial encounter for closed fracture: Secondary | ICD-10-CM | POA: Diagnosis not present

## 2019-04-04 NOTE — Patient Outreach (Signed)
Telephone outreach to follow up on RED FLAG on EMMI call: sad, hopeless, anxious, depression.  Called and left a message to return my call.  Eulah Pont. Myrtie Neither, MSN, [facility] Gerontological Nurse Practitioner [facility] Care Management [phone]

## 2019-04-04 NOTE — Telephone Encounter (Signed)
Called number provided no answer v/m was set up but could not tell if was for PT no message left.

## 2019-04-05 ENCOUNTER — Other Ambulatory Visit: Payer: Self-pay | Admitting: *Deleted

## 2019-04-05 DIAGNOSIS — D631 Anemia in chronic kidney disease: Secondary | ICD-10-CM | POA: Diagnosis not present

## 2019-04-05 DIAGNOSIS — N184 Chronic kidney disease, stage 4 (severe): Secondary | ICD-10-CM | POA: Diagnosis not present

## 2019-04-05 DIAGNOSIS — M1030 Gout due to renal impairment, unspecified site: Secondary | ICD-10-CM | POA: Diagnosis not present

## 2019-04-05 DIAGNOSIS — I130 Hypertensive heart and chronic kidney disease with heart failure and stage 1 through stage 4 chronic kidney disease, or unspecified chronic kidney disease: Secondary | ICD-10-CM | POA: Diagnosis not present

## 2019-04-05 DIAGNOSIS — I5033 Acute on chronic diastolic (congestive) heart failure: Secondary | ICD-10-CM | POA: Diagnosis not present

## 2019-04-05 NOTE — Patient Outreach (Signed)
Second outreach to follow up on RED FLAG on Emmi discharge call.  Left a message to return my call.  If I do receive a return call I will send a letter for his information about our services.  Eulah Pont. Myrtie Neither, MSN, [facility] Gerontological Nurse Practitioner [facility] Care Management [phone]

## 2019-04-06 ENCOUNTER — Encounter (HOSPITAL_BASED_OUTPATIENT_CLINIC_OR_DEPARTMENT_OTHER): Payer: Medicare Other | Admitting: Internal Medicine

## 2019-04-06 ENCOUNTER — Other Ambulatory Visit: Payer: Self-pay

## 2019-04-06 ENCOUNTER — Encounter: Payer: Self-pay | Admitting: *Deleted

## 2019-04-06 DIAGNOSIS — I130 Hypertensive heart and chronic kidney disease with heart failure and stage 1 through stage 4 chronic kidney disease, or unspecified chronic kidney disease: Secondary | ICD-10-CM | POA: Diagnosis not present

## 2019-04-06 DIAGNOSIS — G9009 Other idiopathic peripheral autonomic neuropathy: Secondary | ICD-10-CM | POA: Diagnosis not present

## 2019-04-06 DIAGNOSIS — G629 Polyneuropathy, unspecified: Secondary | ICD-10-CM | POA: Diagnosis not present

## 2019-04-06 DIAGNOSIS — M1030 Gout due to renal impairment, unspecified site: Secondary | ICD-10-CM | POA: Diagnosis not present

## 2019-04-06 DIAGNOSIS — G4730 Sleep apnea, unspecified: Secondary | ICD-10-CM | POA: Diagnosis not present

## 2019-04-06 DIAGNOSIS — L97222 Non-pressure chronic ulcer of left calf with fat layer exposed: Secondary | ICD-10-CM | POA: Diagnosis not present

## 2019-04-06 DIAGNOSIS — I509 Heart failure, unspecified: Secondary | ICD-10-CM | POA: Diagnosis not present

## 2019-04-06 DIAGNOSIS — I890 Lymphedema, not elsewhere classified: Secondary | ICD-10-CM | POA: Diagnosis not present

## 2019-04-06 DIAGNOSIS — D631 Anemia in chronic kidney disease: Secondary | ICD-10-CM | POA: Diagnosis not present

## 2019-04-06 DIAGNOSIS — L97822 Non-pressure chronic ulcer of other part of left lower leg with fat layer exposed: Secondary | ICD-10-CM | POA: Diagnosis not present

## 2019-04-06 DIAGNOSIS — M1990 Unspecified osteoarthritis, unspecified site: Secondary | ICD-10-CM | POA: Diagnosis not present

## 2019-04-06 DIAGNOSIS — I872 Venous insufficiency (chronic) (peripheral): Secondary | ICD-10-CM | POA: Diagnosis not present

## 2019-04-06 DIAGNOSIS — N184 Chronic kidney disease, stage 4 (severe): Secondary | ICD-10-CM | POA: Diagnosis not present

## 2019-04-06 DIAGNOSIS — I5033 Acute on chronic diastolic (congestive) heart failure: Secondary | ICD-10-CM | POA: Diagnosis not present

## 2019-04-06 NOTE — Progress Notes (Signed)
[name], [name] ([id number]) Visit Report for 04/06/2019 Debridement Details Patient Name: Date of Service: GEROD, CALIGIURI 04/06/2019 2:45 PM Medical Record [id number] Patient Account Number: [id number] Date of Birth/Sex: 04-12-1940 (79 y.o. M) Treating RN: Primary Care Provider: Garret Reddish Other Clinician: Referring Provider: Treating Provider/Extender:Jalisa Sacco, Kandice Hams, Alexis Goodell in Treatment: 9 Debridement Performed for Wound #1 Left,Posterior Lower Leg Assessment: Performed By: Physician Ricard Dillon., MD Debridement Type: Debridement Severity of Tissue Pre Fat layer exposed Debridement: Level of Consciousness (Pre- Awake and Alert procedure): Pre-procedure Verification/Time Out Taken: Yes - 16:27 Start Time: 16:27 Pain Control: Other : benzocaine 20% spray Total Area Debrided (L x W): 0.9 (cm) x 0.6 (cm) = 0.54 (cm) Tissue and other material Viable, Non-Viable, Callus, Subcutaneous debrided: Level: Skin/Subcutaneous Tissue Debridement Description: Excisional Instrument: Curette Bleeding: Minimum Hemostasis Achieved: Pressure End Time: 16:27 Procedural Pain: 1 Post Procedural Pain: 0 Response to Treatment: Procedure was tolerated well Level of Consciousness Awake and Alert (Post-procedure): Post Debridement Measurements of Total Wound Length: (cm) 0.9 Width: (cm) 0.6 Depth: (cm) 0.1 Volume: (cm) 0.042 Character of Wound/Ulcer Post Improved Debridement: Severity of Tissue Post Debridement: Fat layer exposed Post Procedure Diagnosis Same as Pre-procedure Electronic Signature(s) Signed: 04/06/2019 6:20:19 PM By: Linton Ham MD Previous Signature: 04/06/2019 5:57:08 PM Version By: Kela Millin Entered By: Linton Ham on 04/06/2019 17:57:45 -------------------------------------------------------------------------------- HPI Details Patient Name: Date of Service: [name], [name] 04/06/2019 2:45 PM Medical Record  [id number] Patient Account Number: [id number] Date of Birth/Sex: Treating RN: [birth year] (79 y.o. M) Primary Care Provider: Garret Reddish Other Clinician: Referring Provider: Treating Provider/Extender:Carime Dinkel, Kandice Hams, Alexis Goodell in Treatment: 9 History of Present Illness HPI Description: 01/31/2019 on evaluation today patient actually appears to be doing poorly unfortunately secondary to an injury that occurred when he hit his leg on a car door September 9. He was seen in the ER where he had 13 sutures placed. His primary care provider remove those 2 weeks later and unfortunately the wound began to dehisce following. It appears that he may have had a skin flap that just did not take and survive. Nonetheless he was stated to have had Keflex given from the ER though the patient states he does not even remember that. He also is stated to have taken 2 rounds of doxycycline by his primary care provider but again the patient states he only remembers the most recent antibiotic which was actually the Augmentin then switched to Bactrim based on the culture results. The culture showed that he had Morganella Morgagni noted in the culture. Subsequently when he was taking the Bactrim he states that the redness got much better but then unfortunately worsened again once he came off of the medication. Apparently the patient was given triamcinolone ointment to put on the wound although obviously that really did not do much for the wound and again the patient tells me that he was "given the wrong ointment". Again I am not exactly sure what was going on in that scenario 1 where another. Either way I do believe that he has necrotic tissue on the surface of the wound this can require some debridement if he is able to tolerate it and then subsequently he is also get a need likely a refill of the Bactrim in order to help treat the infection I do not believe this is completely cleared up at this  point. I also think that he really needs to have edema control again he is very swollen and I believe that Performing a compression wrap  will also be of great benefit for him. The patient does have chronic kidney disease stage IV along with peripheral neuropathy secondary to issues with his back. 02/07/2019 on evaluation today patient appears to be doing a little better with regard to the overall appearance of the wound although it appears to be very dry. Fortunately there is no signs of active infection at this time. No fever chills noted. I really feel like that the patient may be having issues at this point with the Iodoflex being too absorptive for him at this point which is causing this to dry out. I think we may want to try something different to see if that would be of benefit for him. 02/14/2019 on evaluation today patient actually appears to be doing much better with regard to his wound at this time. He has been tolerating the dressing changes without complication. Fortunately everything appears to be doing much better from the standpoint of how soft the dead tissue is today in fact I was able to debride away some of the necrotic tissue because it was doing so well. Fortunately he is showing signs of good improvement as of this week. 02/21/2019 on evaluation today patient feels like he is actually doing a little bit worse noticing the redness on his lower extremity. I agree I have not seen this before and I am concerned about the possibility of infection. I think we may want to initiate something such as doxycycline and possibly wound culture today to see what exactly may be going on here. 02/28/19 evaluation today patient appears to be doing better with regard to his wound. Hes been tolerating the dressing changes without complication. Fortunately theres no signs of active infection at this time. Overall I feel like the doxycycline has also been helpful for him. 03/07/2019 on evaluation  today patient appears to be doing very well with regard to his lower extremity ulcer which is showing excellent improvement. Fortunately there is no evidence of active infection which is good news. No fevers, chills, nausea, vomiting, or diarrhea. Overall I feel like he is making excellent progress towards closure. 12/28; since the patient was last here he fell and apparently fractured his shoulder in 2 places. He is seeing orthopedics. Kept his wrap on for 2 weeks. He has a venous insufficiency wound on the posterior left calf. We have been using silver collagen under 3 layer compression 03/30/2019. Patient stopped taking his torsemide because he was going to the bathroom frequently which was difficult because of his fractured shoulder. He was apparently admitted to hospital for congestive heart failure. He tells [place] that they changed his compression on the left leg twice. His wound is measuring smaller. We have been using silver collagen 1/15; in general his leg looks better today. Still a necrotic surface on the posterior left leg. He has severe chronic venous insufficiency. He did bring in his stockings they look like they are 20/30 below-knee. I have encouraged them to use this on the right leg which has severe wound threatening edema Electronic Signature(s) Signed: 04/06/2019 6:20:19 PM By: Linton Ham MD Entered By: Linton Ham on 04/06/2019 17:58:29 -------------------------------------------------------------------------------- Physical Exam Details Patient Name: Date of Service: [name], [name] 04/06/2019 2:45 PM Medical Record [id number] Patient Account Number: [id number] Date of Birth/Sex: Treating RN: 19-Sep-1940 (79 y.o. M) Primary Care Provider: Garret Reddish Other Clinician: Referring Provider: Treating Provider/Extender:Tylasia Fletchall, Kandice Hams, Alexis Goodell in Treatment: 9 Constitutional Sitting or standing Blood Pressure is within target range for patient..  Pulse regular  and within target range for patient.Marland Kitchen Respirations regular, non-labored and within target range.. Temperature is normal and within the target range for the patient.Marland Kitchen Appears in no distress. Cardiovascular Pedal pulses on the left are palpable. Notes Wound exam; left posterior leg. He has had quite a bit of healing here but still some eschar and skin and subcutaneous tissue from the wound. This opened up a clean area. May be closed by next week. Notable for the fact that he has wound threatening edema in the right leg I strongly encouraged the in's compression stockings if he can get them on Electronic Signature(s) Signed: 04/06/2019 6:20:19 PM By: Linton Ham MD Entered By: Linton Ham on 04/06/2019 18:01:08 -------------------------------------------------------------------------------- Physician Orders Details Patient Name: Date of Service: [name], [name] 04/06/2019 2:45 PM Medical Record [id number] Patient Account Number: [id number] Date of Birth/Sex: Treating RN: 27-Sep-1940 (79 y.o. [name] Primary Care Provider: Garret Reddish Other Clinician: Referring Provider: Treating Provider/Extender:Avishai Reihl, Kandice Hams, Alexis Goodell in Treatment: 9 Verbal / Phone Orders: No Diagnosis Coding ICD-10 Coding Code Description I87.2 Venous insufficiency (chronic) (peripheral) S81.802A Unspecified open wound, left lower leg, initial encounter L97.822 Non-pressure chronic ulcer of other part of left lower leg with fat layer exposed N18.4 Chronic kidney disease, stage 4 (severe) G90.09 Other idiopathic peripheral autonomic neuropathy Follow-up Appointments Return Appointment in 1 week. - Wednesday with Margarita Grizzle Dressing Change Frequency Wound #1 Left,Posterior Lower Leg Do not change entire dressing for one week. Skin Barriers/Peri-Wound Care Wound #1 Left,Posterior Lower Leg Moisturizing lotion - to leg Wound Cleansing Wound #1 Left,Posterior  Lower Leg May shower with protection. Primary Wound Dressing Wound #1 Left,Posterior Lower Leg Silver Collagen - moisten with hydrogel Secondary Dressing Wound #1 Left,Posterior Lower Leg Dry Gauze ABD pad Edema Control 3 Layer Compression System - Left Lower Extremity Avoid standing for long periods of time Elevate legs to the level of the heart or above for 30 minutes daily and/or when sitting, a frequency of: - throughout the week Exercise regularly Support Garment 20-30 mm/Hg pressure to: - wear stocking to right lower leg. apply in the morning and remove at night before bed Electronic Signature(s) Signed: 04/06/2019 5:57:08 PM By: Kela Millin Signed: 04/06/2019 6:20:19 PM By: Linton Ham MD Entered By: Kela Millin on 04/06/2019 16:31:15 -------------------------------------------------------------------------------- Problem List Details Patient Name: Date of Service: EYDAN, CHIANESE 04/06/2019 2:45 PM Medical Record [id number] Patient Account Number: [id number] Date of Birth/Sex: Treating RN: 29-Aug-1940 (79 y.o. [name] Primary Care Provider: Garret Reddish Other Clinician: Referring Provider: Treating Provider/Extender:Elesia Pemberton, Kandice Hams, Alexis Goodell in Treatment: 9 Active Problems ICD-10 Evaluated Encounter Code Description Active Date Today Diagnosis I87.2 Venous insufficiency (chronic) (peripheral) 01/31/2019 No Yes S81.802A Unspecified open wound, left lower leg, initial 01/31/2019 No Yes encounter L97.822 Non-pressure chronic ulcer of other part of left lower 01/31/2019 No Yes leg with fat layer exposed N18.4 Chronic kidney disease, stage 4 (severe) 01/31/2019 No Yes G90.09 Other idiopathic peripheral autonomic neuropathy 01/31/2019 No Yes Inactive Problems Resolved Problems Electronic Signature(s) Signed: 04/06/2019 6:20:19 PM By: Linton Ham MD Previous Signature: 04/06/2019 5:57:08 PM Version By: Kela Millin Entered By: Linton Ham on 04/06/2019 17:57:29 -------------------------------------------------------------------------------- Progress Note Details Patient Name: Date of Service: [name], King 04/06/2019 2:45 PM Medical Record [id number] Patient Account Number: [id number] Date of Birth/Sex: Treating RN: 1940/04/22 (79 y.o. M) Primary Care Provider: Garret Reddish Other Clinician: Referring Provider: Treating Provider/Extender:Jonn Chaikin, Kandice Hams, Alexis Goodell in Treatment: 9 Subjective History of Present Illness (HPI) 01/31/2019 on evaluation today patient actually appears to  be doing poorly unfortunately secondary to an injury that occurred when he hit his leg on a car door September 9. He was seen in the ER where he had 13 sutures placed. His primary care provider remove those 2 weeks later and unfortunately the wound began to dehisce following. It appears that he may have had a skin flap that just did not take and survive. Nonetheless he was stated to have had Keflex given from the ER though the patient states he does not even remember that. He also is stated to have taken 2 rounds of doxycycline by his primary care provider but again the patient states he only remembers the most recent antibiotic which was actually the Augmentin then switched to Bactrim based on the culture results. The culture showed that he had Morganella Morgagni noted in the culture. Subsequently when he was taking the Bactrim he states that the redness got much better but then unfortunately worsened again once he came off of the medication. Apparently the patient was given triamcinolone ointment to put on the wound although obviously that really did not do much for the wound and again the patient tells me that he was "given the wrong ointment". Again I am not exactly sure what was going on in that scenario 1 where another. Either way I do believe that he has necrotic tissue on the  surface of the wound this can require some debridement if he is able to tolerate it and then subsequently he is also get a need likely a refill of the Bactrim in order to help treat the infection I do not believe this is completely cleared up at this point. I also think that he really needs to have edema control again he is very swollen and I believe that Performing a compression wrap will also be of great benefit for him. The patient does have chronic kidney disease stage IV along with peripheral neuropathy secondary to issues with his back. 02/07/2019 on evaluation today patient appears to be doing a little better with regard to the overall appearance of the wound although it appears to be very dry. Fortunately there is no signs of active infection at this time. No fever chills noted. I really feel like that the patient may be having issues at this point with the Iodoflex being too absorptive for him at this point which is causing this to dry out. I think we may want to try something different to see if that would be of benefit for him. 02/14/2019 on evaluation today patient actually appears to be doing much better with regard to his wound at this time. He has been tolerating the dressing changes without complication. Fortunately everything appears to be doing much better from the standpoint of how soft the dead tissue is today in fact I was able to debride away some of the necrotic tissue because it was doing so well. Fortunately he is showing signs of good improvement as of this week. 02/21/2019 on evaluation today patient feels like he is actually doing a little bit worse noticing the redness on his lower extremity. I agree I have not seen this before and I am concerned about the possibility of infection. I think we may want to initiate something such as doxycycline and possibly wound culture today to see what exactly may be going on here. 02/28/19 evaluation today patient appears to be doing  better with regard to his wound. Heoos been tolerating the dressing changes without complication. Fortunately thereoos no signs  of active infection at this time. Overall I feel like the doxycycline has also been helpful for him. 03/07/2019 on evaluation today patient appears to be doing very well with regard to his lower extremity ulcer which is showing excellent improvement. Fortunately there is no evidence of active infection which is good news. No fevers, chills, nausea, vomiting, or diarrhea. Overall I feel like he is making excellent progress towards closure. 12/28; since the patient was last here he fell and apparently fractured his shoulder in 2 places. He is seeing orthopedics. Kept his wrap on for 2 weeks. He has a venous insufficiency wound on the posterior left calf. We have been using silver collagen under 3 layer compression 03/30/2019. Patient stopped taking his torsemide because he was going to the bathroom frequently which was difficult because of his fractured shoulder. He was apparently admitted to hospital for congestive heart failure. He tells [place] that they changed his compression on the left leg twice. His wound is measuring smaller. We have been using silver collagen 1/15; in general his leg looks better today. Still a necrotic surface on the posterior left leg. He has severe chronic venous insufficiency. He did bring in his stockings they look like they are 20/30 below-knee. I have encouraged them to use this on the right leg which has severe wound threatening edema Objective Constitutional Sitting or standing Blood Pressure is within target range for patient.. Pulse regular and within target range for patient.Marland Kitchen Respirations regular, non-labored and within target range.. Temperature is normal and within the target range for the patient.Marland Kitchen Appears in no distress. Vitals Time Taken: 3:51 PM, Height: 74 in, Weight: 180 lbs, BMI: 23.1, Temperature: 98 F, Pulse: 92  bpm, Respiratory Rate: 18 breaths/min, Blood Pressure: 108/74 mmHg. Cardiovascular Pedal pulses on the left are palpable. General Notes: Wound exam; left posterior leg. He has had quite a bit of healing here but still some eschar and skin and subcutaneous tissue from the wound. This opened up a clean area. May be closed by next week. ooNotable for the fact that he has wound threatening edema in the right leg I strongly encouraged the in's compression stockings if he can get them on Integumentary (Hair, Skin) Wound #1 status is Open. Original cause of wound was Trauma. The wound is located on the Left,Posterior Lower Leg. The wound measures 0.9cm length x 0.6cm width x 0.1cm depth; 0.424cm^2 area and 0.042cm^3 volume. There is Fat Layer (Subcutaneous Tissue) Exposed exposed. There is no tunneling or undermining noted. There is a medium amount of serosanguineous drainage noted. The wound margin is flat and intact. There is large (67-100%) red granulation within the wound bed. There is a small (1-33%) amount of necrotic tissue within the wound bed including Adherent Slough. Assessment Active Problems ICD-10 Venous insufficiency (chronic) (peripheral) Unspecified open wound, left lower leg, initial encounter Non-pressure chronic ulcer of other part of left lower leg with fat layer exposed Chronic kidney disease, stage 4 (severe) Other idiopathic peripheral autonomic neuropathy Procedures Wound #1 Pre-procedure diagnosis of Wound #1 is a Venous Leg Ulcer located on the Left,Posterior Lower Leg .Severity of Tissue Pre Debridement is: Fat layer exposed. There was a Excisional Skin/Subcutaneous Tissue Debridement with a total area of 0.54 sq cm performed by Ricard Dillon., MD. With the following instrument(s): Curette to remove Viable and Non-Viable tissue/material. Material removed includes Callus and Subcutaneous Tissue and after achieving pain control using Other (benzocaine 20% spray).  No specimens were taken. A time out was conducted at  16:27, prior to the start of the procedure. A Minimum amount of bleeding was controlled with Pressure. The procedure was tolerated well with a pain level of 1 throughout and a pain level of 0 following the procedure. Post Debridement Measurements: 0.9cm length x 0.6cm width x 0.1cm depth; 0.042cm^3 volume. Character of Wound/Ulcer Post Debridement is improved. Severity of Tissue Post Debridement is: Fat layer exposed. Post procedure Diagnosis Wound #1: Same as Pre-Procedure Pre-procedure diagnosis of Wound #1 is a Venous Leg Ulcer located on the Left,Posterior Lower Leg . There was a Three Layer Compression Therapy Procedure by [name], RN. Post procedure Diagnosis Wound #1: Same as Pre-Procedure Plan Follow-up Appointments: Return Appointment in 1 week. - Wednesday with Margarita Grizzle Dressing Change Frequency: Wound #1 Left,Posterior Lower Leg: Do not change entire dressing for one week. Skin Barriers/Peri-Wound Care: Wound #1 Left,Posterior Lower Leg: Moisturizing lotion - to leg Wound Cleansing: Wound #1 Left,Posterior Lower Leg: May shower with protection. Primary Wound Dressing: Wound #1 Left,Posterior Lower Leg: Silver Collagen - moisten with hydrogel Secondary Dressing: Wound #1 Left,Posterior Lower Leg: Dry Gauze ABD pad Edema Control: 3 Layer Compression System - Left Lower Extremity Avoid standing for long periods of time Elevate legs to the level of the heart or above for 30 minutes daily and/or when sitting, a frequency of: - throughout the week Exercise regularly Support Garment 20-30 mm/Hg pressure to: - wear stocking to right lower leg. apply in the morning and remove at night before bed 1. Continue with silver collagen moistened with hydrogel ABDs 3 layer compression Electronic Signature(s) Signed: 04/06/2019 6:20:19 PM By: Linton Ham MD Entered By: Linton Ham on 04/06/2019  18:01:50 -------------------------------------------------------------------------------- SuperBill Details Patient Name: Date of Service: [name], [name] 04/06/2019 Medical Record [id number] Patient Account Number: [id number] Date of Birth/Sex: Treating RN: Jul 12, 1940 (79 y.o. [name] Primary Care Provider: Garret Reddish Other Clinician: Referring Provider: Treating Provider/Extender:Adarrius Graeff, Kandice Hams, Alexis Goodell in Treatment: 9 Diagnosis Coding ICD-10 Codes Code Description I87.2 Venous insufficiency (chronic) (peripheral) S81.802A Unspecified open wound, left lower leg, initial encounter L97.822 Non-pressure chronic ulcer of other part of left lower leg with fat layer exposed N18.4 Chronic kidney disease, stage 4 (severe) G90.09 Other idiopathic peripheral autonomic neuropathy Facility Procedures CPT4 Code Description: [id number] 11042 - DEB SUBQ TISSUE 20 SQ CM/< ICD-10 Diagnosis Description L97.822 Non-pressure chronic ulcer of other part of left lower leg w Modifier: ith fat laye Quantity: 1 r exposed Physician Procedures CPT4 Code Description: [id number] 11042 - WC PHYS SUBQ TISS 20 SQ CM ICD-10 Diagnosis Description L97.822 Non-pressure chronic ulcer of other part of left lower leg w Modifier: ith fat laye Quantity: 1 r exposed Electronic Signature(s) Signed: 04/06/2019 6:20:19 PM By: Linton Ham MD Previous Signature: 04/06/2019 5:57:08 PM Version By: Kela Millin Entered By: Linton Ham on 04/06/2019 18:02:02

## 2019-04-10 DIAGNOSIS — K5730 Diverticulosis of large intestine without perforation or abscess without bleeding: Secondary | ICD-10-CM

## 2019-04-10 DIAGNOSIS — M25511 Pain in right shoulder: Secondary | ICD-10-CM

## 2019-04-10 DIAGNOSIS — M48061 Spinal stenosis, lumbar region without neurogenic claudication: Secondary | ICD-10-CM

## 2019-04-10 DIAGNOSIS — N401 Enlarged prostate with lower urinary tract symptoms: Secondary | ICD-10-CM

## 2019-04-10 DIAGNOSIS — M4316 Spondylolisthesis, lumbar region: Secondary | ICD-10-CM

## 2019-04-10 DIAGNOSIS — B029 Zoster without complications: Secondary | ICD-10-CM

## 2019-04-10 DIAGNOSIS — Z86010 Personal history of colonic polyps: Secondary | ICD-10-CM

## 2019-04-10 DIAGNOSIS — I87329 Chronic venous hypertension (idiopathic) with inflammation of unspecified lower extremity: Secondary | ICD-10-CM

## 2019-04-10 DIAGNOSIS — Z96611 Presence of right artificial shoulder joint: Secondary | ICD-10-CM

## 2019-04-10 DIAGNOSIS — F3131 Bipolar disorder, current episode depressed, mild: Secondary | ICD-10-CM

## 2019-04-10 DIAGNOSIS — N184 Chronic kidney disease, stage 4 (severe): Secondary | ICD-10-CM | POA: Diagnosis not present

## 2019-04-10 DIAGNOSIS — I479 Paroxysmal tachycardia, unspecified: Secondary | ICD-10-CM

## 2019-04-10 DIAGNOSIS — K8020 Calculus of gallbladder without cholecystitis without obstruction: Secondary | ICD-10-CM

## 2019-04-10 DIAGNOSIS — I130 Hypertensive heart and chronic kidney disease with heart failure and stage 1 through stage 4 chronic kidney disease, or unspecified chronic kidney disease: Secondary | ICD-10-CM | POA: Diagnosis not present

## 2019-04-10 DIAGNOSIS — D692 Other nonthrombocytopenic purpura: Secondary | ICD-10-CM

## 2019-04-10 DIAGNOSIS — M25512 Pain in left shoulder: Secondary | ICD-10-CM

## 2019-04-10 DIAGNOSIS — Z96651 Presence of right artificial knee joint: Secondary | ICD-10-CM

## 2019-04-10 DIAGNOSIS — G4733 Obstructive sleep apnea (adult) (pediatric): Secondary | ICD-10-CM

## 2019-04-10 DIAGNOSIS — I5033 Acute on chronic diastolic (congestive) heart failure: Secondary | ICD-10-CM | POA: Diagnosis not present

## 2019-04-10 DIAGNOSIS — Z466 Encounter for fitting and adjustment of urinary device: Secondary | ICD-10-CM

## 2019-04-10 DIAGNOSIS — E662 Morbid (severe) obesity with alveolar hypoventilation: Secondary | ICD-10-CM

## 2019-04-10 DIAGNOSIS — M1711 Unilateral primary osteoarthritis, right knee: Secondary | ICD-10-CM

## 2019-04-10 DIAGNOSIS — K760 Fatty (change of) liver, not elsewhere classified: Secondary | ICD-10-CM

## 2019-04-10 DIAGNOSIS — R000 Tachycardia, unspecified: Secondary | ICD-10-CM

## 2019-04-10 DIAGNOSIS — M1030 Gout due to renal impairment, unspecified site: Secondary | ICD-10-CM | POA: Diagnosis not present

## 2019-04-10 DIAGNOSIS — Z9181 History of falling: Secondary | ICD-10-CM

## 2019-04-10 DIAGNOSIS — Z9861 Coronary angioplasty status: Secondary | ICD-10-CM

## 2019-04-10 DIAGNOSIS — Z792 Long term (current) use of antibiotics: Secondary | ICD-10-CM

## 2019-04-10 DIAGNOSIS — G8929 Other chronic pain: Secondary | ICD-10-CM

## 2019-04-10 DIAGNOSIS — S42202D Unspecified fracture of upper end of left humerus, subsequent encounter for fracture with routine healing: Secondary | ICD-10-CM

## 2019-04-10 DIAGNOSIS — Z9089 Acquired absence of other organs: Secondary | ICD-10-CM

## 2019-04-10 DIAGNOSIS — Z6832 Body mass index (BMI) 32.0-32.9, adult: Secondary | ICD-10-CM

## 2019-04-10 DIAGNOSIS — M810 Age-related osteoporosis without current pathological fracture: Secondary | ICD-10-CM

## 2019-04-10 DIAGNOSIS — G629 Polyneuropathy, unspecified: Secondary | ICD-10-CM

## 2019-04-10 DIAGNOSIS — D631 Anemia in chronic kidney disease: Secondary | ICD-10-CM | POA: Diagnosis not present

## 2019-04-10 DIAGNOSIS — N3941 Urge incontinence: Secondary | ICD-10-CM

## 2019-04-10 DIAGNOSIS — E039 Hypothyroidism, unspecified: Secondary | ICD-10-CM

## 2019-04-11 DIAGNOSIS — I130 Hypertensive heart and chronic kidney disease with heart failure and stage 1 through stage 4 chronic kidney disease, or unspecified chronic kidney disease: Secondary | ICD-10-CM | POA: Diagnosis not present

## 2019-04-11 DIAGNOSIS — I5033 Acute on chronic diastolic (congestive) heart failure: Secondary | ICD-10-CM | POA: Diagnosis not present

## 2019-04-11 DIAGNOSIS — D631 Anemia in chronic kidney disease: Secondary | ICD-10-CM | POA: Diagnosis not present

## 2019-04-11 DIAGNOSIS — M1030 Gout due to renal impairment, unspecified site: Secondary | ICD-10-CM | POA: Diagnosis not present

## 2019-04-11 DIAGNOSIS — N184 Chronic kidney disease, stage 4 (severe): Secondary | ICD-10-CM | POA: Diagnosis not present

## 2019-04-11 NOTE — Progress Notes (Signed)
Rivera, Michael ([id number]) Visit Report for 04/06/2019 Arrival Information Details Patient Name: Date of Service: [name], [name] 04/06/2019 2:45 PM Medical Record [id number] Patient Account Number: [id number] Date of Birth/Sex: Treating RN: 01/02/1941 (78 y.o. Jerilynn Mages) Carlene Coria Primary Care Zekiel Torian: Garret Reddish Other Clinician: Referring Meet Weathington: Treating Jacque Garrels/Extender:Robson, Kandice Hams, Alexis Goodell in Treatment: 9 Visit Information History Since Last Visit Cane All ordered tests and consults were completed: No Patient Arrived: 15:50 Added or deleted any medications: No Arrival Time: Any new allergies or adverse reactions: No Accompanied By: self None Had a fall or experienced change in No Transfer Assistance: activities of daily living that may affect Patient Identification Verified: Yes risk of falls: Secondary Verification Process Completed: Yes Signs or symptoms of abuse/neglect since last No Patient Requires Transmission-Based No visito Precautions: Hospitalized since last visit: No Patient Has Alerts: No Implantable device outside of the clinic excluding No cellular tissue based products placed in the center since last visit: Has Dressing in Place as Prescribed: Yes Has Compression in Place as Prescribed: Yes Pain Present Now: No Electronic Signature(s) Signed: 04/11/2019 5:42:11 PM By: Carlene Coria RN Entered By: Carlene Coria on 04/06/2019 15:51:02 -------------------------------------------------------------------------------- Compression Therapy Details Patient Name: Date of Service: [name], [name] 04/06/2019 2:45 PM Medical Record [id number] Patient Account Number: [id number] Date of Birth/Sex: Treating RN: 1940-10-30 (80 y.o. [name] Primary Care Chrisangel Eskenazi: Garret Reddish Other Clinician: Referring Crystalynn Mcinerney: Treating Rahsaan Weakland/Extender:Robson, Kandice Hams, Alexis Goodell in Treatment: 9 Compression Therapy  Performed for Wound Wound #1 Left,Posterior Lower Leg Assessment: Performed By: Clinician [name], RN Compression Type: Three Layer Post Procedure Diagnosis Same as Pre-procedure Electronic Signature(s) Signed: 04/06/2019 5:57:08 PM By: Kela Millin Entered By: Kela Millin on 04/06/2019 16:30:12 -------------------------------------------------------------------------------- Lower Extremity Assessment Details Patient Name: Date of Service: [name], [name] 04/06/2019 2:45 PM Medical Record [id number] Patient Account Number: [id number] Date of Birth/Sex: Treating RN: [birth year] (78 y.o. Jerilynn Mages) Carlene Coria Primary Care Lanisha Stepanian: Garret Reddish Other Clinician: Referring Tiasha Helvie: Treating Lorence Nagengast/Extender:Robson, Kandice Hams, Alexis Goodell in Treatment: 9 Edema Assessment Assessed: [Left: No] [Right: No] Edema: [Left: Ye] [Right: s] Calf Left: Right: Point of Measurement: 31 cm From Medial Instep 37 cm cm Ankle Left: Right: Point of Measurement: 12 cm From Medial Instep 25 cm cm Electronic Signature(s) Signed: 04/11/2019 5:42:11 PM By: Carlene Coria RN Entered By: Carlene Coria on 04/06/2019 15:51:39 -------------------------------------------------------------------------------- Multi Wound Chart Details Patient Name: Date of Service: [name], [name] 04/06/2019 2:45 PM Medical Record [id number] Patient Account Number: [id number] Date of Birth/Sex: Treating RN: 06-05-1940 (79 y.o. M) Primary Care Errol Ala: Garret Reddish Other Clinician: Referring Eryx Zane: Treating Jannice Beitzel/Extender:Robson, Kandice Hams, Alexis Goodell in Treatment: 9 Vital Signs Height(in): 74 Pulse(bpm): 63 Weight(lbs): 180 Blood Pressure(mmHg): 108/74 Body Mass Index(BMI): 23 Temperature(F): 98 Respiratory 18 Rate(breaths/min): Photos: [1:No Photos] [N/A:N/A] Wound Location: [1:Left Lower Leg - Posterior N/A] Wounding Event: [1:Trauma] [N/A:N/A] Primary Etiology:  [1:Venous Leg Ulcer] [N/A:N/A] Comorbid History: [1:Cataracts, Sleep Apnea, Congestive Heart Failure, Peripheral Venous Disease, Osteoarthritis, Neuropathy] [N/A:N/A] Date Acquired: [1:11/29/2018] [N/A:N/A] Weeks of Treatment: [1:9] [N/A:N/A] Wound Status: [1:Open] [N/A:N/A] Measurements L x W x D 0.9x0.6x0.1 [N/A:N/A] (cm) Area (cm) : [1:0.424] [N/A:N/A] Volume (cm) : [1:0.042] [N/A:N/A] % Reduction in Area: [1:96.60%] [N/A:N/A] % Reduction in Volume: 98.30% [N/A:N/A] Classification: [1:Full Thickness Without Exposed Support Structures] [N/A:N/A] Exudate Amount: [1:Medium] [N/A:N/A] Exudate Type: [1:Serosanguineous] [N/A:N/A] Exudate Color: [1:red, brown] [N/A:N/A] Wound Margin: [1:Flat and Intact] [N/A:N/A] Granulation Amount: [1:Large (67-100%)] [N/A:N/A] Granulation Quality: [1:Red] [N/A:N/A] Necrotic Amount: [1:Small (1-33%)] [N/A:N/A] Exposed Structures: [1:Fat Layer (Subcutaneous N/A Tissue) Exposed:  Yes Fascia: No Tendon: No Muscle: No Joint: No Bone: No] Epithelialization: [1:Medium (34-66%)] [N/A:N/A] Debridement: [1:Debridement - Excisional N/A] Pre-procedure [1:16:27] [N/A:N/A] Verification/Time Out Taken: Pain Control: [1:Other] [N/A:N/A] Tissue Debrided: [1:Callus, Subcutaneous] [N/A:N/A] Level: [1:Skin/Subcutaneous Tissue N/A] Debridement Area (sq cm):0.54 [N/A:N/A] Instrument: [1:Curette] [N/A:N/A] Bleeding: [1:Minimum] [N/A:N/A] Hemostasis Achieved: [1:Pressure] [N/A:N/A] Procedural Pain: [1:1] [N/A:N/A] Post Procedural Pain: [1:0] [N/A:N/A] Debridement Treatment Procedure was tolerated [N/A:N/A] Response: [1:well] Post Debridement [1:0.9x0.6x0.1] [N/A:N/A] Measurements L x W x D (cm) Post Debridement [1:0.042] [N/A:N/A] Volume: (cm) Procedures Performed: [1:Compression Therapy Debridement] [N/A:N/A] Treatment Notes Electronic Signature(s) Signed: 04/06/2019 6:20:19 PM By: Linton Ham MD Entered By: Linton Ham on 04/06/2019  17:57:33 -------------------------------------------------------------------------------- Multi-Disciplinary Care Plan Details Patient Name: Date of Service: [name], [name] 04/06/2019 2:45 PM Medical Record [id number] Patient Account Number: [id number] Date of Birth/Sex: Treating RN: November 15, 1940 (79 y.o. [name] Primary Care Hayly Litsey: Garret Reddish Other Clinician: Referring Karol Skarzynski: Treating Kanyon Bunn/Extender:Robson, Kandice Hams, Alexis Goodell in Treatment: 9 Active Inactive Venous Leg Ulcer Nursing Diagnoses: Knowledge deficit related to disease process and management Potential for venous Insuffiency (use before diagnosis confirmed) Goals: Patient will maintain optimal edema control Date Initiated: 01/31/2019 Target Resolution Date: 04/27/2019 Goal Status: Active Interventions: Assess peripheral edema status every visit. Compression as ordered Treatment Activities: Therapeutic compression applied : 01/31/2019 Notes: Wound/Skin Impairment Nursing Diagnoses: Impaired tissue integrity Knowledge deficit related to ulceration/compromised skin integrity Goals: Patient/caregiver will verbalize understanding of skin care regimen Date Initiated: 01/31/2019 Target Resolution Date: 04/27/2019 Goal Status: Active Ulcer/skin breakdown will have a volume reduction of 30% by week 4 Date Initiated: 01/31/2019 Date Inactivated: 03/07/2019 Target Resolution Date: 02/28/2019 Goal Status: Met Ulcer/skin breakdown will have a volume reduction of 50% by week 8 Date Initiated: 03/07/2019 Target Resolution Date: 04/27/2019 Goal Status: Active Interventions: Assess patient/caregiver ability to obtain necessary supplies Assess patient/caregiver ability to perform ulcer/skin care regimen upon admission and as needed Assess ulceration(s) every visit Treatment Activities: Skin care regimen initiated : 01/31/2019 Topical wound management initiated :  01/31/2019 Notes: Electronic Signature(s) Signed: 04/06/2019 5:57:08 PM By: Kela Millin Entered By: Kela Millin on 04/06/2019 16:10:42 -------------------------------------------------------------------------------- Pain Assessment Details Patient Name: Date of Service: [name], [name] 04/06/2019 2:45 PM Medical Record [id number] Patient Account Number: [id number] Date of Birth/Sex: Treating RN: 04/08/1940 (78 y.o. Jerilynn Mages) Carlene Coria Primary Care Miyana Mordecai: Garret Reddish Other Clinician: Referring Kioni Stahl: Treating Briyana Badman/Extender:Robson, Kandice Hams, Alexis Goodell in Treatment: 9 Active Problems Location of Pain Severity and Description of Pain Patient Has Paino No Site Locations Pain Management and Medication Current Pain Management: Electronic Signature(s) Signed: 04/11/2019 5:42:11 PM By: Carlene Coria RN Entered By: Carlene Coria on 04/06/2019 15:51:29 -------------------------------------------------------------------------------- Patient/Caregiver Education Details Patient Name: Date of Service: [name], [name] 1/15/2021andnbsp2:45 PM Medical Record [id number] Patient Account Number: [id number] Date of Birth/Gender: November 02, 1940 (78 y.o. M) Treating RN: Kela Millin Primary Care Physician: Garret Reddish Other Clinician: Referring Physician: Treating Physician/Extender:Robson, Kandice Hams, Alexis Goodell in Treatment: 9 Education Assessment Education Provided To: Patient Education Topics Provided Wound/Skin Impairment: Methods: Explain/Verbal Responses: State content correctly [employer]) Signed: 04/06/2019 5:57:08 PM By: Kela Millin Entered By: Kela Millin on 04/06/2019 16:10:57 -------------------------------------------------------------------------------- Wound Assessment Details Patient Name: Date of Service: [name], [name] 04/06/2019 2:45 PM Medical Record [id number] Patient Account Number:  [id number] Date of Birth/Sex: Treating RN: October 07, 1940 (78 y.o. Oval Linsey Primary Care L[place]leton Haub: Garret Reddish Other Clinician: Referring Gorman Safi: Treating Kanesha Cadle/Extender:Robson, Kandice Hams, Alexis Goodell in Treatment: 9 Wound Status Wound Number: 1 Primary Venous Leg Ulcer Etiology: Wound Location: Left Lower Leg - Posterior  Wound Open Wounding Event: Trauma Status: Date Acquired: 11/29/2018 Comorbid Cataracts, Sleep Apnea, Congestive Heart Weeks Of Treatment: 9 History: Failure, Peripheral Venous Disease, Clustered Wound: No Osteoarthritis, Neuropathy Photos Wound Measurements Length: (cm) 0.9 % Reduct Width: (cm) 0.6 % Reduct Depth: (cm) 0.1 Epitheli Area: (cm) 0.424 Tunneli Volume: (cm) 0.042 Undermi Wound Description Full Thickness Without Exposed Support Foul Odo Classification: Structures Slough/F Wound Flat and Intact Margin: Exudate Medium Amount: Exudate Serosanguineous Type: Exudate red, brown Color: Wound Bed Granulation Amount: Large (67-100%) Granulation Quality: Red Fascia E Necrotic Amount: Small (1-33%) Fat Laye Necrotic Quality: Adherent Slough Tendon E Muscle E Joint Ex Bone Exp r After Cleansing: No ibrino Yes Exposed Structure xposed: No r (Subcutaneous Tissue) Exposed: Yes xposed: No xposed: No posed: No osed: No ion in Area: 96.6% ion in Volume: 98.3% alization: Medium (34-66%) ng: No ning: No Electronic Signature(s) Signed: 04/10/2019 4:37:08 PM By: Mikeal Hawthorne EMT/HBOT Signed: 04/11/2019 5:42:11 PM By: Carlene Coria RN Entered By: Mikeal Hawthorne on 04/09/2019 16:29:34 -------------------------------------------------------------------------------- Vitals Details Patient Name: Date of Service: [name], [name] 04/06/2019 2:45 PM Medical Record [id number] Patient Account Number: [id number] Date of Birth/Sex: Treating RN: 1940-10-20 (78 y.o. Jerilynn Mages) Carlene Coria Primary Care Maximus Hoffert: Garret Reddish Other Clinician: Referring Jontavius Rabalais: Treating Verdon Ferrante/Extender:Robson, Kandice Hams, Alexis Goodell in Treatment: 9 Vital Signs Time Taken: 15:51 Temperature (F): 98 Height (in): 74 Pulse (bpm): 92 Weight (lbs): 180 Respiratory Rate (breaths/min): 18 Body Mass Index (BMI): 23.1 Blood Pressure (mmHg): 108/74 Reference Range: 80 - 120 mg / dl Electronic Signature(s) Signed: 04/11/2019 5:42:11 PM By: Carlene Coria RN Entered By: Carlene Coria on 04/06/2019 15:51:22

## 2019-04-12 ENCOUNTER — Telehealth: Payer: Self-pay | Admitting: Family Medicine

## 2019-04-12 DIAGNOSIS — I5033 Acute on chronic diastolic (congestive) heart failure: Secondary | ICD-10-CM | POA: Diagnosis not present

## 2019-04-12 DIAGNOSIS — D631 Anemia in chronic kidney disease: Secondary | ICD-10-CM | POA: Diagnosis not present

## 2019-04-12 DIAGNOSIS — I130 Hypertensive heart and chronic kidney disease with heart failure and stage 1 through stage 4 chronic kidney disease, or unspecified chronic kidney disease: Secondary | ICD-10-CM | POA: Diagnosis not present

## 2019-04-12 DIAGNOSIS — M1030 Gout due to renal impairment, unspecified site: Secondary | ICD-10-CM | POA: Diagnosis not present

## 2019-04-12 DIAGNOSIS — N184 Chronic kidney disease, stage 4 (severe): Secondary | ICD-10-CM | POA: Diagnosis not present

## 2019-04-12 NOTE — Telephone Encounter (Signed)
[facility] number: [id number]  Checking on status of home health forms. Last faxed on 04/12/19 Reason:  Frequency:

## 2019-04-13 ENCOUNTER — Encounter (HOSPITAL_BASED_OUTPATIENT_CLINIC_OR_DEPARTMENT_OTHER): Payer: Medicare Other | Admitting: Internal Medicine

## 2019-04-13 ENCOUNTER — Other Ambulatory Visit: Payer: Self-pay

## 2019-04-13 DIAGNOSIS — D631 Anemia in chronic kidney disease: Secondary | ICD-10-CM | POA: Diagnosis not present

## 2019-04-13 DIAGNOSIS — M1030 Gout due to renal impairment, unspecified site: Secondary | ICD-10-CM | POA: Diagnosis not present

## 2019-04-13 DIAGNOSIS — N184 Chronic kidney disease, stage 4 (severe): Secondary | ICD-10-CM | POA: Diagnosis not present

## 2019-04-13 DIAGNOSIS — G9009 Other idiopathic peripheral autonomic neuropathy: Secondary | ICD-10-CM | POA: Diagnosis not present

## 2019-04-13 DIAGNOSIS — I5033 Acute on chronic diastolic (congestive) heart failure: Secondary | ICD-10-CM | POA: Diagnosis not present

## 2019-04-13 DIAGNOSIS — I509 Heart failure, unspecified: Secondary | ICD-10-CM | POA: Diagnosis not present

## 2019-04-13 DIAGNOSIS — I872 Venous insufficiency (chronic) (peripheral): Secondary | ICD-10-CM | POA: Diagnosis not present

## 2019-04-13 DIAGNOSIS — M1990 Unspecified osteoarthritis, unspecified site: Secondary | ICD-10-CM | POA: Diagnosis not present

## 2019-04-13 DIAGNOSIS — G629 Polyneuropathy, unspecified: Secondary | ICD-10-CM | POA: Diagnosis not present

## 2019-04-13 DIAGNOSIS — I130 Hypertensive heart and chronic kidney disease with heart failure and stage 1 through stage 4 chronic kidney disease, or unspecified chronic kidney disease: Secondary | ICD-10-CM | POA: Diagnosis not present

## 2019-04-13 DIAGNOSIS — L97822 Non-pressure chronic ulcer of other part of left lower leg with fat layer exposed: Secondary | ICD-10-CM | POA: Diagnosis not present

## 2019-04-13 DIAGNOSIS — G4730 Sleep apnea, unspecified: Secondary | ICD-10-CM | POA: Diagnosis not present

## 2019-04-13 DIAGNOSIS — I890 Lymphedema, not elsewhere classified: Secondary | ICD-10-CM | POA: Diagnosis not present

## 2019-04-13 DIAGNOSIS — L97222 Non-pressure chronic ulcer of left calf with fat layer exposed: Secondary | ICD-10-CM | POA: Diagnosis not present

## 2019-04-13 NOTE — Telephone Encounter (Signed)
Called and spoke with [name] and she will refax this paperwork and I will get it faxed back to her as soon as I receive it.

## 2019-04-13 NOTE — Progress Notes (Signed)
Octavian, Godek Rylyn ([id number]) Visit Report for 04/13/2019 HPI Details Patient Name: Date of Service: [name], [name] 04/13/2019 3:00 PM Medical Record [id number] Patient Account Number: [id number] Date of Birth/Sex: Treating RN: November 22, 1940 (79 y.o. M) Primary Care Provider: Garret Reddish Other Clinician: Referring Provider: Treating Provider/Extender:Jesslyn Viglione, Kandice Hams, Alexis Goodell in Treatment: 10 History of Present Illness HPI Description: 01/31/2019 on evaluation today patient actually appears to be doing poorly unfortunately secondary to an injury that occurred when he hit his leg on a car door September 9. He was seen in the ER where he had 13 sutures placed. His primary care provider remove those 2 weeks later and unfortunately the wound began to dehisce following. It appears that he may have had a skin flap that just did not take and survive. Nonetheless he was stated to have had Keflex given from the ER though the patient states he does not even remember that. He also is stated to have taken 2 rounds of doxycycline by his primary care provider but again the patient states he only remembers the most recent antibiotic which was actually the Augmentin then switched to Bactrim based on the culture results. The culture showed that he had Morganella Morgagni noted in the culture. Subsequently when he was taking the Bactrim he states that the redness got much better but then unfortunately worsened again once he came off of the medication. Apparently the patient was given triamcinolone ointment to put on the wound although obviously that really did not do much for the wound and again the patient tells me that he was "given the wrong ointment". Again I am not exactly sure what was going on in that scenario 1 where another. Either way I do believe that he has necrotic tissue on the surface of the wound this can require some debridement if he is able to tolerate it and then  subsequently he is also get a need likely a refill of the Bactrim in order to help treat the infection I do not believe this is completely cleared up at this point. I also think that he really needs to have edema control again he is very swollen and I believe that Performing a compression wrap will also be of great benefit for him. The patient does have chronic kidney disease stage IV along with peripheral neuropathy secondary to issues with his back. 02/07/2019 on evaluation today patient appears to be doing a little better with regard to the overall appearance of the wound although it appears to be very dry. Fortunately there is no signs of active infection at this time. No fever chills noted. I really feel like that the patient may be having issues at this point with the Iodoflex being too absorptive for him at this point which is causing this to dry out. I think we may want to try something different to see if that would be of benefit for him. 02/14/2019 on evaluation today patient actually appears to be doing much better with regard to his wound at this time. He has been tolerating the dressing changes without complication. Fortunately everything appears to be doing much better from the standpoint of how soft the dead tissue is today in fact I was able to debride away some of the necrotic tissue because it was doing so well. Fortunately he is showing signs of good improvement as of this week. 02/21/2019 on evaluation today patient feels like he is actually doing a little bit worse noticing the redness on his lower extremity. I agree  I have not seen this before and I am concerned about the possibility of infection. I think we may want to initiate something such as doxycycline and possibly wound culture today to see what exactly may be going on here. 02/28/19 evaluation today patient appears to be doing better with regard to his wound. Hes been tolerating the dressing changes without  complication. Fortunately theres no signs of active infection at this time. Overall I feel like the doxycycline has also been helpful for him. 03/07/2019 on evaluation today patient appears to be doing very well with regard to his lower extremity ulcer which is showing excellent improvement. Fortunately there is no evidence of active infection which is good news. No fevers, chills, nausea, vomiting, or diarrhea. Overall I feel like he is making excellent progress towards closure. 12/28; since the patient was last here he fell and apparently fractured his shoulder in 2 places. He is seeing orthopedics. Kept his wrap on for 2 weeks. He has a venous insufficiency wound on the posterior left calf. We have been using silver collagen under 3 layer compression 03/30/2019. Patient stopped taking his torsemide because he was going to the bathroom frequently which was difficult because of his fractured shoulder. He was apparently admitted to hospital for congestive heart failure. He tells [place] that they changed his compression on the left leg twice. His wound is measuring smaller. We have been using silver collagen 1/15; in general his leg looks better today. Still a necrotic surface on the posterior left leg. He has severe chronic venous insufficiency. He did bring in his stockings they look like they are 20/30 below-knee. I have encouraged them to use this on the right leg which has severe wound threatening edema 1/22; patient's edema control is under better control. He has an open area on the left posterior calf that is superficial but seems to be resistant going towards closure. He had a wrap injury on the right anterior upper tibia area today. Electronic Signature(s) Signed: 04/13/2019 5:37:40 PM By: Linton Ham MD Entered By: Linton Ham on 04/13/2019 17:27:20 -------------------------------------------------------------------------------- Chemical Cauterization Details Patient Name: Date of  Service: [name], [name] 04/13/2019 3:00 PM Medical Record [id number] Patient Account Number: [id number] Date of Birth/Sex: Treating RN: 09-29-1940 (79 y.o. M) Primary Care Provider: Garret Reddish Other Clinician: Referring Provider: Treating Provider/Extender:Julian Askin, Kandice Hams, Alexis Goodell in Treatment: 10 Procedure Performed for: Wound #1 Left,Posterior Lower Leg Performed By: Physician Ricard Dillon., MD Post Procedure Diagnosis Same as Pre-procedure Electronic Signature(s) Signed: 04/13/2019 5:37:40 PM By: Linton Ham MD Entered By: Linton Ham on 04/13/2019 17:24:33 -------------------------------------------------------------------------------- Physical Exam Details Patient Name: Date of Service: [name], [name] 04/13/2019 3:00 PM Medical Record [id number] Patient Account Number: [id number] Date of Birth/Sex: Treating RN: [birth year] (79 y.o. M) Primary Care Provider: Garret Reddish Other Clinician: Referring Provider: Treating Provider/Extender:Lanett Lasorsa, Kandice Hams, Alexis Goodell in Treatment: 10 Constitutional Sitting or standing Blood Pressure is within target range for patient.. Pulse regular and within target range for patient.Marland Kitchen Respirations regular, non-labored and within target range.. Temperature is normal and within the target range for the patient.Marland Kitchen Appears in no distress. Notes Wound exam; left posterior calf. There is still some nonviable open tissue here. The hyper granulated and very vascular and weeping. I cauterized this with silver nitrate. He has a wrap injury on the upper left mid tibia area. Very superficial should be closed by next week Electronic Signature(s) Signed: 04/13/2019 5:37:40 PM By: Linton Ham MD Entered By: Linton Ham on 04/13/2019 17:28:17 -------------------------------------------------------------------------------- Physician Orders  Details Patient Name: Date of Service: [name], [name]  04/13/2019 3:00 PM Medical Record [id number] Patient Account Number: [id number] Date of Birth/Sex: Treating RN: 09/17/1940 (79 y.o. [name] Primary Care Provider: Garret Reddish Other Clinician: Referring Provider: Treating Provider/Extender:Nixon Kolton, Kandice Hams, Alexis Goodell in Treatment: 10 Verbal / Phone Orders: No Diagnosis Coding ICD-10 Coding Code Description I87.2 Venous insufficiency (chronic) (peripheral) S81.802A Unspecified open wound, left lower leg, initial encounter L97.822 Non-pressure chronic ulcer of other part of left lower leg with fat layer exposed N18.4 Chronic kidney disease, stage 4 (severe) G90.09 Other idiopathic peripheral autonomic neuropathy Follow-up Appointments Return Appointment in 1 week. Dressing Change Frequency Wound #1 Left,Posterior Lower Leg Do not change entire dressing for one week. Skin Barriers/Peri-Wound Care Wound #1 Left,Posterior Lower Leg Moisturizing lotion - to leg Wound Cleansing Wound #1 Left,Posterior Lower Leg May shower with protection. Primary Wound Dressing Wound #1 Left,Posterior Lower Leg Hydrofera Blue - classic Wound #2 Left,Anterior Lower Leg Hydrofera Blue - classic Secondary Dressing Wound #1 Left,Posterior Lower Leg Dry Gauze ABD pad Wound #2 Left,Anterior Lower Leg Dry Gauze ABD pad Edema Control 3 Layer Compression System - Left Lower Extremity Avoid standing for long periods of time Elevate legs to the level of the heart or above for 30 minutes daily and/or when sitting, a frequency of: - throughout the week Exercise regularly Support Garment 20-30 mm/Hg pressure to: - wear stocking to right lower leg. apply in the morning and remove at night before bed Electronic Signature(s) Signed: 04/13/2019 5:37:40 PM By: Linton Ham MD Signed: 04/13/2019 5:39:40 PM By: Kela Millin Entered By: Kela Millin on 04/13/2019  16:39:30 -------------------------------------------------------------------------------- Problem List Details Patient Name: Date of Service: [name], [name] 04/13/2019 3:00 PM Medical Record [id number] Patient Account Number: [id number] Date of Birth/Sex: Treating RN: [birth year] (79 y.o. [name] Primary Care Provider: Garret Reddish Other Clinician: Referring Provider: Treating Provider/Extender:Queenie Aufiero, Kandice Hams, Alexis Goodell in Treatment: 10 Active Problems ICD-10 Evaluated Encounter Code Description Active Date Today Diagnosis I87.2 Venous insufficiency (chronic) (peripheral) 01/31/2019 No Yes S81.802A Unspecified open wound, left lower leg, initial 01/31/2019 No Yes encounter L97.822 Non-pressure chronic ulcer of other part of left lower 01/31/2019 No Yes leg with fat layer exposed N18.4 Chronic kidney disease, stage 4 (severe) 01/31/2019 No Yes G90.09 Other idiopathic peripheral autonomic neuropathy 01/31/2019 No Yes Inactive Problems Resolved Problems Electronic Signature(s) Signed: 04/13/2019 5:37:40 PM By: Linton Ham MD Entered By: Linton Ham on 04/13/2019 17:24:12 -------------------------------------------------------------------------------- Progress Note Details Patient Name: Date of Service: [name], [name] 04/13/2019 3:00 PM Medical Record [id number] Patient Account Number: [id number] Date of Birth/Sex: Treating RN: September 11, 1940 (79 y.o. M) Primary Care Provider: Garret Reddish Other Clinician: Referring Provider: Treating Provider/Extender:Excell Neyland, Kandice Hams, Alexis Goodell in Treatment: 10 Subjective History of Present Illness (HPI) 01/31/2019 on evaluation today patient actually appears to be doing poorly unfortunately secondary to an injury that occurred when he hit his leg on a car door September 9. He was seen in the ER where he had 13 sutures placed. His primary care provider remove those 2 weeks later and  unfortunately the wound began to dehisce following. It appears that he may have had a skin flap that just did not take and survive. Nonetheless he was stated to have had Keflex given from the ER though the patient states he does not even remember that. He also is stated to have taken 2 rounds of doxycycline by his primary care provider but again the patient states he only remembers the most recent antibiotic which  was actually the Augmentin then switched to Bactrim based on the culture results. The culture showed that he had Morganella Morgagni noted in the culture. Subsequently when he was taking the Bactrim he states that the redness got much better but then unfortunately worsened again once he came off of the medication. Apparently the patient was given triamcinolone ointment to put on the wound although obviously that really did not do much for the wound and again the patient tells me that he was "given the wrong ointment". Again I am not exactly sure what was going on in that scenario 1 where another. Either way I do believe that he has necrotic tissue on the surface of the wound this can require some debridement if he is able to tolerate it and then subsequently he is also get a need likely a refill of the Bactrim in order to help treat the infection I do not believe this is completely cleared up at this point. I also think that he really needs to have edema control again he is very swollen and I believe that Performing a compression wrap will also be of great benefit for him. The patient does have chronic kidney disease stage IV along with peripheral neuropathy secondary to issues with his back. 02/07/2019 on evaluation today patient appears to be doing a little better with regard to the overall appearance of the wound although it appears to be very dry. Fortunately there is no signs of active infection at this time. No fever chills noted. I really feel like that the patient may be having  issues at this point with the Iodoflex being too absorptive for him at this point which is causing this to dry out. I think we may want to try something different to see if that would be of benefit for him. 02/14/2019 on evaluation today patient actually appears to be doing much better with regard to his wound at this time. He has been tolerating the dressing changes without complication. Fortunately everything appears to be doing much better from the standpoint of how soft the dead tissue is today in fact I was able to debride away some of the necrotic tissue because it was doing so well. Fortunately he is showing signs of good improvement as of this week. 02/21/2019 on evaluation today patient feels like he is actually doing a little bit worse noticing the redness on his lower extremity. I agree I have not seen this before and I am concerned about the possibility of infection. I think we may want to initiate something such as doxycycline and possibly wound culture today to see what exactly may be going on here. 02/28/19 evaluation today patient appears to be doing better with regard to his wound. Heoos been tolerating the dressing changes without complication. Fortunately thereoos no signs of active infection at this time. Overall I feel like the doxycycline has also been helpful for him. 03/07/2019 on evaluation today patient appears to be doing very well with regard to his lower extremity ulcer which is showing excellent improvement. Fortunately there is no evidence of active infection which is good news. No fevers, chills, nausea, vomiting, or diarrhea. Overall I feel like he is making excellent progress towards closure. 12/28; since the patient was last here he fell and apparently fractured his shoulder in 2 places. He is seeing orthopedics. Kept his wrap on for 2 weeks. He has a venous insufficiency wound on the posterior left calf. We have been using silver collagen under 3 layer  compression 03/30/2019. Patient stopped taking his torsemide because he was going to the bathroom frequently which was difficult because of his fractured shoulder. He was apparently admitted to hospital for congestive heart failure. He tells [place] that they changed his compression on the left leg twice. His wound is measuring smaller. We have been using silver collagen 1/15; in general his leg looks better today. Still a necrotic surface on the posterior left leg. He has severe chronic venous insufficiency. He did bring in his stockings they look like they are 20/30 below-knee. I have encouraged them to use this on the right leg which has severe wound threatening edema 1/22; patient's edema control is under better control. He has an open area on the left posterior calf that is superficial but seems to be resistant going towards closure. He had a wrap injury on the right anterior upper tibia area today. Objective Constitutional Sitting or standing Blood Pressure is within target range for patient.. Pulse regular and within target range for patient.Marland Kitchen Respirations regular, non-labored and within target range.. Temperature is normal and within the target range for the patient.Marland Kitchen Appears in no distress. Vitals Time Taken: 4:09 PM, Height: 74 in, Source: Stated, Weight: 180 lbs, Source: Stated, BMI: 23.1, Temperature: 97.8 F, Pulse: 96 bpm, Respiratory Rate: 18 breaths/min, Blood Pressure: 114/57 mmHg. General Notes: Wound exam; left posterior calf. There is still some nonviable open tissue here. The hyper granulated and very vascular and weeping. I cauterized this with silver nitrate. ooHe has a wrap injury on the upper left mid tibia area. Very superficial should be closed by next week Integumentary (Hair, Skin) Wound #1 status is Open. Original cause of wound was Trauma. The wound is located on the Left,Posterior Lower Leg. The wound measures 1.5cm length x 1.4cm width x 0.1cm depth; 1.649cm^2 area  and 0.165cm^3 volume. There is Fat Layer (Subcutaneous Tissue) Exposed exposed. There is no tunneling or undermining noted. There is a small amount of serosanguineous drainage noted. The wound margin is flat and intact. There is large (67-100%) red granulation within the wound bed. There is no necrotic tissue within the wound bed. Wound #2 status is Open. Original cause of wound was Blister. The wound is located on the Left,Anterior Lower Leg. The wound measures 1cm length x 1cm width x 0.1cm depth; 0.785cm^2 area and 0.079cm^3 volume. There is Fat Layer (Subcutaneous Tissue) Exposed exposed. There is no tunneling or undermining noted. There is a small amount of serosanguineous drainage noted. The wound margin is distinct with the outline attached to the wound base. There is medium (34-66%) red, pink granulation within the wound bed. There is no necrotic tissue within the wound bed. Assessment Active Problems ICD-10 Venous insufficiency (chronic) (peripheral) Unspecified open wound, left lower leg, initial encounter Non-pressure chronic ulcer of other part of left lower leg with fat layer exposed Chronic kidney disease, stage 4 (severe) Other idiopathic peripheral autonomic neuropathy Procedures Wound #1 Pre-procedure diagnosis of Wound #1 is a Venous Leg Ulcer located on the Left,Posterior Lower Leg . There was a Three Layer Compression Therapy Procedure by [name], RN. Post procedure Diagnosis Wound #1: Same as Pre-Procedure Pre-procedure diagnosis of Wound #1 is a Venous Leg Ulcer located on the Left,Posterior Lower Leg . An Chemical Cauterization procedure was performed by Ricard Dillon., MD. Post procedure Diagnosis Wound #1: Same as Pre-Procedure Wound #2 Pre-procedure diagnosis of Wound #2 is a Trauma, Other located on the Left,Anterior Lower Leg . There was a Three Layer Compression Therapy  Procedure by [name], RN. Post procedure Diagnosis Wound #2: Same as  Pre-Procedure Plan Follow-up Appointments: Return Appointment in 1 week. Dressing Change Frequency: Wound #1 Left,Posterior Lower Leg: Do not change entire dressing for one week. Skin Barriers/Peri-Wound Care: Wound #1 Left,Posterior Lower Leg: Moisturizing lotion - to leg Wound Cleansing: Wound #1 Left,Posterior Lower Leg: May shower with protection. Primary Wound Dressing: Wound #1 Left,Posterior Lower Leg: Hydrofera Blue - classic Wound #2 Left,Anterior Lower Leg: Hydrofera Blue - classic Secondary Dressing: Wound #1 Left,Posterior Lower Leg: Dry Gauze ABD pad Wound #2 Left,Anterior Lower Leg: Dry Gauze ABD pad Edema Control: 3 Layer Compression System - Left Lower Extremity Avoid standing for long periods of time Elevate legs to the level of the heart or above for 30 minutes daily and/or when sitting, a frequency of: - throughout the week Exercise regularly Support Garment 20-30 mm/Hg pressure to: - wear stocking to right lower leg. apply in the morning and remove at night before bed 1. Change the primary dressing to [facility] classic/ABDs/still under 3 layer compression 2. We padded the area on the left upper leg Electronic Signature(s) Signed: 04/13/2019 5:37:40 PM By: Linton Ham MD Entered By: Linton Ham on 04/13/2019 17:29:03 -------------------------------------------------------------------------------- SuperBill Details Patient Name: Date of Service: [name], [name] 04/13/2019 Medical Record [id number] Patient Account Number: [id number] Date of Birth/Sex: Treating RN: 07/08/1940 (79 y.o. [name] Primary Care Provider: Garret Reddish Other Clinician: Referring Provider: Treating Provider/Extender:Reshunda Strider, Kandice Hams, Alexis Goodell in Treatment: 10 Diagnosis Coding ICD-10 Codes Code Description I87.2 Venous insufficiency (chronic) (peripheral) S81.802A Unspecified open wound, left lower leg, initial encounter L97.822  Non-pressure chronic ulcer of other part of left lower leg with fat layer exposed N18.4 Chronic kidney disease, stage 4 (severe) G90.09 Other idiopathic peripheral autonomic neuropathy Facility Procedures CPT4 Code Description: [id number] [place] - CHEM CAUT GRANULATION TISS ICD-10 Diagnosis Description L97.822 Non-pressure chronic ulcer of other part of left lower le Modifier: g with fat laye Quantity: 1 r exposed Physician Procedures CPT4 Code Description: [id number] - WC PHYS CHEM CAUT GRAN TISSUE ICD-10 Diagnosis Description L97.822 Non-pressure chronic ulcer of other part of left lower leg Modifier: with fat layer Quantity: 1 exposed Electronic Signature(s) Signed: 04/13/2019 5:37:40 PM By: Linton Ham MD Entered By: Linton Ham on 04/13/2019 17:29:20

## 2019-04-16 DIAGNOSIS — I5033 Acute on chronic diastolic (congestive) heart failure: Secondary | ICD-10-CM | POA: Diagnosis not present

## 2019-04-16 DIAGNOSIS — D631 Anemia in chronic kidney disease: Secondary | ICD-10-CM | POA: Diagnosis not present

## 2019-04-16 DIAGNOSIS — I130 Hypertensive heart and chronic kidney disease with heart failure and stage 1 through stage 4 chronic kidney disease, or unspecified chronic kidney disease: Secondary | ICD-10-CM | POA: Diagnosis not present

## 2019-04-16 DIAGNOSIS — M1030 Gout due to renal impairment, unspecified site: Secondary | ICD-10-CM | POA: Diagnosis not present

## 2019-04-16 DIAGNOSIS — N184 Chronic kidney disease, stage 4 (severe): Secondary | ICD-10-CM | POA: Diagnosis not present

## 2019-04-16 NOTE — Progress Notes (Addendum)
Shelby, Anderle Marland ([id number]) Visit Report for 04/13/2019 Arrival Information Details Patient Name: Date of Service: MANDRELL, VANGILDER 04/13/2019 3:00 PM Medical Record [id number] Patient Account Number: [id number] Date of Birth/Sex: Treating RN: 1940/04/18 (79 y.o. [name] Primary Care Hydia Copelin: Garret Reddish Other Clinician: Referring Krish Bailly: Treating Kinston Magnan/Extender:Robson, Kandice Hams, Alexis Goodell in Treatment: 10 Visit Information History Since Last Visit Added or deleted any medications: No Patient Arrived: Ambulatory Any new allergies or adverse reactions: No Arrival Time: 16:07 Had a fall or experienced change in No Accompanied By: self activities of daily living that may affect Transfer Assistance: None risk of falls: Patient Identification Verified: Yes Signs or symptoms of abuse/neglect since last No Secondary Verification Process Completed: Yes visito Patient Requires Transmission-Based No Hospitalized since last visit: No Precautions: Implantable device outside of the clinic excluding No Patient Has Alerts: No cellular tissue based products placed in the center since last visit: Has Dressing in Place as Prescribed: Yes Has Compression in Place as Prescribed: Yes Pain Present Now: Yes Electronic Signature(s) Signed: 04/13/2019 4:58:06 PM By: Baruch Gouty RN, BSN Entered By: Baruch Gouty on 04/13/2019 16:08:54 -------------------------------------------------------------------------------- Compression Therapy Details Patient Name: Date of Service: Michal, [place] 04/13/2019 3:00 PM Medical Record [id number] Patient Account Number: [id number] Date of Birth/Sex: Treating RN: 1941/03/18 (78 y.o. [name] Primary Care Fines Kimberlin: Garret Reddish Other Clinician: Referring Amali Uhls: Treating Simran Mannis/Extender:Robson, Kandice Hams, Alexis Goodell in Treatment: 10 Compression Therapy Performed for Wound Wound #1  Left,Posterior Lower Leg Assessment: Performed By: Clinician [name], RN Compression Type: Three Layer Post Procedure Diagnosis Same as Pre-procedure Electronic Signature(s) Signed: 04/13/2019 5:39:40 PM By: Kela Millin Entered By: Kela Millin on 04/13/2019 16:48:24 -------------------------------------------------------------------------------- Compression Therapy Details Patient Name: Date of Service: Keath, Matera Ryelan 04/13/2019 3:00 PM Medical Record [id number] Patient Account Number: [id number] Date of Birth/Sex: Treating RN: [birth year] (79 y.o. [name] Primary Care Mirca Yale: Garret Reddish Other Clinician: Referring Gatlyn Lipari: Treating Kolbie Clarkston/Extender:Robson, Kandice Hams, Alexis Goodell in Treatment: 10 Compression Therapy Performed for Wound Wound #2 Left,Anterior Lower Leg Assessment: Performed By: Clinician [name], RN Compression Type: Three Layer Post Procedure Diagnosis Same as Pre-procedure Electronic Signature(s) Signed: 04/13/2019 5:39:40 PM By: Kela Millin Entered By: Kela Millin on 04/13/2019 16:48:24 -------------------------------------------------------------------------------- Encounter Discharge Information Details Patient Name: Date of Service: Jago, Carton Alessio 04/13/2019 3:00 PM Medical Record [id number] Patient Account Number: [id number] Date of Birth/Sex: Treating RN: 1940/09/26 (80 y.o. [name] Primary Care Uthman Mroczkowski: Garret Reddish Other Clinician: Referring Twanisha Foulk: Treating Trinidee Schrag/Extender:Robson, Kandice Hams, Alexis Goodell in Treatment: 10 Encounter Discharge Information Items Discharge Condition: Stable Ambulatory Status: Ambulatory Discharge Destination: Home Transportation: Private Auto Accompanied By: self Schedule Follow-up Appointment: Yes Clinical Summary of Care: Electronic Signature(s) Signed: 04/13/2019 5:44:09 PM By: [name] Entered By: [name] on 04/13/2019 17:40:37 -------------------------------------------------------------------------------- Lower Extremity Assessment Details Patient Name: Date of Service: BURAK, ZERBE 04/13/2019 3:00 PM Medical Record [id number] Patient Account Number: [id number] Date of Birth/Sex: Treating RN: 12/18/1940 (78 y.o. [name] Primary Care London Tarnowski: Garret Reddish Other Clinician: Referring Parks Czajkowski: Treating Stacie Templin/Extender:Robson, Kandice Hams, Alexis Goodell in Treatment: 10 Edema Assessment Assessed: [Left: No] [Right: No] Edema: [Left: Ye] [Right: s] Calf Left: Right: Point of Measurement: 31 cm From Medial Instep 37.5 cm cm Ankle Left: Right: Point of Measurement: 12 cm From Medial Instep 25 cm cm Vascular Assessment Pulses: Dorsalis Pedis Palpable: [Left:Yes] Electronic Signature(s) Signed: 04/13/2019 4:58:06 PM By: Baruch Gouty RN, BSN Entered By: Baruch Gouty on 04/13/2019 16:10:52 -------------------------------------------------------------------------------- Multi Wound Chart Details Patient Name: Date of Service:  MCCANNON, Xzander 04/13/2019 3:00 PM Medical Record [id number] Patient Account Number: [id number] Date of Birth/Sex: Treating RN: [birth year] (79 y.o. M) Primary Care Andriea Hasegawa: Garret Reddish Other Clinician: Referring Wendelin Reader: Treating Loraine Bhullar/Extender:Robson, Kandice Hams, Alexis Goodell in Treatment: 10 Vital Signs Height(in): 74 Pulse(bpm): 96 Weight(lbs): 180 Blood Pressure(mmHg): 114/57 Body Mass Index(BMI): 23 Temperature(F): 97.8 Respiratory 18 Rate(breaths/min): Photos: [1:No Photos] [2:No Photos] [N/A:N/A] Wound Location: [1:Left Lower Leg - Posterior Left Lower Leg - Anterior N/A] Wounding Event: [1:Trauma] [2:Blister] [N/A:N/A] Primary Etiology: [1:Venous Leg Ulcer] [2:Trauma, Other] [N/A:N/A] Comorbid History: [1:Cataracts, Sleep Apnea, Congestive Heart Failure, Congestive Heart Failure,  Peripheral Venous Disease, Peripheral Venous Disease, Osteoarthritis, Neuropathy Osteoarthritis, Neuropathy] [2:Cataracts, Sleep Apnea,] [N/A:N/A] Date Acquired: [1:11/29/2018] [2:04/13/2019] [N/A:N/A] Weeks of Treatment: [1:10] [2:0] [N/A:N/A] Wound Status: [1:Open] [2:Open] [N/A:N/A] Measurements L x W x D 1.5x1.4x0.1 [2:1x1x0.1] [N/A:N/A] (cm) Area (cm) : [1:1.649] [2:0.785] [N/A:N/A] Volume (cm) : [1:0.165] [2:0.079] [N/A:N/A] % Reduction in Area: [1:86.60%] [2:N/A] [N/A:N/A] % Reduction in Volume: 93.30% [2:N/A] [N/A:N/A] Classification: [1:Full Thickness Without Exposed Support Structures Exposed Support Structures] [2:Full Thickness Without] [N/A:N/A] Exudate Amount: [1:Small] [2:Small] [N/A:N/A] Exudate Type: [1:Serosanguineous] [2:Serosanguineous] [N/A:N/A] Exudate Color: [1:red, brown] [2:red, brown] [N/A:N/A] Wound Margin: [1:Flat and Intact] [2:Distinct, outline attached N/A] Granulation Amount: [1:Large (67-100%)] [2:Medium (34-66%)] [N/A:N/A] Granulation Quality: [1:Red] [2:Red, Pink] [N/A:N/A] Necrotic Amount: [1:None Present (0%)] [2:None Present (0%)] [N/A:N/A] Exposed Structures: [1:Fat Layer (Subcutaneous Fat Layer (Subcutaneous N/A Tissue) Exposed: Yes Fascia: No Tendon: No Muscle: No Joint: No Bone: No] [2:Tissue) Exposed: Yes Fascia: No Tendon: No Muscle: No Joint: No Bone: No] Epithelialization: [1:Medium (34-66%)] [2:None] [N/A:N/A] Procedures Performed: Chemical Cauterization [1:Compression Therapy] [2:Compression Therapy] [N/A:N/A] Treatment Notes Electronic Signature(s) Signed: 04/13/2019 5:37:40 PM By: Linton Ham MD Entered By: Linton Ham on 04/13/2019 17:24:19 -------------------------------------------------------------------------------- Multi-Disciplinary Care Plan Details Patient Name: Date of Service: Jaze, Rodino [place] 04/13/2019 3:00 PM Medical Record [id number] Patient Account Number: [id number] Date of Birth/Sex: Treating RN: [birth year]  (79 y.o. [name] Primary Care Elyjah Hazan: Garret Reddish Other Clinician: Referring Danni Leabo: Treating Marita Burnsed/Extender:Robson, Kandice Hams, Alexis Goodell in Treatment: 10 Active Inactive Venous Leg Ulcer Nursing Diagnoses: Knowledge deficit related to disease process and management Potential for venous Insuffiency (use before diagnosis confirmed) Goals: Patient will maintain optimal edema control Date Initiated: 01/31/2019 Target Resolution Date: 04/27/2019 Goal Status: Active Interventions: Assess peripheral edema status every visit. Compression as ordered Treatment Activities: Therapeutic compression applied : 01/31/2019 Notes: Wound/Skin Impairment Nursing Diagnoses: Impaired tissue integrity Knowledge deficit related to ulceration/compromised skin integrity Goals: Patient/caregiver will verbalize understanding of skin care regimen Date Initiated: 01/31/2019 Target Resolution Date: 04/27/2019 Goal Status: Active Ulcer/skin breakdown will have a volume reduction of 30% by week 4 Date Initiated: 01/31/2019 Date Inactivated: 03/07/2019 Target Resolution Date: 02/28/2019 Goal Status: Met Ulcer/skin breakdown will have a volume reduction of 50% by week 8 Date Initiated: 03/07/2019 Target Resolution Date: 04/27/2019 Goal Status: Active Interventions: Assess patient/caregiver ability to obtain necessary supplies Assess patient/caregiver ability to perform ulcer/skin care regimen upon admission and as needed Assess ulceration(s) every visit Treatment Activities: Skin care regimen initiated : 01/31/2019 Topical wound management initiated : 01/31/2019 Notes: Electronic Signature(s) Signed: 04/13/2019 5:39:40 PM By: Kela Millin Entered By: Kela Millin on 04/13/2019 16:26:31 -------------------------------------------------------------------------------- Pain Assessment Details Patient Name: Date of Service: Antino, Mayabb Arsenio 04/13/2019 3:00 PM Medical  Record [id number] Patient Account Number: [id number] Date of Birth/Sex: Treating RN: 04/03/1940 (79 y.o. [name] Primary Care Alphonsa Brickle: Garret Reddish Other Clinician: Referring Latika Kronick: Treating Lelan Cush/Extender:Robson, Kandice Hams, Alexis Goodell in Treatment: 10 Active Problems Location of  Pain Severity and Description of Pain Patient Has Paino Yes Site Locations Pain Location: Generalized Pain Duration of the Pain. Constant / Intermittento Constant Rate the pain. Current Pain Level: 8 Character of Pain Describe the Pain: Aching Pain Management and Medication Current Pain Management: Medication: Yes How does your wound impact your activities of daily livingo Sleep: Yes Bathing: No Appetite: No Relationship With Others: No Bladder Continence: No Emotions: No Bowel Continence: No Work: No Toileting: No Drive: No Dressing: No Hobbies: No Notes c/o left arm pain Electronic Signature(s) Signed: 04/13/2019 4:58:06 PM By: Baruch Gouty RN, BSN Entered By: Baruch Gouty on 04/13/2019 16:10:42 -------------------------------------------------------------------------------- Patient/Caregiver Education Details Patient Name: Date of Service: [name] [place] 1/22/2021andnbsp3:00 PM Medical Record [id number] Patient Account Number: [id number] Date of Birth/Gender: 1940/04/26 (79 y.o. M) Treating RN: Kela Millin Primary Care Physician: Garret Reddish Other Clinician: Referring Physician: Treating Physician/Extender:Robson, Kandice Hams, Alexis Goodell in Treatment: 10 Education Assessment Education Provided To: Patient Education Topics Provided Wound/Skin Impairment: Methods: Explain/Verbal Responses: State content correctly Electronic Signature(s) Signed: 04/13/2019 5:39:40 PM By: Kela Millin Entered By: Kela Millin on 04/13/2019  16:26:48 -------------------------------------------------------------------------------- Wound Assessment Details Patient Name: Date of Service: Brinton, Brandel Flynn 04/13/2019 3:00 PM Medical Record [id number] Patient Account Number: [id number] Date of Birth/Sex: Treating RN: 1941/02/10 (78 y.o. M) Primary Care Misao Fackrell: Garret Reddish Other Clinician: Referring Brynnleigh Mcelwee: Treating Felicidad Sugarman/Extender:Robson, Kandice Hams, Alexis Goodell in Treatment: 10 Wound Status Wound Number: 1 Primary Venous Leg Ulcer Etiology: Wound Location: Left, Posterior Lower Leg Wound Open Wounding Event: Trauma Status: Status: Date Acquired: 11/29/2018 Comorbid Cataracts, Sleep Apnea, Congestive Heart Weeks Of Treatment: 10 History: Failure, Peripheral Venous Disease, Clustered Wound: No Osteoarthritis, Neuropathy Photos Wound Measurements Length: (cm) 1.5 % Reduct Width: (cm) 1.4 % Reduct Depth: (cm) 0.1 Epitheli Area: (cm) 1.649 Tunneli Volume: (cm) 0.165 Undermi Wound Description Full Thickness Without Exposed Support Foul Odo Classification: Structures Slough/F Wound Flat and Intact Margin: Exudate Small Amount: Exudate Serosanguineous Type: Exudate red, brown Color: Wound Bed Granulation Amount: Large (67-100%) Granulation Quality: Red Fascia E Necrotic Amount: None Present (0%) Fat Laye Tendon E Muscle E Joint Ex Bone Exp r After Cleansing: No ibrino Yes Exposed Structure xposed: No r (Subcutaneous Tissue) Exposed: Yes xposed: No xposed: No posed: No osed: No ion in Area: 86.6% ion in Volume: 93.3% alization: Medium (34-66%) ng: No ning: No Treatment Notes Wound #1 (Left, Posterior Lower Leg) 1. Cleanse With Wound Cleanser Soap and water 2. Periwound Care Moisturizing lotion 3. Primary Dressing Applied Hydrofera Blue 4. Secondary Dressing Dry Gauze 6. Support Layer Applied 3 layer compression wrap Notes netting. Electronic Signature(s) Signed:  04/16/2019 4:40:42 PM By: Mikeal Hawthorne EMT/HBOT Previous Signature: 04/13/2019 4:58:06 PM Version By: Baruch Gouty RN, BSN Entered By: Mikeal Hawthorne on 04/16/2019 15:03:06 -------------------------------------------------------------------------------- Wound Assessment Details Patient Name: Date of Service: Lavonta, Tillis Talor 04/13/2019 3:00 PM Medical Record [id number] Patient Account Number: [id number] Date of Birth/Sex: Treating RN: [birth year] (78 y.o. M) Primary Care Jaelan Rasheed: Garret Reddish Other Clinician: Referring Skyla Champagne: Treating Daniah Zaldivar/Extender:Robson, Kandice Hams, Alexis Goodell in Treatment: 10 Wound Status Wound Number: 2 Primary Trauma, Other Etiology: Wound Location: Left Lower Leg - Anterior Wound Open Wounding Event: Blister Status: Date Acquired: 04/13/2019 Comorbid Cataracts, Sleep Apnea, Congestive Heart Weeks Of Treatment: 0 History: Failure, Peripheral Venous Disease, Clustered Wound: No Osteoarthritis, Neuropathy Photos Wound Measurements Length: (cm) 1 % Reduct Width: (cm) 1 % Reduct Depth: (cm) 0.1 Epitheli Area: (cm) 0.785 Tunneli Volume: (cm) 0.079 Undermi Wound Description Classification: Full Thickness Without Exposed Support Foul Odo  Structures Slough/F Wound Distinct, outline attached Margin: Exudate Small Amount: Exudate Serosanguineous Type: Exudate red, brown Color: Wound Bed Granulation Amount: Medium (34-66%) Granulation Quality: Red, Pink Fascia Exp Necrotic Amount: None Present (0%) Fat Layer Tendon Exp Muscle Exp Joint Expo Bone Expos r After Cleansing: No ibrino No Exposed Structure osed: No (Subcutaneous Tissue) Exposed: Yes osed: No osed: No sed: No ed: No ion in Area: 0% ion in Volume: 0% alization: None ng: No ning: No Treatment Notes Wound #2 (Left, Anterior Lower Leg) 1. Cleanse With Wound Cleanser Soap and water 2. Periwound Care Moisturizing lotion 3. Primary Dressing  Applied Hydrofera Blue 4. Secondary Dressing Dry Gauze 6. Support Layer Applied 3 layer compression wrap Notes netting. Electronic Signature(s) Signed: 04/16/2019 4:40:42 PM By: Mikeal Hawthorne EMT/HBOT Previous Signature: 04/13/2019 5:39:40 PM Version By: Kela Millin Entered By: Mikeal Hawthorne on 04/16/2019 15:04:08 -------------------------------------------------------------------------------- Vitals Details Patient Name: Date of Service: [name], Addam 04/13/2019 3:00 PM Medical Record [id number] Patient Account Number: [id number] Date of Birth/Sex: Treating RN: 20-Apr-1940 (79 y.o. [name] Primary Care Shigeko Manard: Garret Reddish Other Clinician: Referring Ahmed Inniss: Treating Cari Burgo/Extender:Robson, Kandice Hams, Alexis Goodell in Treatment: 10 Vital Signs Time Taken: 16:09 Temperature (F): 97.8 Height (in): 74 Pulse (bpm): 96 Source: Stated Respiratory Rate (breaths/min): 18 Weight (lbs): 180 Blood Pressure (mmHg): 114/57 Source: Stated Reference Range: 80 - 120 mg / dl Body Mass Index (BMI): 23.1 Electronic Signature(s) Signed: 04/13/2019 4:58:06 PM By: Baruch Gouty RN, BSN Entered By: Baruch Gouty on 04/13/2019 16:09:26

## 2019-04-17 ENCOUNTER — Telehealth: Payer: Self-pay | Admitting: Family Medicine

## 2019-04-17 DIAGNOSIS — Z961 Presence of intraocular lens: Secondary | ICD-10-CM | POA: Diagnosis not present

## 2019-04-17 NOTE — Telephone Encounter (Signed)
Patient calls In this afternoon saying that he spoke to someone in the past about getting a shot for Osteoporosis, and also asked if a nurse could call him to speak about his cardiology doctor.

## 2019-04-17 NOTE — Telephone Encounter (Signed)
Called endo has been reviewed by doc they will be calling for appointment soon.   Do you want [place] to put in referral for cardiology

## 2019-04-17 NOTE — Telephone Encounter (Signed)
I believe he already sees Dr. Alfredo Martinez sure what his question is-please call to clarify

## 2019-04-18 DIAGNOSIS — N2581 Secondary hyperparathyroidism of renal origin: Secondary | ICD-10-CM | POA: Diagnosis not present

## 2019-04-18 DIAGNOSIS — D631 Anemia in chronic kidney disease: Secondary | ICD-10-CM | POA: Diagnosis not present

## 2019-04-18 DIAGNOSIS — I129 Hypertensive chronic kidney disease with stage 1 through stage 4 chronic kidney disease, or unspecified chronic kidney disease: Secondary | ICD-10-CM | POA: Diagnosis not present

## 2019-04-18 DIAGNOSIS — N184 Chronic kidney disease, stage 4 (severe): Secondary | ICD-10-CM | POA: Diagnosis not present

## 2019-04-18 NOTE — Telephone Encounter (Signed)
Called patient back gave information on Dr. Radford Pax he was not sure how to make appointment. He will call for that now.

## 2019-04-19 ENCOUNTER — Other Ambulatory Visit: Payer: Self-pay

## 2019-04-19 DIAGNOSIS — M810 Age-related osteoporosis without current pathological fracture: Secondary | ICD-10-CM

## 2019-04-19 DIAGNOSIS — N184 Chronic kidney disease, stage 4 (severe): Secondary | ICD-10-CM

## 2019-04-19 DIAGNOSIS — I5032 Chronic diastolic (congestive) heart failure: Secondary | ICD-10-CM

## 2019-04-20 ENCOUNTER — Encounter (HOSPITAL_BASED_OUTPATIENT_CLINIC_OR_DEPARTMENT_OTHER): Payer: Medicare Other | Attending: Internal Medicine | Admitting: Internal Medicine

## 2019-04-20 ENCOUNTER — Other Ambulatory Visit: Payer: Self-pay

## 2019-04-20 DIAGNOSIS — L97822 Non-pressure chronic ulcer of other part of left lower leg with fat layer exposed: Secondary | ICD-10-CM | POA: Diagnosis not present

## 2019-04-20 DIAGNOSIS — I509 Heart failure, unspecified: Secondary | ICD-10-CM | POA: Diagnosis not present

## 2019-04-20 DIAGNOSIS — G9009 Other idiopathic peripheral autonomic neuropathy: Secondary | ICD-10-CM | POA: Diagnosis not present

## 2019-04-20 DIAGNOSIS — M1990 Unspecified osteoarthritis, unspecified site: Secondary | ICD-10-CM | POA: Diagnosis not present

## 2019-04-20 DIAGNOSIS — G4730 Sleep apnea, unspecified: Secondary | ICD-10-CM | POA: Diagnosis not present

## 2019-04-20 DIAGNOSIS — N184 Chronic kidney disease, stage 4 (severe): Secondary | ICD-10-CM | POA: Diagnosis not present

## 2019-04-20 DIAGNOSIS — I87312 Chronic venous hypertension (idiopathic) with ulcer of left lower extremity: Secondary | ICD-10-CM | POA: Diagnosis not present

## 2019-04-20 DIAGNOSIS — I890 Lymphedema, not elsewhere classified: Secondary | ICD-10-CM | POA: Diagnosis not present

## 2019-04-20 DIAGNOSIS — I130 Hypertensive heart and chronic kidney disease with heart failure and stage 1 through stage 4 chronic kidney disease, or unspecified chronic kidney disease: Secondary | ICD-10-CM | POA: Diagnosis not present

## 2019-04-20 DIAGNOSIS — L97222 Non-pressure chronic ulcer of left calf with fat layer exposed: Secondary | ICD-10-CM | POA: Diagnosis not present

## 2019-04-20 DIAGNOSIS — I872 Venous insufficiency (chronic) (peripheral): Secondary | ICD-10-CM | POA: Diagnosis not present

## 2019-04-20 DIAGNOSIS — G629 Polyneuropathy, unspecified: Secondary | ICD-10-CM | POA: Diagnosis not present

## 2019-04-20 NOTE — Progress Notes (Signed)
Arius, Harnois Fareed ([id number]) Visit Report for 04/20/2019 HPI Details Patient Name: Date of Service: CORRIGAN, KRETSCHMER 04/20/2019 3:00 PM Medical Record [id number] Patient Account Number: [id number] Date of Birth/Sex: Treating RN: [birth year] (79 y.o. M) Primary Care Provider: Garret Reddish Other Clinician: Referring Provider: Treating Provider/Extender:Abou Sterkel, Kandice Hams, Alexis Goodell in Treatment: 11 History of Present Illness HPI Description: 01/31/2019 on evaluation today patient actually appears to be doing poorly unfortunately secondary to an injury that occurred when he hit his leg on a car door September 9. He was seen in the ER where he had 13 sutures placed. His primary care provider remove those 2 weeks later and unfortunately the wound began to dehisce following. It appears that he may have had a skin flap that just did not take and survive. Nonetheless he was stated to have had Keflex given from the ER though the patient states he does not even remember that. He also is stated to have taken 2 rounds of doxycycline by his primary care provider but again the patient states he only remembers the most recent antibiotic which was actually the Augmentin then switched to Bactrim based on the culture results. The culture showed that he had Morganella Morgagni noted in the culture. Subsequently when he was taking the Bactrim he states that the redness got much better but then unfortunately worsened again once he came off of the medication. Apparently the patient was given triamcinolone ointment to put on the wound although obviously that really did not do much for the wound and again the patient tells me that he was "given the wrong ointment". Again I am not exactly sure what was going on in that scenario 1 where another. Either way I do believe that he has necrotic tissue on the surface of the wound this can require some debridement if he is able to tolerate it and then  subsequently he is also get a need likely a refill of the Bactrim in order to help treat the infection I do not believe this is completely cleared up at this point. I also think that he really needs to have edema control again he is very swollen and I believe that Performing a compression wrap will also be of great benefit for him. The patient does have chronic kidney disease stage IV along with peripheral neuropathy secondary to issues with his back. 02/07/2019 on evaluation today patient appears to be doing a little better with regard to the overall appearance of the wound although it appears to be very dry. Fortunately there is no signs of active infection at this time. No fever chills noted. I really feel like that the patient may be having issues at this point with the Iodoflex being too absorptive for him at this point which is causing this to dry out. I think we may want to try something different to see if that would be of benefit for him. 02/14/2019 on evaluation today patient actually appears to be doing much better with regard to his wound at this time. He has been tolerating the dressing changes without complication. Fortunately everything appears to be doing much better from the standpoint of how soft the dead tissue is today in fact I was able to debride away some of the necrotic tissue because it was doing so well. Fortunately he is showing signs of good improvement as of this week. 02/21/2019 on evaluation today patient feels like he is actually doing a little bit worse noticing the redness on his lower extremity. I agree  I have not seen this before and I am concerned about the possibility of infection. I think we may want to initiate something such as doxycycline and possibly wound culture today to see what exactly may be going on here. 02/28/19 evaluation today patient appears to be doing better with regard to his wound. Hes been tolerating the dressing changes without  complication. Fortunately theres no signs of active infection at this time. Overall I feel like the doxycycline has also been helpful for him. 03/07/2019 on evaluation today patient appears to be doing very well with regard to his lower extremity ulcer which is showing excellent improvement. Fortunately there is no evidence of active infection which is good news. No fevers, chills, nausea, vomiting, or diarrhea. Overall I feel like he is making excellent progress towards closure. 12/28; since the patient was last here he fell and apparently fractured his shoulder in 2 places. He is seeing orthopedics. Kept his wrap on for 2 weeks. He has a venous insufficiency wound on the posterior left calf. We have been using silver collagen under 3 layer compression 03/30/2019. Patient stopped taking his torsemide because he was going to the bathroom frequently which was difficult because of his fractured shoulder. He was apparently admitted to hospital for congestive heart failure. He tells [place] that they changed his compression on the left leg twice. His wound is measuring smaller. We have been using silver collagen 1/15; in general his leg looks better today. Still a necrotic surface on the posterior left leg. He has severe chronic venous insufficiency. He did bring in his stockings they look like they are 20/30 below-knee. I have encouraged them to use this on the right leg which has severe wound threatening edema 1/22; patient's edema control is under better control. He has an open area on the left posterior calf that is superficial but seems to be resistant going towards closure. He had a wrap injury on the left anterior upper tibia area today. 1/29; still moderate edema control. Left posterior calf wound appears to be contracting. He has the wrap injury on the left anterior upper tibia. We have been using Hydrofera Blue Electronic Signature(s) Signed: 04/20/2019 5:55:42 PM By: Linton Ham MD Entered  By: Linton Ham on 04/20/2019 17:45:33 -------------------------------------------------------------------------------- Physical Exam Details Patient Name: Date of Service: Yaron, Grasse Joedy 04/20/2019 3:00 PM Medical Record [id number] Patient Account Number: [id number] Date of Birth/Sex: Treating RN: Jul 22, 1940 (78 y.o. M) Primary Care Provider: Garret Reddish Other Clinician: Referring Provider: Treating Provider/Extender:Chrislyn Seedorf, Kandice Hams, Alexis Goodell in Treatment: 11 Constitutional Sitting or standing Blood Pressure is within target range for patient.. Pulse regular and within target range for patient.Marland Kitchen Respirations regular, non-labored and within target range.. Temperature is normal and within the target range for the patient.Marland Kitchen Appears in no distress. Eyes Conjunctivae clear. No discharge.no icterus. Respiratory work of breathing is normal. Cardiovascular Needle pulses are easily palpable. Nonpitting edema on the left. Lymphatic None palpable in the popliteal or inguinal area. Integumentary (Hair, Skin) Changes of chronic venous hypertension. Psychiatric appears at normal baseline. Notes Wound exam; left posterior calf this is come down in size. He still appears to have some blistering in the anterior leg wound. No wound looks infected and the dimensions seem smaller. Notable that his edema control is only moderate. We are using 3 layer compression Electronic Signature(s) Signed: 04/20/2019 5:55:42 PM By: Linton Ham MD Entered By: Linton Ham on 04/20/2019 17:47:23 -------------------------------------------------------------------------------- Physician Orders Details Patient Name: Date of Service: [name], [place] 04/20/2019 3:00 PM Medical  Record [id number] Patient Account Number: [id number] Date of Birth/Sex: Treating RN: [birth year] (79 y.o. [name] Primary Care Provider: Garret Reddish Other Clinician: Referring  Provider: Treating Provider/Extender:Rayquon Uselman, Kandice Hams, Alexis Goodell in Treatment: 11 Verbal / Phone Orders: No Diagnosis Coding ICD-10 Coding Code Description I87.2 Venous insufficiency (chronic) (peripheral) S81.802A Unspecified open wound, left lower leg, initial encounter L97.822 Non-pressure chronic ulcer of other part of left lower leg with fat layer exposed N18.4 Chronic kidney disease, stage 4 (severe) G90.09 Other idiopathic peripheral autonomic neuropathy Follow-up Appointments Return Appointment in 1 week. Dressing Change Frequency Wound #1 Left,Posterior Lower Leg Do not change entire dressing for one week. Skin Barriers/Peri-Wound Care Wound #1 Left,Posterior Lower Leg Moisturizing lotion - to leg Wound Cleansing Wound #1 Left,Posterior Lower Leg May shower with protection. Primary Wound Dressing Wound #1 Left,Posterior Lower Leg Hydrofera Blue - classic Wound #2 Left,Anterior Lower Leg Hydrofera Blue - classic Secondary Dressing Wound #1 Left,Posterior Lower Leg Dry Gauze ABD pad Wound #2 Left,Anterior Lower Leg ABD pad Edema Control 3 Layer Compression System - Left Lower Extremity Avoid standing for long periods of time Elevate legs to the level of the heart or above for 30 minutes daily and/or when sitting, a frequency of: - throughout the week Exercise regularly Support Garment 20-30 mm/Hg pressure to: - wear stocking to right lower leg. apply in the morning and remove at night before bed Electronic Signature(s) Signed: 04/20/2019 5:49:40 PM By: Kela Millin Signed: 04/20/2019 5:55:42 PM By: Linton Ham MD Entered By: Kela Millin on 04/20/2019 16:40:40 -------------------------------------------------------------------------------- Problem List Details Patient Name: Date of Service: Jaiden, Dinkins Raffi 04/20/2019 3:00 PM Medical Record [id number] Patient Account Number: [id number] Date of Birth/Sex: Treating RN: 1940-06-23 (78  y.o. [name] Primary Care Provider: Garret Reddish Other Clinician: Referring Provider: Treating Provider/Extender:Kyrell Ruacho, Kandice Hams, Alexis Goodell in Treatment: 11 Active Problems ICD-10 Evaluated Encounter Code Description Active Date Today Diagnosis I87.2 Venous insufficiency (chronic) (peripheral) 01/31/2019 No Yes S81.802A Unspecified open wound, left lower leg, initial 01/31/2019 No Yes encounter L97.822 Non-pressure chronic ulcer of other part of left lower 01/31/2019 No Yes leg with fat layer exposed N18.4 Chronic kidney disease, stage 4 (severe) 01/31/2019 No Yes G90.09 Other idiopathic peripheral autonomic neuropathy 01/31/2019 No Yes Inactive Problems Resolved Problems Electronic Signature(s) Signed: 04/20/2019 5:55:42 PM By: Linton Ham MD Entered By: Linton Ham on 04/20/2019 17:43:48 -------------------------------------------------------------------------------- Progress Note Details Patient Name: Date of Service: [name], [place] 04/20/2019 3:00 PM Medical Record [id number] Patient Account Number: [id number] Date of Birth/Sex: Treating RN: 1940/09/02 (79 y.o. M) Primary Care Provider: Garret Reddish Other Clinician: Referring Provider: Treating Provider/Extender:Daisy Lites, Kandice Hams, Alexis Goodell in Treatment: 11 Subjective History of Present Illness (HPI) 01/31/2019 on evaluation today patient actually appears to be doing poorly unfortunately secondary to an injury that occurred when he hit his leg on a car door September 9. He was seen in the ER where he had 13 sutures placed. His primary care provider remove those 2 weeks later and unfortunately the wound began to dehisce following. It appears that he may have had a skin flap that just did not take and survive. Nonetheless he was stated to have had Keflex given from the ER though the patient states he does not even remember that. He also is stated to have taken 2 rounds  of doxycycline by his primary care provider but again the patient states he only remembers the most recent antibiotic which was actually the Augmentin then switched to Bactrim based on the culture results. The  culture showed that he had Morganella Morgagni noted in the culture. Subsequently when he was taking the Bactrim he states that the redness got much better but then unfortunately worsened again once he came off of the medication. Apparently the patient was given triamcinolone ointment to put on the wound although obviously that really did not do much for the wound and again the patient tells me that he was "given the wrong ointment". Again I am not exactly sure what was going on in that scenario 1 where another. Either way I do believe that he has necrotic tissue on the surface of the wound this can require some debridement if he is able to tolerate it and then subsequently he is also get a need likely a refill of the Bactrim in order to help treat the infection I do not believe this is completely cleared up at this point. I also think that he really needs to have edema control again he is very swollen and I believe that Performing a compression wrap will also be of great benefit for him. The patient does have chronic kidney disease stage IV along with peripheral neuropathy secondary to issues with his back. 02/07/2019 on evaluation today patient appears to be doing a little better with regard to the overall appearance of the wound although it appears to be very dry. Fortunately there is no signs of active infection at this time. No fever chills noted. I really feel like that the patient may be having issues at this point with the Iodoflex being too absorptive for him at this point which is causing this to dry out. I think we may want to try something different to see if that would be of benefit for him. 02/14/2019 on evaluation today patient actually appears to be doing much better with  regard to his wound at this time. He has been tolerating the dressing changes without complication. Fortunately everything appears to be doing much better from the standpoint of how soft the dead tissue is today in fact I was able to debride away some of the necrotic tissue because it was doing so well. Fortunately he is showing signs of good improvement as of this week. 02/21/2019 on evaluation today patient feels like he is actually doing a little bit worse noticing the redness on his lower extremity. I agree I have not seen this before and I am concerned about the possibility of infection. I think we may want to initiate something such as doxycycline and possibly wound culture today to see what exactly may be going on here. 02/28/19 evaluation today patient appears to be doing better with regard to his wound. Heoos been tolerating the dressing changes without complication. Fortunately thereoos no signs of active infection at this time. Overall I feel like the doxycycline has also been helpful for him. 03/07/2019 on evaluation today patient appears to be doing very well with regard to his lower extremity ulcer which is showing excellent improvement. Fortunately there is no evidence of active infection which is good news. No fevers, chills, nausea, vomiting, or diarrhea. Overall I feel like he is making excellent progress towards closure. 12/28; since the patient was last here he fell and apparently fractured his shoulder in 2 places. He is seeing orthopedics. Kept his wrap on for 2 weeks. He has a venous insufficiency wound on the posterior left calf. We have been using silver collagen under 3 layer compression 03/30/2019. Patient stopped taking his torsemide because he was going to the bathroom  frequently which was difficult because of his fractured shoulder. He was apparently admitted to hospital for congestive heart failure. He tells [place] that they changed his compression on the left leg twice.  His wound is measuring smaller. We have been using silver collagen 1/15; in general his leg looks better today. Still a necrotic surface on the posterior left leg. He has severe chronic venous insufficiency. He did bring in his stockings they look like they are 20/30 below-knee. I have encouraged them to use this on the right leg which has severe wound threatening edema 1/22; patient's edema control is under better control. He has an open area on the left posterior calf that is superficial but seems to be resistant going towards closure. He had a wrap injury on the left anterior upper tibia area today. 1/29; still moderate edema control. Left posterior calf wound appears to be contracting. He has the wrap injury on the left anterior upper tibia. We have been using Hydrofera Blue Objective Constitutional Sitting or standing Blood Pressure is within target range for patient.. Pulse regular and within target range for patient.Marland Kitchen Respirations regular, non-labored and within target range.. Temperature is normal and within the target range for the patient.Marland Kitchen Appears in no distress. Vitals Time Taken: 4:12 PM, Height: 74 in, Source: Stated, Weight: 180 lbs, Source: Stated, BMI: 23.1, Temperature: 98.2 F, Pulse: 82 bpm, Respiratory Rate: 18 breaths/min, Blood Pressure: 112/52 mmHg. Eyes Conjunctivae clear. No discharge.no icterus. Respiratory work of breathing is normal. Cardiovascular Needle pulses are easily palpable. Nonpitting edema on the left. Lymphatic None palpable in the popliteal or inguinal area. Psychiatric appears at normal baseline. General Notes: Wound exam; left posterior calf this is come down in size. He still appears to have some blistering in the anterior leg wound. No wound looks infected and the dimensions seem smaller. Notable that his edema control is only moderate. We are using 3 layer compression Integumentary (Hair, Skin) Changes of chronic venous hypertension. Wound  #1 status is Open. Original cause of wound was Trauma. The wound is located on the Left,Posterior Lower Leg. The wound measures 1.6cm length x 1cm width x 0.1cm depth; 1.257cm^2 area and 0.126cm^3 volume. There is Fat Layer (Subcutaneous Tissue) Exposed exposed. There is no tunneling or undermining noted. There is a small amount of serosanguineous drainage noted. The wound margin is flat and intact. There is large (67-100%) red granulation within the wound bed. There is no necrotic tissue within the wound bed. Wound #2 status is Open. Original cause of wound was Blister. The wound is located on the Left,Anterior Lower Leg. The wound measures 0.8cm length x 0.5cm width x 0.1cm depth; 0.314cm^2 area and 0.031cm^3 volume. There is Fat Layer (Subcutaneous Tissue) Exposed exposed. There is no tunneling or undermining noted. There is a small amount of serosanguineous drainage noted. The wound margin is indistinct and nonvisible. There is large (67- 100%) pink granulation within the wound bed. There is no necrotic tissue within the wound bed. Assessment Active Problems ICD-10 Venous insufficiency (chronic) (peripheral) Unspecified open wound, left lower leg, initial encounter Non-pressure chronic ulcer of other part of left lower leg with fat layer exposed Chronic kidney disease, stage 4 (severe) Other idiopathic peripheral autonomic neuropathy Procedures Wound #1 Pre-procedure diagnosis of Wound #1 is a Venous Leg Ulcer located on the Left,Posterior Lower Leg . There was a Three Layer Compression Therapy Procedure by [name], RN. Post procedure Diagnosis Wound #1: Same as Pre-Procedure Wound #2 Pre-procedure diagnosis of Wound #2 is a  Trauma, Other located on the Left,Anterior Lower Leg . There was a Three Layer Compression Therapy Procedure by [name], RN. Post procedure Diagnosis Wound #2: Same as Pre-Procedure Plan Follow-up Appointments: Return Appointment in 1 week. Dressing  Change Frequency: Wound #1 Left,Posterior Lower Leg: Do not change entire dressing for one week. Skin Barriers/Peri-Wound Care: Wound #1 Left,Posterior Lower Leg: Moisturizing lotion - to leg Wound Cleansing: Wound #1 Left,Posterior Lower Leg: May shower with protection. Primary Wound Dressing: Wound #1 Left,Posterior Lower Leg: Hydrofera Blue - classic Wound #2 Left,Anterior Lower Leg: Hydrofera Blue - classic Secondary Dressing: Wound #1 Left,Posterior Lower Leg: Dry Gauze ABD pad Wound #2 Left,Anterior Lower Leg: ABD pad Edema Control: 3 Layer Compression System - Left Lower Extremity Avoid standing for long periods of time Elevate legs to the level of the heart or above for 30 minutes daily and/or when sitting, a frequency of: - throughout the week Exercise regularly Support Garment 20-30 mm/Hg pressure to: - wear stocking to right lower leg. apply in the morning and remove at night before bed 1. I continued with Hydrofera Blue as the dressing to both wound areas under 3 layer compression 2. I think he could go to 4 layer compression I do not sense an arterial issue here his ABI was within normal range. If he has not made significant progress next week that will be one of the things we do Electronic Signature(s) Signed: 04/20/2019 5:55:42 PM By: Linton Ham MD Entered By: Linton Ham on 04/20/2019 17:48:30 -------------------------------------------------------------------------------- SuperBill Details Patient Name: Date of Service: TAEJON, IRANI 04/20/2019 Medical Record [id number] Patient Account Number: [id number] Date of Birth/Sex: Treating RN: [birth year] (79 y.o. [name] Primary Care Provider: Garret Reddish Other Clinician: Referring Provider: Treating Provider/Extender:Talbert Trembath, Kandice Hams, Alexis Goodell in Treatment: 11 Diagnosis Coding ICD-10 Codes Code Description I87.2 Venous insufficiency (chronic) (peripheral) S81.802A  Unspecified open wound, left lower leg, initial encounter L97.822 Non-pressure chronic ulcer of other part of left lower leg with fat layer exposed N18.4 Chronic kidney disease, stage 4 (severe) G90.09 Other idiopathic peripheral autonomic neuropathy Facility Procedures CPT4 Code Description: [id number] (Facility Use Only) [phone] - [place] LWR LT LEG Modifier: Quantity: 1 Physician Procedures CPT4 Code Description: [id number] - WC PHYS LEVEL 3 - EST PT ICD-10 Diagnosis Description S81.802A Unspecified open wound, left lower leg, initial encount L97.822 Non-pressure chronic ulcer of other part of left lower I87.2 Venous insufficiency  (chronic) (peripheral) Modifier: er leg with fat laye Quantity: 1 r exposed Electronic Signature(s) Signed: 04/20/2019 5:55:42 PM By: Linton Ham MD Entered By: Linton Ham on 04/20/2019 17:48:57

## 2019-04-20 NOTE — Progress Notes (Addendum)
[name] ([id number]) Visit Report for 04/20/2019 Arrival Information Details Patient Name: Date of Service: [name], [name] 04/20/2019 3:00 PM Medical Record [id number] Patient Account Number: [id number] Date of Birth/Sex: Treating RN: 1940/08/19 (79 y.o. [name] Primary Care : Garret Reddish Other Clinician: Referring : Treating /Extender:Robson, Kandice Hams, Alexis Goodell in Treatment: 11 Visit Information History Since Last Visit Added or deleted any medications: No Patient Arrived: Ambulatory Any new allergies or adverse reactions: No Arrival Time: 16:11 Had a fall or experienced change in No Accompanied By: self activities of daily living that may affect Transfer Assistance: None risk of falls: Patient Identification Verified: Yes Signs or symptoms of abuse/neglect since last No Secondary Verification Process Completed: Yes visito Patient Requires Transmission-Based No Hospitalized since last visit: No Precautions: Implantable device outside of the clinic excluding No Patient Has Alerts: No cellular tissue based products placed in the center since last visit: Has Dressing in Place as Prescribed: Yes Has Compression in Place as Prescribed: Yes Pain Present Now: Yes Electronic Signature(s) Signed: 04/20/2019 5:55:15 PM By: Baruch Gouty RN, BSN Entered By: Baruch Gouty on 04/20/2019 16:12:02 -------------------------------------------------------------------------------- Compression Therapy Details Patient Name: Date of Service: [name] 04/20/2019 3:00 PM Medical Record [id number] Patient Account Number: [id number] Date of Birth/Sex: Treating RN: 1940/08/31 (79 y.o. [name] Primary Care : Garret Reddish Other Clinician: Referring : Treating /Extender:Robson, Kandice Hams, Alexis Goodell in Treatment: 11 Compression Therapy Performed for Wound Wound #1  Left,Posterior Lower Leg Assessment: Performed By: Clinician [name], RN Compression Type: Three Layer Post Procedure Diagnosis Same as Pre-procedure Electronic Signature(s) Signed: 04/20/2019 5:49:40 PM By: Kela Millin Entered By: Kela Millin on 04/20/2019 16:41:55 -------------------------------------------------------------------------------- Compression Therapy Details Patient Name: Date of Service: [name], [name] 04/20/2019 3:00 PM Medical Record [id number] Patient Account Number: [id number] Date of Birth/Sex: Treating RN: [birth year] (79 y.o. [name] Primary Care : Garret Reddish Other Clinician: Referring : Treating /Extender:Robson, Kandice Hams, Alexis Goodell in Treatment: 11 Compression Therapy Performed for Wound Wound #2 Left,Anterior Lower Leg Assessment: Performed By: Clinician [name], RN Compression Type: Three Layer Post Procedure Diagnosis Same as Pre-procedure Electronic Signature(s) Signed: 04/20/2019 5:49:40 PM By: Kela Millin Entered By: Kela Millin on 04/20/2019 16:41:55 -------------------------------------------------------------------------------- Encounter Discharge Information Details Patient Name: Date of Service: [name] 04/20/2019 3:00 PM Medical Record [id number] Patient Account Number: [id number] Date of Birth/Sex: Treating RN: 24-Oct-1940 (79 y.o. [name] Primary Care : Garret Reddish Other Clinician: Referring : Treating /Extender:Robson, Kandice Hams, Alexis Goodell in Treatment: 11 Encounter Discharge Information Items Discharge Condition: Stable Ambulatory Status: Ambulatory Discharge Destination: Home Transportation: Private Auto Accompanied By: self Schedule Follow-up Appointment: Yes Clinical Summary of Care: Electronic Signature(s) Signed: 04/20/2019 6:00:37 PM By: [name] Entered By: [name] on 04/20/2019 17:13:45 -------------------------------------------------------------------------------- Lower Extremity Assessment Details Patient Name: Date of Service: [name], [name] 04/20/2019 3:00 PM Medical Record [id number] Patient Account Number: [id number] Date of Birth/Sex: Treating RN: [birth year] (79 y.o. [name] Primary Care : Garret Reddish Other Clinician: Referring : Treating /Extender:Robson, Kandice Hams, Alexis Goodell in Treatment: 11 Edema Assessment Assessed: [Left: No] [Right: No] Edema: [Left: Ye] [Right: s] Calf Left: Right: Point of Measurement: 31 cm From Medial Instep 35.7 cm cm Ankle Left: Right: Point of Measurement: 12 cm From Medial Instep 24.7 cm cm Vascular Assessment Pulses: Dorsalis Pedis Palpable: [Left:Yes] Electronic Signature(s) Signed: 04/20/2019 5:55:15 PM By: Baruch Gouty RN, BSN Entered By: Baruch Gouty on 04/20/2019 16:22:36 -------------------------------------------------------------------------------- Multi Wound Chart Details Patient Name: Date of Service:  [name], [name] 04/20/2019 3:00 PM Medical Record [id number] Patient Account Number: [id number] Date of Birth/Sex: Treating RN: 05-10-1940 (79 y.o. M) Primary Care Alysia Scism: Garret Reddish Other Clinician: Referring Maitland Muhlbauer: Treating Briyah Wheelwright/Extender:Robson, Kandice Hams, Alexis Goodell in Treatment: 11 Vital Signs Height(in): 74 Pulse(bpm): 70 Weight(lbs): 180 Blood Pressure(mmHg): 112/52 Body Mass Index(BMI): 23 Temperature(F): 98.2 Respiratory 18 Rate(breaths/min): Photos: [1:No Photos] [2:No Photos] [N/A:N/A] Wound Location: [1:Left Lower Leg - Posterior Left Lower Leg - Anterior N/A] Wounding Event: [1:Trauma] [2:Blister] [N/A:N/A] Primary Etiology: [1:Venous Leg Ulcer] [2:Trauma, Other] [N/A:N/A] Comorbid History: [1:Cataracts, Sleep Apnea, Congestive Heart Failure, Congestive Heart Failure,  Peripheral Venous Disease, Peripheral Venous Disease, Osteoarthritis, Neuropathy Osteoarthritis, Neuropathy] [2:Cataracts, Sleep Apnea,] [N/A:N/A] Date Acquired: [1:11/29/2018] [2:04/13/2019] [N/A:N/A] Weeks of Treatment: [1:11] [2:1] [N/A:N/A] Wound Status: [1:Open] [2:Open] [N/A:N/A] Measurements L x W x D 1.6x1x0.1 [2:0.8x0.5x0.1] [N/A:N/A] (cm) Area (cm) : [1:1.257] [9:4.854] [N/A:N/A] Volume (cm) : [1:0.126] [2:0.031] [N/A:N/A] % Reduction in Area: [1:89.80%] [2:60.00%] [N/A:N/A] % Reduction in Volume: 94.90% [2:60.80%] [N/A:N/A] Classification: [1:Full Thickness Without Exposed Support Structures Exposed Support Structures] [2:Full Thickness Without] [N/A:N/A] Exudate Amount: [1:Small] [2:Small] [N/A:N/A] Exudate Type: [1:Serosanguineous] [2:Serosanguineous] [N/A:N/A] Exudate Color: [1:red, brown] [2:red, brown] [N/A:N/A] Wound Margin: [1:Flat and Intact] [2:Indistinct, nonvisible] [N/A:N/A] Granulation Amount: [1:Large (67-100%)] [2:Large (67-100%)] [N/A:N/A] Granulation Quality: [1:Red] [2:Pink] [N/A:N/A] Necrotic Amount: [1:None Present (0%)] [2:None Present (0%)] [N/A:N/A] Exposed Structures: [1:Fat Layer (Subcutaneous Fat Layer (Subcutaneous N/A Tissue) Exposed: Yes Fascia: No Tendon: No Muscle: No Joint: No Bone: No] [2:Tissue) Exposed: Yes Fascia: No Tendon: No Muscle: No Joint: No Bone: No] Epithelialization: [1:Small (1-33%)] [2:Large (67-100%) Compression Therapy] [N/A:N/A N/A] Treatment Notes Wound #1 (Left, Posterior Lower Leg) 1. Cleanse With Wound Cleanser Soap and water 2. Periwound Care Moisturizing lotion 3. Primary Dressing Applied Hydrofera Blue 4. Secondary Dressing Dry Gauze 6. Support Layer Applied 3 layer compression wrap Notes netting. Wound #2 (Left, Anterior Lower Leg) 1. Cleanse With Wound Cleanser Soap and water 2. Periwound Care Moisturizing lotion 3. Primary Dressing Applied Hydrofera Blue 4. Secondary Dressing Dry Gauze 6. Support  Layer Applied 3 layer compression wrap Notes netting. Electronic Signature(s) Signed: 04/20/2019 5:55:42 PM By: Linton Ham MD Entered By: Linton Ham on 04/20/2019 17:43:55 -------------------------------------------------------------------------------- Kevin Details Patient Name: Date of Service: [name], [name] 04/20/2019 3:00 PM Medical Record [id number] Patient Account Number: [id number] Date of Birth/Sex: Treating RN: [birth year] (78 y.o. [name] Primary Care Jenisa Monty: Garret Reddish Other Clinician: Referring Cassidy Tashiro: Treating Jarelly Rinck/Extender:Robson, Kandice Hams, Alexis Goodell in Treatment: 11 Active Inactive Venous Leg Ulcer Nursing Diagnoses: Knowledge deficit related to disease process and management Potential for venous Insuffiency (use before diagnosis confirmed) Goals: Patient will maintain optimal edema control Date Initiated: 01/31/2019 Target Resolution Date: 04/27/2019 Goal Status: Active Interventions: Assess peripheral edema status every visit. Compression as ordered Treatment Activities: Therapeutic compression applied : 01/31/2019 Notes: Wound/Skin Impairment Nursing Diagnoses: Impaired tissue integrity Knowledge deficit related to ulceration/compromised skin integrity Goals: Patient/caregiver will verbalize understanding of skin care regimen Date Initiated: 01/31/2019 Target Resolution Date: 04/27/2019 Goal Status: Active Ulcer/skin breakdown will have a volume reduction of 30% by week 4 Date Initiated: 01/31/2019 Date Inactivated: 03/07/2019 Target Resolution Date: 02/28/2019 Goal Status: Met Ulcer/skin breakdown will have a volume reduction of 50% by week 8 Date Initiated: 03/07/2019 Target Resolution Date: 04/27/2019 Goal Status: Active Interventions: Assess patient/caregiver ability to obtain necessary supplies Assess patient/caregiver ability to perform ulcer/skin care regimen upon  admission and as needed Assess ulceration(s) every visit Treatment Activities: Skin care regimen initiated : 01/31/2019 Topical wound management initiated :  01/31/2019 Notes: Electronic Signature(s) Signed: 04/20/2019 5:49:40 PM By: Kela Millin Entered By: Kela Millin on 04/20/2019 16:41:24 -------------------------------------------------------------------------------- Pain Assessment Details Patient Name: Date of Service: [name], [name] 04/20/2019 3:00 PM Medical Record [id number] Patient Account Number: [id number] Date of Birth/Sex: Treating RN: 02-Aug-1940 (79 y.o. [name] Primary Care : Garret Reddish Other Clinician: Referring : Treating /Extender:Robson, Kandice Hams, Alexis Goodell in Treatment: 11 Active Problems Location of Pain Severity and Description of Pain Patient Has Paino Yes Site Locations Rate the pain. Current Pain Level: 5 Pain Management and Medication Current Pain Management: Notes c/o ;left arm pain Electronic Signature(s) Signed: 04/20/2019 5:55:15 PM By: Baruch Gouty RN, BSN Entered By: Baruch Gouty on 04/20/2019 16:13:23 -------------------------------------------------------------------------------- Patient/Caregiver Education Details Patient Name: Date of Service: [name] 1/29/2021andnbsp3:00 PM Medical Record [id number] Patient Account Number: [id number] Date of Birth/Gender: 1940-11-13 (78 y.o. M) Treating RN: Kela Millin Primary Care Physician: Garret Reddish Other Clinician: Referring Physician: Treating Physician/Extender:Robson, Kandice Hams, Alexis Goodell in Treatment: 11 Education Assessment Education Provided To: Patient Education Topics Provided Wound/Skin Impairment: Methods: Explain/Verbal Responses: State content correctly [employer]) Signed: 04/20/2019 5:49:40 PM By: Kela Millin Entered By: Kela Millin on 04/20/2019  16:41:36 -------------------------------------------------------------------------------- Wound Assessment Details Patient Name: Date of Service: [name], [name] 04/20/2019 3:00 PM Medical Record [id number] Patient Account Number: [id number] Date of Birth/Sex: Treating RN: 02-24-1941 (78 y.o. M) Primary Care : Garret Reddish Other Clinician: Referring : Treating /Extender:Robson, Kandice Hams, Alexis Goodell in Treatment: 11 Wound Status Wound Number: 1 Primary Venous Leg Ulcer Etiology: Wound Location: Left Lower Leg - Posterior Wound Open Wounding Event: Trauma Status: Date Acquired: 11/29/2018 Comorbid Cataracts, Sleep Apnea, Congestive Heart Weeks Of Treatment: 11 History: Failure, Peripheral Venous Disease, Clustered Wound: No Osteoarthritis, Neuropathy Photos Wound Measurements Length: (cm) 1.6 % Reduct Width: (cm) 1 % Reduct Depth: (cm) 0.1 Epitheli Area: (cm) 1.257 Tunneli Volume: (cm) 0.126 Undermi Wound Description Full Thickness Without Exposed Support Foul Odo Classification: Structures Slough/F Wound Flat and Intact Margin: Exudate Small Amount: Exudate Serosanguineous Type: Exudate red, brown Color: Wound Bed Granulation Amount: Large (67-100%) Granulation Quality: Red Fascia E Necrotic Amount: None Present (0%) Fat Laye Tendon E Muscle E Joint Ex Bone Exp r After Cleansing: No ibrino Yes Exposed Structure xposed: No r (Subcutaneous Tissue) Exposed: Yes xposed: No xposed: No posed: No osed: No ion in Area: 89.8% ion in Volume: 94.9% alization: Small (1-33%) ng: No ning: No Electronic Signature(s) Signed: 05/01/2019 4:28:10 PM By: Mikeal Hawthorne EMT/HBOT Previous Signature: 04/20/2019 5:55:15 PM Version By: Baruch Gouty RN, BSN Entered By: Mikeal Hawthorne on 05/01/2019 14:41:30 -------------------------------------------------------------------------------- Wound Assessment Details Patient  Name: Date of Service: [name], [name] 04/20/2019 3:00 PM Medical Record [id number] Patient Account Number: [id number] Date of Birth/Sex: Treating RN: [birth year] (79 y.o. M) Primary Care : Garret Reddish Other Clinician: Referring : Treating /Extender:Robson, Kandice Hams, Alexis Goodell in Treatment: 11 Wound Status Wound Number: 2 Primary Trauma, Other Etiology: Wound Location: Left Lower Leg - Anterior Wound Open Wounding Event: Blister Status: Date Acquired: 04/13/2019 Comorbid Cataracts, Sleep Apnea, Congestive Heart Weeks Of Treatment: 1 History: Failure, Peripheral Venous Disease, Clustered Wound: No Osteoarthritis, Neuropathy Photos Wound Measurements Length: (cm) 0.8 % Reduct Width: (cm) 0.5 % Reduct Depth: (cm) 0.1 Epitheli Area: (cm) 0.314 Tunneli Volume: (cm) 0.031 Undermi Wound Description Classification: Full Thickness Without Exposed Support Foul Odo Structures Slough/F Wound Indistinct, nonvisible Margin: Exudate Small Amount: Exudate Serosanguineous Type: Exudate Exudate red, brown Color: Wound Bed Granulation Amount: Large (67-100%) Granulation Quality: Pink Fascia Ex Necrotic  Amount: None Present (0%) Fat Layer Tendon Ex Muscle Ex Joint Exp Bone Expo r After Cleansing: No ibrino No Exposed Structure posed: No (Subcutaneous Tissue) Exposed: Yes posed: No posed: No osed: No sed: No ion in Area: 60% ion in Volume: 60.8% alization: Large (67-100%) ng: No ning: No Treatment Notes Wound #2 (Left, Anterior Lower Leg) 1. Cleanse With Wound Cleanser Soap and water 2. Periwound Care Moisturizing lotion 3. Primary Dressing Applied Hydrofera Blue 4. Secondary Dressing Dry Gauze 6. Support Layer Applied 3 layer compression wrap Notes netting. Electronic Signature(s) Signed: 05/01/2019 4:28:10 PM By: Mikeal Hawthorne EMT/HBOT Previous Signature: 04/20/2019 5:55:15 PM Version By: Baruch Gouty RN,  BSN Entered By: Mikeal Hawthorne on 05/01/2019 14:38:04 -------------------------------------------------------------------------------- Vitals Details Patient Name: Date of Service: [name], [name] 04/20/2019 3:00 PM Medical Record [id number] Patient Account Number: [id number] Date of Birth/Sex: Treating RN: May 05, 1940 (78 y.o. [name] Primary Care Danett Palazzo: Garret Reddish Other Clinician: Referring Esparanza Krider: Treating Catelin Manthe/Extender:Robson, Kandice Hams, Alexis Goodell in Treatment: 11 Vital Signs Time Taken: 16:12 Temperature (F): 98.2 Height (in): 74 Pulse (bpm): 82 Source: Stated Respiratory Rate (breaths/min): 18 Weight (lbs): 180 Blood Pressure (mmHg): 112/52 Source: Stated Reference Range: 80 - 120 mg / dl Body Mass Index (BMI): 23.1 Electronic Signature(s) Signed: 04/20/2019 5:55:15 PM By: Baruch Gouty RN, BSN Entered By: Baruch Gouty on 04/20/2019 16:12:48

## 2019-04-23 DIAGNOSIS — S42295D Other nondisplaced fracture of upper end of left humerus, subsequent encounter for fracture with routine healing: Secondary | ICD-10-CM | POA: Diagnosis not present

## 2019-04-25 DIAGNOSIS — I130 Hypertensive heart and chronic kidney disease with heart failure and stage 1 through stage 4 chronic kidney disease, or unspecified chronic kidney disease: Secondary | ICD-10-CM | POA: Diagnosis not present

## 2019-04-25 DIAGNOSIS — M79671 Pain in right foot: Secondary | ICD-10-CM | POA: Diagnosis not present

## 2019-04-25 DIAGNOSIS — M1030 Gout due to renal impairment, unspecified site: Secondary | ICD-10-CM | POA: Diagnosis not present

## 2019-04-25 DIAGNOSIS — I5033 Acute on chronic diastolic (congestive) heart failure: Secondary | ICD-10-CM | POA: Diagnosis not present

## 2019-04-25 DIAGNOSIS — D631 Anemia in chronic kidney disease: Secondary | ICD-10-CM | POA: Diagnosis not present

## 2019-04-25 DIAGNOSIS — N184 Chronic kidney disease, stage 4 (severe): Secondary | ICD-10-CM | POA: Diagnosis not present

## 2019-04-27 ENCOUNTER — Encounter (HOSPITAL_BASED_OUTPATIENT_CLINIC_OR_DEPARTMENT_OTHER): Payer: Medicare Other | Admitting: Internal Medicine

## 2019-04-27 ENCOUNTER — Other Ambulatory Visit: Payer: Self-pay

## 2019-04-27 DIAGNOSIS — I872 Venous insufficiency (chronic) (peripheral): Secondary | ICD-10-CM | POA: Insufficient documentation

## 2019-04-27 DIAGNOSIS — G4730 Sleep apnea, unspecified: Secondary | ICD-10-CM | POA: Diagnosis not present

## 2019-04-27 DIAGNOSIS — I509 Heart failure, unspecified: Secondary | ICD-10-CM | POA: Diagnosis not present

## 2019-04-27 DIAGNOSIS — D631 Anemia in chronic kidney disease: Secondary | ICD-10-CM | POA: Diagnosis not present

## 2019-04-27 DIAGNOSIS — I130 Hypertensive heart and chronic kidney disease with heart failure and stage 1 through stage 4 chronic kidney disease, or unspecified chronic kidney disease: Secondary | ICD-10-CM | POA: Diagnosis not present

## 2019-04-27 DIAGNOSIS — M1990 Unspecified osteoarthritis, unspecified site: Secondary | ICD-10-CM | POA: Insufficient documentation

## 2019-04-27 DIAGNOSIS — L97822 Non-pressure chronic ulcer of other part of left lower leg with fat layer exposed: Secondary | ICD-10-CM | POA: Diagnosis not present

## 2019-04-27 DIAGNOSIS — G9009 Other idiopathic peripheral autonomic neuropathy: Secondary | ICD-10-CM | POA: Diagnosis not present

## 2019-04-27 DIAGNOSIS — S81802A Unspecified open wound, left lower leg, initial encounter: Secondary | ICD-10-CM | POA: Diagnosis not present

## 2019-04-27 DIAGNOSIS — I5033 Acute on chronic diastolic (congestive) heart failure: Secondary | ICD-10-CM | POA: Diagnosis not present

## 2019-04-27 DIAGNOSIS — N184 Chronic kidney disease, stage 4 (severe): Secondary | ICD-10-CM | POA: Diagnosis not present

## 2019-04-27 DIAGNOSIS — M1030 Gout due to renal impairment, unspecified site: Secondary | ICD-10-CM | POA: Diagnosis not present

## 2019-04-27 DIAGNOSIS — L97222 Non-pressure chronic ulcer of left calf with fat layer exposed: Secondary | ICD-10-CM | POA: Diagnosis not present

## 2019-04-27 NOTE — Progress Notes (Addendum)
LUNDBERG, Mann ([id number]) Visit Report for 04/27/2019 Arrival Information Details Patient Name: Date of Service: WARDEN, BUFFA 04/27/2019 3:00 PM Medical Record [id number] Patient Account Number: [id number] Date of Birth/Sex: Treating RN: [birth year] (79 y.o. [name]) Carlene Coria Primary Care Freada Twersky: Garret Reddish Other Clinician: Referring Bellami Farrelly: Treating Maanasa Aderhold/Extender:Robson, Kandice Hams, Alexis Goodell in Treatment: 12 Visit Information History Since Last Visit All ordered tests and consults were completed: No Patient Arrived: Ambulatory Added or deleted any medications: No Arrival Time: 15:23 Any new allergies or adverse reactions: No Accompanied By: self Had a fall or experienced change in No Transfer Assistance: None activities of daily living that may affect Patient Identification Verified: Yes risk of falls: Secondary Verification Process Completed: Yes Signs or symptoms of abuse/neglect since last No Patient Requires Transmission-Based No visito Precautions: Hospitalized since last visit: No Patient Has Alerts: No Implantable device outside of the clinic excluding No cellular tissue based products placed in the center since last visit: Has Dressing in Place as Prescribed: Yes Pain Present Now: No Electronic Signature(s) Signed: 04/27/2019 5:25:54 PM By: Carlene Coria RN Entered By: Carlene Coria on 04/27/2019 15:29:11 -------------------------------------------------------------------------------- Compression Therapy Details Patient Name: Date of Service: [name] 04/27/2019 3:00 PM Medical Record [id number] Patient Account Number: [id number] Date of Birth/Sex: Treating RN: June 06, 1940 (79 y.o. [name] Primary Care Faithlynn Deeley: Garret Reddish Other Clinician: Referring Ruqaya Strauss: Treating Tomiko Schoon/Extender:Robson, Kandice Hams, Alexis Goodell in Treatment: 12 Compression Therapy Performed for Wound Wound #1 Left,Posterior  Lower Leg Assessment: Performed By: Clinician [name], RN Compression Type: Three Layer Post Procedure Diagnosis Same as Pre-procedure Electronic Signature(s) Signed: 04/27/2019 5:49:59 PM By: Kela Millin Entered By: Kela Millin on 04/27/2019 16:37:09 -------------------------------------------------------------------------------- Encounter Discharge Information Details Patient Name: Date of Service: [name] 04/27/2019 3:00 PM Medical Record [id number] Patient Account Number: [id number] Date of Birth/Sex: Treating RN: 1941-01-23 (79 y.o. [name] Primary Care Gwenivere Hiraldo: Garret Reddish Other Clinician: Referring Zoie Sarin: Treating Serapio Edelson/Extender:Robson, Kandice Hams, Alexis Goodell in Treatment: 12 Encounter Discharge Information Items Discharge Condition: Stable Ambulatory Status: Ambulatory Discharge Destination: Home Transportation: Private Auto Accompanied By: self Schedule Follow-up Appointment: Yes Clinical Summary of Care: Electronic Signature(s) Signed: 04/27/2019 5:54:34 PM By: [name] Entered By: [name] on 04/27/2019 16:52:03 -------------------------------------------------------------------------------- Lower Extremity Assessment Details Patient Name: Date of Service: [name] 04/27/2019 3:00 PM Medical Record [id number] Patient Account Number: [id number] Date of Birth/Sex: Treating RN: 1940/05/18 (79 y.o. [name]) Carlene Coria Primary Care Yee Gangi: Garret Reddish Other Clinician: Referring Anacarolina Evelyn: Treating Kadence Mimbs/Extender:Robson, Kandice Hams, Alexis Goodell in Treatment: 12 Edema Assessment Assessed: [Left: No] [Right: No] Edema: [Left: Ye] [Right: s] Calf Left: Right: Point of Measurement: 31 cm From Medial Instep 35.7 cm cm Ankle Left: Right: Point of Measurement: 12 cm From Medial Instep 24.7 cm cm Electronic Signature(s) Signed: 04/27/2019 5:25:54 PM By: Carlene Coria RN Entered By:  Carlene Coria on 04/27/2019 15:37:40 -------------------------------------------------------------------------------- Multi Wound Chart Details Patient Name: Date of Service: [name] 04/27/2019 3:00 PM Medical Record [id number] Patient Account Number: [id number] Date of Birth/Sex: Treating RN: [birth year] (79 y.o. M) Primary Care Claus Silvestro: Garret Reddish Other Clinician: Referring Katherine Syme: Treating Hamlet Lasecki/Extender:Robson, Kandice Hams, Alexis Goodell in Treatment: 12 Vital Signs Height(in): 74 Pulse(bpm): 93 Weight(lbs): 180 Blood Pressure(mmHg): 108/69 Body Mass Index(BMI): 23 Temperature(F): 98.3 Respiratory 18 Rate(breaths/min): Photos: [1:No Photos] [2:No Photos] [N/A:N/A] Wound Location: [1:Left, Posterior Lower Leg Left Lower Leg - Anterior] [N/A:N/A] Wounding Event: [1:Trauma] [2:Blister] [N/A:N/A] Primary Etiology: [1:Venous Leg Ulcer] [2:Trauma, Other] [N/A:N/A] Comorbid History: [1:Cataracts, Sleep Apnea, Congestive Heart Failure, Congestive Heart Failure,  Peripheral Venous Disease, Peripheral Venous Disease, Osteoarthritis, Neuropathy Osteoarthritis, Neuropathy] [2:Cataracts, Sleep Apnea,] [N/A:N/A] Date Acquired: [1:11/29/2018] [2:04/13/2019] [N/A:N/A] Weeks of Treatment: [1:12] [2:2] [N/A:N/A] Wound Status: [1:Open] [2:Healed - Epithelialized] [N/A:N/A] Measurements L x W x D 0.5x0.4x0.1 [2:0x0x0] [N/A:N/A] (cm) Area (cm) : [1:0.157] [2:0] [N/A:N/A] Volume (cm) : [1:0.016] [2:0] [N/A:N/A] % Reduction in Area: [1:98.70%] [2:100.00%] [N/A:N/A] % Reduction in Volume: 99.30% [2:100.00%] [N/A:N/A] Classification: [1:Full Thickness Without Exposed Support Structures Exposed Support Structures] [2:Full Thickness Without] [N/A:N/A] Exudate Amount: [1:Small] [2:None Present] [N/A:N/A] Exudate Type: [1:Serosanguineous] [2:N/A] [N/A:N/A] Exudate Color: [1:red, brown] [2:N/A] [N/A:N/A] Wound Margin: [1:Flat and Intact] [2:Indistinct, nonvisible]  [N/A:N/A] Granulation Amount: [1:Large (67-100%)] [2:None Present (0%)] [N/A:N/A] Granulation Quality: [1:Red] [2:N/A] [N/A:N/A] Necrotic Amount: [1:Small (1-33%)] [2:None Present (0%)] [N/A:N/A] Exposed Structures: [1:Fat Layer (Subcutaneous Tissue) Exposed: Yes Fascia: No Tendon: No Muscle: No Joint: No Bone: No] [2:Fascia: No Fat Layer (Subcutaneous Tissue) Exposed: No Tendon: No Muscle: No Joint: No Bone: No] [N/A:N/A] Epithelialization: [1:Small (1-33%) Compression Therapy] [2:None N/A] [N/A:N/A N/A] Treatment Notes Wound #1 (Left, Posterior Lower Leg) 1. Cleanse With Wound Cleanser Soap and water 2. Periwound Care Moisturizing lotion 3. Primary Dressing Applied Hydrofera Blue 4. Secondary Dressing Dry Gauze 6. Support Layer Applied 3 layer compression wrap Notes netting. Electronic Signature(s) Signed: 04/27/2019 5:45:44 PM By: Linton Ham MD Entered By: Linton Ham on 04/27/2019 17:34:14 -------------------------------------------------------------------------------- Multi-Disciplinary Care Plan Details Patient Name: Date of Service: OBEDIAH, WELLES 04/27/2019 3:00 PM Medical Record [id number] Patient Account Number: [id number] Date of Birth/Sex: Treating RN: 1940/07/27 (78 y.o. [name] Primary Care Jams Trickett: Garret Reddish Other Clinician: Referring Mohsen Odenthal: Treating Savvy Peeters/Extender:Robson, Kandice Hams, Alexis Goodell in Treatment: 12 Active Inactive Venous Leg Ulcer Nursing Diagnoses: Knowledge deficit related to disease process and management Potential for venous Insuffiency (use before diagnosis confirmed) Goals: Patient will maintain optimal edema control Date Initiated: 01/31/2019 Target Resolution Date: 06/01/2019 Goal Status: Active Interventions: Assess peripheral edema status every visit. Compression as ordered Treatment Activities: Therapeutic compression applied : 01/31/2019 Notes: Wound/Skin Impairment Nursing  Diagnoses: Impaired tissue integrity Knowledge deficit related to ulceration/compromised skin integrity Goals: Patient/caregiver will verbalize understanding of skin care regimen Date Initiated: 01/31/2019 Target Resolution Date: 06/01/2019 Goal Status: Active Ulcer/skin breakdown will have a volume reduction of 30% by week 4 Date Initiated: 01/31/2019 Date Inactivated: 03/07/2019 Target Resolution Date: 02/28/2019 Goal Status: Met Ulcer/skin breakdown will have a volume reduction of 50% by week 8 Date Initiated: 03/07/2019 Target Resolution Date: 06/01/2019 Goal Status: Active Interventions: Assess patient/caregiver ability to obtain necessary supplies Assess patient/caregiver ability to perform ulcer/skin care regimen upon admission and as needed Assess ulceration(s) every visit Treatment Activities: Skin care regimen initiated : 01/31/2019 Topical wound management initiated : 01/31/2019 Notes: Electronic Signature(s) Signed: 04/27/2019 5:49:59 PM By: Kela Millin Entered By: Kela Millin on 04/27/2019 16:36:17 -------------------------------------------------------------------------------- Pain Assessment Details Patient Name: Date of Service: Chrishaun, [place] 04/27/2019 3:00 PM Medical Record [id number] Patient Account Number: [id number] Date of Birth/Sex: Treating RN: 1940/06/09 (78 y.o. [name]) Carlene Coria Primary Care Eliah Marquard: Garret Reddish Other Clinician: Referring Thanvi Blincoe: Treating Rylah Fukuda/Extender:Robson, Kandice Hams, Alexis Goodell in Treatment: 12 Active Problems Location of Pain Severity and Description of Pain Patient Has Paino No Site Locations Pain Management and Medication Current Pain Management: Electronic Signature(s) Signed: 04/27/2019 5:25:54 PM By: Carlene Coria RN Entered By: Carlene Coria on 04/27/2019 15:29:51 -------------------------------------------------------------------------------- Patient/Caregiver Education Details Patient  Name: Date of Service: Michael Graff Tyquan 2/5/2021andnbsp3:00 PM Medical Record [id number] Patient Account Number: [id number] Date of Birth/Gender: Apr 03, 1940 (78 y.o. M) Treating RN: Kela Millin  Primary Care Physician: Garret Reddish Other Clinician: Referring Physician: Treating Physician/Extender:Robson, Kandice Hams, Alexis Goodell in Treatment: 12 Education Assessment Education Provided To: Patient Education Topics Provided Wound/Skin Impairment: Methods: Explain/Verbal Responses: State content correctly Electronic Signature(s) Signed: 04/27/2019 5:49:59 PM By: Kela Millin Entered By: Kela Millin on 04/27/2019 16:36:28 -------------------------------------------------------------------------------- Wound Assessment Details Patient Name: Date of Service: Ahmad, Vanwey Lem 04/27/2019 3:00 PM Medical Record [id number] Patient Account Number: [id number] Date of Birth/Sex: Treating RN: 11/15/1940 (78 y.o. [name]) Carlene Coria Primary Care Adolphe Fortunato: Garret Reddish Other Clinician: Referring Treveon Bourcier: Treating Issacc Merlo/Extender:Robson, Kandice Hams, Alexis Goodell in Treatment: 12 Wound Status Wound Number: 1 Primary Venous Leg Ulcer Etiology: Wound Location: Left Lower Leg - Posterior Wound Open Wounding Event: Trauma Status: Date Acquired: 11/29/2018 Comorbid Cataracts, Sleep Apnea, Congestive Heart Weeks Of Treatment: 12 History: Failure, Peripheral Venous Disease, Clustered Wound: No Osteoarthritis, Neuropathy Photos Wound Measurements Length: (cm) 0.5 % Reduct Width: (cm) 0.4 % Reduct Depth: (cm) 0.1 Epitheli Area: (cm) 0.157 Tunneli Volume: (cm) 0.016 Undermi Wound Description Classification: Full Thickness Without Exposed Support Foul Odo Structures Slough/F Wound Flat and Intact Margin: Exudate Small Amount: Exudate Serosanguineous Type: Exudate red, brown Color: Wound Bed Granulation Amount: Large (67-100%) Granulation  Quality: Red Fascia Ex Necrotic Amount: Small (1-33%) Fat Layer Necrotic Quality: Adherent Slough Tendon Ex Muscle Ex Joint Exp Bone Expo r After Cleansing: No ibrino Yes Exposed Structure posed: No (Subcutaneous Tissue) Exposed: Yes posed: No posed: No osed: No sed: No ion in Area: 98.7% ion in Volume: 99.3% alization: Small (1-33%) ng: No ning: No Treatment Notes Wound #1 (Left, Posterior Lower Leg) 1. Cleanse With Wound Cleanser Soap and water 2. Periwound Care Moisturizing lotion 3. Primary Dressing Applied Hydrofera Blue 4. Secondary Dressing Dry Gauze 6. Support Layer Applied 3 layer compression wrap Notes netting. Electronic Signature(s) Signed: 05/01/2019 4:28:10 PM By: Mikeal Hawthorne EMT/HBOT Signed: 05/01/2019 5:19:34 PM By: Carlene Coria RN Previous Signature: 04/27/2019 5:25:54 PM Version By: Carlene Coria RN Entered By: Mikeal Hawthorne on 05/01/2019 16:13:40 -------------------------------------------------------------------------------- Wound Assessment Details Patient Name: Date of Service: Laster, Appling Burk 04/27/2019 3:00 PM Medical Record [id number] Patient Account Number: [id number] Date of Birth/Sex: Treating RN: 1940/07/01 (79 y.o. M) Primary Care Antanette Richwine: Garret Reddish Other Clinician: Referring Amine Adelson: Treating Kanai Hilger/Extender:Robson, Kandice Hams, Alexis Goodell in Treatment: 12 Wound Status Wound Number: 2 Primary Trauma, Other Etiology: Wound Location: Left Lower Leg - Anterior Wound Open Wounding Event: Blister Status: Date Acquired: 04/13/2019 Comorbid Cataracts, Sleep Apnea, Congestive Heart Weeks Of Treatment: 2 History: Failure, Peripheral Venous Disease, Clustered Wound: No Osteoarthritis, Neuropathy Photos Wound Measurements Length: (cm) 0 % Reduct Width: (cm) 0 % Reduct Depth: (cm) 0 Epitheli Area: (cm) 0 Tunneli Volume: (cm) 0 Undermi Wound Description Classification: Full Thickness Without Exposed Support  Foul Odo Structures Slough/F Wound Indistinct, nonvisible Margin: Exudate None Present Amount: Wound Bed Granulation Amount: None Present (0%) Necrotic Amount: None Present (0%) Fascia E Fat Laye Tendon E Muscle E Joint Ex Bone Exp r After Cleansing: No ibrino No Exposed Structure xposed: No r (Subcutaneous Tissue) Exposed: No xposed: No xposed: No posed: No osed: No ion in Area: 100% ion in Volume: 100% alization: None ng: No ning: No Electronic Signature(s) Signed: 05/02/2019 4:31:42 PM By: Mikeal Hawthorne EMT/HBOT Previous Signature: 04/27/2019 5:49:59 PM Version By: Kela Millin Entered By: Mikeal Hawthorne on 05/02/2019 13:25:43 -------------------------------------------------------------------------------- Vitals Details Patient Name: Date of Service: Michael Graff, Jayshaun 04/27/2019 3:00 PM Medical Record [id number] Patient Account Number: [id number] Date of Birth/Sex: Treating RN: Sep 08, 1940 (78 y.o. [name]) Dolores Lory, [place]  Primary Care Yarnell Kozloski: Garret Reddish Other Clinician: Referring Zoraida Havrilla: Treating Natelie Ostrosky/Extender:Robson, Kandice Hams, Alexis Goodell in Treatment: 12 Vital Signs Time Taken: 15:29 Temperature (F): 98.3 Height (in): 74 Pulse (bpm): 93 Weight (lbs): 180 Respiratory Rate (breaths/min): 18 Body Mass Index (BMI): 23.1 Blood Pressure (mmHg): 108/69 Reference Range: 80 - 120 mg / dl Electronic Signature(s) Signed: 04/27/2019 5:25:54 PM By: Carlene Coria RN Entered By: Carlene Coria on 04/27/2019 15:29:43

## 2019-05-04 ENCOUNTER — Encounter (HOSPITAL_BASED_OUTPATIENT_CLINIC_OR_DEPARTMENT_OTHER): Payer: Medicare Other | Attending: Internal Medicine | Admitting: Internal Medicine

## 2019-05-04 ENCOUNTER — Other Ambulatory Visit: Payer: Self-pay

## 2019-05-04 DIAGNOSIS — M1990 Unspecified osteoarthritis, unspecified site: Secondary | ICD-10-CM | POA: Diagnosis not present

## 2019-05-04 DIAGNOSIS — G4730 Sleep apnea, unspecified: Secondary | ICD-10-CM | POA: Diagnosis not present

## 2019-05-04 DIAGNOSIS — N184 Chronic kidney disease, stage 4 (severe): Secondary | ICD-10-CM | POA: Diagnosis not present

## 2019-05-04 DIAGNOSIS — S81802A Unspecified open wound, left lower leg, initial encounter: Secondary | ICD-10-CM | POA: Diagnosis not present

## 2019-05-04 DIAGNOSIS — I872 Venous insufficiency (chronic) (peripheral): Secondary | ICD-10-CM | POA: Diagnosis not present

## 2019-05-04 DIAGNOSIS — I509 Heart failure, unspecified: Secondary | ICD-10-CM | POA: Diagnosis not present

## 2019-05-04 DIAGNOSIS — G9009 Other idiopathic peripheral autonomic neuropathy: Secondary | ICD-10-CM | POA: Diagnosis not present

## 2019-05-04 DIAGNOSIS — L97822 Non-pressure chronic ulcer of other part of left lower leg with fat layer exposed: Secondary | ICD-10-CM | POA: Diagnosis not present

## 2019-05-07 NOTE — Progress Notes (Signed)
Michael, Philbrook Rivera ([id number]) Visit Report for 05/04/2019 Arrival Information Details Patient Name: Date of Service: [name], [name] 05/04/2019 2:45 PM Medical Record [id number] Patient Account Number: [id number] Date of Birth/Sex: Treating RN: [birth year] (79 y.o. Janyth Contes Primary Care Habib Kise: Garret Reddish Other Clinician: Referring Shine Scrogham: Treating Damiano Stamper/Extender:Robson, Kandice Hams, Alexis Goodell in Treatment: 20 Visit Information History Since Last Visit Added or deleted any medications: No Patient Arrived: Ambulatory Any new allergies or adverse reactions: No Arrival Time: 15:17 Had a fall or experienced change in No Accompanied By: alone activities of daily living that may affect Transfer Assistance: None risk of falls: Patient Identification Verified: Yes Signs or symptoms of abuse/neglect since last No Secondary Verification Process Completed: Yes visito Patient Requires Transmission-Based No Hospitalized since last visit: No Precautions: Implantable device outside of the clinic excluding No Patient Has Alerts: No cellular tissue based products placed in the center since last visit: Has Dressing in Place as Prescribed: Yes Has Compression in Place as Prescribed: Yes Pain Present Now: No Electronic Signature(s) Signed: 05/07/2019 6:14:31 PM By: [name] RN, BSN Entered By: [name] on 05/04/2019 15:18:02 -------------------------------------------------------------------------------- Clinic Level of Care Assessment Details Patient Name: Date of Service: Michael, Beehler Rivera 05/04/2019 2:45 PM Medical Record [id number] Patient Account Number: [id number] Date of Birth/Sex: Treating RN: [birth year] (79 y.o. [name] Primary Care Santanna Olenik: Garret Reddish Other Clinician: Referring Nahomi Hegner: Treating Apolonia Ellwood/Extender:Robson, Kandice Hams, Alexis Goodell in Treatment: 13 Clinic Level of Care Assessment Items TOOL  4 Quantity Score X - Use when only an EandM is performed on FOLLOW-UP visit 1 0 ASSESSMENTS - Nursing Assessment / Reassessment X - Reassessment of Co-morbidities (includes updates in patient status) 1 10 X - Reassessment of Adherence to Treatment Plan 1 5 ASSESSMENTS - Wound and Skin Assessment / Reassessment X - Simple Wound Assessment / Reassessment - one wound 1 5 []  - Complex Wound Assessment / Reassessment - multiple wounds 0 []  - Dermatologic / Skin Assessment (not related to wound area) 0 ASSESSMENTS - Focused Assessment X - Circumferential Edema Measurements - multi extremities 1 5 []  - Nutritional Assessment / Counseling / Intervention 0 []  - Lower Extremity Assessment (monofilament, tuning fork, pulses) 0 []  - Peripheral Arterial Disease Assessment (using hand held doppler) 0 ASSESSMENTS - Ostomy and/or Continence Assessment and Care []  - Incontinence Assessment and Management 0 []  - Ostomy Care Assessment and Management (repouching, etc.) 0 PROCESS - Coordination of Care X - Simple Patient / Family Education for ongoing care 1 15 []  - Complex (extensive) Patient / Family Education for ongoing care 0 X - Staff obtains Programmer, systems, Records, Test Results / Process Orders 1 10 []  - Staff telephones HHA, Nursing Homes / Clarify orders / etc 0 []  - Routine Transfer to another Facility (non-emergent condition) 0 []  - Routine Hospital Admission (non-emergent condition) 0 []  - New Admissions / Biomedical engineer / Ordering NPWT, Apligraf, etc. 0 []  - Emergency Hospital Admission (emergent condition) 0 X - Simple Discharge Coordination 1 10 []  - Complex (extensive) Discharge Coordination 0 PROCESS - Special Needs []  - Pediatric / Minor Patient Management 0 []  - Isolation Patient Management 0 []  - Hearing / Language / Visual special needs 0 []  - Assessment of Community assistance (transportation, D/C planning, etc.) 0 []  - Additional assistance / Altered mentation 0 []  - Support  Surface(s) Assessment (bed, cushion, seat, etc.) 0 INTERVENTIONS - Wound Cleansing / Measurement X - Simple Wound Cleansing - one wound 1 5 []  - Complex Wound Cleansing - multiple wounds  0 X - Wound Imaging (photographs - any number of wounds) 1 5 []  - Wound Tracing (instead of photographs) 0 X - Simple Wound Measurement - one wound 1 5 []  - Complex Wound Measurement - multiple wounds 0 INTERVENTIONS - Wound Dressings []  - Small Wound Dressing one or multiple wounds 0 []  - Medium Wound Dressing one or multiple wounds 0 []  - Large Wound Dressing one or multiple wounds 0 []  - Application of Medications - topical 0 []  - Application of Medications - injection 0 INTERVENTIONS - Miscellaneous []  - External ear exam 0 []  - Specimen Collection (cultures, biopsies, blood, body fluids, etc.) 0 []  - Specimen(s) / Culture(s) sent or taken to Lab for analysis 0 []  - Patient Transfer (multiple staff / Civil Service fast streamer / Similar devices) 0 []  - Simple Staple / Suture removal (25 or less) 0 []  - Complex Staple / Suture removal (26 or more) 0 []  - Hypo / Hyperglycemic Management (close monitor of Blood Glucose) 0 []  - Ankle / Brachial Index (ABI) - do not check if billed separately 0 X - Vital Signs 1 5 Has the patient been seen at the hospital within the last three years: Yes Total Score: 80 Level Of Care: New/Established - Level 3 Electronic Signature(s) Signed: 05/07/2019 6:12:41 PM By: Kela Millin Entered By: Kela Millin on 05/04/2019 16:27:47 -------------------------------------------------------------------------------- Lower Extremity Assessment Details Patient Name: Date of Service: Michael, Omahoney Rivera 05/04/2019 2:45 PM Medical Record [id number] Patient Account Number: [id number] Date of Birth/Sex: Treating RN: 1940-10-15 (79 y.o. Janyth Contes Primary Care Gilma Bessette: Garret Reddish Other Clinician: Referring Zowie Lundahl: Treating Annastasia Haskins/Extender:Robson, Kandice Hams,  Alexis Goodell in Treatment: 13 Edema Assessment Assessed: [Left: No] [Right: No] Edema: [Left: Ye] [Right: s] Calf Left: Right: Point of Measurement: 31 cm From Medial Instep 35 cm cm Ankle Left: Right: Point of Measurement: 12 cm From Medial Instep 24.6 cm cm Vascular Assessment Pulses: Dorsalis Pedis Palpable: [Left:Yes] Electronic Signature(s) Signed: 05/07/2019 6:14:31 PM By: [name] RN, BSN Entered By: [name] on 05/04/2019 15:23:17 -------------------------------------------------------------------------------- Multi Wound Chart Details Patient Name: Date of Service: [name], Tlaloc 05/04/2019 2:45 PM Medical Record [id number] Patient Account Number: [id number] Date of Birth/Sex: Treating RN: [birth year] (79 y.o. [name] Primary Care Deandria Klute: Garret Reddish Other Clinician: Referring Kirk Basquez: Treating Willisha Sligar/Extender:Robson, Kandice Hams, Alexis Goodell in Treatment: 13 Vital Signs Height(in): 74 Pulse(bpm): 15 Weight(lbs): 180 Blood Pressure(mmHg): 119/56 Body Mass Index(BMI): 23 Temperature(F): 97.8 Respiratory 18 Rate(breaths/min): Photos: [1:No Photos] [2:No Photos] [N/A:N/A] Wound Location: [1:Left Lower Leg - Posterior Left Lower Leg - Anterior N/A] Wounding Event: [1:Trauma] [2:Blister] [N/A:N/A] Primary Etiology: [1:Venous Leg Ulcer] [2:Trauma, Other] [N/A:N/A] Comorbid History: [1:Cataracts, Sleep Apnea, Congestive Heart Failure, Congestive Heart Failure, Peripheral Venous Disease, Peripheral Venous Disease, Osteoarthritis, Neuropathy Osteoarthritis, Neuropathy] [2:Cataracts, Sleep Apnea,] [N/A:N/A] Date Acquired: [1:11/29/2018] [2:04/13/2019] [N/A:N/A] Weeks of Treatment: [1:13] [2:3] [N/A:N/A] Wound Status: [1:Healed - Epithelialized] [2:Healed - Epithelialized] [N/A:N/A] Measurements L x W x D 0x0x0 [2:0x0x0] [N/A:N/A] (cm) Area (cm) : [1:0] [2:0] [N/A:N/A] Volume (cm) : [1:0] [2:0] [N/A:N/A] % Reduction in  Area: [1:100.00%] [2:100.00%] [N/A:N/A] % Reduction in Volume: 100.00% [2:100.00%] [N/A:N/A] Classification: [1:Full Thickness Without Exposed Support Structures Exposed Support Structures] [2:Full Thickness Without] [N/A:N/A] Exudate Amount: [1:Small] [2:None Present] [N/A:N/A] Exudate Type: [1:Serosanguineous] [2:N/A] [N/A:N/A] Exudate Color: [1:red, brown] [2:N/A] [N/A:N/A] Wound Margin: [1:Flat and Intact] [2:Indistinct, nonvisible] [N/A:N/A] Granulation Amount: [1:None Present (0%)] [2:None Present (0%)] [N/A:N/A] Necrotic Amount: [1:None Present (0%)] [2:None Present (0%)] [N/A:N/A] Exposed Structures: [1:Fascia: No Fat Layer (Subcutaneous Fat Layer (Subcutaneous  Tissue) Exposed: No Tendon: No Muscle: No Joint: No Bone: No Large (67-100%)] [2:Fascia: No Tissue) Exposed: No Tendon: No Muscle: No Joint: No Bone: No Large (67-100%)] [N/A:N/A N/A] Treatment Notes Electronic Signature(s) Signed: 05/04/2019 5:45:04 PM By: Linton Ham MD Signed: 05/07/2019 6:12:41 PM By: Kela Millin Entered By: Linton Ham on 05/04/2019 16:51:35 -------------------------------------------------------------------------------- Multi-Disciplinary Care Plan Details Patient Name: Date of Service: TRAVIOUS, VANOVER 05/04/2019 2:45 PM Medical Record [id number] Patient Account Number: [id number] Date of Birth/Sex: Treating RN: 1940/11/22 (79 y.o. [name] Primary Care Margret Moat: Garret Reddish Other Clinician: Referring Michon Kaczmarek: Treating Dresean Beckel/Extender:Robson, Kandice Hams, Alexis Goodell in Treatment: 13 Active Inactive Electronic Signature(s) Signed: 05/07/2019 6:12:41 PM By: Kela Millin Entered By: Kela Millin on 05/04/2019 15:46:06 -------------------------------------------------------------------------------- Pain Assessment Details Patient Name: Date of Service: DAMARIEN, NYMAN 05/04/2019 2:45 PM Medical Record [id number] Patient Account Number:  [id number] Date of Birth/Sex: Treating RN: 01/24/1941 (79 y.o. Janyth Contes Primary Care Kelby Adell: Garret Reddish Other Clinician: Referring Zaydenn Balaguer: Treating Mekiah Wahler/Extender:Robson, Kandice Hams, Alexis Goodell in Treatment: 13 Active Problems Location of Pain Severity and Description of Pain Patient Has Paino No Site Locations Pain Management and Medication Current Pain Management: Electronic Signature(s) Signed: 05/07/2019 6:14:31 PM By: [name] RN, BSN Entered By: [name] on 05/04/2019 15:18:10 -------------------------------------------------------------------------------- Wound Assessment Details Patient Name: Date of Service: Carman, Essick Carlester 05/04/2019 2:45 PM Medical Record [id number] Patient Account Number: [id number] Date of Birth/Sex: Treating RN: 09-Apr-1940 (78 y.o. [name] Primary Care Treylen Gibbs: Garret Reddish Other Clinician: Referring Braylynn Ghan: Treating Shaunita Seney/Extender:Robson, Kandice Hams, Alexis Goodell in Treatment: 13 Wound Status Wound Number: 1 Primary Venous Leg Ulcer Etiology: Wound Location: Left Lower Leg - Posterior Wound Healed - Epithelialized Wounding Event: Trauma Status: Date Acquired: 11/29/2018 Comorbid Cataracts, Sleep Apnea, Congestive Heart Weeks Of Treatment: 13 History: Failure, Peripheral Venous Disease, Clustered Wound: No Osteoarthritis, Neuropathy Photos Wound Measurements Length: (cm) 0 % Reduct Width: (cm) 0 % Reduct Depth: (cm) 0 Epitheli Area: (cm) 0 Tunneli Volume: (cm) 0 Undermi Wound Description Full Thickness Without Exposed Support Foul Odo Classification: Structures Slough/F Wound Flat and Intact Margin: Exudate Small Amount: Exudate Serosanguineous Type: Exudate red, brown Color: Wound Bed Granulation Amount: None Present (0%) Necrotic Amount: None Present (0%) Fascia E Fat Laye Tendon E Muscle E Joint Ex Bone Exp r After Cleansing: No ibrino  No Exposed Structure xposed: No r (Subcutaneous Tissue) Exposed: No xposed: No xposed: No posed: No osed: No ion in Area: 100% ion in Volume: 100% alization: Large (67-100%) ng: No ning: No Electronic Signature(s) Signed: 05/07/2019 4:24:03 PM By: Mikeal Hawthorne EMT/HBOT Signed: 05/07/2019 6:12:41 PM By: Kela Millin Entered By: Mikeal Hawthorne on 05/07/2019 13:41:02 -------------------------------------------------------------------------------- Wound Assessment Details Patient Name: Date of Service: Jontrell, Bushong Tellis 05/04/2019 2:45 PM Medical Record [id number] Patient Account Number: [id number] Date of Birth/Sex: Treating RN: [birth year] (79 y.o. [name] Primary Care Daziah Hesler: Garret Reddish Other Clinician: Referring Pearlee Arvizu: Treating Michaelah Credeur/Extender:Robson, Kandice Hams, Alexis Goodell in Treatment: 13 Wound Status Wound Number: 2 Primary Trauma, Other Etiology: Wound Location: Left Lower Leg - Anterior Wound Healed - Epithelialized Wounding Event: Blister Status: Date Acquired: 04/13/2019 Comorbid Cataracts, Sleep Apnea, Congestive Heart Weeks Of Treatment: 3 History: Failure, Peripheral Venous Disease, Clustered Wound: No Osteoarthritis, Neuropathy Photos Wound Measurements Length: (cm) 0 % Reduct Width: (cm) 0 % Reduct Depth: (cm) 0 Epitheli Area: (cm) 0 Tunneli Volume: (cm) 0 Undermi Wound Description Classification: Full Thickness Without Exposed Support Foul Odo Structures Slough/F Wound Indistinct, nonvisible Margin: Exudate None Present Amount: Wound Bed Granulation Amount:  None Present (0%) Necrotic Amount: None Present (0%) Fascia E Fat Laye Tendon E Muscle E Joint Ex Bone Exp Electronic Signature(s) Signed: 05/07/2019 4:24:03 PM By: Mikeal Hawthorne EMT/HBOT Signed: 05/07/2019 6:12:41 PM By: Kela Millin Entered By: Mikeal Hawthorne on 02/15/ r After Cleansing: No ibrino No Exposed Structure xposed: No r  (Subcutaneous Tissue) Exposed: No xposed: No xposed: No posed: No osed: No 2021 13:40:43 ion in Area: 100% ion in Volume: 100% alization: Large (67-100%) ng: No ning: No -------------------------------------------------------------------------------- Vitals Details Patient Name: Date of Service: DEAVEN, URWIN 05/04/2019 2:45 PM Medical Record [id number] Patient Account Number: [id number] Date of Birth/Sex: Treating RN: December 20, 1940 (79 y.o. Janyth Contes Primary Care Matisyn Cabeza: Garret Reddish Other Clinician: Referring Carleena Mires: Treating Elisheva Fallas/Extender:Robson, Kandice Hams, Alexis Goodell in Treatment: 13 Vital Signs Time Taken: 15:18 Temperature (F): 97.8 Height (in): 74 Pulse (bpm): 84 Weight (lbs): 180 Respiratory Rate (breaths/min): 18 Body Mass Index (BMI): 23.1 Blood Pressure (mmHg): 119/56 Reference Range: 80 - 120 mg / dl Electronic Signature(s) Signed: 05/07/2019 6:14:31 PM By: [name] RN, BSN Entered By: [name] on 05/04/2019 15:19:40

## 2019-05-07 NOTE — Progress Notes (Signed)
Jashua, Knaak Jehu ([id number]) Visit Report for 05/04/2019 HPI Details Patient Name: Date of Service: TYREEK, CLABO 05/04/2019 2:45 PM Medical Record [id number] Patient Account Number: [id number] Date of Birth/Sex: Treating RN: 06/20/1940 (79 y.o. [name] Primary Care Provider: Garret Reddish Other Clinician: Referring Provider: Treating Provider/Extender:Trichelle Lehan, Kandice Hams, Alexis Goodell in Treatment: 13 History of Present Illness HPI Description: 01/31/2019 on evaluation today patient actually appears to be doing poorly unfortunately secondary to an injury that occurred when he hit his leg on a car door September 9. He was seen in the ER where he had 13 sutures placed. His primary care provider remove those 2 weeks later and unfortunately the wound began to dehisce following. It appears that he may have had a skin flap that just did not take and survive. Nonetheless he was stated to have had Keflex given from the ER though the patient states he does not even remember that. He also is stated to have taken 2 rounds of doxycycline by his primary care provider but again the patient states he only remembers the most recent antibiotic which was actually the Augmentin then switched to Bactrim based on the culture results. The culture showed that he had Morganella Morgagni noted in the culture. Subsequently when he was taking the Bactrim he states that the redness got much better but then unfortunately worsened again once he came off of the medication. Apparently the patient was given triamcinolone ointment to put on the wound although obviously that really did not do much for the wound and again the patient tells me that he was "given the wrong ointment". Again I am not exactly sure what was going on in that scenario 1 where another. Either way I do believe that he has necrotic tissue on the surface of the wound this can require some debridement if he is able to tolerate  it and then subsequently he is also get a need likely a refill of the Bactrim in order to help treat the infection I do not believe this is completely cleared up at this point. I also think that he really needs to have edema control again he is very swollen and I believe that Performing a compression wrap will also be of great benefit for him. The patient does have chronic kidney disease stage IV along with peripheral neuropathy secondary to issues with his back. 02/07/2019 on evaluation today patient appears to be doing a little better with regard to the overall appearance of the wound although it appears to be very dry. Fortunately there is no signs of active infection at this time. No fever chills noted. I really feel like that the patient may be having issues at this point with the Iodoflex being too absorptive for him at this point which is causing this to dry out. I think we may want to try something different to see if that would be of benefit for him. 02/14/2019 on evaluation today patient actually appears to be doing much better with regard to his wound at this time. He has been tolerating the dressing changes without complication. Fortunately everything appears to be doing much better from the standpoint of how soft the dead tissue is today in fact I was able to debride away some of the necrotic tissue because it was doing so well. Fortunately he is showing signs of good improvement as of this week. 02/21/2019 on evaluation today patient feels like he is actually doing a little bit worse noticing the redness on his lower extremity.  I agree I have not seen this before and I am concerned about the possibility of infection. I think we may want to initiate something such as doxycycline and possibly wound culture today to see what exactly may be going on here. 02/28/19 evaluation today patient appears to be doing better with regard to his wound. Hes been tolerating the dressing changes without  complication. Fortunately theres no signs of active infection at this time. Overall I feel like the doxycycline has also been helpful for him. 03/07/2019 on evaluation today patient appears to be doing very well with regard to his lower extremity ulcer which is showing excellent improvement. Fortunately there is no evidence of active infection which is good news. No fevers, chills, nausea, vomiting, or diarrhea. Overall I feel like he is making excellent progress towards closure. 12/28; since the patient was last here he fell and apparently fractured his shoulder in 2 places. He is seeing orthopedics. Kept his wrap on for 2 weeks. He has a venous insufficiency wound on the posterior left calf. We have been using silver collagen under 3 layer compression 03/30/2019. Patient stopped taking his torsemide because he was going to the bathroom frequently which was difficult because of his fractured shoulder. He was apparently admitted to hospital for congestive heart failure. He tells [place] that they changed his compression on the left leg twice. His wound is measuring smaller. We have been using silver collagen 1/15; in general his leg looks better today. Still a necrotic surface on the posterior left leg. He has severe chronic venous insufficiency. He did bring in his stockings they look like they are 20/30 below-knee. I have encouraged them to use this on the right leg which has severe wound threatening edema 1/22; patient's edema control is under better control. He has an open area on the left posterior calf that is superficial but seems to be resistant going towards closure. He had a wrap injury on the left anterior upper tibia area today. 1/29; still moderate edema control. Left posterior calf wound appears to be contracting. He has the wrap injury on the left anterior upper tibia. We have been using Hydrofera Blue 2/5; we have him in 3 layer compression. Still moderate edema control. The left anterior  wound is closed left posterior wound continues to be contracting. We are using Hydrofera Blue 2/12; we have him in 3 layer compression. His wounds are closed. He did not bring in a stocking. He has what appears to be some form of support stocking on the right leg that is what he says he is going to use on the left. Everything is under control. Electronic Signature(s) Signed: 05/04/2019 5:45:04 PM By: Linton Ham MD Entered By: Linton Ham on 05/04/2019 16:52:35 -------------------------------------------------------------------------------- Physical Exam Details Patient Name: Date of Service: JOEANTHONY, SEELING 05/04/2019 2:45 PM Medical Record [id number] Patient Account Number: [id number] Date of Birth/Sex: Treating RN: March 07, 1941 (78 y.o. [name] Primary Care Provider: Garret Reddish Other Clinician: Referring Provider: Treating Provider/Extender:Antonio Creswell, Kandice Hams, Alexis Goodell in Treatment: 13 Constitutional Sitting or standing Blood Pressure is within target range for patient.. Pulse regular and within target range for patient.Marland Kitchen Respirations regular, non-labored and within target range.Marland Kitchen Appears in no distress. Cardiovascular It will pulses are palpable. Edema present in both extremities. Changes of chronic venous insufficiency. Notes Exam; everything is closed today. There is no evidence of surrounding infection. His edema control is moderate Electronic Signature(s) Signed: 05/04/2019 5:45:04 PM By: Linton Ham MD Entered By: Linton Ham on 05/04/2019  16:53:18 -------------------------------------------------------------------------------- Physician Orders Details Patient Name: Date of Service: JONN, CHAIKIN 05/04/2019 2:45 PM Medical Record [id number] Patient Account Number: [id number] Date of Birth/Sex: Treating RN: 02-06-1941 (79 y.o. [name] Primary Care Provider: Garret Reddish Other Clinician: Referring  Provider: Treating Provider/Extender:Indiyah Paone, Kandice Hams, Alexis Goodell in Treatment: 13 Verbal / Phone Orders: No Diagnosis Coding ICD-10 Coding Code Description I87.2 Venous insufficiency (chronic) (peripheral) S81.802A Unspecified open wound, left lower leg, initial encounter L97.822 Non-pressure chronic ulcer of other part of left lower leg with fat layer exposed N18.4 Chronic kidney disease, stage 4 (severe) G90.09 Other idiopathic peripheral autonomic neuropathy Discharge From [facility] Services Discharge from [place] - call if wound re-opens Skin Barriers/Peri-Wound Care Moisturizing lotion - at night, bilateral legs Edema Control Avoid standing for long periods of time Elevate legs to the level of the heart or above for 30 minutes daily and/or when sitting, a frequency of: Exercise regularly Support Garment 20-30 mm/Hg pressure to: - to bilateral lower legs. place on first thing in the morning and remove at night before bed Electronic Signature(s) Signed: 05/04/2019 5:45:04 PM By: Linton Ham MD Signed: 05/07/2019 6:12:41 PM By: Kela Millin Entered By: Kela Millin on 05/04/2019 15:48:34 -------------------------------------------------------------------------------- Problem List Details Patient Name: Date of Service: Wenceslaus, Gist Huey 05/04/2019 2:45 PM Medical Record [id number] Patient Account Number: [id number] Date of Birth/Sex: Treating RN: [birth year] (79 y.o. [name] Primary Care Provider: Garret Reddish Other Clinician: Referring Provider: Treating Provider/Extender:Rayjon Wery, Kandice Hams, Alexis Goodell in Treatment: 13 Active Problems ICD-10 Evaluated Encounter Code Description Active Date Today Diagnosis I87.2 Venous insufficiency (chronic) (peripheral) 01/31/2019 No Yes S81.802A Unspecified open wound, left lower leg, initial 01/31/2019 No Yes encounter L97.822 Non-pressure chronic ulcer of other part of left lower  01/31/2019 No Yes leg with fat layer exposed N18.4 Chronic kidney disease, stage 4 (severe) 01/31/2019 No Yes G90.09 Other idiopathic peripheral autonomic neuropathy 01/31/2019 No Yes Inactive Problems Resolved Problems Electronic Signature(s) Signed: 05/04/2019 5:45:04 PM By: Linton Ham MD Entered By: Linton Ham on 05/04/2019 16:51:27 -------------------------------------------------------------------------------- Progress Note Details Patient Name: Date of Service: [name] Keiyon 05/04/2019 2:45 PM Medical Record [id number] Patient Account Number: [id number] Date of Birth/Sex: Treating RN: 05/08/1940 (79 y.o. [name] Primary Care Provider: Garret Reddish Other Clinician: Referring Provider: Treating Provider/Extender:Shatina Streets, Kandice Hams, Alexis Goodell in Treatment: 13 Subjective History of Present Illness (HPI) 01/31/2019 on evaluation today patient actually appears to be doing poorly unfortunately secondary to an injury that occurred when he hit his leg on a car door September 9. He was seen in the ER where he had 13 sutures placed. His primary care provider remove those 2 weeks later and unfortunately the wound began to dehisce following. It appears that he may have had a skin flap that just did not take and survive. Nonetheless he was stated to have had Keflex given from the ER though the patient states he does not even remember that. He also is stated to have taken 2 rounds of doxycycline by his primary care provider but again the patient states he only remembers the most recent antibiotic which was actually the Augmentin then switched to Bactrim based on the culture results. The culture showed that he had Morganella Morgagni noted in the culture. Subsequently when he was taking the Bactrim he states that the redness got much better but then unfortunately worsened again once he came off of the medication. Apparently the patient was given  triamcinolone ointment to put on the wound although obviously that really did  not do much for the wound and again the patient tells me that he was "given the wrong ointment". Again I am not exactly sure what was going on in that scenario 1 where another. Either way I do believe that he has necrotic tissue on the surface of the wound this can require some debridement if he is able to tolerate it and then subsequently he is also get a need likely a refill of the Bactrim in order to help treat the infection I do not believe this is completely cleared up at this point. I also think that he really needs to have edema control again he is very swollen and I believe that Performing a compression wrap will also be of great benefit for him. The patient does have chronic kidney disease stage IV along with peripheral neuropathy secondary to issues with his back. 02/07/2019 on evaluation today patient appears to be doing a little better with regard to the overall appearance of the wound although it appears to be very dry. Fortunately there is no signs of active infection at this time. No fever chills noted. I really feel like that the patient may be having issues at this point with the Iodoflex being too absorptive for him at this point which is causing this to dry out. I think we may want to try something different to see if that would be of benefit for him. 02/14/2019 on evaluation today patient actually appears to be doing much better with regard to his wound at this time. He has been tolerating the dressing changes without complication. Fortunately everything appears to be doing much better from the standpoint of how soft the dead tissue is today in fact I was able to debride away some of the necrotic tissue because it was doing so well. Fortunately he is showing signs of good improvement as of this week. 02/21/2019 on evaluation today patient feels like he is actually doing a little bit worse noticing the  redness on his lower extremity. I agree I have not seen this before and I am concerned about the possibility of infection. I think we may want to initiate something such as doxycycline and possibly wound culture today to see what exactly may be going on here. 02/28/19 evaluation today patient appears to be doing better with regard to his wound. Heoos been tolerating the dressing changes without complication. Fortunately thereoos no signs of active infection at this time. Overall I feel like the doxycycline has also been helpful for him. 03/07/2019 on evaluation today patient appears to be doing very well with regard to his lower extremity ulcer which is showing excellent improvement. Fortunately there is no evidence of active infection which is good news. No fevers, chills, nausea, vomiting, or diarrhea. Overall I feel like he is making excellent progress towards closure. 12/28; since the patient was last here he fell and apparently fractured his shoulder in 2 places. He is seeing orthopedics. Kept his wrap on for 2 weeks. He has a venous insufficiency wound on the posterior left calf. We have been using silver collagen under 3 layer compression 03/30/2019. Patient stopped taking his torsemide because he was going to the bathroom frequently which was difficult because of his fractured shoulder. He was apparently admitted to hospital for congestive heart failure. He tells [place] that they changed his compression on the left leg twice. His wound is measuring smaller. We have been using silver collagen 1/15; in general his leg looks better today. Still a necrotic surface  on the posterior left leg. He has severe chronic venous insufficiency. He did bring in his stockings they look like they are 20/30 below-knee. I have encouraged them to use this on the right leg which has severe wound threatening edema 1/22; patient's edema control is under better control. He has an open area on the left posterior calf  that is superficial but seems to be resistant going towards closure. He had a wrap injury on the left anterior upper tibia area today. 1/29; still moderate edema control. Left posterior calf wound appears to be contracting. He has the wrap injury on the left anterior upper tibia. We have been using Hydrofera Blue 2/5; we have him in 3 layer compression. Still moderate edema control. The left anterior wound is closed left posterior wound continues to be contracting. We are using Hydrofera Blue 2/12; we have him in 3 layer compression. His wounds are closed. He did not bring in a stocking. He has what appears to be some form of support stocking on the right leg that is what he says he is going to use on the left. Everything is under control. Objective Constitutional Sitting or standing Blood Pressure is within target range for patient.. Pulse regular and within target range for patient.Marland Kitchen Respirations regular, non-labored and within target range.Marland Kitchen Appears in no distress. Vitals Time Taken: 3:18 PM, Height: 74 in, Weight: 180 lbs, BMI: 23.1, Temperature: 97.8 F, Pulse: 84 bpm, Respiratory Rate: 18 breaths/min, Blood Pressure: 119/56 mmHg. Cardiovascular It will pulses are palpable. Edema present in both extremities. Changes of chronic venous insufficiency. General Notes: Exam; everything is closed today. There is no evidence of surrounding infection. His edema control is moderate Integumentary (Hair, Skin) Wound #1 status is Healed - Epithelialized. Original cause of wound was Trauma. The wound is located on the Left,Posterior Lower Leg. The wound measures 0cm length x 0cm width x 0cm depth; 0cm^2 area and 0cm^3 volume. There is no tunneling or undermining noted. There is a small amount of serosanguineous drainage noted. The wound margin is flat and intact. There is no granulation within the wound bed. There is no necrotic tissue within the wound bed. Wound #2 status is Healed -  Epithelialized. Original cause of wound was Blister. The wound is located on the Left,Anterior Lower Leg. The wound measures 0cm length x 0cm width x 0cm depth; 0cm^2 area and 0cm^3 volume. There is no tunneling or undermining noted. There is a none present amount of drainage noted. The wound margin is indistinct and nonvisible. There is no granulation within the wound bed. There is no necrotic tissue within the wound bed. Assessment Active Problems ICD-10 Venous insufficiency (chronic) (peripheral) Unspecified open wound, left lower leg, initial encounter Non-pressure chronic ulcer of other part of left lower leg with fat layer exposed Chronic kidney disease, stage 4 (severe) Other idiopathic peripheral autonomic neuropathy Plan Discharge From [facility] Services: Discharge from [place] - call if wound re-opens Skin Barriers/Peri-Wound Care: Moisturizing lotion - at night, bilateral legs Edema Control: Avoid standing for long periods of time Elevate legs to the level of the heart or above for 30 minutes daily and/or when sitting, a frequency of: Exercise regularly Support Garment 20-30 mm/Hg pressure to: - to bilateral lower legs. place on first thing in the morning and remove at night before bed 1. I am discharging the patient into his own stockings 2. I am not really sure what these are however I got into a point of he felt they were  adequate I did not and I am just going to wait and see what happens my suspicion is that this will not last very long 3. Asked him to lubricate his skin. 4. If he returns in short order he will need more aggressive compression than what he is wearing we actually had made this clear to him previously Electronic Signature(s) Signed: 05/04/2019 5:45:04 PM By: Linton Ham MD Entered By: Linton Ham on 05/04/2019 16:54:29 -------------------------------------------------------------------------------- SuperBill Details Patient Name: Date of  Service: Kenji, Mapel Ansh 05/04/2019 Medical Record [id number] Patient Account Number: [id number] Date of Birth/Sex: Treating RN: 1940-10-04 (79 y.o. [name] Primary Care Provider: Garret Reddish Other Clinician: Referring Provider: Treating Provider/Extender:Riane Rung, Kandice Hams, Alexis Goodell in Treatment: 13 Diagnosis Coding ICD-10 Codes Code Description I87.2 Venous insufficiency (chronic) (peripheral) S81.802A Unspecified open wound, left lower leg, initial encounter L97.822 Non-pressure chronic ulcer of other part of left lower leg with fat layer exposed N18.4 Chronic kidney disease, stage 4 (severe) G90.09 Other idiopathic peripheral autonomic neuropathy Facility Procedures CPT4 Code: [id number] Description: 99213 - WOUND CARE VISIT-LEV 3 EST PT Modifier: Quantity: 1 Physician Procedures CPT4 Code Description: [id number] - WC PHYS LEVEL 3 - EST PT ICD-10 Diagnosis Description L97.822 Non-pressure chronic ulcer of other part of left lower leg I87.2 Venous insufficiency (chronic) (peripheral) Modifier: with fat layer ex Quantity: 1 posed Electronic Signature(s) Signed: 05/04/2019 5:45:04 PM By: Linton Ham MD Entered By: Linton Ham on 05/04/2019 16:54:49

## 2019-05-09 DIAGNOSIS — I130 Hypertensive heart and chronic kidney disease with heart failure and stage 1 through stage 4 chronic kidney disease, or unspecified chronic kidney disease: Secondary | ICD-10-CM | POA: Diagnosis not present

## 2019-05-09 DIAGNOSIS — I5033 Acute on chronic diastolic (congestive) heart failure: Secondary | ICD-10-CM | POA: Diagnosis not present

## 2019-05-09 DIAGNOSIS — M1030 Gout due to renal impairment, unspecified site: Secondary | ICD-10-CM | POA: Diagnosis not present

## 2019-05-09 DIAGNOSIS — D631 Anemia in chronic kidney disease: Secondary | ICD-10-CM | POA: Diagnosis not present

## 2019-05-09 DIAGNOSIS — N184 Chronic kidney disease, stage 4 (severe): Secondary | ICD-10-CM | POA: Diagnosis not present

## 2019-05-11 ENCOUNTER — Encounter (HOSPITAL_BASED_OUTPATIENT_CLINIC_OR_DEPARTMENT_OTHER): Payer: Medicare Other | Admitting: Internal Medicine

## 2019-05-14 ENCOUNTER — Other Ambulatory Visit: Payer: Self-pay

## 2019-05-15 ENCOUNTER — Ambulatory Visit (INDEPENDENT_AMBULATORY_CARE_PROVIDER_SITE_OTHER): Payer: Medicare Other | Admitting: Family Medicine

## 2019-05-15 ENCOUNTER — Encounter: Payer: Self-pay | Admitting: Family Medicine

## 2019-05-15 VITALS — BP 120/70 | HR 108 | Temp 98.1°F | Ht 62.0 in | Wt 184.0 lb

## 2019-05-15 DIAGNOSIS — D692 Other nonthrombocytopenic purpura: Secondary | ICD-10-CM

## 2019-05-15 DIAGNOSIS — N184 Chronic kidney disease, stage 4 (severe): Secondary | ICD-10-CM

## 2019-05-15 DIAGNOSIS — M7022 Olecranon bursitis, left elbow: Secondary | ICD-10-CM | POA: Diagnosis not present

## 2019-05-15 NOTE — Patient Instructions (Addendum)
This is olecranon bursitis. It is not painful so I dont strongly think this is gout. Lets have you try to avoid hititng the elbow while also trying to ice this regularly. I think it will get better with time- if not making substantial improvement within a month or symptoms worsen- lets get you into orthopedics- Dr. Berenice Primas (you can call or call us if you ned a referral )   See sports medicine handout. Please avoid ibuprofen/aleve/motrin type products to protect your kidneys- you are spot on tylenol only.

## 2019-05-15 NOTE — Progress Notes (Signed)
Phone [phone] In person visit   Subjective:   [name] is a 79 y.o. year old very pleasant male patient who presents for/with See problem oriented charting Chief Complaint  Patient presents with  . Follow-up  . blister on elbow   This visit occurred during the SARS-CoV-2 public health emergency.  Safety protocols were in place, including screening questions prior to the visit, additional usage of staff PPE, and extensive cleaning of exam room while observing appropriate contact time as indicated for disinfecting solutions.   Past Medical History-  Patient Active Problem List   Diagnosis Date Noted  . Chronic diastolic CHF (congestive heart failure) ([place]) 11/30/2017    Priority: High  . Mild depressed bipolar I disorder ([place]) 09/21/2006    Priority: High  . Chronic low back pain with degenerative lumbar spinal stenosis-minimal improvement with surgery late 2019 03/08/2017    Priority: Medium  . Gout 07/07/2016    Priority: Medium  . Fatty liver 10/09/2014    Priority: Medium  . OSA (obstructive sleep apnea) 10/30/2008    Priority: Medium  . Osteoporosis 09/25/2008    Priority: Medium  . CKD (chronic kidney disease), stage IV ([place]) 03/01/2008    Priority: Medium  . History of fracture of left hip 09/01/2017    Priority: Low  . Senile purpura ([place]) 06/22/2017    Priority: Low  . Osteoarthritis of spine 03/21/2017    Priority: Low  . Hypersomnolence 08/20/2016    Priority: Low  . Other constipation 01/13/2016    Priority: Low  . Primary osteoarthritis of right knee 12/08/2015    Priority: Low  . Gallstones 10/09/2014    Priority: Low  . Bilateral lower extremity edema 09/25/2014    Priority: Low  . Chronic venous insufficiency 08/29/2013    Priority: Low  . Right shoulder pain 08/29/2013    Priority: Low  . Neuropathy 01/20/2011    Priority: Low  . Acute CHF (congestive heart failure) ([place]) 03/24/2019  . Anemia due to stage 4 chronic kidney disease  ([place]) 03/24/2019  . Wound of left lower extremity 03/24/2019  . Weakness 03/24/2019  . Paroxysmal tachycardia ([place]) 12/05/2018  . Spondylolisthesis of lumbar region 03/10/2018  . Leukocytosis 09/04/2017  . Proximal humerus fracture 05/22/2013    Medications- reviewed and updated Current Outpatient Medications  Medication Sig Dispense Refill  . acetaminophen (TYLENOL 8 HOUR) 650 MG CR tablet Take 1,300 mg by mouth 2 (two) times daily.     Marland Kitchen amoxicillin (AMOXIL) 500 MG capsule Take 2,000 mg by mouth daily as needed (dental procedure).     . ARIPiprazole (ABILIFY) 5 MG tablet Take 1 tablet (5 mg total) by mouth daily. (Patient taking differently: Take 2.5 mg by mouth daily. ) 90 tablet 3  . b complex vitamins tablet Take 1 tablet by mouth daily.    . Calcium-Vitamin D (CALTRATE 600 PLUS-VIT D PO) Take 1 tablet by mouth daily.     . L-methylfolate Calcium 15 MG TABS Take 15 mg by mouth daily.    Marland Kitchen torsemide (DEMADEX) 20 MG tablet Take 1 tablet (20 mg total) by mouth daily. 90 tablet 0  . tranylcypromine (PARNATE) 10 MG tablet Take 30 mg by mouth 2 (two) times daily.      No current facility-administered medications for this visit.     Objective:  BP 120/70   Pulse (!) 108   Temp 98.1 F (36.7 C)   Ht 5\' 2"  (1.575 m)   Wt 184 lb (83.5 kg)  SpO2 98%   BMI 33.65 kg/m  Gen: NAD, resting comfortably Noted elevated heart rate after examination-we will recheck at next visit Ext: 1+ edema under compression stockings.  On left elbow has 3 x 3 cm raised probable area of left olecranon bursa.  No pain with palpation of area Skin: warm, dry    Assessment and Plan  Blister on Elbow/fall S: Patient reports he was coming down off of a curb and did not note the slant of the neck step off the curb-he ended up falling forward primarily on his left elbow Thursday of last week.  He started noting swelling the next day over the left elbow.  And he denies pain but does have significant swelling  without redness.  This is not painful like prior gout episodes A/P: Patient with left olecranon bursitis-swollen at least 3 x 3 cm.  NSAIDs are contraindicated due to his CKD stage IV.  With history of diastolic CHF and significant edema in his legs I also did not think prednisone was the best choice-I do have him a handout from sports medicine advisor and also recommended conservative care with Ace wrap-if symptoms not improving within a month could refer to orthopedics as he already sees Dr. Berenice Primas or sports medicine.  #Senile purpura-patient continues to report easy bruising/bleeding reports and did not have significant issues with this after his fall  #Osteoporosis-patient reports he has upcoming visit with [facility]-prior review from [place] endocrine suggested referral to Dr. Edmonia James at [facility] due to CKD stage IV as well as osteoporosis   #CKD stage IV S: Follows with [facility].  Remains on torsemide 20 mg daily.  Knows to avoid NSAIDs. Lab Results  Component Value Date   CREATININE 2.49 (H) 04/03/2019  A/P: Largely stable on last check with creatinine around 2.5 with baseline largely in 2.4-2.6 range and GFR in high 20s.  As above we have to avoid NSAIDs for olecranon bursitis due to CKD stage IV  Recommended follow up: Has a regularly scheduled appointment in May-happy to see him sooner if needed Future Appointments  Date Time Provider [place]  05/21/2019  3:40 PM Sueanne Margarita, MD [facility]  08/02/2019  1:20 PM [name], MD [facility]    Lab/Order associations:   ICD-10-CM   1. Olecranon bursitis, left elbow  M70.22   2. Senile purpura (HCC)  D69.2   3. CKD (chronic kidney disease), stage IV ([place])  N18.4    Return precautions advised.  Garret Reddish, MD

## 2019-05-21 ENCOUNTER — Telehealth: Payer: Self-pay

## 2019-05-21 ENCOUNTER — Ambulatory Visit: Payer: Medicare Other | Admitting: Cardiology

## 2019-05-21 DIAGNOSIS — G4733 Obstructive sleep apnea (adult) (pediatric): Secondary | ICD-10-CM | POA: Diagnosis not present

## 2019-05-21 NOTE — Progress Notes (Deleted)
Cardiology Office Note:    Date:  05/21/2019   ID:  [name], DOB [birth year], MRN [id number]  PCP:  [name], MD  Cardiologist:  Fransico Him, MD    Referring MD: [name], MD   No chief complaint on file.   History of Present Illness:    [name] is a 79 y.o. male with a hx of chronic diastolic CHF, chronic kidney disease stage III, hypertension bipolar disorder,chronic lower extremity edema on torsemide and chronic low back pain.  He was visiting his daughter in [place] in June and fell and fractured his hip. He underwent surgical repair which was complicated by respiratory failure requiring BiPAP as well as acute on CKD stage 3 and post op ileus. He was given a dx of acute diastolic CHF due to findings of diastolic dysfunction on echo but was felt to have resulted due to volume resuscitation with liters of fluid. Cardiology evaluated him during hospital stay and felt that diastolic CHF was a wrong dx. He went to rehab and had problems with severe LE edema requiring leg wraps. It was felt that his LE edema was likely related to chronic venous stasis, hypoalbuminea, sedentary state and constantly sitting with legs hanging down. Cxrays showed atelectasis with no effusions or edema.  Due to SOB and ongoing LE edema he was referred to another Cardiologist in [place] for a second opinion. In review of the records apparently he denied having any SOB but his daughter informed the Cardiologist that his breathing had been terrible. Echo 10/13/2017 showed small pericardial effusion and normal LVF with EF 70% and normal RVF with dilated non-collapsible IVC. His BNP was only 17. Due to inconsistency between data and sx he underwent right heart cath which showed normal filling pressures on 11/15/2017 (CVP was 59mmHg, normal PAP 36/95mmHg with mean 47mmHg, PCWP 40mmHg and CO 5.8L/min). It was felt that his SOB and LE edema were unlikely to be due to a cardiac  etiology. He was discharged home and set up to see Pulmonary. His creatinine bumped from baseline of 2.5 to 2.8 and diuretics were held and creatinine dropped back to 2.6 and he was discharged on Torsemide 20mg  daily and was instructed to followup with his nephrologist in [place]. LE venous dopplers were negative for DVT.  When he was last seen by Ren[facility] he wasstill complaining of lower extremity edema although his weight was down. [facility] and he was instructed to continue current Torsemide 20mg  daily.  He is here today for followup and is doing well.  He denies any chest pain or pressure, SOB, DOE, PND, orthopnea, LE edema, dizziness, palpitations or syncope. He is compliant with his meds and is tolerating meds with no SE.    Past Medical History:  Diagnosis Date  . Arthritis    back  . Back pain    reason unknown  . Benign prostatic hyperplasia (BPH) with urinary urge incontinence    sees Dr. Jeffie Pollock   . Bipolar 1 disorder [facility])    sees Dr. Norma Fredrickson  . Chronic diastolic CHF (congestive heart failure) ([place]) 11/30/2017   right Heart cath in [facility] 10/2017 showed normal filling pressures. 2D echo with EF 55-60%, grade 1 DD, MIld RVE with normal RVF (normal RV size with followup echo 09/09/2017), trivial MR/PR, mild TR  . CKD (chronic kidney disease)    stage 3 followed by [facility]  . Depression    takes Parnate daily  . Diverticulitis of colon  05/27/2015  . Essential hypertension 04/11/2007  . Headache(784.0)    occasionally  . Hip fracture ([place]) 09/01/2017   left hip fracture  . History of colon polyps   . Hypothyroidism 10/03/2009   Noted by Dr. Arnoldo Morale- patient states never on medicine- may have been checked due to parnate and abilify   . 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 Joint pain   . Joint swelling   . Osteoporosis    takes Drisdol weekly and Caltrate daily  . Peripheral edema     takes torsemide daily followed by nephrology  . Shingles 05/27/2015  . Sleep apnea    wears c-pap    Past Surgical History:  Procedure Laterality Date  . BACK SURGERY    . CARDIAC CATHETERIZATION    . COLONOSCOPY W/ POLYPECTOMY  03-10-15   per Dr. Havery Moros, adenomatous polyps, repeat in 3 yrs   . HIP FRACTURE SURGERY Left 09/02/2017  . REVERSE SHOULDER ARTHROPLASTY Right 05/22/2013   Procedure: REVERSE SHOULDER ARTHROPLASTY;  Surgeon: Nita Sells, MD;  Location: [facility];  Service: Orthopedics;  Laterality: Right;  Right reverse total shoulder  . TONSILLECTOMY    . TOTAL KNEE ARTHROPLASTY Right 12/08/2015   Procedure: TOTAL KNEE ARTHROPLASTY;  Surgeon: Dorna Leitz, MD;  Location: [facility];  Service: Orthopedics;  Laterality: Right;    Current Medications: No outpatient medications have been marked as taking for the 05/21/19 encounter (Appointment) with Sueanne Margarita, MD.     Allergies:   Sulfa antibiotics   Social History   Socioeconomic History  . Marital status: Divorced    Spouse name: Not on file  . Number of children: Not on file  . Years of education: Not on file  . Highest education level: Not on file  Occupational History  . Not on file  Tobacco Use  . Smoking status: Never Smoker  . Smokeless tobacco: Never Used  Substance and Sexual Activity  . Alcohol use: No    Alcohol/week: 0.0 standard drinks  . Drug use: No  . Sexual activity: Yes  Other Topics Concern  . Not on file  Social History Narrative   GF Luther Redo. 2 sons, 1 daughter from prior relationship      Retired from Air traffic controller business in [place]: movies, time around [employer], out for dinner   Social Determinants of Radio broadcast assistant Strain:   . Difficulty of Paying Living Expenses: Not on file  Food Insecurity:   . Worried About Charity fundraiser in the Last Year: Not on file  . Ran Out of Food in the Last Year: Not on file  Transportation Needs:   .  Lack of Transportation (Medical): Not on file  . Lack of Transportation (Non-Medical): Not on file  Physical Activity:   . Days of Exercise per Week: Not on file  . Minutes of Exercise per Session: Not on file  Stress:   . Feeling of Stress : Not on file  Social Connections:   . Frequency of Communication with Friends and Family: Not on file  . Frequency of Social Gatherings with Friends and Family: Not on file  . Attends Religious Services: Not on file  . Active Member of Clubs or Organizations: Not on file  . Attends Archivist Meetings: Not on file  . Marital Status: Not on file     Family History:  The patient's family history includes Appendicitis in his paternal grandfather; Cancer in his maternal grandmother and mother; Prostate cancer in his brother; Stroke in his father. There is no history of Colon cancer, Esophageal cancer, Rectal cancer, Stomach cancer, or Colon polyps.  ROS:   Please see the history of present illness.    ROS  All other systems reviewed and negative.   EKGs/Labs/Other Studies Reviewed:    The following studies were reviewed today: none  EKG:  EKG is  ordered today.  The ekg ordered today demonstrates ***  Recent Labs: 01/29/2019: NT-Pro BNP 103 03/24/2019: B Natriuretic Peptide 46.0 03/26/2019: Magnesium 2.3 04/03/2019: ALT 9; BUN 65; Creatinine, Ser 2.49; Hemoglobin 10.6; Platelets 347.0; Potassium 3.8; Sodium 139; TSH 2.26   Recent Lipid Panel    Component Value Date/Time   CHOL 140 03/14/2019 1151   TRIG 71.0 03/14/2019 1151   TRIG 58 03/23/2006 1139   HDL 57.10 03/14/2019 1151   CHOLHDL 2 03/14/2019 1151   VLDL 14.2 03/14/2019 1151   LDLCALC 68 03/14/2019 1151    Physical Exam:    VS:  There were no vitals taken for this visit.    Wt Readings from Last 3 Encounters:  05/15/19 184 lb (83.5 kg)  04/03/19 181 lb 9.6 oz (82.4 kg)  03/28/19 181 lb 1.6 oz (82.1 kg)     GEN:  Well nourished, well developed in no acute  distress HEENT: Normal NECK: No JVD; No carotid bruits LYMPHATICS: No lymphadenopathy CARDIAC: RRR, no murmurs, rubs, gallops RESPIRATORY:  Clear to auscultation without rales, wheezing or rhonchi  ABDOMEN: Soft, non-tender, non-distended MUSCULOSKELETAL:  No edema; No deformity  SKIN: Warm and dry NEUROLOGIC:  Alert and oriented x 3 PSYCHIATRIC:  Normal affect   ASSESSMENT:    No diagnosis found. PLAN:    In order of problems listed above:  1. Chronic venous insufficiency  -LE venous dopplers were negatirve in [place].  -Right heart cath was completely normal with normal right and left sided filling pressures.  -Echo with normal LVF.  -It was felt that LE edema not cardiac in etiology -He hangs his legs down when sitting and has CKD dz which is likely playing a role as well exacerbating his LE venous insufficiency.  -diuretics managed by nephrology  2. Chronic kidney disease stage III -[facility]   3. Abnormal EKG -EKG has demonstrated inferolateral ST abnormality. -He had a stress echo in [place] 10/07/2017  -lexiscan myoview 02/2018 with no ischemia - done for preop eval  4. Tachycardia  -? Etiology. Hbg 12.3 on 11/2017.  -TSH normal 11/29/2017. holter with average HR in 80's -since 2019 has resolved    Medication Adjustments/Labs and Tests Ordered: Current medicines are reviewed at length with the patient today.  Concerns regarding medicines are outlined above.  No orders of the defined types were placed in this encounter.  No orders of the defined types were placed in this encounter.   Signed, Fransico Him, MD  05/21/2019 10:19 AM    [place]

## 2019-05-21 NOTE — Telephone Encounter (Signed)
Spoke with patient to reschedule appointment until we received office visit notes from [facility]. Offered patient a virtual visit sooner but he refused so scheduled and office visit for 03/30.

## 2019-05-24 ENCOUNTER — Ambulatory Visit: Payer: Medicare Other | Admitting: Cardiology

## 2019-05-24 ENCOUNTER — Telehealth: Payer: Self-pay | Admitting: Cardiology

## 2019-05-24 NOTE — Telephone Encounter (Signed)
Medical records requested from [facility]. 05/24/19 vlm

## 2019-06-04 DIAGNOSIS — Z9889 Other specified postprocedural states: Secondary | ICD-10-CM | POA: Diagnosis not present

## 2019-06-04 DIAGNOSIS — S42295D Other nondisplaced fracture of upper end of left humerus, subsequent encounter for fracture with routine healing: Secondary | ICD-10-CM | POA: Diagnosis not present

## 2019-06-04 DIAGNOSIS — M25522 Pain in left elbow: Secondary | ICD-10-CM | POA: Diagnosis not present

## 2019-06-12 DIAGNOSIS — L728 Other follicular cysts of the skin and subcutaneous tissue: Secondary | ICD-10-CM | POA: Diagnosis not present

## 2019-06-19 ENCOUNTER — Encounter: Payer: Self-pay | Admitting: Cardiology

## 2019-06-19 ENCOUNTER — Other Ambulatory Visit: Payer: Self-pay

## 2019-06-19 ENCOUNTER — Ambulatory Visit: Payer: Medicare Other | Admitting: Cardiology

## 2019-06-19 VITALS — BP 110/56 | HR 94 | Ht 62.0 in | Wt 183.2 lb

## 2019-06-19 DIAGNOSIS — R0602 Shortness of breath: Secondary | ICD-10-CM

## 2019-06-19 DIAGNOSIS — I872 Venous insufficiency (chronic) (peripheral): Secondary | ICD-10-CM | POA: Diagnosis not present

## 2019-06-19 DIAGNOSIS — R9431 Abnormal electrocardiogram [ECG] [EKG]: Secondary | ICD-10-CM | POA: Diagnosis not present

## 2019-06-19 DIAGNOSIS — N183 Chronic kidney disease, stage 3 unspecified: Secondary | ICD-10-CM

## 2019-06-19 NOTE — Progress Notes (Signed)
Cardiology Office Note:    Date:  06/19/2019   ID:  [name], DOB 02-01-1941, MRN [id number]  PCP:  [name], MD  Cardiologist:  Fransico Him, MD    Referring MD: [name], MD   Chief Complaint  Patient presents with  . Shortness of Breath    History of Present Illness:    [name] is a 79 y.o. male with a prior dx of chronic diastolic CHF (later felt not to have CHF after normal RHC in [place]), chronic kidney disease stage 4, hypertension, bipolar disorder,chronic lower extremity edema on torsemide and chronic low back pain.  He was visiting his daughter in [place] in June and fell and fractured his hip. He underwent surgical repair which was complicated by respiratory failure requiring BiPAP as well as acute on CKD stage 3 and post op ileus. He was given a dx of acute diastolic CHF due to findings of diastolic dysfunction on echo but was felt to have resulted due to volume resuscitation with liters of fluid. Cardiology evaluated him during hospital stay and felt that diastolic CHF was a wrong dx. He went to rehab and had problems with severe LE edema requiring leg wraps. It was felt that his LE edema was likely related to chronic venous stasis, hypoalbuminea, sedentary state and constantly sitting with legs hanging down. Cxrays showed atelectasis with no effusions or edema.  Due to SOB and ongoing LE edema he was referred to another Cardiologist in [place] for a second opinion. In review of the records apparently he denied having any SOB but his daughter informed the Cardiologist that his breathing had been terrible. Echo 10/13/2017 showed small pericardial effusion and normal LVF with EF 70% and normal RVF with dilated non-collapsible IVC. His BNP was only 17. Due to inconsistency between data and sx he underwent right heart cath which showed normal filling pressures on 11/15/2017 (CVP was 57mmHg, normal PAP 36/59mmHg with mean 75mmHg, PCWP 69mmHg  and CO 5.8L/min). It was felt that his SOB and LE edema were unlikely to be due to a cardiac etiology. He was discharged home and set up to see Pulmonary. His creatinine bumped from baseline of 2.5 to 2.8 and diuretics were held and creatinine dropped back to 2.6 and he was discharged on Torsemide 20mg  daily and was instructed to followup with his nephrologist in [place]. LE venous dopplers were negative for DVT.  When he was last seen by Renal he wasstill complaining of lower extremity edema although his weight was down. [facility] and he was instructed to continue current Torsemide 20mg  daily.  Unfortunately he was admitted 03/24/2019 after suffering a fall and fractured his left proximal humerus.  After that he stopped taking his torsemide due to increased need to urinate and difficulty with changing his adult diaper.  He developed increased DOE and SOB at rest with increased LE edema and was admitted to [facility].  His BNP and pro-BNP were normal at 46 and 103 and hs trop was normal x 2.  Cxray showed no edema.  Even though his Cxray and BNp were normal the hospitalist gave him a dx of acute diastolic CHF instead of volume overload from CKD and noncompliance with diuretics.  He was diuresed and referred back to Nephrology who then increased his diuretics further.  He was also referred back to Cards.  He is here today and still complains of SOB which has been a chronic complaint along with his LE edema.  He  states that he has been compliant with his diuretics.  He denies any chest pain or pressure.  His SOB is nonexertional and exertional.    Past Medical History:  Diagnosis Date  . Arthritis    back  . Back pain    reason unknown  . Benign prostatic hyperplasia (BPH) with urinary urge incontinence    sees Dr. Jeffie Pollock   . Bipolar 1 disorder [facility])    sees Dr. Norma Fredrickson  . CKD (chronic kidney disease) stage 4, GFR 15-29 ml/min (HCC)     followed by [facility]  . Depression     takes Parnate daily  . Diverticulitis of colon 05/27/2015  . Essential hypertension 04/11/2007  . Headache(784.0)    occasionally  . Hip fracture ([place]) 09/01/2017   left hip fracture  . History of colon polyps   . Hypothyroidism 10/03/2009   Noted by Dr. Arnoldo Morale- patient states never on medicine- may have been checked due to parnate and abilify   . 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 Joint pain   . Joint swelling   . Osteoporosis    takes Drisdol weekly and Caltrate daily  . Peripheral edema    takes torsemide daily followed by nephrology  . Shingles 05/27/2015  . Sleep apnea    wears c-pap    Past Surgical History:  Procedure Laterality Date  . BACK SURGERY    . CARDIAC CATHETERIZATION    . COLONOSCOPY W/ POLYPECTOMY  03-10-15   per Dr. Havery Moros, adenomatous polyps, repeat in 3 yrs   . HIP FRACTURE SURGERY Left 09/02/2017  . REVERSE SHOULDER ARTHROPLASTY Right 05/22/2013   Procedure: REVERSE SHOULDER ARTHROPLASTY;  Surgeon: Nita Sells, MD;  Location: [facility];  Service: Orthopedics;  Laterality: Right;  Right reverse total shoulder  . TONSILLECTOMY    . TOTAL KNEE ARTHROPLASTY Right 12/08/2015   Procedure: TOTAL KNEE ARTHROPLASTY;  Surgeon: Dorna Leitz, MD;  Location: [facility];  Service: Orthopedics;  Laterality: Right;    Current Medications: Current Meds  Medication Sig  . acetaminophen (TYLENOL 8 HOUR) 650 MG CR tablet Take 1,300 mg by mouth 2 (two) times daily.   Marland Kitchen amoxicillin (AMOXIL) 500 MG capsule Take 2,000 mg by mouth daily as needed (dental procedure).   . ARIPiprazole (ABILIFY) 5 MG tablet Take 1 tablet (5 mg total) by mouth daily. (Patient taking differently: Take 2.5 mg by mouth daily. )  . b complex vitamins tablet Take 1 tablet by mouth daily.  . Calcium-Vitamin D (CALTRATE 600 PLUS-VIT D PO) Take 1 tablet by mouth daily.   . L-methylfolate Calcium 15 MG TABS Take 15 mg by mouth daily.  Marland Kitchen  torsemide (DEMADEX) 20 MG tablet Take 1 tablet (20 mg total) by mouth daily.  Marland Kitchen tranylcypromine (PARNATE) 10 MG tablet Take 30 mg by mouth 2 (two) times daily.      Allergies:   Sulfa antibiotics   Social History   Socioeconomic History  . Marital status: Divorced    Spouse name: Not on file  . Number of children: Not on file  . Years of education: Not on file  . Highest education level: Not on file  Occupational History  . Not on file  Tobacco Use  . Smoking status: Never Smoker  . Smokeless tobacco: Never Used  Substance and Sexual Activity  . Alcohol use: No    Alcohol/week: 0.0 standard drinks  . Drug use: No  .  Sexual activity: Yes  Other Topics Concern  . Not on file  Social History Narrative   GF Luther Redo. 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 Difficulty of Paying Living Expenses:   Food Insecurity:   . Worried About Charity fundraiser in the Last Year:   . Arboriculturist in the Last Year:   Transportation Needs:   . Film/video editor (Medical):   Marland Kitchen Lack of Transportation (Non-Medical):   Physical Activity:   . Days of Exercise per Week:   . Minutes of Exercise per Session:   Stress:   . Feeling of Stress :   Social Connections:   . Frequency of Communication with Friends and Family:   . Frequency of Social Gatherings with Friends and Family:   . Attends Religious Services:   . Active Member of Clubs or Organizations:   . Attends Archivist Meetings:   Marland Kitchen Marital Status:      Family History: The patient's family history includes Appendicitis in his paternal grandfather; Cancer in his maternal grandmother and mother; Prostate cancer in his brother; Stroke in his father. There is no history of Colon cancer, Esophageal cancer, Rectal cancer, Stomach  cancer, or Colon polyps.  ROS:   Please see the history of present illness.    ROS  All other systems reviewed and negative.   EKGs/Labs/Other Studies Reviewed:    The following studies were reviewed today: OV notes from PCP  EKG:  EKG is not ordered today.    Recent Labs: 01/29/2019: NT-Pro BNP 103 03/24/2019: B Natriuretic Peptide 46.0 03/26/2019: Magnesium 2.3 04/03/2019: ALT 9; BUN 65; Creatinine, Ser 2.49; Hemoglobin 10.6; Platelets 347.0; Potassium 3.8; Sodium 139; TSH 2.26   Recent Lipid Panel    Component Value Date/Time   CHOL 140 03/14/2019 1151   TRIG 71.0 03/14/2019 1151   TRIG 58 03/23/2006 1139   HDL 57.10 03/14/2019 1151   CHOLHDL 2 03/14/2019 1151   VLDL 14.2 03/14/2019 1151   LDLCALC 68 03/14/2019 1151    Physical Exam:    VS:  BP (!) 110/56   Pulse 94   Ht 5\' 2"  (1.575 m)   Wt 183 lb 3.2 oz (83.1 kg)   SpO2 96%   BMI 33.51 kg/m     Wt Readings from Last 3 Encounters:  06/19/19 183 lb 3.2 oz (83.1 kg)  05/15/19 184 lb (83.5 kg)  04/03/19 181 lb 9.6 oz (82.4 kg)     GEN:  Well nourished, well developed in no acute distress HEENT: Normal NECK: No JVD; No carotid bruits LYMPHATICS: No lymphadenopathy CARDIAC: RRR, no murmurs, rubs, gallops RESPIRATORY:  Diffuse scattered wheezes anteriorly and posteriorly ABDOMEN: Soft, non-tender, non-distended MUSCULOSKELETAL:  1-2+ edema bilaterally; No deformity  SKIN: Warm and dry NEUROLOGIC:  Alert and oriented x 3 PSYCHIATRIC:  Normal affect   ASSESSMENT:    1. Chronic venous insufficiency   2. Stage 3 chronic kidney disease, unspecified whether stage 3a or 3b CKD   3. Abnormal EKG   4. Tachycardia   5. SOB (shortness of breath)    PLAN:    In order of problems listed above:  1. Chronic venous insufficiency/LE edema -LE venous dopplers were negatirve in [place].  -Right heart cath was  completely normal with normal right and left sided filling pressures. - cath was done for chronic LE  edema and SOB -Echo with normal LVF.  -It was felt that LE edema was not cardiac in etiology -He hangs his legs down when sitting and has had progression of CKD dz which is likely playing a role as well exacerbating his LE edema as well as chronic venous insufficiency -would benefit from using his compression hose -diuretics managed by nephrology  2. Chronic kidney disease stage 4 -[facility]   3. Abnormal EKG -EKG has demonstrated inferolateral ST abnormality. -He had a stress echo in [place] 10/07/2017  -lexiscan myoview 02/2018 with no ischemia - done for preop eval  4. Chronic DOE -he has wheezing both anteriorly and posterioly -his BNP was normal with recent admission and Cxray was normal in setting of LE edema and SOB which were due to volume overload from CKD and noncompliance with diuretics -he has been given a dx of chronic diastolic CHF in the past but this was incorrect dx with normal right heart cath in [place].   -I do not think that he has chronic diastolic CHF - his BNP has been persistently normal and RHC normal.  His volume overload with LE edema is due to CKD with volume overload and not CHF.   -since he has continued to have SOB and now has wheezing on exam I will repeat a BNP and also get a cxray as well as CBC and 2D echo -I encouraged him to followup with nephrology -if workup is normal then he needs referral to pulmonary and also back to nephrology   Medication Adjustments/Labs and Tests Ordered: Current medicines are reviewed at length with the patient today.  Concerns regarding medicines are outlined above.  Orders Placed This Encounter  Procedures  . DG Chest 2 View  . Pro b natriuretic peptide (BNP)  . CBC with Differential/Platelet  . ECHOCARDIOGRAM COMPLETE   No orders of the defined types were placed in this encounter.   Signed, Fransico Him, MD  06/19/2019 9:30 PM    [place]

## 2019-06-19 NOTE — Patient Instructions (Addendum)
Medication Instructions:  *If you need a refill on your cardiac medications before your next appointment, please call your pharmacy*  Lab Work: Your physician recommends that you have lab work today- BNP and CBC  If you have labs (blood work) drawn today and your tests are completely normal, you will receive your results only by: Marland Kitchen MyChart Message (if you have MyChart) OR . A paper copy in the mail If you have any lab test that is abnormal or we need to change your treatment, we will call you to review the results.  Testing/Procedures: A chest x-ray takes a picture of the organs and structures inside the chest, including the heart, lungs, and blood vessels. This test can show several things, including, whether the heart is enlarges; whether fluid is building up in the lungs; and whether pacemaker / defibrillator leads are still in place. [place] [address] [place], [place] [address]  Your physician has requested that you have an echocardiogram. Echocardiography is a painless test that uses sound waves to create images of your heart. It provides your doctor with information about the size and shape of your heart and how well your heart's chambers and valves are working. This procedure takes approximately one hour. There are no restrictions for this procedure.  Follow-Up: At [facility], you and your health needs are our priority.  As part of our continuing mission to provide you with exceptional heart care, we have created designated Provider Care Teams.  These Care Teams include your primary Cardiologist (physician) and Advanced Practice Providers (APPs -  Physician Assistants and Nurse Practitioners) who all work together to provide you with the care you need, when you need it.  We recommend signing up for the patient portal called "MyChart".  Sign up information is provided on this After Visit Summary.  MyChart is used to connect with patients for Virtual Visits (Telemedicine).  Patients are  able to view lab/test results, encounter notes, upcoming appointments, etc.  Non-urgent messages can be sent to your provider as well.   To learn more about what you can do with MyChart, go to [email or web address].    Your next appointment:   As needed  The format for your next appointment:   Either In Person or Virtual  Provider:   You may see Fransico Him, MD or one of the following Advanced Practice Providers on your designated Care Team:    Melina Copa, PA-C  Ermalinda Barrios, PA-C

## 2019-06-20 ENCOUNTER — Ambulatory Visit
Admission: RE | Admit: 2019-06-20 | Discharge: 2019-06-20 | Disposition: A | Discharge status: Home / Self Care | Payer: Medicare Other | Source: Ambulatory Visit | Attending: Cardiology | Admitting: Cardiology

## 2019-06-20 DIAGNOSIS — R0602 Shortness of breath: Secondary | ICD-10-CM

## 2019-06-20 LAB — PRO B NATRIURETIC PEPTIDE: NT-Pro BNP: 190 pg/mL (ref 0–486)

## 2019-06-20 LAB — CBC WITH DIFFERENTIAL/PLATELET
Basophils Absolute: 0.1 10*3/uL (ref 0.0–0.2)
Basos: 1 %
EOS (ABSOLUTE): 0.6 10*3/uL — ABNORMAL HIGH (ref 0.0–0.4)
Eos: 8 %
Hematocrit: 34.6 % — ABNORMAL LOW (ref 37.5–51.0)
Hemoglobin: 11.5 g/dL — ABNORMAL LOW (ref 13.0–17.7)
Immature Grans (Abs): 0 10*3/uL (ref 0.0–0.1)
Immature Granulocytes: 1 %
Lymphocytes Absolute: 1.1 10*3/uL (ref 0.7–3.1)
Lymphs: 15 %
MCH: 31.3 pg (ref 26.6–33.0)
MCHC: 33.2 g/dL (ref 31.5–35.7)
MCV: 94 fL (ref 79–97)
Monocytes Absolute: 1 10*3/uL — ABNORMAL HIGH (ref 0.1–0.9)
Monocytes: 14 %
Neutrophils Absolute: 4.4 10*3/uL (ref 1.4–7.0)
Neutrophils: 61 %
Platelets: 222 10*3/uL (ref 150–450)
RBC: 3.68 x10E6/uL — ABNORMAL LOW (ref 4.14–5.80)
RDW: 11.5 % — ABNORMAL LOW (ref 11.6–15.4)
WBC: 7.1 10*3/uL (ref 3.4–10.8)

## 2019-06-21 ENCOUNTER — Telehealth: Payer: Self-pay

## 2019-06-21 DIAGNOSIS — R0602 Shortness of breath: Secondary | ICD-10-CM

## 2019-06-21 DIAGNOSIS — R062 Wheezing: Secondary | ICD-10-CM

## 2019-06-21 NOTE — Telephone Encounter (Signed)
The patient has been notified of the result and verbalized understanding.  All questions (if any) were answered. Antonieta Iba, RN 06/21/2019 9:52 AM

## 2019-06-21 NOTE — Telephone Encounter (Signed)
-----   Message from Sueanne Margarita, MD sent at 06/20/2019 10:23 PM EDT ----- CBC stable with chronic anemia likely related to his CKD and normal BNP therefore his SOB is not cardiac related.  Cxray was also normal despite wheezing.  Please get him in to pulmonary next week for wheezing and SOB

## 2019-06-25 ENCOUNTER — Ambulatory Visit (HOSPITAL_COMMUNITY): Payer: Medicare Other | Attending: Cardiovascular Disease

## 2019-06-25 ENCOUNTER — Other Ambulatory Visit: Payer: Self-pay

## 2019-06-25 DIAGNOSIS — R0602 Shortness of breath: Secondary | ICD-10-CM

## 2019-06-26 ENCOUNTER — Telehealth: Payer: Self-pay | Admitting: Cardiology

## 2019-06-26 NOTE — Telephone Encounter (Signed)
The patient has been notified of the result and verbalized understanding.  All questions (if any) were answered. Antonieta Iba, RN 06/26/2019 8:20 AM  Always made patient aware that I have scheduled him to see Dr. Lamonte Sakai on 04/08 at 10am.

## 2019-06-26 NOTE — Telephone Encounter (Signed)
New message   Patient is retruning call about echo results. Please call

## 2019-06-28 ENCOUNTER — Encounter: Payer: Self-pay | Admitting: Emergency Medicine

## 2019-06-28 ENCOUNTER — Other Ambulatory Visit (HOSPITAL_COMMUNITY): Payer: Self-pay

## 2019-06-28 ENCOUNTER — Other Ambulatory Visit: Payer: Self-pay

## 2019-06-28 ENCOUNTER — Telehealth: Payer: Self-pay | Admitting: Emergency Medicine

## 2019-06-28 ENCOUNTER — Ambulatory Visit: Payer: Medicare Other | Admitting: Emergency Medicine

## 2019-06-28 VITALS — BP 122/90 | HR 90 | Ht 63.0 in | Wt 181.0 lb

## 2019-06-28 DIAGNOSIS — R0600 Dyspnea, unspecified: Secondary | ICD-10-CM | POA: Diagnosis not present

## 2019-06-28 DIAGNOSIS — G4733 Obstructive sleep apnea (adult) (pediatric): Secondary | ICD-10-CM | POA: Diagnosis not present

## 2019-06-28 DIAGNOSIS — R1310 Dysphagia, unspecified: Secondary | ICD-10-CM | POA: Diagnosis not present

## 2019-06-28 DIAGNOSIS — R0602 Shortness of breath: Secondary | ICD-10-CM | POA: Diagnosis not present

## 2019-06-28 NOTE — Progress Notes (Signed)
Subjective:    Patient ID: [name], male    DOB: Aug 26, 1940, 79 y.o.   MRN: [id number]  HPI 79 year old never smoker with a history of CKD, hypertension, bipolar disorder, obstructive sleep apnea for which she has used CPAP, BPH.  He has a history of dyspnea and lower extremity edema.  For a while he carried a diagnosis of possible diastolic CHF but evaluation by cardiology locally and also in [place] has suggested that this diagnosis is likely incorrect (right heart catheterization in [place]).   He is referred for dyspnea ands wheeze. He notices the SOB with showering, bending over. He hears the wheeze at varius times through the day whether he is exerting or at rest. Started a month ago.  He notes frequent dysphagia, coughing up stuff after swallowing food, doesn't seem to happen w liquids. He coughs in association with this but rarely at other times. Voice has been strong.   He has OSA, wears his CPAP "off and on" because he isn't sure it does much for him. He stopped it about 2 weeks ago.   CXR from 06/20/19 reviewed by me, no infiltrates or abnormalities.    Review of Systems As per HPI   Past Medical History:  Diagnosis Date  . Arthritis    back  . Back pain    reason unknown  . Benign prostatic hyperplasia (BPH) with urinary urge incontinence    sees Dr. Jeffie Pollock   . Bipolar 1 disorder [facility])    sees Dr. Norma Fredrickson  . CKD (chronic kidney disease) stage 4, GFR 15-29 ml/min (HCC)     followed by [facility]  . Depression    takes Parnate daily  . Diverticulitis of colon 05/27/2015  . Essential hypertension 04/11/2007  . Headache(784.0)    occasionally  . Hip fracture ([place]) 09/01/2017   left hip fracture  . History of colon polyps   . Hypothyroidism 10/03/2009   Noted by Dr. Arnoldo Morale- patient states never on medicine- may have been checked due to parnate and abilify   . Internal thrombosed hemorrhoids 07/05/2008   Qualifier: Diagnosis of  By: Arnoldo Morale  MD, Balinda Quails   . Joint pain   . Joint swelling   . Osteoporosis    takes Drisdol weekly and Caltrate daily  . Peripheral edema    takes torsemide daily followed by nephrology  . Shingles 05/27/2015  . Sleep apnea    wears c-pap     Family History  Problem Relation Age of Onset  . Cancer Mother        unknown type  . Stroke Father   . Cancer Maternal Grandmother   . Appendicitis Paternal Grandfather   . Prostate cancer Brother   . Colon cancer Neg Hx   . Esophageal cancer Neg Hx   . Rectal cancer Neg Hx   . Stomach cancer Neg Hx   . Colon polyps Neg Hx      Social History   Socioeconomic History  . Marital status: Divorced    Spouse name: Not on file  . Number of children: Not on file  . Years of education: Not on file  . Highest education level: Not on file  Occupational History  . Not on file  Tobacco Use  . Smoking status: Never Smoker  . Smokeless tobacco: Never Used  Substance and Sexual Activity  . Alcohol use: No    Alcohol/week: 0.0 standard drinks  . Drug use: No  . Sexual activity: Yes  Other  Topics Concern  . Not on file  Social History Narrative   GF Luther Redo. 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 Difficulty of Paying Living Expenses:   Food Insecurity:   . Worried About Charity fundraiser in the Last Year:   . Arboriculturist in the Last Year:   Transportation Needs:   . Film/video editor (Medical):   Marland Kitchen Lack of Transportation (Non-Medical):   Physical Activity:   . Days of Exercise per Week:   . Minutes of Exercise per Session:   Stress:   . Feeling of Stress :   Social Connections:   . Frequency of Communication with Friends and Family:   . Frequency of Social Gatherings with Friends and Family:   . Attends Religious Services:   . Active Member  of Clubs or Organizations:   . Attends Archivist Meetings:   Marland Kitchen Marital Status:   Intimate Partner Violence:   . Fear of Current or Ex-Partner:   . Emotionally Abused:   Marland Kitchen Physically Abused:   . Sexually Abused:   Was in [place] in [place], no overseas travel From [place], has lived there and  [place]  Allergies  Allergen Reactions  . Sulfa Antibiotics Nausea Only     Outpatient Medications Prior to Visit  Medication Sig Dispense Refill  . acetaminophen (TYLENOL 8 HOUR) 650 MG CR tablet Take 1,300 mg by mouth 2 (two) times daily.     Marland Kitchen amoxicillin (AMOXIL) 500 MG capsule Take 2,000 mg by mouth daily as needed (dental procedure).     . ARIPiprazole (ABILIFY) 5 MG tablet Take 1 tablet (5 mg total) by mouth daily. (Patient taking differently: Take 2.5 mg by mouth daily. ) 90 tablet 3  . b complex vitamins tablet Take 1 tablet by mouth daily.    . Calcium-Vitamin D (CALTRATE 600 PLUS-VIT D PO) Take 1 tablet by mouth daily.     . L-methylfolate Calcium 15 MG TABS Take 15 mg by mouth daily.    Marland Kitchen torsemide (DEMADEX) 20 MG tablet Take 1 tablet (20 mg total) by mouth daily. 90 tablet 0  . tranylcypromine (PARNATE) 10 MG tablet Take 30 mg by mouth 2 (two) times daily.      No facility-administered medications prior to visit.        Objective:   Physical Exam Vitals:   06/28/19 1016  BP: 122/90  Pulse: 90  SpO2: 99%  Weight: 181 lb (82.1 kg)  Height: 5\' 3"  (1.6 m)   Gen: Pleasant but stoic, chronically debilitated man, kyphotic, in no distress, depressed affect  ENT: No lesions,  mouth clear,  oropharynx crowded but clear, no postnasal drip  Neck: No JVD, mild mid inspiratory stridor, coarse expiratory noise but no overt stridor  Lungs: No use of accessory muscles, no crackles or wheezing on normal respiration, referred upper airway noise on forced expiration  Cardiovascular: Distant RRR, heart sounds normal, no murmur or gallops, no  peripheral edema  Musculoskeletal: No deformities, no cyanosis or clubbing  Neuro: alert, awake, interacts appropriately although a bit slow to respond.  Vague history giving  Skin: Warm, no lesions or rash, venous stasis changes B LE       Assessment &  Plan:  OSA (obstructive sleep apnea) He is not currently on his CPAP.  I encouraged him to restart it and he is willing to do so.  Unclear based on his history giving where there the timing of his symptoms, upper airway noise correspond with when he stopped the CPAP.  Dyspnea Difficulty getting clear history but based on his description this sounds like upper airway noise and associated multifactorial dyspnea.  He describes dysphagia, the upper airway noise and dyspnea, cough due to correspond with meals, not liquids.  I believe he needs pulmonary function testing to assess airflow.  He also needs a modified barium swallow to determine whether he is having occult or overt aspiration.  I suspect based on his airflows and depending on the persistence of his stridor we will need to visualize his posterior pharynx and glottis, either by bronchoscopy or probably by referral to ENT for laryngoscopy.  Baltazar Apo, MD, PhD 06/28/2019, 10:43 AM [facility] [phone] or if no answer [phone]

## 2019-06-28 NOTE — Assessment & Plan Note (Signed)
Difficulty getting clear history but based on his description this sounds like upper airway noise and associated multifactorial dyspnea.  He describes dysphagia, the upper airway noise and dyspnea, cough due to correspond with meals, not liquids.  I believe he needs pulmonary function testing to assess airflow.  He also needs a modified barium swallow to determine whether he is having occult or overt aspiration.  I suspect based on his airflows and depending on the persistence of his stridor we will need to visualize his posterior pharynx and glottis, either by bronchoscopy or probably by referral to ENT for laryngoscopy.

## 2019-06-28 NOTE — Patient Instructions (Signed)
We will arrange for full pulmonary function testing We will arrange for a modified barium swallow test to assess for aspiration symptoms Please restart your CPAP every night Depending on your evaluation above, persistence of symptoms we may decide to refer to ENT to visualize your upper airway Follow with Dr. Lamonte Sakai next available with full pulmonary function testing on the same day.

## 2019-06-28 NOTE — Telephone Encounter (Signed)
Modified Barium info given to pt.  Nothing further needed.

## 2019-06-28 NOTE — Telephone Encounter (Signed)
Spoke with pt, he states he was called and was returning our call. I advised him that I wasn't sure who called but I would see if Dr. Agustina Caroli nurse called him. As I reviewed the chart,  They may have been calling him to advise him of his appt for the swallow test. I gave him the information and nothing further is needed. FYI PCC's  Appt Date/Time/Location  07/10/2019  1:00 PM  at  [facility]  PreCertification Number (if available)        Order Questions  Question Answer Comment  Reason for Exam (SYMPTOM OR DIAGNOSIS REQUIRED) dysphagia   Note:  Appropriate reason for exam on pre-op X-rays should indicate the reason for the surgery. Examples include: Coronary Artery disease, osteoartiritis of the knee, brain tumor, etc.  Where should this test be performed? [facility]

## 2019-06-28 NOTE — Assessment & Plan Note (Signed)
He is not currently on his CPAP.  I encouraged him to restart it and he is willing to do so.  Unclear based on his history giving where there the timing of his symptoms, upper airway noise correspond with when he stopped the CPAP.

## 2019-07-02 DIAGNOSIS — Z09 Encounter for follow-up examination after completed treatment for conditions other than malignant neoplasm: Secondary | ICD-10-CM | POA: Diagnosis not present

## 2019-07-04 ENCOUNTER — Other Ambulatory Visit (HOSPITAL_COMMUNITY): Payer: Medicare Other

## 2019-07-05 ENCOUNTER — Telehealth: Payer: Self-pay | Admitting: Family Medicine

## 2019-07-05 NOTE — Telephone Encounter (Signed)
Lasix not on current med list, please advise.

## 2019-07-05 NOTE — Telephone Encounter (Signed)
  LAST APPOINTMENT DATE: 05/15/2019   NEXT APPOINTMENT DATE:@5 /13/2021  MEDICATION:Lasix   PHARMACY: [id number] - [place], [place] - [address] AT [facility] [address] Phone:  [phone]         COMMENTS: Patient is completely out and states he has been out for the last 48 hours   **Let patient know to contact pharmacy at the end of the day to make sure medication is ready. **  ** Please notify patient to allow 48-72 hours to process**  **Encourage patient to contact the pharmacy for refills or they can request refills through [facility]**  CLINICAL FILLS OUT ALL BELOW:   LAST REFILL:  QTY:  REFILL DATE:    OTHER COMMENTS:    Okay for refill?  Please advise

## 2019-07-05 NOTE — Telephone Encounter (Signed)
Called and lm for pt tcb. 

## 2019-07-05 NOTE — Telephone Encounter (Signed)
He should be on torsemide and not furosemide.  We also need him to follow-up with nephrology since cardiology does not think this is a heart issue.

## 2019-07-06 NOTE — Telephone Encounter (Signed)
Tired reaching out to pt again to give below information.

## 2019-07-09 ENCOUNTER — Other Ambulatory Visit: Payer: Self-pay

## 2019-07-09 ENCOUNTER — Encounter: Payer: Self-pay | Admitting: Family Medicine

## 2019-07-09 ENCOUNTER — Ambulatory Visit (INDEPENDENT_AMBULATORY_CARE_PROVIDER_SITE_OTHER): Payer: Medicare Other | Admitting: Family Medicine

## 2019-07-09 VITALS — BP 110/70 | HR 93 | Temp 98.0°F | Ht 63.0 in | Wt 179.2 lb

## 2019-07-09 DIAGNOSIS — I5189 Other ill-defined heart diseases: Secondary | ICD-10-CM | POA: Diagnosis not present

## 2019-07-09 DIAGNOSIS — G8929 Other chronic pain: Secondary | ICD-10-CM

## 2019-07-09 DIAGNOSIS — N184 Chronic kidney disease, stage 4 (severe): Secondary | ICD-10-CM | POA: Diagnosis not present

## 2019-07-09 DIAGNOSIS — N3941 Urge incontinence: Secondary | ICD-10-CM

## 2019-07-09 DIAGNOSIS — R159 Full incontinence of feces: Secondary | ICD-10-CM

## 2019-07-09 DIAGNOSIS — M545 Low back pain, unspecified: Secondary | ICD-10-CM

## 2019-07-09 DIAGNOSIS — M48061 Spinal stenosis, lumbar region without neurogenic claudication: Secondary | ICD-10-CM

## 2019-07-09 LAB — POC URINALSYSI DIPSTICK (AUTOMATED)
Bilirubin, UA: NEGATIVE
Blood, UA: NEGATIVE
Glucose, UA: NEGATIVE
Ketones, UA: NEGATIVE
Leukocytes, UA: NEGATIVE
Nitrite, UA: NEGATIVE
Protein, UA: NEGATIVE
Spec Grav, UA: 1.02 (ref 1.010–1.025)
Urobilinogen, UA: 0.2 E.U./dL
pH, UA: 6 (ref 5.0–8.0)

## 2019-07-09 LAB — CBC WITH DIFFERENTIAL/PLATELET
Basophils Absolute: 0.1 10*3/uL (ref 0.0–0.1)
Basophils Relative: 1.2 % (ref 0.0–3.0)
Eosinophils Absolute: 0.6 10*3/uL (ref 0.0–0.7)
Eosinophils Relative: 9 % — ABNORMAL HIGH (ref 0.0–5.0)
HCT: 38.7 % — ABNORMAL LOW (ref 39.0–52.0)
Hemoglobin: 13 g/dL (ref 13.0–17.0)
Lymphocytes Relative: 15.2 % (ref 12.0–46.0)
Lymphs Abs: 1.1 10*3/uL (ref 0.7–4.0)
MCHC: 33.6 g/dL (ref 30.0–36.0)
MCV: 95.7 fl (ref 78.0–100.0)
Monocytes Absolute: 0.8 10*3/uL (ref 0.1–1.0)
Monocytes Relative: 10.7 % (ref 3.0–12.0)
Neutro Abs: 4.5 10*3/uL (ref 1.4–7.7)
Neutrophils Relative %: 63.9 % (ref 43.0–77.0)
Platelets: 200 10*3/uL (ref 150.0–400.0)
RBC: 4.04 Mil/uL — ABNORMAL LOW (ref 4.22–5.81)
RDW: 12.7 % (ref 11.5–15.5)
WBC: 7.1 10*3/uL (ref 4.0–10.5)

## 2019-07-09 LAB — COMPREHENSIVE METABOLIC PANEL
ALT: 9 U/L (ref 0–53)
AST: 15 U/L (ref 0–37)
Albumin: 4.5 g/dL (ref 3.5–5.2)
Alkaline Phosphatase: 112 U/L (ref 39–117)
BUN: 54 mg/dL — ABNORMAL HIGH (ref 6–23)
CO2: 28 mEq/L (ref 19–32)
Calcium: 9.4 mg/dL (ref 8.4–10.5)
Chloride: 102 mEq/L (ref 96–112)
Creatinine, Ser: 2.54 mg/dL — ABNORMAL HIGH (ref 0.40–1.50)
GFR: 24.56 mL/min — ABNORMAL LOW (ref >60.00)
Glucose, Bld: 96 mg/dL (ref 70–99)
Potassium: 4.2 mEq/L (ref 3.5–5.1)
Sodium: 139 mEq/L (ref 135–145)
Total Bilirubin: 0.5 mg/dL (ref 0.2–1.2)
Total Protein: 6.8 g/dL (ref 6.0–8.3)

## 2019-07-09 MED ORDER — TORSEMIDE 20 MG PO TABS: 20.0000 mg | ORAL_TABLET | Freq: Every day | ORAL | 0 refills | Status: DC

## 2019-07-09 NOTE — Patient Instructions (Addendum)
I would like you to keep the appointment with [facility] for osteoporosis work-up   For urinary incontinence-we will get urinalysis today.  A1c in prediabetes range in January-doubt progression to diabetes.  Will get blood sugar with CMP.  For fecal incontinence-patient with history of lumbar stenosis with back surgery a few years ago-I have recommended follow-up with neurosurgery at this time and referral was placed.  Patient does not have anal wink and his rectal tone is diminished-I would like their opinion if this is related to his back.  Could also consider gastroenterology consult pending results.  I did recommend he practice tightening his rectal sphincter and buttocks multiple times per hour. I would do sets of 10 every hour.    We will call you within two weeks about your referral to neurosurgery. If you do not hear within 3 weeks, give [place] a call.     Please stop by lab before you go If you do not have mychart- we will call you about results within 5 business days of [place] receiving them.  If you have mychart- we will send your results within 3 business days of [place] receiving them.  If abnormal or we want to clarify a result, we will call or mychart you to make sure you receive the message.  If you have questions or concerns or don't hear within 5 business days, please send [place] a message or call us.    Recommended follow up: Return in about 3 months (around 10/08/2019) for follow up- or sooner if needed.

## 2019-07-09 NOTE — Addendum Note (Signed)
Addended by: Francis Dowse T on: 07/09/2019 11:21 AM   Modules accepted: Orders

## 2019-07-09 NOTE — Progress Notes (Addendum)
Phone [phone] In person visit   Subjective:   [name] is a 79 y.o. year old very pleasant male patient who presents for/with See problem oriented charting Chief Complaint  Patient presents with  . Follow-up  . lost control of bowels    This visit occurred during the SARS-CoV-2 public health emergency.  Safety protocols were in place, including screening questions prior to the visit, additional usage of staff PPE, and extensive cleaning of exam room while observing appropriate contact time as indicated for disinfecting solutions.   Past Medical History-  Patient Active Problem List   Diagnosis Date Noted  . Diastolic dysfunction 16/55/3748    Priority: High  . Mild depressed bipolar I disorder ([place]) 09/21/2006    Priority: High  . Chronic low back pain with degenerative lumbar spinal stenosis-minimal improvement with surgery late 2019 03/08/2017    Priority: Medium  . Gout 07/07/2016    Priority: Medium  . Fatty liver 10/09/2014    Priority: Medium  . OSA (obstructive sleep apnea) 10/30/2008    Priority: Medium  . Osteoporosis 09/25/2008    Priority: Medium  . CKD (chronic kidney disease), stage IV ([place]) 03/01/2008    Priority: Medium  . History of fracture of left hip 09/01/2017    Priority: Low  . Senile purpura ([place]) 06/22/2017    Priority: Low  . Osteoarthritis of spine 03/21/2017    Priority: Low  . Hypersomnolence 08/20/2016    Priority: Low  . Other constipation 01/13/2016    Priority: Low  . Primary osteoarthritis of right knee 12/08/2015    Priority: Low  . Gallstones 10/09/2014    Priority: Low  . Bilateral lower extremity edema 09/25/2014    Priority: Low  . Chronic venous insufficiency 08/29/2013    Priority: Low  . Right shoulder pain 08/29/2013    Priority: Low  . Neuropathy 01/20/2011    Priority: Low  . Acute CHF (congestive heart failure) ([place]) 03/24/2019  . Anemia due to stage 4 chronic kidney disease ([place]) 03/24/2019  . Wound of  left lower extremity 03/24/2019  . Weakness 03/24/2019  . Paroxysmal tachycardia ([place]) 12/05/2018  . Spondylolisthesis of lumbar region 03/10/2018  . Dyspnea 11/11/2017  . Leukocytosis 09/04/2017  . Proximal humerus fracture 05/22/2013    Medications- reviewed and updated Current Outpatient Medications  Medication Sig Dispense Refill  . acetaminophen (TYLENOL 8 HOUR) 650 MG CR tablet Take 1,300 mg by mouth 2 (two) times daily.     Marland Kitchen amoxicillin (AMOXIL) 500 MG capsule Take 2,000 mg by mouth daily as needed (dental procedure).     . ARIPiprazole (ABILIFY) 5 MG tablet Take 1 tablet (5 mg total) by mouth daily. (Patient taking differently: Take 2.5 mg by mouth daily. ) 90 tablet 3  . b complex vitamins tablet Take 1 tablet by mouth daily.    . Calcium-Vitamin D (CALTRATE 600 PLUS-VIT D PO) Take 1 tablet by mouth daily.     . L-methylfolate Calcium 15 MG TABS Take 15 mg by mouth daily.    Marland Kitchen torsemide (DEMADEX) 20 MG tablet Take 1 tablet (20 mg total) by mouth daily. 90 tablet 0  . tranylcypromine (PARNATE) 10 MG tablet Take 30 mg by mouth 2 (two) times daily.      No current facility-administered medications for this visit.     Objective:  BP 110/70   Pulse 93   Temp 98 F (36.7 C)   Ht 5\' 3"  (1.6 m)   Wt 179 lb 3.2 oz (  81.3 kg)   SpO2 94%   BMI 31.74 kg/m  Gen: NAD, resting comfortably, snoring in his seat as I entered the room (encouraged him to use his cpap) CV: irregular heart beat (history of PACs) no murmurs rubs or gallops Lungs: CTAB no crackles, wheeze, rhonchi Abdomen: soft/nontender/nondistended/normal bowel sounds. No rebound or guarding.  Ext: Trace to 1+ edema Skin: warm, dry GU: No anal wink noted.  Weak rectal tone Antalgic slow gait    Assessment and Plan  #Fecal and urinary incontinence  #Chronic low back pain- degenerative lumbar stenosis.  Minimal improvement with surgery late 2019 S: Patient states he has been having issues with incontinence or he has  trouble making it to the bathroom for approximately 2 to 3 months. No incontinence with cough or sneezing. Feels urge all of the sudden to go. Started with adult diapers 2-3 months ago.   In the last month he is also started having issues controlling his stools.  For instance if he goes to a restaurant and comes home and is trying to make it to the bathroom he may lose a portion of the stool-has a hard time controlling the stool and can lose large volumes in his pants.  No saddle anesthesia noted.  No fall or injury in the last month.  No worsening back pain. Patient with history of degenerative disc disease/lumbar stenosis-had surgery late 2019 without significant improvement with Dr. Kathyrn Sheriff. Has had discussion about spinal cord stimulator with another doctor- he is hesitant with prior poor results.   Had loose bowels years ago on gabapentin- resolved of it. He states stools are slightly loose but not bad.  A/P: For urinary incontinence-we will get urinalysis today.  A1c in prediabetes range in January-doubt progression to diabetes.  Will get blood sugar with CMP.  Lasix likely contributes to this issue.  I am more concerned about the incontinence of stool - sounds like urge incontinence- I dont feel great about addingan anticholinergic or mybetriq a tthis time.    For fecal incontinence-patient with history of lumbar stenosis with back surgery a few years ago-I have recommended follow-up with neurosurgery at this time and referral was placed.  Patient does not have anal wink and his rectal tone is diminished-I would like their opinion if this is related to his back.  Could also consider gastroenterology or neurology consult pending results.  I did recommend he practice tightening his rectal sphincter and buttocks multiple times per hour. I would do sets of 10 every hour.  - no worsening leg weakness.   # Osteopososis- referred to [facility] due to complexity of case -osteoporosis, significant CKD. He has  not been seen yet by [facility]. Edmonia James at [facility]. He is leaning toward cancelling this- I encouraged him to keep it  #diastolic dysfunction- not CHF based on prior right heart cath per cardiology  #CKD stage IV S: Compliant with torsemide 20 mg.   2019-Has seen Dr. Radford Pax. Stress test ok as well as Holter monitor- does have PACs.   Follows with [facility].  Remains on torsemide 20 mg daily.  Knows to avoid NSAIDs.  Cr stable in 2.4-2.6 range for most part recently.  A/P: Edema appears stable- hoping CKD IV is as well- update labs today  For diastolic dysfunction and CKD IV- continue torsemide.   Also having dyspnea workup with pulmonology.    Recommended follow up: Return in about 3 months (around 10/08/2019) for follow up- or sooner if needed. Future Appointments  Date  Time Provider [place]  07/16/2019 10:45 AM [facility]ENING [facility] None  07/19/2019 11:30 AM [facility] None  07/19/2019  1:30 PM Byrum, Rose Fillers, MD [facility] None  08/02/2019  1:20 PM [name], MD [facility]    Lab/Order associations:   ICD-10-CM   1. Incontinence of feces, unspecified fecal incontinence type  R15.9 Ambulatory referral to Neurosurgery  2. Diastolic dysfunction  Q46.96   3. Chronic bilateral low back pain, unspecified whether sciatica present  M54.5 Ambulatory referral to Neurosurgery   G89.29   4. Spinal stenosis of lumbar region, unspecified whether neurogenic claudication present  M48.061 Ambulatory referral to Neurosurgery  5. CKD (chronic kidney disease), stage IV (H[facility])  N18.4 CBC with Differential/Platelet    Comprehensive metabolic panel    POCT Urinalysis Dipstick (Automated)  6. Urge incontinence  N39.41 POCT Urinalysis Dipstick (Automated)    Meds ordered this encounter  Medications  . torsemide (DEMADEX) 20 MG tablet    Sig: Take 1 tablet (20 mg total) by mouth daily.    Dispense:  90 tablet    Refill:  0    Time Spent: 39 minutes of total time  (10: 36AM- 11:05 AM) was spent on the date of the encounter performing the following actions: chart review prior to seeing the patient, obtaining history, performing a medically necessary exam, counseling on the treatment plan, placing orders, and documenting in our EHR.   Return precautions advised.  Garret Reddish, MD

## 2019-07-10 ENCOUNTER — Encounter (HOSPITAL_COMMUNITY): Payer: Medicare Other

## 2019-07-10 ENCOUNTER — Ambulatory Visit (HOSPITAL_COMMUNITY): Payer: Medicare Other

## 2019-07-16 ENCOUNTER — Other Ambulatory Visit (HOSPITAL_COMMUNITY): Payer: Medicare Other

## 2019-07-16 ENCOUNTER — Other Ambulatory Visit (HOSPITAL_COMMUNITY)
Admission: RE | Admit: 2019-07-16 | Discharge: 2019-07-16 | Disposition: A | Discharge status: Home / Self Care | Payer: Medicare Other | Source: Ambulatory Visit | Attending: Emergency Medicine | Admitting: Emergency Medicine

## 2019-07-16 DIAGNOSIS — Z20822 Contact with and (suspected) exposure to covid-19: Secondary | ICD-10-CM | POA: Diagnosis not present

## 2019-07-16 DIAGNOSIS — Z01812 Encounter for preprocedural laboratory examination: Secondary | ICD-10-CM | POA: Insufficient documentation

## 2019-07-16 DIAGNOSIS — M545 Low back pain: Secondary | ICD-10-CM | POA: Diagnosis not present

## 2019-07-16 LAB — SARS CORONAVIRUS 2 (TAT 6-24 HRS): SARS Coronavirus 2: NEGATIVE

## 2019-07-18 DIAGNOSIS — Z9889 Other specified postprocedural states: Secondary | ICD-10-CM | POA: Diagnosis not present

## 2019-07-19 ENCOUNTER — Encounter: Payer: Self-pay | Admitting: Emergency Medicine

## 2019-07-19 ENCOUNTER — Other Ambulatory Visit: Payer: Self-pay

## 2019-07-19 ENCOUNTER — Ambulatory Visit (INDEPENDENT_AMBULATORY_CARE_PROVIDER_SITE_OTHER): Payer: Medicare Other | Admitting: Emergency Medicine

## 2019-07-19 ENCOUNTER — Ambulatory Visit: Payer: Medicare Other | Admitting: Emergency Medicine

## 2019-07-19 VITALS — BP 110/70 | HR 96 | Temp 98.2°F | Ht 62.0 in | Wt 178.0 lb

## 2019-07-19 DIAGNOSIS — R0600 Dyspnea, unspecified: Secondary | ICD-10-CM | POA: Diagnosis not present

## 2019-07-19 DIAGNOSIS — R1312 Dysphagia, oropharyngeal phase: Secondary | ICD-10-CM | POA: Diagnosis not present

## 2019-07-19 DIAGNOSIS — J988 Other specified respiratory disorders: Secondary | ICD-10-CM | POA: Diagnosis not present

## 2019-07-19 DIAGNOSIS — R0602 Shortness of breath: Secondary | ICD-10-CM | POA: Diagnosis not present

## 2019-07-19 DIAGNOSIS — R1310 Dysphagia, unspecified: Secondary | ICD-10-CM | POA: Insufficient documentation

## 2019-07-19 DIAGNOSIS — G4733 Obstructive sleep apnea (adult) (pediatric): Secondary | ICD-10-CM | POA: Diagnosis not present

## 2019-07-19 LAB — PULMONARY FUNCTION TEST
DL/VA % pred: 86 %
DL/VA: 3.48 ml/min/mmHg/L
DLCO cor % pred: 77 %
DLCO cor: 14.61 ml/min/mmHg
DLCO unc % pred: 73 %
DLCO unc: 13.91 ml/min/mmHg
FEF 25-75 Post: 1.59 L/sec
FEF 25-75 Pre: 1.59 L/sec
FEF2575-%Change-Post: 0 %
FEF2575-%Pred-Post: 120 %
FEF2575-%Pred-Pre: 120 %
FEV1-%Change-Post: 0 %
FEV1-%Pred-Post: 96 %
FEV1-%Pred-Pre: 96 %
FEV1-Post: 1.87 L
FEV1-Pre: 1.88 L
FEV1FVC-%Change-Post: 9 %
FEV1FVC-%Pred-Pre: 103 %
FEV6-%Change-Post: -8 %
FEV6-%Pred-Post: 90 %
FEV6-%Pred-Pre: 98 %
FEV6-Post: 2.31 L
FEV6-Pre: 2.52 L
FEV6FVC-%Change-Post: 0 %
FEV6FVC-%Pred-Post: 109 %
FEV6FVC-%Pred-Pre: 108 %
FVC-%Change-Post: -8 %
FVC-%Pred-Post: 82 %
FVC-%Pred-Pre: 90 %
FVC-Post: 2.31 L
FVC-Pre: 2.54 L
Post FEV1/FVC ratio: 81 %
Post FEV6/FVC ratio: 100 %
Pre FEV1/FVC ratio: 74 %
Pre FEV6/FVC Ratio: 99 %
RV % pred: 97 %
RV: 2.13 L
TLC % pred: 90 %
TLC: 4.93 L

## 2019-07-19 NOTE — Progress Notes (Signed)
Subjective:    Patient ID: [name], male    DOB: Oct 28, 1940, 79 y.o.   MRN: [id number]  HPI 79 year old never smoker with a history of CKD, hypertension, bipolar disorder, obstructive sleep apnea for which she has used CPAP, BPH.  He has a history of dyspnea and lower extremity edema.  For a while he carried a diagnosis of possible diastolic CHF but evaluation by cardiology locally and also in [place] has suggested that this diagnosis is likely incorrect (right heart catheterization in [place]).   He is referred for dyspnea ands wheeze. He notices the SOB with showering, bending over. He hears the wheeze at varius times through the day whether he is exerting or at rest. Started a month ago.  He notes frequent dysphagia, coughing up stuff after swallowing food, doesn't seem to happen w liquids. He coughs in association with this but rarely at other times. Voice has been strong.   He has OSA, wears his CPAP "off and on" because he isn't sure it does much for him. He stopped it about 2 weeks ago.   CXR from 06/20/19 reviewed by me, no infiltrates or abnormalities.   ROV 07/19/19 --this is a follow-up visit 79 year old never smoker with hypertension, CKD, bipolar disorder, marginally treated OSA.  Continue to evaluation of dyspnea and wheezing.  He also has dysphagia and cough with associated with swallowing solids. We ordered modified barium swallow evaluation, but he didn't get it done. He continues to intermittently have dyspnea with activity. He hears wheeze that sounds like UA noise by description, last heard this 4/28.  Pulmonary function testing done today reviewed by me shows poor technique due to patient's somnolence -he was barely able to stay awake during the test.  He had grossly normal airflows but his flow volume curve was consistent with truncation of both the inspiratory and expiratory loops suggestive of possible fixed obstruction. He has not restarted CPAP - some  barriers at home, going through a separation.    Review of Systems As per HPI     Objective:   Physical Exam Vitals:   07/19/19 1336  BP: 110/70  Pulse: 96  Temp: 98.2 F (36.8 C)  TempSrc: Temporal  SpO2: 96%  Weight: 178 lb (80.7 kg)  Height: 5\' 2"  (1.575 m)   Gen: Pleasant but stoic, chronically debilitated man, kyphotic, in no distress, depressed affect  ENT: No lesions,  mouth clear,  oropharynx crowded but clear, no postnasal drip  Neck: No JVD, end-insp stridor esp w deep inspiration.   Lungs: No use of accessory muscles, no crackles or wheezing on normal respiration, referred upper airway noise on forced expiration  Cardiovascular: Distant RRR, heart sounds normal, no murmur or gallops, no peripheral edema  Musculoskeletal: No deformities, no cyanosis or clubbing  Neuro: alert, awake, interacts appropriately although a bit slow to respond.  Vague history giving  Skin: Warm, no lesions or rash, venous stasis changes B LE       Assessment & Plan:  OSA (obstructive sleep apnea) Based on his hypersomnolence today highly recommend he gets back on CPAP reliably nightly.  I discussed this with him.  Current barrier is that he is going through a separation and is not sleeping in his normal bedroom.  Is going to work on compliance.  Dyspnea Overall adequate FEV1 and FVC but the flow volume loop has truncated inspiratory and expiratory curves.  Consider large airways obstruction although pattern is not definitive for this.  Need to evaluate for  upper airway obstruction.  I think that we should start with ENT since this is less invasive.  If unrevealing then consider bronchoscopy going forward.  I do not see any indication to initiate bronchodilator therapy.  Dysphagia He decided to forego the modified barium swallow for some reason.  He states that he had this done before in [place].  I think he would benefit given the symptoms he describes.  Baltazar Apo, MD,  PhD 07/19/2019, 1:53 PM [facility] [phone] or if no answer [phone]

## 2019-07-19 NOTE — Assessment & Plan Note (Addendum)
Based on his hypersomnolence today highly recommend he gets back on CPAP reliably nightly.  I discussed this with him.  Current barrier is that he is going through a separation and is not sleeping in his normal bedroom.  Is going to work on compliance.

## 2019-07-19 NOTE — Assessment & Plan Note (Signed)
He decided to forego the modified barium swallow for some reason.  He states that he had this done before in [place].  I think he would benefit given the symptoms he describes.

## 2019-07-19 NOTE — Addendum Note (Signed)
Addended by: Vanessa Barbara on: 07/19/2019 02:09 PM   Modules accepted: Orders

## 2019-07-19 NOTE — Progress Notes (Signed)
Full PFT performed today. °

## 2019-07-19 NOTE — Patient Instructions (Signed)
Your pulmonary function testing and your symptoms are suggestive of an intermittent blockage of airflow in your throat or upper airway.  I would like for you to have this evaluated initially by ENT.  We will make a referral for them to examine your upper airway.  Depending on the results we may decide to examine further with a more in-depth procedure called a bronchoscopy which inspect all of your airways. We do not need to start any inhaled medication at this time. I believe that there still may be some benefit to performing the swallowing evaluation.  Please let us know if you change your mind about getting this in [place]. You would significantly benefit from restarting your CPAP every night.  I strongly encourage you to do this. Follow with Dr. Lamonte Sakai in 2 months or sooner if you have any problems.

## 2019-07-19 NOTE — Assessment & Plan Note (Addendum)
Overall adequate FEV1 and FVC but the flow volume loop has truncated inspiratory and expiratory curves.  Consider large airways obstruction although pattern is not definitive for this.  Need to evaluate for upper airway obstruction.  I think that we should start with ENT since this is less invasive.  If unrevealing then consider bronchoscopy going forward.  I do not see any indication to initiate bronchodilator therapy.

## 2019-07-20 DIAGNOSIS — N184 Chronic kidney disease, stage 4 (severe): Secondary | ICD-10-CM | POA: Diagnosis not present

## 2019-07-25 DIAGNOSIS — N184 Chronic kidney disease, stage 4 (severe): Secondary | ICD-10-CM | POA: Diagnosis not present

## 2019-07-25 DIAGNOSIS — I129 Hypertensive chronic kidney disease with stage 1 through stage 4 chronic kidney disease, or unspecified chronic kidney disease: Secondary | ICD-10-CM | POA: Diagnosis not present

## 2019-07-25 DIAGNOSIS — D631 Anemia in chronic kidney disease: Secondary | ICD-10-CM | POA: Diagnosis not present

## 2019-07-25 DIAGNOSIS — N2581 Secondary hyperparathyroidism of renal origin: Secondary | ICD-10-CM | POA: Diagnosis not present

## 2019-07-30 ENCOUNTER — Ambulatory Visit (HOSPITAL_COMMUNITY)
Admission: RE | Admit: 2019-07-30 | Discharge: 2019-07-30 | Disposition: A | Discharge status: Home / Self Care | Payer: Medicare Other | Source: Ambulatory Visit | Attending: Emergency Medicine | Admitting: Emergency Medicine

## 2019-07-30 ENCOUNTER — Other Ambulatory Visit: Payer: Self-pay

## 2019-07-30 DIAGNOSIS — R1310 Dysphagia, unspecified: Secondary | ICD-10-CM

## 2019-07-30 DIAGNOSIS — T17300A Unspecified foreign body in larynx causing asphyxiation, initial encounter: Secondary | ICD-10-CM | POA: Diagnosis not present

## 2019-07-30 NOTE — Progress Notes (Signed)
Modified Barium Swallow Progress Note  Patient Details  Name: [name] MRN: [id number] Date of Birth: [birth year]  Today's Date: 07/30/2019  Modified Barium Swallow completed.  Full report located under Chart Review in the Imaging Section.  Brief recommendations include the following:  Clinical Impression  Pt presents with a  moderate dysphagia primarily due to structural changes in the pharynx and cervical esophagus due to curvature of spine. The cervical spine has appearance of significant lordosis putting the pharynx in a horizontal position and increasing pressure on the cervical esophagus at C3 and C4. As a result there are instances of silent penetration and aspiration during the swallow with liquids and severe cervical esophageal residue and backflow of solids to the pharynx. Pt did not sense residue during study, only reports coughing intermittently, likely when it is so severe he aspirates. The simple strategies of following bites with sips of liquid and using an intermittent throat clear, clear residue and penetrate. Positional strategies and attempt at breath hold increase penetration. Pt verbalized undersatnding. No SLP f/u needed unless pt is not able to recall or follow strategies.    Swallow Evaluation Recommendations       SLP Diet Recommendations: Regular solids;Thin liquid   Liquid Administration via: Cup;Straw   Medication Administration: Crushed with puree   Supervision: Patient able to self feed   Compensations: Slow rate;Follow solids with liquid;Clear throat intermittently   Postural Changes: Remain semi-upright after after feeds/meals (Comment)   Oral Care Recommendations: Oral care BID       Herbie Baltimore, MA CCC-SLP  Acute Rehabilitation Services Pager [phone] Office [phone]  Lynann Beaver 07/30/2019,2:09 PM

## 2019-08-02 ENCOUNTER — Ambulatory Visit: Payer: Medicare Other | Admitting: Family Medicine

## 2019-08-06 DIAGNOSIS — H43813 Vitreous degeneration, bilateral: Secondary | ICD-10-CM | POA: Diagnosis not present

## 2019-08-06 DIAGNOSIS — H5000 Unspecified esotropia: Secondary | ICD-10-CM | POA: Diagnosis not present

## 2019-08-06 DIAGNOSIS — Z961 Presence of intraocular lens: Secondary | ICD-10-CM | POA: Diagnosis not present

## 2019-08-06 DIAGNOSIS — H04123 Dry eye syndrome of bilateral lacrimal glands: Secondary | ICD-10-CM | POA: Diagnosis not present

## 2019-08-22 DIAGNOSIS — M545 Low back pain: Secondary | ICD-10-CM | POA: Diagnosis not present

## 2019-08-23 DIAGNOSIS — M48061 Spinal stenosis, lumbar region without neurogenic claudication: Secondary | ICD-10-CM | POA: Diagnosis not present

## 2019-08-23 DIAGNOSIS — R030 Elevated blood-pressure reading, without diagnosis of hypertension: Secondary | ICD-10-CM | POA: Diagnosis not present

## 2019-08-24 DIAGNOSIS — G4733 Obstructive sleep apnea (adult) (pediatric): Secondary | ICD-10-CM | POA: Diagnosis not present

## 2019-09-14 ENCOUNTER — Ambulatory Visit: Payer: Medicare Other | Admitting: Emergency Medicine

## 2019-10-01 NOTE — Progress Notes (Signed)
Phone [phone] In person visit   Subjective:   [name] is a 79 y.o. year old very pleasant male patient who presents for/with See problem oriented charting Chief Complaint  Patient presents with  . Edema    lt leg more than right    This visit occurred during the SARS-CoV-2 public health emergency.  Safety protocols were in place, including screening questions prior to the visit, additional usage of staff PPE, and extensive cleaning of exam room while observing appropriate contact time as indicated for disinfecting solutions.   Past Medical History-  Patient Active Problem List   Diagnosis Date Noted  . Diastolic dysfunction 86/57/8469    Priority: High  . Mild depressed bipolar I disorder ([place]) 09/21/2006    Priority: High  . Chronic low back pain with degenerative lumbar spinal stenosis-minimal improvement with surgery late 2019 03/08/2017    Priority: Medium  . Gout 07/07/2016    Priority: Medium  . Fatty liver 10/09/2014    Priority: Medium  . OSA (obstructive sleep apnea) 10/30/2008    Priority: Medium  . Osteoporosis 09/25/2008    Priority: Medium  . CKD (chronic kidney disease), stage IV ([place]) 03/01/2008    Priority: Medium  . History of fracture of left hip 09/01/2017    Priority: Low  . Senile purpura ([place]) 06/22/2017    Priority: Low  . Osteoarthritis of spine 03/21/2017    Priority: Low  . Hypersomnolence 08/20/2016    Priority: Low  . Other constipation 01/13/2016    Priority: Low  . Primary osteoarthritis of right knee 12/08/2015    Priority: Low  . Gallstones 10/09/2014    Priority: Low  . Bilateral lower extremity edema 09/25/2014    Priority: Low  . Chronic venous insufficiency 08/29/2013    Priority: Low  . Right shoulder pain 08/29/2013    Priority: Low  . Neuropathy 01/20/2011    Priority: Low  . Dysphagia 07/19/2019  . Acute CHF (congestive heart failure) ([place]) 03/24/2019  . Anemia due to stage 4 chronic kidney disease ([place])  03/24/2019  . Wound of left lower extremity 03/24/2019  . Weakness 03/24/2019  . Paroxysmal tachycardia ([place]) 12/05/2018  . Spondylolisthesis of lumbar region 03/10/2018  . Dyspnea 11/11/2017  . Leukocytosis 09/04/2017  . Proximal humerus fracture 05/22/2013    Medications- reviewed and updated Current Outpatient Medications  Medication Sig Dispense Refill  . acetaminophen (TYLENOL 8 HOUR) 650 MG CR tablet Take 1,300 mg by mouth 2 (two) times daily.     Marland Kitchen amoxicillin (AMOXIL) 500 MG capsule Take 2,000 mg by mouth daily as needed (dental procedure).     . ARIPiprazole (ABILIFY) 5 MG tablet Take 1 tablet (5 mg total) by mouth daily. (Patient taking differently: Take 2.5 mg by mouth daily. ) 90 tablet 3  . b complex vitamins tablet Take 1 tablet by mouth daily.    . Calcium-Vitamin D (CALTRATE 600 PLUS-VIT D PO) Take 1 tablet by mouth daily.     . L-methylfolate Calcium 15 MG TABS Take 15 mg by mouth daily.    Marland Kitchen torsemide (DEMADEX) 20 MG tablet Take 1 tablet (20 mg total) by mouth daily. 90 tablet 0  . tranylcypromine (PARNATE) 10 MG tablet Take 30 mg by mouth 2 (two) times daily.     . mirabegron ER (MYRBETRIQ) 25 MG TB24 tablet Take 1 tablet (25 mg total) by mouth daily. 30 tablet 5   No current facility-administered medications for this visit.     Objective:  BP 118/70   Pulse 97   Temp 98.8 F (37.1 C) (Temporal)   Ht 5\' 2"  (1.575 m)   Wt 181 lb 12.8 oz (82.5 kg)   SpO2 96%   BMI 33.25 kg/m  Gen: NAD, resting comfortably CV: RRR no murmurs rubs or gallops Lungs: CTAB no crackles, wheeze, rhonchi Abdomen: soft/nontender/nondistended/normal bowel sounds.  Ext: no edema Skin: warm, dry Neuro: antalgic slow gait    Assessment and Plan   #Diastolic Dysfunction/shorntess of breath- not heart failure per cardiology #excessive somnolence/OSA S: Compliant with torsemide 20 mg Started with cardiology in 2019-Has seen Dr. Radford Pax. Stress test ok as well as Holter monitor. Also  has shortness of breath and excessive somnolence- was referred to Dr. Lamonte Sakai. He wanted him back on CPAP and also to see ENT for potential upper airway obstruction- patient reports has seen ENT and apparently has some narrowing of larynx and they gave him something to take- he feels better. Compliant with CPAP and seems less sleepy in the office today A/P: diastolic dysfunction appears stable. Does have chronic venous insufficiency - torsdemide for CKD IV helps some- also encouraged compression stockings but he states simply too hard to get on   For OSA_ continue cpap- seems to be doing better. Excess sleepiness is better with that as well as after his ENT visit- no follow up with pulmonary or ENT planned at this time.     #CKD stage IV S: Follows with [facility]- last visit  In April- was told everything stabel.  Remains on torsemide 20 mg daily.  Knows to avoid NSAIDs. A/P: suspect stable- update cbc/cmp today and continue tosemide    #Chronic low back pain- degenerative lumbar stenosis.  Minimal improvement with surgery late 2019 S:He saw his neurosurgeon in last month ot so- was told nothing could be done about the back. He had MRI at that time. He is not sure if he mentioned the fecal or urinary incontinence.  A/P: patient with continued pain- interested in spinal cord stimulator potentially- I would suspect neurosurgery office would have someone they use for that so encouraged him to call back   # incontinence- fecal and urinary S:urinary incontinence- feels like has to go all of sudden. Not with cough/sneezing  A/P: sounds like urge incontinence/overactive bladder. Trial myrbetriq- follow up 1-2 months for BP recheck. Offered urology referal but he wants to try this first  For fecal incontinence- apparently neurosurgery does not think related to his back issues. I recommended working on rectal strength with kegel type exercises at home.   Can use adult diapers  # osteoporosis- due to  CKD IV options limited. Endocrine recommended referral to [facility] Dr. Marcello Moores [place] bone specialist as may even have to have bone biopsy. Patient declines referral or repeat dexa as of 10/03/19   Recommended follow up: 1-2 motnh BP check  Lab/Order associations:   ICD-10-CM   1. Age-related osteoporosis without current pathological fracture  M81.0   2. CKD (chronic kidney disease), stage IV ([facility])  N18.4 CBC with Differential/Platelet    Comprehensive metabolic panel  3. Diastolic dysfunction  I01.65   4. OSA (obstructive sleep apnea)  G47.33   5. Chronic bilateral low back pain, unspecified whether sciatica present  M54.5    G89.29   6. Encounter for hepatitis C screening test for low risk patient  Z11.59 Hepatitis C Antibody    Meds ordered this encounter  Medications  . mirabegron ER (MYRBETRIQ) 25 MG TB24 tablet  Sig: Take 1 tablet (25 mg total) by mouth daily.    Dispense:  30 tablet    Refill:  5   Return precautions advised.  Garret Reddish, MD

## 2019-10-01 NOTE — Patient Instructions (Addendum)
Health Maintenance Due  Topic Date Due  . DEXA SCAN - declines for now as well as referral for [facility] 06/30/2019   Please stop by lab before you go If you have mychart- we will send your results within 3 business days of [place] receiving them.  If you do not have mychart- we will call you about results within 5 business days of [place] receiving them.   Lets try myrbetriq to see if that helps with urinary frequency. This may simply be due to the torsemide so may not help and if it doesn't help or you have side effects please stop the medicine. I would recommend rechecking blood pressure in 1-2 months as this can cause high blood pressure. We want blood pressure less than 140/90  If possible please use compression stockings  Call your neurosurgeon to see who they would use for spinal cord stimulator for chronic pback pain

## 2019-10-03 ENCOUNTER — Ambulatory Visit (INDEPENDENT_AMBULATORY_CARE_PROVIDER_SITE_OTHER): Payer: Medicare Other | Admitting: Family Medicine

## 2019-10-03 ENCOUNTER — Other Ambulatory Visit: Payer: Self-pay

## 2019-10-03 ENCOUNTER — Encounter: Payer: Self-pay | Admitting: Family Medicine

## 2019-10-03 VITALS — BP 118/70 | HR 97 | Temp 98.8°F | Ht 62.0 in | Wt 181.8 lb

## 2019-10-03 DIAGNOSIS — G4733 Obstructive sleep apnea (adult) (pediatric): Secondary | ICD-10-CM | POA: Diagnosis not present

## 2019-10-03 DIAGNOSIS — M545 Low back pain, unspecified: Secondary | ICD-10-CM

## 2019-10-03 DIAGNOSIS — M810 Age-related osteoporosis without current pathological fracture: Secondary | ICD-10-CM

## 2019-10-03 DIAGNOSIS — G8929 Other chronic pain: Secondary | ICD-10-CM

## 2019-10-03 DIAGNOSIS — Z1159 Encounter for screening for other viral diseases: Secondary | ICD-10-CM | POA: Diagnosis not present

## 2019-10-03 DIAGNOSIS — I5189 Other ill-defined heart diseases: Secondary | ICD-10-CM

## 2019-10-03 DIAGNOSIS — N184 Chronic kidney disease, stage 4 (severe): Secondary | ICD-10-CM

## 2019-10-03 LAB — CBC WITH DIFFERENTIAL/PLATELET
Basophils Absolute: 0.1 10*3/uL (ref 0.0–0.1)
Basophils Relative: 1.3 % (ref 0.0–3.0)
Eosinophils Absolute: 0.4 10*3/uL (ref 0.0–0.7)
Eosinophils Relative: 7.2 % — ABNORMAL HIGH (ref 0.0–5.0)
HCT: 35 % — ABNORMAL LOW (ref 39.0–52.0)
Hemoglobin: 11.8 g/dL — ABNORMAL LOW (ref 13.0–17.0)
Lymphocytes Relative: 15.8 % (ref 12.0–46.0)
Lymphs Abs: 1 10*3/uL (ref 0.7–4.0)
MCHC: 33.7 g/dL (ref 30.0–36.0)
MCV: 95.2 fl (ref 78.0–100.0)
Monocytes Absolute: 0.6 10*3/uL (ref 0.1–1.0)
Monocytes Relative: 10.3 % (ref 3.0–12.0)
Neutro Abs: 4 10*3/uL (ref 1.4–7.7)
Neutrophils Relative %: 65.4 % (ref 43.0–77.0)
Platelets: 201 10*3/uL (ref 150.0–400.0)
RBC: 3.68 Mil/uL — ABNORMAL LOW (ref 4.22–5.81)
RDW: 12.6 % (ref 11.5–15.5)
WBC: 6.1 10*3/uL (ref 4.0–10.5)

## 2019-10-03 LAB — COMPREHENSIVE METABOLIC PANEL
ALT: 8 U/L (ref 0–53)
AST: 12 U/L (ref 0–37)
Albumin: 4.4 g/dL (ref 3.5–5.2)
Alkaline Phosphatase: 114 U/L (ref 39–117)
BUN: 70 mg/dL — ABNORMAL HIGH (ref 6–23)
CO2: 27 mEq/L (ref 19–32)
Calcium: 9.4 mg/dL (ref 8.4–10.5)
Chloride: 104 mEq/L (ref 96–112)
Creatinine, Ser: 2.68 mg/dL — ABNORMAL HIGH (ref 0.40–1.50)
GFR: 23.07 mL/min — ABNORMAL LOW (ref >60.00)
Glucose, Bld: 95 mg/dL (ref 70–99)
Potassium: 3.9 mEq/L (ref 3.5–5.1)
Sodium: 139 mEq/L (ref 135–145)
Total Bilirubin: 0.6 mg/dL (ref 0.2–1.2)
Total Protein: 7 g/dL (ref 6.0–8.3)

## 2019-10-03 MED ORDER — MIRABEGRON ER 25 MG PO TB24: 25.0000 mg | ORAL_TABLET | Freq: Every day | ORAL | 5 refills | Status: DC

## 2019-10-04 LAB — HEPATITIS C ANTIBODY
Hepatitis C Ab: NONREACTIVE
SIGNAL TO CUT-OFF: 0.02 (ref <1.00)

## 2019-10-24 DIAGNOSIS — G894 Chronic pain syndrome: Secondary | ICD-10-CM | POA: Diagnosis not present

## 2019-10-24 DIAGNOSIS — M47816 Spondylosis without myelopathy or radiculopathy, lumbar region: Secondary | ICD-10-CM | POA: Diagnosis not present

## 2019-10-29 DIAGNOSIS — M47816 Spondylosis without myelopathy or radiculopathy, lumbar region: Secondary | ICD-10-CM | POA: Diagnosis not present

## 2019-11-05 NOTE — Progress Notes (Signed)
Phone [phone] In person visit   Subjective:   [name] is a 79 y.o. year old very pleasant male patient who presents for/with See problem oriented charting Chief Complaint  Patient presents with  . Ear Pain    having irriation in ear, cannot control bowels   This visit occurred during the SARS-CoV-2 public health emergency.  Safety protocols were in place, including screening questions prior to the visit, additional usage of staff PPE, and extensive cleaning of exam room while observing appropriate contact time as indicated for disinfecting solutions.   Past Medical History-  Patient Active Problem List   Diagnosis Date Noted  . Diastolic dysfunction 18/56/3149    Priority: High  . Mild depressed bipolar I disorder ([place]) 09/21/2006    Priority: High  . Bowel incontinence 11/12/2019    Priority: Medium  . Chronic low back pain with degenerative lumbar spinal stenosis-minimal improvement with surgery late 2019 03/08/2017    Priority: Medium  . Gout 07/07/2016    Priority: Medium  . Fatty liver 10/09/2014    Priority: Medium  . OSA (obstructive sleep apnea) 10/30/2008    Priority: Medium  . Osteoporosis 09/25/2008    Priority: Medium  . CKD (chronic kidney disease), stage IV ([place]) 03/01/2008    Priority: Medium  . Essential hypertension 04/11/2007    Priority: Medium  . Anemia due to stage 4 chronic kidney disease ([place]) 03/24/2019    Priority: Low  . History of fracture of left hip 09/01/2017    Priority: Low  . Senile purpura ([place]) 06/22/2017    Priority: Low  . Osteoarthritis of spine 03/21/2017    Priority: Low  . Hypersomnolence 08/20/2016    Priority: Low  . Other constipation 01/13/2016    Priority: Low  . Primary osteoarthritis of right knee 12/08/2015    Priority: Low  . Gallstones 10/09/2014    Priority: Low  . Bilateral lower extremity edema 09/25/2014    Priority: Low  . Chronic venous insufficiency 08/29/2013    Priority: Low  . Right  shoulder pain 08/29/2013    Priority: Low  . Neuropathy 01/20/2011    Priority: Low  . Dysphagia 07/19/2019  . Wound of left lower extremity 03/24/2019  . Weakness 03/24/2019  . Paroxysmal tachycardia ([place]) 12/05/2018  . Spondylolisthesis of lumbar region 03/10/2018  . Dyspnea 11/11/2017  . Leukocytosis 09/04/2017  . Proximal humerus fracture 05/22/2013    Medications- reviewed and updated Current Outpatient Medications  Medication Sig Dispense Refill  . acetaminophen (TYLENOL 8 HOUR) 650 MG CR tablet Take 1,300 mg by mouth 2 (two) times daily.     . Calcium-Vitamin D (CALTRATE 600 PLUS-VIT D PO) Take 1 tablet by mouth daily.     . L-methylfolate Calcium 15 MG TABS Take 15 mg by mouth daily.    . mirabegron ER (MYRBETRIQ) 25 MG TB24 tablet Take 1 tablet (25 mg total) by mouth daily. 30 tablet 5  . torsemide (DEMADEX) 20 MG tablet Take 1 tablet (20 mg total) by mouth daily. 90 tablet 0  . tranylcypromine (PARNATE) 10 MG tablet Take 30 mg by mouth 2 (two) times daily.     Marland Kitchen amoxicillin-clavulanate (AUGMENTIN) 500-125 MG tablet Take 1 tablet (500 mg total) by mouth 2 (two) times daily. For left ear infection. Adjusted for CKD IV 14 tablet 0   No current facility-administered medications for this visit.     Objective:  BP 122/68   Pulse 96   Temp 99 F (37.2 C) (Temporal)  Ht 5\' 2"  (1.575 m)   Wt 186 lb (84.4 kg)   SpO2 97%   BMI 34.02 kg/m  Gen: NAD, resting comfortably TM: normal on right after curetting out wax, on left erythematous and bulging noted CV: RRR no murmurs rubs or gallops Ext: 1+ edema, erythematous venous skin changes lower extremities. Has small opening of skin on left lower extremity, on right had larger open area 4 x 2 cm near a blister- some drainage from this area. Not warm/hot/particularly tender    Assessment and Plan   #hypertension S: medication: torsemide 20mg  BP Readings from Last 3 Encounters:  11/12/19 122/68  10/03/19 118/70  07/19/19  110/70  A/P: excellent control- continue current medicines  # Left Ear pain  S: Patient complains of left ear congestion/fullness/discomfort for the last 2 weeks.  Perhaps mild congestion in the right ear.  On the left ear feels like possible ear full of wax A/P: On exam there is no wax in the left ear canal-tympanic membrane is erythematous concerning for potential otitis media-we will cover with Augmentin renally dosed for the next 7 days.  If he fails to improve should follow up with me.  # Bowel incontinence / urinary incontinence S:has not tried kegels. Wearing depends type products. neurosurgery did not think issues were related to his back.   Reports tried mybetriq for urinary incontinence but made him too sleepy A/P: For bowel incontinence portion-recommended doing Kegel exercises.  Offered GI referral-patient declined at this time.  For urinary incontinence-patient would like to remain off medicine since he did not have good results with last trial   #CKD stage IV S: Follows with [facility].  Remains on torsemide 20 mg daily.  Knows to avoid NSAIDs. A/P: hopefully stable- follows up with nephrology next week. augmentin renally adjusted   # venous stasis - patient with some weeping on lower legs and very slight opening of skin- recommended continued dressings with topical antibiotic. He feels like improving. Recommended elevating leg and follow up if fails to continue to improve.   Recommended follow up: Return in about 4 months (around 03/13/2020) for physical or sooner if needed.  Lab/Order associations:   ICD-10-CM   1. Left otitis media, unspecified otitis media type  H66.92   2. Essential hypertension  I10   3. Incontinence of feces, unspecified fecal incontinence type  R15.9   4. CKD (chronic kidney disease), stage IV ([place])  N18.4     Meds ordered this encounter  Medications  . amoxicillin-clavulanate (AUGMENTIN) 500-125 MG tablet    Sig: Take 1 tablet (500 mg  total) by mouth 2 (two) times daily. For left ear infection. Adjusted for CKD IV    Dispense:  14 tablet    Refill:  0   Return precautions advised.  Garret Reddish, MD

## 2019-11-06 ENCOUNTER — Ambulatory Visit: Payer: Medicare Other | Admitting: Family Medicine

## 2019-11-06 DIAGNOSIS — M1712 Unilateral primary osteoarthritis, left knee: Secondary | ICD-10-CM | POA: Diagnosis not present

## 2019-11-06 DIAGNOSIS — M25562 Pain in left knee: Secondary | ICD-10-CM | POA: Diagnosis not present

## 2019-11-06 DIAGNOSIS — M79671 Pain in right foot: Secondary | ICD-10-CM | POA: Diagnosis not present

## 2019-11-06 DIAGNOSIS — Z96651 Presence of right artificial knee joint: Secondary | ICD-10-CM | POA: Diagnosis not present

## 2019-11-12 ENCOUNTER — Other Ambulatory Visit: Payer: Self-pay

## 2019-11-12 ENCOUNTER — Ambulatory Visit (INDEPENDENT_AMBULATORY_CARE_PROVIDER_SITE_OTHER): Payer: Medicare Other | Admitting: Family Medicine

## 2019-11-12 ENCOUNTER — Encounter: Payer: Self-pay | Admitting: Family Medicine

## 2019-11-12 VITALS — BP 122/68 | HR 96 | Temp 99.0°F | Ht 62.0 in | Wt 186.0 lb

## 2019-11-12 DIAGNOSIS — H6692 Otitis media, unspecified, left ear: Secondary | ICD-10-CM | POA: Diagnosis not present

## 2019-11-12 DIAGNOSIS — N184 Chronic kidney disease, stage 4 (severe): Secondary | ICD-10-CM

## 2019-11-12 DIAGNOSIS — R159 Full incontinence of feces: Secondary | ICD-10-CM | POA: Diagnosis not present

## 2019-11-12 DIAGNOSIS — I10 Essential (primary) hypertension: Secondary | ICD-10-CM | POA: Diagnosis not present

## 2019-11-12 MED ORDER — AMOXICILLIN-POT CLAVULANATE 500-125 MG PO TABS
1.0000 | ORAL_TABLET | Freq: Two times a day (BID) | ORAL | 0 refills | Status: DC
Start: 2019-11-12 — End: 2019-12-06

## 2019-11-12 NOTE — Assessment & Plan Note (Signed)
#   Bowel incontinence / urinary incontinence S:has not tried kegels. Wearing depends type products. neurosurgery did not think issues were related to his back.   Reports tried mybetriq for urinary incontinence but made him too sleepy A/P: For bowel incontinence portion-recommended doing Kegel exercises.  Offered GI referral-patient declined at this time.  For urinary incontinence-patient would like to remain off medicine since he did not have good results with last trial

## 2019-11-12 NOTE — Patient Instructions (Addendum)
Health Maintenance Due  Topic Date Due  . DEXA SCAN - this ideally would be done at [facility] for endocrinology- you have declined this previously 06/30/2019  . INFLUENZA VACCINE - Flu shot today - will complete later in flu season (please let us know if you get this at another location so we can update your chart) . We should have vaccination here in 1-2 months - can call back for an appointment.   10/21/2019   Start augmentin lower dose for kidney function and follow up in 10-14 days if not improving/resolved. May need to refer you to ear doctor. Wax is not the problem but looks like possible ear infection. Augmentin can cause diarrhea so I apologize if that happens- please try to take with food  Team- please place a dressing over open areas on the legs bilaterally- can use topical antibiotic.   Please let me know if open areas on the leg worsen- I think you have a reasonable strategy with the dressings and salve- try to elevate as much as possible. Compression stockings would help if you could get them on.   Recommended follow up: Return in about 4 months (around 03/13/2020) for physical or sooner if needed.

## 2019-11-13 ENCOUNTER — Telehealth: Payer: Self-pay | Admitting: Family Medicine

## 2019-11-13 NOTE — Progress Notes (Signed)
  Chronic Care Management   Outreach Note  11/13/2019 Name: [name] MRN: [id number] DOB: 1941-01-13  Referred by: [name], MD Reason for referral : No chief complaint on file.   An unsuccessful telephone outreach was attempted today. The patient was referred to the pharmacist for assistance with care management and care coordination.   Follow Up Plan:   Earney Hamburg Upstream Scheduler

## 2019-11-14 ENCOUNTER — Telehealth: Payer: Self-pay | Admitting: Family Medicine

## 2019-11-14 NOTE — Progress Notes (Signed)
°  Chronic Care Management   Outreach Note  11/14/2019 Name: [name] MRN: [id number] DOB: 12/22/1940  Referred by: [name], MD Reason for referral : No chief complaint on file.   An unsuccessful telephone outreach was attempted today. The patient was referred to the pharmacist for assistance with care management and care coordination.   Follow Up Plan:   Earney Hamburg Upstream Scheduler

## 2019-11-15 DIAGNOSIS — N184 Chronic kidney disease, stage 4 (severe): Secondary | ICD-10-CM | POA: Diagnosis not present

## 2019-11-19 DIAGNOSIS — M47816 Spondylosis without myelopathy or radiculopathy, lumbar region: Secondary | ICD-10-CM | POA: Diagnosis not present

## 2019-11-19 DIAGNOSIS — G894 Chronic pain syndrome: Secondary | ICD-10-CM | POA: Diagnosis not present

## 2019-11-20 DIAGNOSIS — L570 Actinic keratosis: Secondary | ICD-10-CM | POA: Diagnosis not present

## 2019-11-21 ENCOUNTER — Telehealth: Payer: Self-pay | Admitting: Family Medicine

## 2019-11-21 NOTE — Progress Notes (Signed)
  Chronic Care Management   Note  11/21/2019 Name: [name] MRN: [id number] DOB: 1941/03/15  [name] is a 79 y.o. year old male who is a primary care patient of [name], MD. I reached out to [name] by phone today in response to a referral sent by [name]'s PCP, [name], MD.   [name] was given information about Chronic Care Management services today including:  1. CCM service includes personalized support from designated clinical staff supervised by his physician, including individualized plan of care and coordination with other care providers 2. 24/7 contact phone numbers for assistance for urgent and routine care needs. 3. Service will only be billed when office clinical staff spend 20 minutes or more in a month to coordinate care. 4. Only one practitioner may furnish and bill the service in a calendar month. 5. The patient may stop CCM services at any time (effective at the end of the month) by phone call to the office staff.   Patient agreed to services and verbal consent obtained.   Follow up plan:   Earney Hamburg Upstream Scheduler

## 2019-11-22 DIAGNOSIS — M25561 Pain in right knee: Secondary | ICD-10-CM | POA: Diagnosis not present

## 2019-11-22 DIAGNOSIS — M25562 Pain in left knee: Secondary | ICD-10-CM | POA: Diagnosis not present

## 2019-11-22 DIAGNOSIS — Z96651 Presence of right artificial knee joint: Secondary | ICD-10-CM | POA: Diagnosis not present

## 2019-11-23 DIAGNOSIS — I129 Hypertensive chronic kidney disease with stage 1 through stage 4 chronic kidney disease, or unspecified chronic kidney disease: Secondary | ICD-10-CM | POA: Diagnosis not present

## 2019-11-23 DIAGNOSIS — L03119 Cellulitis of unspecified part of limb: Secondary | ICD-10-CM | POA: Diagnosis not present

## 2019-11-23 DIAGNOSIS — N2581 Secondary hyperparathyroidism of renal origin: Secondary | ICD-10-CM | POA: Diagnosis not present

## 2019-11-23 DIAGNOSIS — D631 Anemia in chronic kidney disease: Secondary | ICD-10-CM | POA: Diagnosis not present

## 2019-11-23 DIAGNOSIS — N184 Chronic kidney disease, stage 4 (severe): Secondary | ICD-10-CM | POA: Diagnosis not present

## 2019-12-01 DIAGNOSIS — G4733 Obstructive sleep apnea (adult) (pediatric): Secondary | ICD-10-CM | POA: Diagnosis not present

## 2019-12-04 ENCOUNTER — Telehealth: Payer: Self-pay

## 2019-12-04 NOTE — Telephone Encounter (Signed)
Pt is having extreme nausea for weeks and wants to come in for an appt. I know we do not usually see nausea in office. He does not want to do virtual. Please Advise

## 2019-12-05 ENCOUNTER — Inpatient Hospital Stay (HOSPITAL_COMMUNITY)
Admission: EM | Admit: 2019-12-05 | Discharge: 2019-12-12 | DRG: 392 | Disposition: A | Discharge status: Skilled Nursing Facility | Payer: Medicare Other | Attending: Internal Medicine | Admitting: Internal Medicine

## 2019-12-05 ENCOUNTER — Other Ambulatory Visit: Payer: Self-pay

## 2019-12-05 ENCOUNTER — Encounter (HOSPITAL_COMMUNITY): Payer: Self-pay

## 2019-12-05 ENCOUNTER — Emergency Department (HOSPITAL_COMMUNITY): Payer: Medicare Other

## 2019-12-05 DIAGNOSIS — E860 Dehydration: Secondary | ICD-10-CM | POA: Diagnosis not present

## 2019-12-05 DIAGNOSIS — D72829 Elevated white blood cell count, unspecified: Secondary | ICD-10-CM | POA: Diagnosis not present

## 2019-12-05 DIAGNOSIS — Z7401 Bed confinement status: Secondary | ICD-10-CM | POA: Diagnosis not present

## 2019-12-05 DIAGNOSIS — R1110 Vomiting, unspecified: Secondary | ICD-10-CM | POA: Diagnosis not present

## 2019-12-05 DIAGNOSIS — E876 Hypokalemia: Secondary | ICD-10-CM | POA: Diagnosis not present

## 2019-12-05 DIAGNOSIS — G8929 Other chronic pain: Secondary | ICD-10-CM | POA: Diagnosis present

## 2019-12-05 DIAGNOSIS — Z882 Allergy status to sulfonamides status: Secondary | ICD-10-CM | POA: Diagnosis not present

## 2019-12-05 DIAGNOSIS — Z79899 Other long term (current) drug therapy: Secondary | ICD-10-CM | POA: Diagnosis not present

## 2019-12-05 DIAGNOSIS — Z6834 Body mass index (BMI) 34.0-34.9, adult: Secondary | ICD-10-CM

## 2019-12-05 DIAGNOSIS — E669 Obesity, unspecified: Secondary | ICD-10-CM | POA: Diagnosis present

## 2019-12-05 DIAGNOSIS — K5792 Diverticulitis of intestine, part unspecified, without perforation or abscess without bleeding: Secondary | ICD-10-CM

## 2019-12-05 DIAGNOSIS — K429 Umbilical hernia without obstruction or gangrene: Secondary | ICD-10-CM | POA: Diagnosis not present

## 2019-12-05 DIAGNOSIS — Z96651 Presence of right artificial knee joint: Secondary | ICD-10-CM | POA: Diagnosis present

## 2019-12-05 DIAGNOSIS — Z8719 Personal history of other diseases of the digestive system: Secondary | ICD-10-CM

## 2019-12-05 DIAGNOSIS — R262 Difficulty in walking, not elsewhere classified: Secondary | ICD-10-CM | POA: Diagnosis not present

## 2019-12-05 DIAGNOSIS — Z823 Family history of stroke: Secondary | ICD-10-CM

## 2019-12-05 DIAGNOSIS — N184 Chronic kidney disease, stage 4 (severe): Secondary | ICD-10-CM | POA: Diagnosis present

## 2019-12-05 DIAGNOSIS — Z20822 Contact with and (suspected) exposure to covid-19: Secondary | ICD-10-CM | POA: Diagnosis present

## 2019-12-05 DIAGNOSIS — K5732 Diverticulitis of large intestine without perforation or abscess without bleeding: Principal | ICD-10-CM | POA: Diagnosis present

## 2019-12-05 DIAGNOSIS — R0689 Other abnormalities of breathing: Secondary | ICD-10-CM | POA: Diagnosis not present

## 2019-12-05 DIAGNOSIS — M6281 Muscle weakness (generalized): Secondary | ICD-10-CM | POA: Diagnosis not present

## 2019-12-05 DIAGNOSIS — K4020 Bilateral inguinal hernia, without obstruction or gangrene, not specified as recurrent: Secondary | ICD-10-CM | POA: Diagnosis not present

## 2019-12-05 DIAGNOSIS — G4733 Obstructive sleep apnea (adult) (pediatric): Secondary | ICD-10-CM | POA: Diagnosis not present

## 2019-12-05 DIAGNOSIS — R1312 Dysphagia, oropharyngeal phase: Secondary | ICD-10-CM | POA: Diagnosis not present

## 2019-12-05 DIAGNOSIS — Z9114 Patient's other noncompliance with medication regimen: Secondary | ICD-10-CM

## 2019-12-05 DIAGNOSIS — L0390 Cellulitis, unspecified: Secondary | ICD-10-CM | POA: Diagnosis not present

## 2019-12-05 DIAGNOSIS — R112 Nausea with vomiting, unspecified: Secondary | ICD-10-CM | POA: Diagnosis not present

## 2019-12-05 DIAGNOSIS — R110 Nausea: Secondary | ICD-10-CM | POA: Diagnosis not present

## 2019-12-05 DIAGNOSIS — K5790 Diverticulosis of intestine, part unspecified, without perforation or abscess without bleeding: Secondary | ICD-10-CM | POA: Diagnosis not present

## 2019-12-05 DIAGNOSIS — M545 Low back pain: Secondary | ICD-10-CM | POA: Diagnosis present

## 2019-12-05 DIAGNOSIS — M2550 Pain in unspecified joint: Secondary | ICD-10-CM | POA: Diagnosis not present

## 2019-12-05 DIAGNOSIS — R2689 Other abnormalities of gait and mobility: Secondary | ICD-10-CM | POA: Diagnosis not present

## 2019-12-05 DIAGNOSIS — R404 Transient alteration of awareness: Secondary | ICD-10-CM | POA: Diagnosis not present

## 2019-12-05 DIAGNOSIS — N3941 Urge incontinence: Secondary | ICD-10-CM | POA: Diagnosis present

## 2019-12-05 DIAGNOSIS — I129 Hypertensive chronic kidney disease with stage 1 through stage 4 chronic kidney disease, or unspecified chronic kidney disease: Secondary | ICD-10-CM | POA: Diagnosis present

## 2019-12-05 DIAGNOSIS — E039 Hypothyroidism, unspecified: Secondary | ICD-10-CM | POA: Diagnosis not present

## 2019-12-05 DIAGNOSIS — R5381 Other malaise: Secondary | ICD-10-CM | POA: Diagnosis not present

## 2019-12-05 DIAGNOSIS — F319 Bipolar disorder, unspecified: Secondary | ICD-10-CM | POA: Diagnosis present

## 2019-12-05 DIAGNOSIS — N401 Enlarged prostate with lower urinary tract symptoms: Secondary | ICD-10-CM | POA: Diagnosis present

## 2019-12-05 DIAGNOSIS — I10 Essential (primary) hypertension: Secondary | ICD-10-CM | POA: Diagnosis not present

## 2019-12-05 DIAGNOSIS — R531 Weakness: Secondary | ICD-10-CM | POA: Diagnosis not present

## 2019-12-05 DIAGNOSIS — Z743 Need for continuous supervision: Secondary | ICD-10-CM | POA: Diagnosis not present

## 2019-12-05 DIAGNOSIS — S32591A Other specified fracture of right pubis, initial encounter for closed fracture: Secondary | ICD-10-CM | POA: Diagnosis not present

## 2019-12-05 DIAGNOSIS — R2681 Unsteadiness on feet: Secondary | ICD-10-CM | POA: Diagnosis not present

## 2019-12-05 LAB — BASIC METABOLIC PANEL
Anion gap: 17 — ABNORMAL HIGH (ref 5–15)
BUN: 105 mg/dL — ABNORMAL HIGH (ref 8–23)
CO2: 34 mmol/L — ABNORMAL HIGH (ref 22–32)
Calcium: 9.5 mg/dL (ref 8.9–10.3)
Chloride: 83 mmol/L — ABNORMAL LOW (ref 98–111)
Creatinine, Ser: 2.88 mg/dL — ABNORMAL HIGH (ref 0.61–1.24)
GFR calc Af Amer: 23 mL/min — ABNORMAL LOW (ref >60)
GFR calc non Af Amer: 20 mL/min — ABNORMAL LOW (ref >60)
Glucose, Bld: 141 mg/dL — ABNORMAL HIGH (ref 70–99)
Potassium: 2.8 mmol/L — ABNORMAL LOW (ref 3.5–5.1)
Sodium: 134 mmol/L — ABNORMAL LOW (ref 135–145)

## 2019-12-05 LAB — URINALYSIS, ROUTINE W REFLEX MICROSCOPIC
Bilirubin Urine: NEGATIVE
Glucose, UA: NEGATIVE mg/dL
Hgb urine dipstick: NEGATIVE
Ketones, ur: NEGATIVE mg/dL
Leukocytes,Ua: NEGATIVE
Nitrite: NEGATIVE
Protein, ur: NEGATIVE mg/dL
Specific Gravity, Urine: 1.011 (ref 1.005–1.030)
pH: 5 (ref 5.0–8.0)

## 2019-12-05 LAB — CBC
HCT: 41.1 % (ref 39.0–52.0)
Hemoglobin: 14 g/dL (ref 13.0–17.0)
MCH: 32.3 pg (ref 26.0–34.0)
MCHC: 34.1 g/dL (ref 30.0–36.0)
MCV: 94.9 fL (ref 80.0–100.0)
Platelets: 228 10*3/uL (ref 150–400)
RBC: 4.33 MIL/uL (ref 4.22–5.81)
RDW: 11.3 % — ABNORMAL LOW (ref 11.5–15.5)
WBC: 16.8 10*3/uL — ABNORMAL HIGH (ref 4.0–10.5)
nRBC: 0 % (ref 0.0–0.2)

## 2019-12-05 LAB — CBG MONITORING, ED: Glucose-Capillary: 139 mg/dL — ABNORMAL HIGH (ref 70–99)

## 2019-12-05 MED ORDER — ONDANSETRON 4 MG PO TBDP
4.0000 mg | ORAL_TABLET | Freq: Once | ORAL | Status: AC | PRN
  Administered 2019-12-05: 4 mg via ORAL
  Filled 2019-12-05: qty 1

## 2019-12-05 NOTE — ED Triage Notes (Signed)
Pt arrived via EMS, from home, unable to tolerate PO's at home. Approx 2 glasses water a day. Unable to get out of bed at home, only able to move enough to get to bathroom.

## 2019-12-05 NOTE — Telephone Encounter (Signed)
Pt is scheduled for Monday. He did not want an outside test, so I instructed him to call when he gets here and told him he will be entering through the side and not allowed to come through the office. Pt also requested if there is anyway if he can get some nausea medicine until that appt

## 2019-12-05 NOTE — Telephone Encounter (Signed)
  I do not know what ekg change is but would need to recommend emergency room based on this reported ekg abnormality. If he declines- Nausea potential covid symptom- can he get tested at an outside facility before visit?  If not agreeable to outside testing- need n 95 worn, end room, covid test in room, ekg in room. May need blood draw by CMA. Once again if he can get tested ahead of time would be easy.   Please have him urinate before visit as we cannot have him walking through building.

## 2019-12-05 NOTE — ED Notes (Signed)
Pt able to transition to/from wheelchair with only standby assist.

## 2019-12-05 NOTE — ED Notes (Signed)
Pt has 1 set of BC drawn from R-AC, 2 gold tops, 1 blue top, and 1 gray top in main lab if needed

## 2019-12-05 NOTE — Telephone Encounter (Signed)
EMS called and was with PT. They state he is dehydrated and that his ekg was slightly affected, most likely due to his dehydration. Pt told paramedic he was not going to the hospital. Paramedic told me the pt needs to be followed up with as soon as possible. Paramedic states the pt shows no signs of infection or contagious illness- he is unsure where nausea is coming from. I told paramedic we would call pt about a follow up appt asap. Please advise

## 2019-12-06 ENCOUNTER — Encounter (HOSPITAL_COMMUNITY): Payer: Self-pay | Admitting: Emergency Medicine

## 2019-12-06 ENCOUNTER — Emergency Department (HOSPITAL_COMMUNITY): Payer: Medicare Other

## 2019-12-06 DIAGNOSIS — I129 Hypertensive chronic kidney disease with stage 1 through stage 4 chronic kidney disease, or unspecified chronic kidney disease: Secondary | ICD-10-CM | POA: Diagnosis present

## 2019-12-06 DIAGNOSIS — D72829 Elevated white blood cell count, unspecified: Secondary | ICD-10-CM | POA: Diagnosis present

## 2019-12-06 DIAGNOSIS — G8929 Other chronic pain: Secondary | ICD-10-CM | POA: Diagnosis present

## 2019-12-06 DIAGNOSIS — M545 Low back pain: Secondary | ICD-10-CM | POA: Diagnosis present

## 2019-12-06 DIAGNOSIS — F319 Bipolar disorder, unspecified: Secondary | ICD-10-CM | POA: Diagnosis present

## 2019-12-06 DIAGNOSIS — E860 Dehydration: Secondary | ICD-10-CM | POA: Diagnosis present

## 2019-12-06 DIAGNOSIS — N184 Chronic kidney disease, stage 4 (severe): Secondary | ICD-10-CM | POA: Diagnosis present

## 2019-12-06 DIAGNOSIS — G4733 Obstructive sleep apnea (adult) (pediatric): Secondary | ICD-10-CM | POA: Diagnosis present

## 2019-12-06 DIAGNOSIS — Z9114 Patient's other noncompliance with medication regimen: Secondary | ICD-10-CM | POA: Diagnosis not present

## 2019-12-06 DIAGNOSIS — K5792 Diverticulitis of intestine, part unspecified, without perforation or abscess without bleeding: Secondary | ICD-10-CM | POA: Diagnosis present

## 2019-12-06 DIAGNOSIS — E669 Obesity, unspecified: Secondary | ICD-10-CM | POA: Diagnosis present

## 2019-12-06 DIAGNOSIS — E039 Hypothyroidism, unspecified: Secondary | ICD-10-CM | POA: Diagnosis present

## 2019-12-06 DIAGNOSIS — Z79899 Other long term (current) drug therapy: Secondary | ICD-10-CM | POA: Diagnosis not present

## 2019-12-06 DIAGNOSIS — Z20822 Contact with and (suspected) exposure to covid-19: Secondary | ICD-10-CM | POA: Diagnosis present

## 2019-12-06 DIAGNOSIS — Z882 Allergy status to sulfonamides status: Secondary | ICD-10-CM | POA: Diagnosis not present

## 2019-12-06 DIAGNOSIS — Z8719 Personal history of other diseases of the digestive system: Secondary | ICD-10-CM | POA: Diagnosis not present

## 2019-12-06 DIAGNOSIS — Z823 Family history of stroke: Secondary | ICD-10-CM | POA: Diagnosis not present

## 2019-12-06 DIAGNOSIS — Z96651 Presence of right artificial knee joint: Secondary | ICD-10-CM | POA: Diagnosis present

## 2019-12-06 DIAGNOSIS — E876 Hypokalemia: Secondary | ICD-10-CM | POA: Diagnosis present

## 2019-12-06 DIAGNOSIS — Z6834 Body mass index (BMI) 34.0-34.9, adult: Secondary | ICD-10-CM | POA: Diagnosis not present

## 2019-12-06 DIAGNOSIS — N3941 Urge incontinence: Secondary | ICD-10-CM | POA: Diagnosis present

## 2019-12-06 DIAGNOSIS — N401 Enlarged prostate with lower urinary tract symptoms: Secondary | ICD-10-CM | POA: Diagnosis present

## 2019-12-06 DIAGNOSIS — K5732 Diverticulitis of large intestine without perforation or abscess without bleeding: Secondary | ICD-10-CM | POA: Diagnosis present

## 2019-12-06 LAB — CBC WITH DIFFERENTIAL/PLATELET
Abs Immature Granulocytes: 0.17 10*3/uL — ABNORMAL HIGH (ref 0.00–0.07)
Basophils Absolute: 0 10*3/uL (ref 0.0–0.1)
Basophils Relative: 0 %
Eosinophils Absolute: 0.2 10*3/uL (ref 0.0–0.5)
Eosinophils Relative: 1 %
HCT: 38.6 % — ABNORMAL LOW (ref 39.0–52.0)
Hemoglobin: 13 g/dL (ref 13.0–17.0)
Immature Granulocytes: 1 %
Lymphocytes Relative: 10 %
Lymphs Abs: 1.3 10*3/uL (ref 0.7–4.0)
MCH: 32.3 pg (ref 26.0–34.0)
MCHC: 33.7 g/dL (ref 30.0–36.0)
MCV: 96 fL (ref 80.0–100.0)
Monocytes Absolute: 1.3 10*3/uL — ABNORMAL HIGH (ref 0.1–1.0)
Monocytes Relative: 10 %
Neutro Abs: 10 10*3/uL — ABNORMAL HIGH (ref 1.7–7.7)
Neutrophils Relative %: 78 %
Platelets: 181 10*3/uL (ref 150–400)
RBC: 4.02 MIL/uL — ABNORMAL LOW (ref 4.22–5.81)
RDW: 11.4 % — ABNORMAL LOW (ref 11.5–15.5)
WBC: 13 10*3/uL — ABNORMAL HIGH (ref 4.0–10.5)
nRBC: 0 % (ref 0.0–0.2)

## 2019-12-06 LAB — COMPREHENSIVE METABOLIC PANEL
ALT: 17 U/L (ref 0–44)
AST: 20 U/L (ref 15–41)
Albumin: 3.4 g/dL — ABNORMAL LOW (ref 3.5–5.0)
Alkaline Phosphatase: 71 U/L (ref 38–126)
Anion gap: 18 — ABNORMAL HIGH (ref 5–15)
BUN: 95 mg/dL — ABNORMAL HIGH (ref 8–23)
CO2: 28 mmol/L (ref 22–32)
Calcium: 8.8 mg/dL — ABNORMAL LOW (ref 8.9–10.3)
Chloride: 90 mmol/L — ABNORMAL LOW (ref 98–111)
Creatinine, Ser: 2.83 mg/dL — ABNORMAL HIGH (ref 0.61–1.24)
GFR calc Af Amer: 23 mL/min — ABNORMAL LOW (ref >60)
GFR calc non Af Amer: 20 mL/min — ABNORMAL LOW (ref >60)
Glucose, Bld: 92 mg/dL (ref 70–99)
Potassium: 2.7 mmol/L — CL (ref 3.5–5.1)
Sodium: 136 mmol/L (ref 135–145)
Total Bilirubin: 1.1 mg/dL (ref 0.3–1.2)
Total Protein: 6.1 g/dL — ABNORMAL LOW (ref 6.5–8.1)

## 2019-12-06 LAB — SARS CORONAVIRUS 2 BY RT PCR (HOSPITAL ORDER, PERFORMED IN ~~LOC~~ HOSPITAL LAB): SARS Coronavirus 2: NEGATIVE

## 2019-12-06 MED ORDER — PIPERACILLIN-TAZOBACTAM 3.375 G IVPB 30 MIN
3.3750 g | Freq: Once | INTRAVENOUS | Status: AC
  Administered 2019-12-06: 3.375 g via INTRAVENOUS
  Filled 2019-12-06: qty 50

## 2019-12-06 MED ORDER — SODIUM CHLORIDE 0.9 % IV SOLN: INTRAVENOUS | Status: DC

## 2019-12-06 MED ORDER — ONDANSETRON HCL 4 MG PO TABS
4.0000 mg | ORAL_TABLET | Freq: Four times a day (QID) | ORAL | Status: DC | PRN
  Administered 2019-12-10: 4 mg via ORAL
  Filled 2019-12-06: qty 1

## 2019-12-06 MED ORDER — ONDANSETRON 4 MG PO TBDP: 4.0000 mg | ORAL_TABLET | Freq: Three times a day (TID) | ORAL | 0 refills | Status: DC | PRN

## 2019-12-06 MED ORDER — HEPARIN SODIUM (PORCINE) 5000 UNIT/ML IJ SOLN
5000.0000 [IU] | Freq: Three times a day (TID) | INTRAMUSCULAR | Status: DC
  Administered 2019-12-06 – 2019-12-12 (×19): 5000 [IU] via SUBCUTANEOUS
  Filled 2019-12-06 (×18): qty 1

## 2019-12-06 MED ORDER — TRANYLCYPROMINE SULFATE 10 MG PO TABS
20.0000 mg | ORAL_TABLET | Freq: Two times a day (BID) | ORAL | Status: DC
  Administered 2019-12-06 – 2019-12-12 (×12): 20 mg via ORAL
  Filled 2019-12-06 (×12): qty 2

## 2019-12-06 MED ORDER — ONDANSETRON HCL 4 MG/2ML IJ SOLN
4.0000 mg | Freq: Four times a day (QID) | INTRAMUSCULAR | Status: DC | PRN
  Administered 2019-12-06 – 2019-12-07 (×2): 4 mg via INTRAVENOUS
  Filled 2019-12-06 (×2): qty 2

## 2019-12-06 MED ORDER — PIPERACILLIN-TAZOBACTAM IN DEX 2-0.25 GM/50ML IV SOLN
2.2500 g | Freq: Three times a day (TID) | INTRAVENOUS | Status: DC
  Administered 2019-12-06 – 2019-12-08 (×7): 2.25 g via INTRAVENOUS
  Filled 2019-12-06 (×10): qty 50

## 2019-12-06 MED ORDER — POTASSIUM CHLORIDE CRYS ER 20 MEQ PO TBCR
40.0000 meq | EXTENDED_RELEASE_TABLET | Freq: Two times a day (BID) | ORAL | Status: AC
  Administered 2019-12-06 (×2): 40 meq via ORAL
  Filled 2019-12-06 (×2): qty 2

## 2019-12-06 MED ORDER — POTASSIUM CHLORIDE 10 MEQ/100ML IV SOLN
10.0000 meq | Freq: Once | INTRAVENOUS | Status: AC
  Administered 2019-12-06: 10 meq via INTRAVENOUS
  Filled 2019-12-06: qty 100

## 2019-12-06 NOTE — Addendum Note (Signed)
Addended by: [name] on: 12/06/2019 07:51 AM   Modules accepted: Orders

## 2019-12-06 NOTE — Telephone Encounter (Signed)
Really would be easier if he did an outside test ahead of time so we can send him to lab if needed that day (I cannot guarantee we will have someone that can come in and draw blood)  Also nausea can be a sign of kidney disease and he should reach out to kidney doctor. Worsening symptoms seek care in ER  Finally I did send in nausea medicine for him

## 2019-12-06 NOTE — Progress Notes (Signed)
Pt refused cpap for tonight 

## 2019-12-06 NOTE — Telephone Encounter (Signed)
I was worried about him- that's good news he is getting more urgent care

## 2019-12-06 NOTE — H&P (Signed)
History and Physical    [name] LKG:[id number] DOB: [birth year] DOA: 12/05/2019  PCP: [name], MD  Patient coming from: Home.  Chief Complaint: Nausea.  HPI: [name] is a 79 y.o. male with history of chronic kidney disease stage IV, sleep apnea, depression presents to the ER because of ongoing nausea for the last 4 days.  Denies vomiting abdominal pain.  Denies any diarrhea.  ED Course: In the ER patient's labs show leukocytosis of 16.8 with creatinine of 2.8 which appears to be at baseline potassium of 2.8.  EKG shows normal sinus rhythm IVCD Covid test was negative chest x-ray unremarkable.  Given the nausea and leukocytosis patient had a CT abdomen pelvis which showed features concerning for diverticulitis of the descending colon for which patient is started on antibiotic.  Initially was started on fluids.  Review of Systems: As per HPI, rest all negative.   Past Medical History:  Diagnosis Date  . Arthritis    back  . Back pain    reason unknown  . Benign prostatic hyperplasia (BPH) with urinary urge incontinence    sees Dr. Jeffie Pollock   . Bipolar 1 disorder [facility])    sees Dr. Norma Fredrickson  . CKD (chronic kidney disease) stage 4, GFR 15-29 ml/min (HCC)     followed by [facility]  . Depression    takes Parnate daily  . Diverticulitis of colon 05/27/2015  . Essential hypertension 04/11/2007  . Headache(784.0)    occasionally  . Hip fracture ([place]) 09/01/2017   left hip fracture  . History of colon polyps   . Hypothyroidism 10/03/2009   Noted by Dr. Arnoldo Morale- patient states never on medicine- may have been checked due to parnate and abilify   . 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 Joint pain   . Joint swelling   . Osteoporosis    takes Drisdol weekly and Caltrate daily  . Peripheral edema    takes torsemide daily followed by nephrology  . Shingles 05/27/2015  . Sleep apnea    wears c-pap     Past Surgical History:  Procedure Laterality Date  . BACK SURGERY    . CARDIAC CATHETERIZATION    . COLONOSCOPY W/ POLYPECTOMY  03-10-15   per Dr. Havery Moros, adenomatous polyps, repeat in 3 yrs   . HIP FRACTURE SURGERY Left 09/02/2017  . REVERSE SHOULDER ARTHROPLASTY Right 05/22/2013   Procedure: REVERSE SHOULDER ARTHROPLASTY;  Surgeon: Nita Sells, MD;  Location: [place];  Service: Orthopedics;  Laterality: Right;  Right reverse total shoulder  . TONSILLECTOMY    . TOTAL KNEE ARTHROPLASTY Right 12/08/2015   Procedure: TOTAL KNEE ARTHROPLASTY;  Surgeon: Dorna Leitz, MD;  Location: [place];  Service: Orthopedics;  Laterality: Right;     reports that he has never smoked. He has never used smokeless tobacco. He reports that he does not drink alcohol and does not use drugs.  Allergies  Allergen Reactions  . Sulfa Antibiotics Nausea Only    Family History  Problem Relation Age of Onset  . Cancer Mother        unknown type  . Stroke Father   . Cancer Maternal Grandmother   . Appendicitis Paternal Grandfather   . Prostate cancer Brother   . Colon cancer Neg Hx   . Esophageal cancer Neg Hx   . Rectal cancer Neg Hx   . Stomach cancer Neg Hx   . Colon polyps  Neg Hx     Prior to Admission medications   Medication Sig Start Date End Date Taking? Authorizing Provider  acetaminophen (TYLENOL 8 HOUR) 650 MG CR tablet Take 1,300 mg by mouth 2 (two) times daily.     [provider]  amoxicillin-clavulanate (AUGMENTIN) 500-125 MG tablet Take 1 tablet (500 mg total) by mouth 2 (two) times daily. For left ear infection. Adjusted for CKD IV 11/12/19   [name], MD  Calcium-Vitamin D (CALTRATE 600 PLUS-VIT D PO) Take 1 tablet by mouth daily.     [provider]  L-methylfolate Calcium 15 MG TABS Take 15 mg by mouth daily.    [provider]  mirabegron ER (MYRBETRIQ) 25 MG TB24 tablet Take 1 tablet (25 mg total) by mouth daily. 10/03/19   [name], MD  torsemide (DEMADEX) 20 MG tablet Take 1 tablet (20 mg total) by mouth daily. 07/09/19   [name], MD  tranylcypromine (PARNATE) 10 MG tablet Take 30 mg by mouth 2 (two) times daily.     [provider]    Physical Exam: Constitutional: Moderately built and nourished. Vitals:   12/06/19 0000 12/06/19 0030 12/06/19 0100 12/06/19 0300  BP: 102/72 102/71 102/74 113/71  Pulse: 92 88 (!) 107 91  Resp: 17 20 16  (!) 28  Temp:      TempSrc:      SpO2: 93% 92% 93% 100%   Eyes: Anicteric no pallor. ENMT: No discharge from the ears eyes nose or mouth. Neck: No mass felt.  No neck rigidity. Respiratory: No rhonchi or crepitations. Cardiovascular: S1-S2 heard. Abdomen: Soft nontender bowel sounds present. Musculoskeletal: Mild edema of the both lower extremities. Skin: Chronic skin changes. Neurologic: Alert awake oriented to time place and person.  Moves all extremities. Psychiatric: Appears normal.  Normal affect.   Labs on Admission: I have personally reviewed following labs and imaging studies  CBC: Recent Labs  Lab 12/05/19 1924  WBC 16.8*  HGB 14.0  HCT 41.1  MCV 94.9  PLT 161   Basic Metabolic Panel: Recent Labs  Lab 12/05/19 1924  NA 134*  K 2.8*  CL 83*  CO2 34*  GLUCOSE 141*  BUN 105*  CREATININE 2.88*  CALCIUM 9.5   GFR: CrCl cannot be calculated (Unknown ideal weight.). Liver Function Tests: No results for input(s): AST, ALT, ALKPHOS, BILITOT, PROT, ALBUMIN in the last 168 hours. No results for input(s): LIPASE, AMYLASE in the last 168 hours. No results for input(s): AMMONIA in the last 168 hours. Coagulation Profile: No results for input(s): INR, PROTIME in the last 168 hours. Cardiac Enzymes: No results for input(s): CKTOTAL, CKMB, CKMBINDEX, TROPONINI in the last 168 hours. BNP (last 3 results) Recent Labs    01/29/19 1625 06/19/19 1628  PROBNP 103 190   HbA1C: No results for input(s): HGBA1C in the last 72  hours. CBG: Recent Labs  Lab 12/05/19 1922  GLUCAP 139*   Lipid Profile: No results for input(s): CHOL, HDL, LDLCALC, TRIG, CHOLHDL, LDLDIRECT in the last 72 hours. Thyroid Function Tests: No results for input(s): TSH, T4TOTAL, FREET4, T3FREE, THYROIDAB in the last 72 hours. Anemia Panel: No results for input(s): VITAMINB12, FOLATE, FERRITIN, TIBC, IRON, RETICCTPCT in the last 72 hours. Urine analysis:    Component Value Date/Time   COLORURINE YELLOW 12/05/2019 2300   APPEARANCEUR CLEAR 12/05/2019 2300   LABSPEC 1.011 12/05/2019 2300   PHURINE 5.0 12/05/2019 2300   GLUCOSEU NEGATIVE 12/05/2019 2300   HGBUR NEGATIVE  12/05/2019 2300   HGBUR negative 10/03/2009 1528   BILIRUBINUR NEGATIVE 12/05/2019 2300   BILIRUBINUR Negative 07/09/2019 1121   KETONESUR NEGATIVE 12/05/2019 2300   PROTEINUR NEGATIVE 12/05/2019 2300   UROBILINOGEN 0.2 07/09/2019 1121   UROBILINOGEN 0.2 10/03/2009 1528   NITRITE NEGATIVE 12/05/2019 2300   LEUKOCYTESUR NEGATIVE 12/05/2019 2300   Sepsis Labs: @LABRCNTIP (procalcitonin:4,lacticidven:4) )No results found for this or any previous visit (from the past 240 hour(s)).   Radiological Exams on Admission: DG Chest Portable 1 View  Result Date: 12/05/2019 CLINICAL DATA:  Weakness, nausea and vomiting for 5 days EXAM: PORTABLE CHEST 1 VIEW COMPARISON:  06/20/2019 FINDINGS: Single frontal view of the chest demonstrates a stable cardiac silhouette. No airspace disease, effusion, or pneumothorax. No acute displaced fractures. Stable partially visualized right shoulder arthroplasty. IMPRESSION: 1. No acute intrathoracic process. Electronically Signed   By: Randa Ngo M.D.   On: 12/05/2019 23:57   CT Renal Stone Study  Result Date: 12/06/2019 CLINICAL DATA:  Flank pain EXAM: CT ABDOMEN AND PELVIS WITHOUT CONTRAST TECHNIQUE: Multidetector CT imaging of the abdomen and pelvis was performed following the standard protocol without IV contrast. COMPARISON:  CT  09/24/2008 FINDINGS: Lower chest: Lung bases demonstrate no acute consolidation or effusion. Normal cardiac size. Trace pericardial effusion Hepatobiliary: Small calcified granuloma. Mildly distended gallbladder with small calcified stones. No surrounding inflammatory process. No biliary dilatation. Pancreas: Unremarkable. No pancreatic ductal dilatation or surrounding inflammatory changes. Spleen: Normal in size without focal abnormality. Adrenals/Urinary Tract: Adrenal glands are normal. The kidneys show no hydronephrosis. 13 mm hyperdense lesion mid pole right kidney. 10 mm hyperdense lesion upper pole left kidney. 4.9 cm fat density mass in the upper pole right kidney. The bladder is normal Stomach/Bowel: The stomach is nonenlarged. No dilated small bowel. Extensive diverticular disease of the colon. Mild focal inflammatory changes at the proximal descending colon, series 2, image number 29 and coronal series 4 image number 67 and 68, consistent with acute diverticulitis. No perforation or abscess. Negative appendix. Vascular/Lymphatic: Mild aortic atherosclerosis. No aneurysm. No suspicious nodes. Reproductive: Prostate is unremarkable. Other: Negative for free air or free fluid. Left greater than right fat containing inguinal hernias. Small fat containing umbilical and periumbilical hernia Musculoskeletal: Posterior rods and fixating screws with interbody device at L4-L5. Chronic fracture deformity of the right inferior pubic ramus. Intramedullary rod within the left femur. IMPRESSION: 1. Negative for hydronephrosis or ureteral stone. 2. Extensive diverticular disease of the colon. Mild focal inflammatory change at the proximal descending colon, consistent with acute diverticulitis. No perforation or abscess. 3. 4.9 cm fat density mass in the upper pole of the right kidney, consistent with angiomyolipoma. There are several hyperdense lesions within the kidneys, suspect that these represent hemorrhagic or  proteinaceous cysts. Further evaluation limited without contrast. Nonemergent renal CT or MRI follow-up could be obtained to further evaluate. 4. Fat containing left greater than right inguinal hernias. Small fat containing umbilical and periumbilical hernias. Aortic Atherosclerosis (ICD10-I70.0). Electronically Signed   By: Donavan Foil M.D.   On: 12/06/2019 00:37    EKG: Independently reviewed.  Normal sinus rhythm IVCD.  Assessment/Plan Principal Problem:   Acute diverticulitis Active Problems:   CKD (chronic kidney disease), stage IV (HCC)   OSA (obstructive sleep apnea)   Hypokalemia    1. Acute diverticulitis for which patient is on empiric antibiotics.  Closely observe. 2. Nausea could be from diverticulitis.  Patient has been placed on diet we will closely observe for any further worsening. 3. Hypokalemia could  be from poor oral intake and diuretic use.  Will hold off patient's torsemide for now and replace potassium and check metabolic panel.  Check magnesium with next blood draw. 4. Sleep apnea on CPAP. 5. Chronic kidney disease stage IV creatinine appears to be at baseline. 6. History of chronic bilateral lower extremity edema which appears to be chronic on torsemide.  Will hold off for now. 7. History of depression Home medications needs to be verified.  Patient's home medications needs to be verified.   DVT prophylaxis: Heparin. Code Status: Full code. Family Communication: Discussed with patient. Disposition Plan: Home. Consults called: None. Admission status: Observation.   Rise Patience MD [facility] Pager [phone].  If 7PM-7AM, please contact night-coverage [email or web address] Password [facility]  12/06/2019, 3:43 AM

## 2019-12-06 NOTE — Telephone Encounter (Signed)
Patient is currently at [facility].

## 2019-12-06 NOTE — Progress Notes (Signed)
Pharmacy Antibiotic Note  [name] is a 79 y.o. male admitted on 12/05/2019 with diverticulitis.  Pharmacy has been consulted for Zosyn dosing.  Plan: Zosyn 2.25gm IV q8h Follow renal function  Weight: 85.3 kg (188 lb)  Temp (24hrs), Avg:98.5 F (36.9 C), Min:97.9 F (36.6 C), Max:99 F (37.2 C)  Recent Labs  Lab 12/05/19 1924  WBC 16.8*  CREATININE 2.88*    Estimated Creatinine Clearance: 19.7 mL/min (A) (by C-G formula based on SCr of 2.88 mg/dL (H)).    Allergies  Allergen Reactions  . Sulfa Antibiotics Nausea Only    Antimicrobials this admission: 9/16 Zosyn >>     Dose adjustments this admission:   Microbiology results: 9/16 Covid: negative  Thank you for allowing pharmacy to be a part of this patient's care.  Everette Rank, PharmD 12/06/2019 4:45 AM

## 2019-12-06 NOTE — Progress Notes (Signed)
PROGRESS NOTE  Brief Narrative: [name] is a 78 y.o. male with a history of stage IV CKD, OSA, and depression who presented to the ED with persistent and worsening nausea found to have leukocytosis (WBC 16.8k) and abdominal tenderness on exam prompting CT A/P revealing significant diverticulosis with inflammation along the proximal descending colon. Zosyn, antiemetics, and IV fluids were started, hypokalemia supplemented, and he was admitted this morning for acute diverticulitis.   Subjective: Nausea improved with medications, eager to eat. Denies fever, but does feel systemically sick. Abdomen feels crampy but he denies "pain."   Objective: BP (!) 116/55   Pulse 86   Temp 97.6 F (36.4 C)   Resp 15   Wt 85.3 kg   SpO2 97%   BMI 34.39 kg/m   Gen: Nontoxic Pulm: Clear and nonlabored  CV: RRR, no murmur, no JVD, +symmetric nontender LE edema GI: Soft, mildly tender to palpation on L, ND, +BS  Neuro: Alert and oriented. No focal deficits. Skin: No rashes, lesions or ulcers  Assessment & Plan: Principal Problem:   Acute diverticulitis Active Problems:   CKD (chronic kidney disease), stage IV (HCC)   OSA (obstructive sleep apnea)   Hypokalemia  Acute diverticulitis: Mild along proximal descending colon. No perforation or abscess. - Continue zosyn, leukocytosis improving.  - Start diet, can advance as tolerated.   Hypokalemia: Poor po intake, torsemide.  - Supplement in IVF and monitor with Mg in AM  Stage IV CKD: SCr around his baseline, appears roughly euvolemic at this time.  - Judicious IV fluids, may stop this PM depending on diet tolerance. Holding torsemide for today, will monitor volume status in AM. - Avoiding nephrotoxins, particularly IV contrast.  Suspected renal cysts: On CT 9/16: There are several hyperdense lesions within the kidneys, suspect that these represent hemorrhagic or proteinaceous cysts. Further evaluation limited without contrast. Nonemergent  renal CT or MRI follow-up could be obtained to further evaluate.  OSA:  - CPAP qHS  Depression:  - Continue tranylcypromine   Patrecia Pour, MD Pager on amion 12/06/2019, 3:57 PM

## 2019-12-06 NOTE — ED Notes (Signed)
Date and time results received: 12/06/19 0729   Test: K+ Critical Value: 2.7  Name of Provider Notified: Grunz,MD  Orders Received? Or Actions Taken?:See orders

## 2019-12-06 NOTE — ED Notes (Addendum)
Pt trialed on room air, pts O2 sats decreased to 84% on room air.  Pt placed back on 2L O2 via [place].  O2 increased to 97%. Pt reports wearing no home O2. Will continue to monitor.

## 2019-12-06 NOTE — ED Provider Notes (Signed)
[facility] DEPT Provider Note   CSN: [id number] Arrival date & time: 12/05/19  1847     History Chief Complaint  Patient presents with   Weakness    [name] is a 79 y.o. male.  The history is provided by the patient.  Weakness Severity:  Severe Onset quality:  Gradual Duration:  1 week Timing:  Constant Progression:  Unchanged Chronicity:  New Context: dehydration   Relieved by:  Nothing Worsened by:  Nothing Ineffective treatments:  None tried Associated symptoms: nausea   Associated symptoms: no arthralgias, no chest pain, no diarrhea, no fever, no shortness of breath and no vomiting   Associated symptoms comment:  Global weakness  Nausea:    Severity:  Moderate   Onset quality:  Gradual   Duration:  1 week   Timing:  Constant   Progression:  Unchanged Risk factors: no anemia        Past Medical History:  Diagnosis Date   Arthritis    back   Back pain    reason unknown   Benign prostatic hyperplasia (BPH) with urinary urge incontinence    sees Dr. Jeffie Pollock    Bipolar 1 disorder [facility])    sees Dr. Norma Fredrickson   CKD (chronic kidney disease) stage 4, GFR 15-29 ml/min ([place])     followed by [facility]   Depression    takes Parnate daily   Diverticulitis of colon 05/27/2015   Essential hypertension 04/11/2007   Headache(784.0)    occasionally   Hip fracture ([place]) 09/01/2017   left hip fracture   History of colon polyps    Hypothyroidism 10/03/2009   Noted by Dr. Arnoldo Morale- patient states never on medicine- may have been checked due to parnate and abilify    Internal thrombosed hemorrhoids 07/05/2008   Qualifier: Diagnosis of  By: Arnoldo Morale MD, Balinda Quails    Joint pain    Joint swelling    Osteoporosis    takes Drisdol weekly and Caltrate daily   Peripheral edema    takes torsemide daily followed by nephrology   Shingles 05/27/2015   Sleep apnea    wears c-pap    Patient Active Problem List    Diagnosis Date Noted   Bowel incontinence 11/12/2019   Dysphagia 07/19/2019   Anemia due to stage 4 chronic kidney disease ([place]) 03/24/2019   Wound of left lower extremity 03/24/2019   Weakness 03/24/2019   Paroxysmal tachycardia ([facility]) 12/05/2018   Spondylolisthesis of lumbar region 26/71/2458   Diastolic dysfunction 09/98/3382   Dyspnea 11/11/2017   Leukocytosis 09/04/2017   History of fracture of left hip 09/01/2017   Senile purpura ([place]) 06/22/2017   Osteoarthritis of spine 03/21/2017   Chronic low back pain with degenerative lumbar spinal stenosis-minimal improvement with surgery late 2019 03/08/2017   Hypersomnolence 08/20/2016   Gout 07/07/2016   Other constipation 01/13/2016   Primary osteoarthritis of right knee 12/08/2015   Fatty liver 10/09/2014   Gallstones 10/09/2014   Bilateral lower extremity edema 09/25/2014   Chronic venous insufficiency 08/29/2013   Right shoulder pain 08/29/2013   Proximal humerus fracture 05/22/2013   Neuropathy 01/20/2011   OSA (obstructive sleep apnea) 10/30/2008   Osteoporosis 09/25/2008   CKD (chronic kidney disease), stage IV ([place]) 03/01/2008   Essential hypertension 04/11/2007   Mild depressed bipolar I disorder ([place]) 09/21/2006    Past Surgical History:  Procedure Laterality Date   BACK SURGERY     CARDIAC CATHETERIZATION     COLONOSCOPY W/ POLYPECTOMY  03-10-15   per Dr. Havery Moros, adenomatous polyps, repeat in 3 yrs    HIP FRACTURE SURGERY Left 09/02/2017   REVERSE SHOULDER ARTHROPLASTY Right 05/22/2013   Procedure: REVERSE SHOULDER ARTHROPLASTY;  Surgeon: Nita Sells, MD;  Location: [facility];  Service: Orthopedics;  Laterality: Right;  Right reverse total shoulder   TONSILLECTOMY     TOTAL KNEE ARTHROPLASTY Right 12/08/2015   Procedure: TOTAL KNEE ARTHROPLASTY;  Surgeon: Dorna Leitz, MD;  Location: [place];  Service: Orthopedics;  Laterality: Right;       Family History  Problem  Relation Age of Onset   Cancer Mother        unknown type   Stroke Father    Cancer Maternal Grandmother    Appendicitis Paternal Grandfather    Prostate cancer Brother    Colon cancer Neg Hx    Esophageal cancer Neg Hx    Rectal cancer Neg Hx    Stomach cancer Neg Hx    Colon polyps Neg Hx     Social History   Tobacco Use   Smoking status: Never Smoker   Smokeless tobacco: Never Used  Vaping Use   Vaping Use: Never used  Substance Use Topics   Alcohol use: No    Alcohol/week: 0.0 standard drinks   Drug use: No    Home Medications Prior to Admission medications   Medication Sig Start Date End Date Taking? Authorizing Provider  acetaminophen (TYLENOL 8 HOUR) 650 MG CR tablet Take 1,300 mg by mouth 2 (two) times daily.     [provider]  amoxicillin-clavulanate (AUGMENTIN) 500-125 MG tablet Take 1 tablet (500 mg total) by mouth 2 (two) times daily. For left ear infection. Adjusted for CKD IV 11/12/19   [name], MD  Calcium-Vitamin D (CALTRATE 600 PLUS-VIT D PO) Take 1 tablet by mouth daily.     [provider]  L-methylfolate Calcium 15 MG TABS Take 15 mg by mouth daily.    [provider]  mirabegron ER (MYRBETRIQ) 25 MG TB24 tablet Take 1 tablet (25 mg total) by mouth daily. 10/03/19   [name], MD  torsemide (DEMADEX) 20 MG tablet Take 1 tablet (20 mg total) by mouth daily. 07/09/19   [name], MD  tranylcypromine (PARNATE) 10 MG tablet Take 30 mg by mouth 2 (two) times daily.     [provider]    Allergies    Sulfa antibiotics  Review of Systems   Review of Systems  Constitutional: Negative for fever.  HENT: Negative for congestion.   Eyes: Negative for visual disturbance.  Respiratory: Negative for shortness of breath.   Cardiovascular: Negative for chest pain.  Gastrointestinal: Positive for nausea. Negative for diarrhea and vomiting.  Genitourinary: Negative for difficulty urinating.   Musculoskeletal: Negative for arthralgias.  Neurological: Positive for weakness.  Psychiatric/Behavioral: Negative for agitation.  All other systems reviewed and are negative.   Physical Exam Updated Vital Signs BP 102/72    Pulse 92    Temp 97.9 F (36.6 C) (Oral)    Resp 17    SpO2 93%   Physical Exam Vitals and nursing note reviewed.  Constitutional:      General: He is not in acute distress.    Appearance: Normal appearance.  HENT:     Head: Normocephalic and atraumatic.     Nose: Nose normal.  Eyes:     Conjunctiva/sclera: Conjunctivae normal.     Pupils: Pupils are equal, round, and reactive to light.  Cardiovascular:     Rate and Rhythm: Normal rate and regular rhythm.     Pulses: Normal pulses.     Heart sounds: Normal heart sounds.  Pulmonary:     Effort: Pulmonary effort is normal.     Breath sounds: Normal breath sounds.  Abdominal:     General: Abdomen is flat. Bowel sounds are normal.     Palpations: Abdomen is soft.     Tenderness: There is no abdominal tenderness. There is no guarding.  Musculoskeletal:        General: Normal range of motion.     Cervical back: Normal range of motion and neck supple.  Skin:    General: Skin is warm and dry.     Capillary Refill: Capillary refill takes less than 2 seconds.  Neurological:     General: No focal deficit present.     Mental Status: He is alert and oriented to person, place, and time.     Deep Tendon Reflexes: Reflexes normal.  Psychiatric:        Mood and Affect: Mood normal.        Behavior: Behavior normal.     ED Results / Procedures / Treatments   Labs (all labs ordered are listed, but only abnormal results are displayed) Results for orders placed or performed during the hospital encounter of 12/05/19  CBC  Result Value Ref Range   WBC 16.8 (H) 4.0 - 10.5 K/uL   RBC 4.33 4.22 - 5.81 MIL/uL   Hemoglobin 14.0 13.0 - 17.0 g/dL   HCT 41.1 39 - 52 %   MCV 94.9 80.0 - 100.0 fL   MCH 32.3 26.0 -  34.0 pg   MCHC 34.1 30.0 - 36.0 g/dL   RDW 11.3 (L) 11.5 - 15.5 %   Platelets 228 150 - 400 K/uL   nRBC 0.0 0.0 - 0.2 %  Basic metabolic panel  Result Value Ref Range   Sodium 134 (L) 135 - 145 mmol/L   Potassium 2.8 (L) 3.5 - 5.1 mmol/L   Chloride 83 (L) 98 - 111 mmol/L   CO2 34 (H) 22 - 32 mmol/L   Glucose, Bld 141 (H) 70 - 99 mg/dL   BUN 105 (H) 8 - 23 mg/dL   Creatinine, Ser 2.88 (H) 0.61 - 1.24 mg/dL   Calcium 9.5 8.9 - 10.3 mg/dL   GFR calc non Af Amer 20 (L) >60 mL/min   GFR calc Af Amer 23 (L) >60 mL/min   Anion gap 17 (H) 5 - 15  Urinalysis, Routine w reflex microscopic  Result Value Ref Range   Color, Urine YELLOW YELLOW   APPearance CLEAR CLEAR   Specific Gravity, Urine 1.011 1.005 - 1.030   pH 5.0 5.0 - 8.0   Glucose, UA NEGATIVE NEGATIVE mg/dL   Hgb urine dipstick NEGATIVE NEGATIVE   Bilirubin Urine NEGATIVE NEGATIVE   Ketones, ur NEGATIVE NEGATIVE mg/dL   Protein, ur NEGATIVE NEGATIVE mg/dL   Nitrite NEGATIVE NEGATIVE   Leukocytes,Ua NEGATIVE NEGATIVE  CBG monitoring, ED  Result Value Ref Range   Glucose-Capillary 139 (H) 70 - 99 mg/dL   DG Chest Portable 1 View  Result Date: 12/05/2019 CLINICAL DATA:  Weakness, nausea and vomiting for 5 days EXAM: PORTABLE CHEST 1 VIEW COMPARISON:  06/20/2019 FINDINGS: Single frontal view of the chest demonstrates a stable cardiac silhouette. No airspace disease, effusion, or pneumothorax. No acute displaced fractures. Stable partially visualized right shoulder arthroplasty. IMPRESSION: 1. No acute intrathoracic process. Electronically Signed  By: Randa Ngo M.D.   On: 12/05/2019 23:57   CT Renal Stone Study  Result Date: 12/06/2019 CLINICAL DATA:  Flank pain EXAM: CT ABDOMEN AND PELVIS WITHOUT CONTRAST TECHNIQUE: Multidetector CT imaging of the abdomen and pelvis was performed following the standard protocol without IV contrast. COMPARISON:  CT 09/24/2008 FINDINGS: Lower chest: Lung bases demonstrate no acute  consolidation or effusion. Normal cardiac size. Trace pericardial effusion Hepatobiliary: Small calcified granuloma. Mildly distended gallbladder with small calcified stones. No surrounding inflammatory process. No biliary dilatation. Pancreas: Unremarkable. No pancreatic ductal dilatation or surrounding inflammatory changes. Spleen: Normal in size without focal abnormality. Adrenals/Urinary Tract: Adrenal glands are normal. The kidneys show no hydronephrosis. 13 mm hyperdense lesion mid pole right kidney. 10 mm hyperdense lesion upper pole left kidney. 4.9 cm fat density mass in the upper pole right kidney. The bladder is normal Stomach/Bowel: The stomach is nonenlarged. No dilated small bowel. Extensive diverticular disease of the colon. Mild focal inflammatory changes at the proximal descending colon, series 2, image number 29 and coronal series 4 image number 67 and 68, consistent with acute diverticulitis. No perforation or abscess. Negative appendix. Vascular/Lymphatic: Mild aortic atherosclerosis. No aneurysm. No suspicious nodes. Reproductive: Prostate is unremarkable. Other: Negative for free air or free fluid. Left greater than right fat containing inguinal hernias. Small fat containing umbilical and periumbilical hernia Musculoskeletal: Posterior rods and fixating screws with interbody device at L4-L5. Chronic fracture deformity of the right inferior pubic ramus. Intramedullary rod within the left femur. IMPRESSION: 1. Negative for hydronephrosis or ureteral stone. 2. Extensive diverticular disease of the colon. Mild focal inflammatory change at the proximal descending colon, consistent with acute diverticulitis. No perforation or abscess. 3. 4.9 cm fat density mass in the upper pole of the right kidney, consistent with angiomyolipoma. There are several hyperdense lesions within the kidneys, suspect that these represent hemorrhagic or proteinaceous cysts. Further evaluation limited without contrast.  Nonemergent renal CT or MRI follow-up could be obtained to further evaluate. 4. Fat containing left greater than right inguinal hernias. Small fat containing umbilical and periumbilical hernias. Aortic Atherosclerosis (ICD10-I70.0). Electronically Signed   By: Donavan Foil M.D.   On: 12/06/2019 00:37    EKG EKG Interpretation  Date/Time:  Wednesday December 05 2019 19:19:05 EDT Ventricular Rate:  94 PR Interval:    QRS Duration: 127 QT Interval:  412 QTC Calculation: 516 R Axis:   60 Text Interpretation: Sinus rhythm Nonspecific intraventricular conduction delay Nonspecific T abnormalities, diffuse leads QRS wide and downslope 12 Lead; [place] Confirmed by Davonna Belling [phone]) on 12/05/2019 7:26:07 PM   Radiology DG Chest Portable 1 View  Result Date: 12/05/2019 CLINICAL DATA:  Weakness, nausea and vomiting for 5 days EXAM: PORTABLE CHEST 1 VIEW COMPARISON:  06/20/2019 FINDINGS: Single frontal view of the chest demonstrates a stable cardiac silhouette. No airspace disease, effusion, or pneumothorax. No acute displaced fractures. Stable partially visualized right shoulder arthroplasty. IMPRESSION: 1. No acute intrathoracic process. Electronically Signed   By: Randa Ngo M.D.   On: 12/05/2019 23:57   CT Renal Stone Study  Result Date: 12/06/2019 CLINICAL DATA:  Flank pain EXAM: CT ABDOMEN AND PELVIS WITHOUT CONTRAST TECHNIQUE: Multidetector CT imaging of the abdomen and pelvis was performed following the standard protocol without IV contrast. COMPARISON:  CT 09/24/2008 FINDINGS: Lower chest: Lung bases demonstrate no acute consolidation or effusion. Normal cardiac size. Trace pericardial effusion Hepatobiliary: Small calcified granuloma. Mildly distended gallbladder with small calcified stones. No surrounding inflammatory process. No biliary dilatation.  Pancreas: Unremarkable. No pancreatic ductal dilatation or surrounding inflammatory changes. Spleen: Normal in size without focal  abnormality. Adrenals/Urinary Tract: Adrenal glands are normal. The kidneys show no hydronephrosis. 13 mm hyperdense lesion mid pole right kidney. 10 mm hyperdense lesion upper pole left kidney. 4.9 cm fat density mass in the upper pole right kidney. The bladder is normal Stomach/Bowel: The stomach is nonenlarged. No dilated small bowel. Extensive diverticular disease of the colon. Mild focal inflammatory changes at the proximal descending colon, series 2, image number 29 and coronal series 4 image number 67 and 68, consistent with acute diverticulitis. No perforation or abscess. Negative appendix. Vascular/Lymphatic: Mild aortic atherosclerosis. No aneurysm. No suspicious nodes. Reproductive: Prostate is unremarkable. Other: Negative for free air or free fluid. Left greater than right fat containing inguinal hernias. Small fat containing umbilical and periumbilical hernia Musculoskeletal: Posterior rods and fixating screws with interbody device at L4-L5. Chronic fracture deformity of the right inferior pubic ramus. Intramedullary rod within the left femur. IMPRESSION: 1. Negative for hydronephrosis or ureteral stone. 2. Extensive diverticular disease of the colon. Mild focal inflammatory change at the proximal descending colon, consistent with acute diverticulitis. No perforation or abscess. 3. 4.9 cm fat density mass in the upper pole of the right kidney, consistent with angiomyolipoma. There are several hyperdense lesions within the kidneys, suspect that these represent hemorrhagic or proteinaceous cysts. Further evaluation limited without contrast. Nonemergent renal CT or MRI follow-up could be obtained to further evaluate. 4. Fat containing left greater than right inguinal hernias. Small fat containing umbilical and periumbilical hernias. Aortic Atherosclerosis (ICD10-I70.0). Electronically Signed   By: Donavan Foil M.D.   On: 12/06/2019 00:37    Procedures Procedures (including critical care  time)  Medications Ordered in ED Medications  piperacillin-tazobactam (ZOSYN) IVPB 3.375 g (has no administration in time range)  0.9 %  sodium chloride infusion (has no administration in time range)  ondansetron (ZOFRAN-ODT) disintegrating tablet 4 mg (4 mg Oral Given 12/05/19 1924)    ED Course  I have reviewed the triage vital signs and the nursing notes.  Pertinent labs & imaging results that were available during my care of the patient were reviewed by me and considered in my medical decision making (see chart for details).    Admit for dehydration and diverticulitis  Final Clinical Impression(s) / ED Diagnoses Final diagnoses:  Diverticulitis    Admit to medicine    Leonda Cristo, MD 12/06/19 [id number]

## 2019-12-07 DIAGNOSIS — G4733 Obstructive sleep apnea (adult) (pediatric): Secondary | ICD-10-CM

## 2019-12-07 LAB — CBC WITH DIFFERENTIAL/PLATELET
Abs Immature Granulocytes: 0.18 10*3/uL — ABNORMAL HIGH (ref 0.00–0.07)
Basophils Absolute: 0 10*3/uL (ref 0.0–0.1)
Basophils Relative: 0 %
Eosinophils Absolute: 0.2 10*3/uL (ref 0.0–0.5)
Eosinophils Relative: 1 %
HCT: 36.1 % — ABNORMAL LOW (ref 39.0–52.0)
Hemoglobin: 11.9 g/dL — ABNORMAL LOW (ref 13.0–17.0)
Immature Granulocytes: 1 %
Lymphocytes Relative: 10 %
Lymphs Abs: 1.3 10*3/uL (ref 0.7–4.0)
MCH: 32.3 pg (ref 26.0–34.0)
MCHC: 33 g/dL (ref 30.0–36.0)
MCV: 98.1 fL (ref 80.0–100.0)
Monocytes Absolute: 1.4 10*3/uL — ABNORMAL HIGH (ref 0.1–1.0)
Monocytes Relative: 11 %
Neutro Abs: 10.5 10*3/uL — ABNORMAL HIGH (ref 1.7–7.7)
Neutrophils Relative %: 77 %
Platelets: 163 10*3/uL (ref 150–400)
RBC: 3.68 MIL/uL — ABNORMAL LOW (ref 4.22–5.81)
RDW: 11.6 % (ref 11.5–15.5)
WBC: 13.6 10*3/uL — ABNORMAL HIGH (ref 4.0–10.5)
nRBC: 0 % (ref 0.0–0.2)

## 2019-12-07 LAB — COMPREHENSIVE METABOLIC PANEL
ALT: 17 U/L (ref 0–44)
AST: 18 U/L (ref 15–41)
Albumin: 2.9 g/dL — ABNORMAL LOW (ref 3.5–5.0)
Alkaline Phosphatase: 66 U/L (ref 38–126)
Anion gap: 11 (ref 5–15)
BUN: 79 mg/dL — ABNORMAL HIGH (ref 8–23)
CO2: 33 mmol/L — ABNORMAL HIGH (ref 22–32)
Calcium: 8.7 mg/dL — ABNORMAL LOW (ref 8.9–10.3)
Chloride: 95 mmol/L — ABNORMAL LOW (ref 98–111)
Creatinine, Ser: 3 mg/dL — ABNORMAL HIGH (ref 0.61–1.24)
GFR calc Af Amer: 22 mL/min — ABNORMAL LOW (ref >60)
GFR calc non Af Amer: 19 mL/min — ABNORMAL LOW (ref >60)
Glucose, Bld: 114 mg/dL — ABNORMAL HIGH (ref 70–99)
Potassium: 3.1 mmol/L — ABNORMAL LOW (ref 3.5–5.1)
Sodium: 139 mmol/L (ref 135–145)
Total Bilirubin: 0.9 mg/dL (ref 0.3–1.2)
Total Protein: 5.9 g/dL — ABNORMAL LOW (ref 6.5–8.1)

## 2019-12-07 LAB — MAGNESIUM: Magnesium: 2.8 mg/dL — ABNORMAL HIGH (ref 1.7–2.4)

## 2019-12-07 MED ORDER — ACETAMINOPHEN 325 MG PO TABS
650.0000 mg | ORAL_TABLET | ORAL | Status: DC | PRN
  Administered 2019-12-07 – 2019-12-11 (×6): 650 mg via ORAL
  Filled 2019-12-07 (×6): qty 2

## 2019-12-07 MED ORDER — ACETAMINOPHEN 325 MG PO TABS
650.0000 mg | ORAL_TABLET | Freq: Once | ORAL | Status: AC
  Administered 2019-12-07: 650 mg via ORAL
  Filled 2019-12-07: qty 2

## 2019-12-07 MED ORDER — POTASSIUM CHLORIDE CRYS ER 20 MEQ PO TBCR
40.0000 meq | EXTENDED_RELEASE_TABLET | Freq: Two times a day (BID) | ORAL | Status: AC
  Administered 2019-12-07 (×2): 40 meq via ORAL
  Filled 2019-12-07 (×2): qty 2

## 2019-12-07 MED ORDER — SORBITOL 70 % SOLN: 30.0000 mL | Freq: Every day | Status: DC | PRN

## 2019-12-07 MED ORDER — BISACODYL 5 MG PO TBEC
10.0000 mg | DELAYED_RELEASE_TABLET | Freq: Every day | ORAL | Status: DC | PRN
  Administered 2019-12-07: 10 mg via ORAL
  Filled 2019-12-07: qty 2

## 2019-12-07 MED ORDER — GABAPENTIN 100 MG PO CAPS
200.0000 mg | ORAL_CAPSULE | Freq: Once | ORAL | Status: AC
  Administered 2019-12-07: 200 mg via ORAL
  Filled 2019-12-07: qty 2

## 2019-12-07 NOTE — Progress Notes (Signed)
PROGRESS NOTE  [name]  WCH:[id number] DOB: [birth year] DOA: 12/05/2019 PCP: [name], MD   Brief Narrative: [name] is a 79 y.o. male with a history of stage IV CKD, OSA, and depression who presented to the ED with persistent and worsening nausea found to have leukocytosis (WBC 16.8k) and abdominal tenderness on exam prompting CT A/P revealing extensive diverticulosis with inflammation along the proximal descending colon. Zosyn, antiemetics, and IV fluids were started, hypokalemia supplemented, and he was admitted for acute diverticulitis.   Assessment & Plan: Principal Problem:   Acute diverticulitis Active Problems:   CKD (chronic kidney disease), stage IV (HCC)   OSA (obstructive sleep apnea)   Hypokalemia  Acute diverticulitis: Mild along proximal descending colon. No perforation or abscess. - Continue zosyn. - Advance diet.  Hypokalemia: Poor po intake, torsemide.  - Supplement PO. Mg elevated   Stage IV CKD: SCr near baseline, slightly rising with decreasing prerenal-appearing BUN. - Will continue to hold both IV fluids and torsemide for now. Venous insufficiency makes volume evaluation difficult, though no crackles on exam.  - Avoiding nephrotoxins, particularly IV contrast.  Suspected renal cysts: On CT 9/16: There are several hyperdense lesions within the kidneys, suspect that these represent hemorrhagic or proteinaceous cysts. Further evaluation limited without contrast. Nonemergent renal CT or MRI follow-up could be obtained to further evaluate.  OSA:  - CPAP qHS  Depression:  - Continue tranylcypromine  Back pain, chronic: Had surgery in 2019.  - Tylenol prn, encouraged to work with PT.    Obesity: Estimated body mass index is 34.39 kg/m as calculated from the following:   Height as of 11/12/19: 5\' 2"  (1.575 m).   Weight as of this encounter: 85.3 kg.  DVT prophylaxis: Heparin Code Status: Full Family Communication: None at  bedside Disposition Plan:  Status is: Inpatient  Remains inpatient appropriate because:IV treatments appropriate due to intensity of illness or inability to take PO  Dispo: The patient is from: Home              Anticipated d/c is to: Home              Anticipated d/c date is: 1 day              Patient currently is not medically stable to d/c.  Consultants:   None  Procedures:   None  Antimicrobials:  Zosyn   Subjective: Still feels generally unwell, nauseated, not eating much, but able to drink fine. Says the stretcher he's been laying on in the ED has caused significant low back pain. No vomiting. Denies abdominal pain today.   Objective: Vitals:   12/07/19 0800 12/07/19 1130 12/07/19 1200 12/07/19 1306  BP: 120/82 121/79 112/80 129/72  Pulse: 82 89 91 91  Resp: (!) 29 (!) 23 (!) 23 (!) 25  Temp:    97.9 F (36.6 C)  TempSrc:    Oral  SpO2: 99% 97% 98% 95%  Weight:        Intake/Output Summary (Last 24 hours) at 12/07/2019 1515 Last data filed at 12/07/2019 0521 Gross per 24 hour  Intake 836 ml  Output 2150 ml  Net -1314 ml   Filed Weights   12/06/19 0400  Weight: 85.3 kg    Gen: 78 y.o. male in no discomfort Pulm: Tachypneic without hypoxemia. Clear to auscultation bilaterally.  CV: Regular rate and rhythm. No murmur, rub, or gallop. No JVD, + stable pedal edema. GI: Abdomen soft, non-tender, non-distended, with normoactive bowel  sounds. No organomegaly or masses felt. Ext: Warm, no deformities Skin: LE hyperpigmentation, no ulcers Neuro: Alert and oriented. No focal neurological deficits. Psych: Judgement and insight appear normal. Mood & affect appropriate.   Data Reviewed: I have personally reviewed following labs and imaging studies  CBC: Recent Labs  Lab 12/05/19 1924 12/06/19 0626 12/07/19 0449  WBC 16.8* 13.0* 13.6*  NEUTROABS  --  10.0* 10.5*  HGB 14.0 13.0 11.9*  HCT 41.1 38.6* 36.1*  MCV 94.9 96.0 98.1  PLT 228 181 973   Basic  Metabolic Panel: Recent Labs  Lab 12/05/19 1924 12/06/19 0626 12/07/19 0449  NA 134* 136 139  K 2.8* 2.7* 3.1*  CL 83* 90* 95*  CO2 34* 28 33*  GLUCOSE 141* 92 114*  BUN 105* 95* 79*  CREATININE 2.88* 2.83* 3.00*  CALCIUM 9.5 8.8* 8.7*  MG  --   --  2.8*   GFR: Estimated Creatinine Clearance: 18.9 mL/min (A) (by C-G formula based on SCr of 3 mg/dL (H)). Liver Function Tests: Recent Labs  Lab 12/06/19 0626 12/07/19 0449  AST 20 18  ALT 17 17  ALKPHOS 71 66  BILITOT 1.1 0.9  PROT 6.1* 5.9*  ALBUMIN 3.4* 2.9*   No results for input(s): LIPASE, AMYLASE in the last 168 hours. No results for input(s): AMMONIA in the last 168 hours. Coagulation Profile: No results for input(s): INR, PROTIME in the last 168 hours. Cardiac Enzymes: No results for input(s): CKTOTAL, CKMB, CKMBINDEX, TROPONINI in the last 168 hours. BNP (last 3 results) Recent Labs    01/29/19 1625 06/19/19 1628  PROBNP 103 190   HbA1C: No results for input(s): HGBA1C in the last 72 hours. CBG: Recent Labs  Lab 12/05/19 1922  GLUCAP 139*   Lipid Profile: No results for input(s): CHOL, HDL, LDLCALC, TRIG, CHOLHDL, LDLDIRECT in the last 72 hours. Thyroid Function Tests: No results for input(s): TSH, T4TOTAL, FREET4, T3FREE, THYROIDAB in the last 72 hours. Anemia Panel: No results for input(s): VITAMINB12, FOLATE, FERRITIN, TIBC, IRON, RETICCTPCT in the last 72 hours. Urine analysis:    Component Value Date/Time   COLORURINE YELLOW 12/05/2019 2300   APPEARANCEUR CLEAR 12/05/2019 2300   LABSPEC 1.011 12/05/2019 2300   PHURINE 5.0 12/05/2019 2300   GLUCOSEU NEGATIVE 12/05/2019 2300   HGBUR NEGATIVE 12/05/2019 2300   HGBUR negative 10/03/2009 1528   BILIRUBINUR NEGATIVE 12/05/2019 2300   BILIRUBINUR Negative 07/09/2019 1121   KETONESUR NEGATIVE 12/05/2019 2300   PROTEINUR NEGATIVE 12/05/2019 2300   UROBILINOGEN 0.2 07/09/2019 1121   UROBILINOGEN 0.2 10/03/2009 1528   NITRITE NEGATIVE 12/05/2019  2300   LEUKOCYTESUR NEGATIVE 12/05/2019 2300   Recent Results (from the past 240 hour(s))  SARS Coronavirus 2 by RT PCR (hospital order, performed in [facility] hospital lab) Nasopharyngeal Nasopharyngeal Swab     Status: None   Collection Time: 12/06/19  1:40 AM   Specimen: Nasopharyngeal Swab  Result Value Ref Range Status   SARS Coronavirus 2 NEGATIVE NEGATIVE Final    Comment: (NOTE) SARS-CoV-2 target nucleic acids are NOT DETECTED.  The SARS-CoV-2 RNA is generally detectable in upper and lower respiratory specimens during the acute phase of infection. The lowest concentration of SARS-CoV-2 viral copies this assay can detect is 250 copies / mL. A negative result does not preclude SARS-CoV-2 infection and should not be used as the sole basis for treatment or other patient management decisions.  A negative result may occur with improper specimen collection / handling, submission of specimen other than  nasopharyngeal swab, presence of viral mutation(s) within the areas targeted by this assay, and inadequate number of viral copies (<250 copies / mL). A negative result must be combined with clinical observations, patient history, and epidemiological information.  Fact Sheet for Patients:   StrictlyIdeas.no  Fact Sheet for Healthcare Providers: BankingDealers.co.za  This test is not yet approved or  cleared by the [place] FDA and has been authorized for detection and/or diagnosis of SARS-CoV-2 by FDA under an Emergency Use Authorization (EUA).  This EUA will remain in effect (meaning this test can be used) for the duration of the COVID-19 declaration under Section 564(b)(1) of the Act, 21 U.S.C. section 360bbb-3(b)(1), unless the authorization is terminated or revoked sooner.  Performed at [facility], [place] [address] [place], [place] [address]       Radiology Studies: DG Chest Portable 1 View  Result  Date: 12/05/2019 CLINICAL DATA:  Weakness, nausea and vomiting for 5 days EXAM: PORTABLE CHEST 1 VIEW COMPARISON:  06/20/2019 FINDINGS: Single frontal view of the chest demonstrates a stable cardiac silhouette. No airspace disease, effusion, or pneumothorax. No acute displaced fractures. Stable partially visualized right shoulder arthroplasty. IMPRESSION: 1. No acute intrathoracic process. Electronically Signed   By: Randa Ngo M.D.   On: 12/05/2019 23:57   CT Renal Stone Study  Result Date: 12/06/2019 CLINICAL DATA:  Flank pain EXAM: CT ABDOMEN AND PELVIS WITHOUT CONTRAST TECHNIQUE: Multidetector CT imaging of the abdomen and pelvis was performed following the standard protocol without IV contrast. COMPARISON:  CT 09/24/2008 FINDINGS: Lower chest: Lung bases demonstrate no acute consolidation or effusion. Normal cardiac size. Trace pericardial effusion Hepatobiliary: Small calcified granuloma. Mildly distended gallbladder with small calcified stones. No surrounding inflammatory process. No biliary dilatation. Pancreas: Unremarkable. No pancreatic ductal dilatation or surrounding inflammatory changes. Spleen: Normal in size without focal abnormality. Adrenals/Urinary Tract: Adrenal glands are normal. The kidneys show no hydronephrosis. 13 mm hyperdense lesion mid pole right kidney. 10 mm hyperdense lesion upper pole left kidney. 4.9 cm fat density mass in the upper pole right kidney. The bladder is normal Stomach/Bowel: The stomach is nonenlarged. No dilated small bowel. Extensive diverticular disease of the colon. Mild focal inflammatory changes at the proximal descending colon, series 2, image number 29 and coronal series 4 image number 67 and 68, consistent with acute diverticulitis. No perforation or abscess. Negative appendix. Vascular/Lymphatic: Mild aortic atherosclerosis. No aneurysm. No suspicious nodes. Reproductive: Prostate is unremarkable. Other: Negative for free air or free fluid. Left greater  than right fat containing inguinal hernias. Small fat containing umbilical and periumbilical hernia Musculoskeletal: Posterior rods and fixating screws with interbody device at L4-L5. Chronic fracture deformity of the right inferior pubic ramus. Intramedullary rod within the left femur. IMPRESSION: 1. Negative for hydronephrosis or ureteral stone. 2. Extensive diverticular disease of the colon. Mild focal inflammatory change at the proximal descending colon, consistent with acute diverticulitis. No perforation or abscess. 3. 4.9 cm fat density mass in the upper pole of the right kidney, consistent with angiomyolipoma. There are several hyperdense lesions within the kidneys, suspect that these represent hemorrhagic or proteinaceous cysts. Further evaluation limited without contrast. Nonemergent renal CT or MRI follow-up could be obtained to further evaluate. 4. Fat containing left greater than right inguinal hernias. Small fat containing umbilical and periumbilical hernias. Aortic Atherosclerosis (ICD10-I70.0). Electronically Signed   By: Donavan Foil M.D.   On: 12/06/2019 00:37    Scheduled Meds: . heparin  5,000 Units Subcutaneous Q8H  . potassium  chloride  40 mEq Oral BID  . tranylcypromine  20 mg Oral BID   Continuous Infusions: . piperacillin-tazobactam (ZOSYN)  IV 2.25 g (12/07/19 0846)     LOS: 1 day   Time spent: 25 minutes.  Patrecia Pour, MD [facility] [email or web address] 12/07/2019, 3:15 PM

## 2019-12-07 NOTE — Progress Notes (Signed)
Received report from [employer].

## 2019-12-07 NOTE — Progress Notes (Signed)
Initial Nutrition Assessment  DOCUMENTATION CODES:   Obesity unspecified  INTERVENTION:   - Recommend liberalizing diet to GI Soft diet  - ProSource Plus 30 ml po BID, each supplement provides 100 kcal and 15 grams of protein  - Ensure Enlive po daily, each supplement provides 350 kcal and 20 grams of protein  NUTRITION DIAGNOSIS:   Increased nutrient needs related to acute illness (diverticulitis) as evidenced by estimated needs.  GOAL:   Patient will meet greater than or equal to 90% of their needs  MONITOR:   PO intake, Supplement acceptance, Weight trends, Labs, I & O's  REASON FOR ASSESSMENT:   Malnutrition Screening Tool    ASSESSMENT:   79 year old male who presented to the ED on 9/15 with nausea. PMH of CKD stage IV, sleep apnea, depression. Admitted with dehydration and diverticulitis.   Pt currently on a renal diet with 1800 ml fluid restriction. No meal completions documented. Recommend liberalizing diet to GI Soft given diagnosis of diverticulitis.  Reviewed weight history in chart. Weight fairly stable with fluctuations back and forth between 80-86 kg over the last year. Current weight is 85.3 kg.  Unable to reach pt via phone call to room. RD will order oral nutrition supplements to aid pt in meeting kcal and protein needs.  Medications reviewed and include: Klor-con 40 mEq BID, IV abx  Labs reviewed: potassium 3.1, magnesium 2.8  UOP: 2150 ml x 24 hours  NUTRITION - FOCUSED PHYSICAL EXAM:  Unable to complete at this time. RD working remotely.  Diet Order:   Diet Order            Diet renal with fluid restriction Fluid restriction: 1800 mL Fluid; Room service appropriate? Yes; Fluid consistency: Thin  Diet effective now                 EDUCATION NEEDS:   No education needs have been identified at this time  Skin:  Skin Assessment: Reviewed RN Assessment  Last BM:  no documented BM  Height:   Ht Readings from Last 1 Encounters:   11/12/19 5\' 2"  (1.575 m)    Weight:   Wt Readings from Last 1 Encounters:  12/06/19 85.3 kg    Ideal Body Weight:  53.6 kg  BMI:  Body mass index is 34.39 kg/m.  Estimated Nutritional Needs:   Kcal:  1800-2000  Protein:  85-100 grams  Fluid:  1.8-2.0 L    Gaynell Face, MS, RD, LDN Inpatient Clinical Dietitian Please see AMiON for contact information.

## 2019-12-07 NOTE — Progress Notes (Signed)
PT Cancellation Note  Patient Details Name: [name] MRN: [id number] DOB: [birth year]   Cancelled Treatment:    Reason Eval/Treat Not Completed: Patient declined, reports  Back pain is too great. Will check back another time. [place] Pager [phone] Office [phone]  Claretha Cooper 12/07/2019, 9:42 AM

## 2019-12-08 DIAGNOSIS — R262 Difficulty in walking, not elsewhere classified: Secondary | ICD-10-CM

## 2019-12-08 DIAGNOSIS — M545 Low back pain: Secondary | ICD-10-CM

## 2019-12-08 DIAGNOSIS — G8929 Other chronic pain: Secondary | ICD-10-CM

## 2019-12-08 LAB — BASIC METABOLIC PANEL
Anion gap: 10 (ref 5–15)
BUN: 53 mg/dL — ABNORMAL HIGH (ref 8–23)
CO2: 30 mmol/L (ref 22–32)
Calcium: 8.6 mg/dL — ABNORMAL LOW (ref 8.9–10.3)
Chloride: 100 mmol/L (ref 98–111)
Creatinine, Ser: 2.35 mg/dL — ABNORMAL HIGH (ref 0.61–1.24)
GFR calc Af Amer: 29 mL/min — ABNORMAL LOW (ref >60)
GFR calc non Af Amer: 25 mL/min — ABNORMAL LOW (ref >60)
Glucose, Bld: 94 mg/dL (ref 70–99)
Potassium: 4.2 mmol/L (ref 3.5–5.1)
Sodium: 140 mmol/L (ref 135–145)

## 2019-12-08 LAB — CBC
HCT: 38 % — ABNORMAL LOW (ref 39.0–52.0)
Hemoglobin: 12.4 g/dL — ABNORMAL LOW (ref 13.0–17.0)
MCH: 31.9 pg (ref 26.0–34.0)
MCHC: 32.6 g/dL (ref 30.0–36.0)
MCV: 97.7 fL (ref 80.0–100.0)
Platelets: 145 10*3/uL — ABNORMAL LOW (ref 150–400)
RBC: 3.89 MIL/uL — ABNORMAL LOW (ref 4.22–5.81)
RDW: 11.7 % (ref 11.5–15.5)
WBC: 11.7 10*3/uL — ABNORMAL HIGH (ref 4.0–10.5)
nRBC: 0 % (ref 0.0–0.2)

## 2019-12-08 MED ORDER — SORBITOL 70 % SOLN: 30.0000 mL | Freq: Every day | Status: DC | PRN

## 2019-12-08 MED ORDER — PIPERACILLIN-TAZOBACTAM 3.375 G IVPB
3.3750 g | Freq: Three times a day (TID) | INTRAVENOUS | Status: DC
  Administered 2019-12-08 – 2019-12-12 (×12): 3.375 g via INTRAVENOUS
  Filled 2019-12-08 (×13): qty 50

## 2019-12-08 MED ORDER — GABAPENTIN 100 MG PO CAPS
200.0000 mg | ORAL_CAPSULE | Freq: Once | ORAL | Status: AC
  Administered 2019-12-08: 200 mg via ORAL
  Filled 2019-12-08: qty 2

## 2019-12-08 NOTE — Evaluation (Signed)
Physical Therapy Evaluation Patient Details Name: [name] MRN: [id number] DOB: Nov 01, 1940 Today's Date: 12/08/2019   History of Present Illness  79 yo male admitted with acute diverticulitis. Hx of L shoulder fx 03/2019, CHF, CKD, HTN, osteoporosis, OA  Clinical Impression  On eval, pt was Min guard assist for mobility. He walked ~12 feet x 2 with use of a RW. Pt declined hallway ambulation. Discussed d/c plan-he does not want placement so plan is for home. Will recommend HHPT and 24/7 supervision/assist. Will plan to follow and progress activity as tolerated.     Follow Up Recommendations Home health PT;Supervision/Assistance - 24 hour (pt does not want placement)    Equipment Recommendations  None recommended by PT    Recommendations for Other Services       Precautions / Restrictions Precautions Precautions: Fall Restrictions Weight Bearing Restrictions: No      Mobility  Bed Mobility Overal bed mobility: Needs Assistance Bed Mobility: Supine to Sit     Supine to sit: Min guard;HOB elevated     General bed mobility comments: Increased time.  Transfers Overall transfer level: Needs assistance Equipment used: Rolling walker (2 wheeled) Transfers: Sit to/from Stand Sit to Stand: Min guard         General transfer comment: Min guard for safety. Increased time. VCs hand placement.  Ambulation/Gait Ambulation/Gait assistance: Min guard Gait Distance (Feet): 12 Feet (x2) Assistive device: Rolling walker (2 wheeled) Gait Pattern/deviations: Step-through pattern;Decreased stride length     General Gait Details: Min guard for safety. Slow gait speed. Pt declined hallway ambulation  Stairs            Wheelchair Mobility    Modified Rankin (Stroke Patients Only)       Balance Overall balance assessment: Needs assistance;History of Falls         Standing balance support: Bilateral upper extremity supported Standing balance-Leahy Scale: Poor                                Pertinent Vitals/Pain Pain Assessment: 0-10 Pain Score: 8  Pain Location: back Pain Descriptors / Indicators: Discomfort;Sore;Aching Pain Intervention(s): Limited activity within patient's tolerance;Monitored during session;Repositioned    Home Living Family/patient expects to be discharged to:: Private residence Living Arrangements: Spouse/significant other Available Help at Discharge: Family Type of Home: House Home Access: Stairs to enter Entrance Stairs-Rails: Right Entrance Stairs-Number of Steps: 4 Home Layout: One level Home Equipment: Environmental consultant - 2 wheels;Cane - single point      Prior Function Level of Independence: Independent with assistive device(s)         Comments: uses RW for ambulation     Hand Dominance        Extremity/Trunk Assessment   Upper Extremity Assessment Upper Extremity Assessment: Generalized weakness    Lower Extremity Assessment Lower Extremity Assessment: Generalized weakness    Cervical / Trunk Assessment Cervical / Trunk Assessment: Kyphotic  Communication   Communication: No difficulties  Cognition Arousal/Alertness: Awake/alert Behavior During Therapy: WFL for tasks assessed/performed Overall Cognitive Status: Within Functional Limits for tasks assessed                                        General Comments      Exercises     Assessment/Plan    PT Assessment Patient needs continued PT services  PT Problem List Decreased strength;Decreased mobility;Decreased activity tolerance;Decreased balance;Decreased knowledge of use of DME       PT Treatment Interventions DME instruction;Gait training;Therapeutic activities;Therapeutic exercise;Patient/family education;Balance training;Functional mobility training    PT Goals (Current goals can be found in the Care Plan section)  Acute Rehab PT Goals Patient Stated Goal: to get better and get home PT Goal Formulation:  With patient Time For Goal Achievement: 12/22/19 Potential to Achieve Goals: Good    Frequency Min 3X/week   Barriers to discharge        Co-evaluation               AM-PAC PT "6 Clicks" Mobility  Outcome Measure Help needed turning from your back to your side while in a flat bed without using bedrails?: A Little Help needed moving from lying on your back to sitting on the side of a flat bed without using bedrails?: A Little Help needed moving to and from a bed to a chair (including a wheelchair)?: A Little Help needed standing up from a chair using your arms (e.g., wheelchair or bedside chair)?: A Little Help needed to walk in hospital room?: A Little Help needed climbing 3-5 steps with a railing? : A Little 6 Click Score: 18    End of Session Equipment Utilized During Treatment: Gait belt Activity Tolerance: Patient limited by fatigue;Patient limited by pain Patient left: in chair;with call bell/phone within reach;with chair alarm set   PT Visit Diagnosis: Muscle weakness (generalized) (M62.81);History of falling (Z91.81);Difficulty in walking, not elsewhere classified (R26.2);Pain Pain - part of body:  (back)    Time: 3582-5189 PT Time Calculation (min) (ACUTE ONLY): 42 min   Charges:   PT Evaluation $PT Eval Low Complexity: 1 Low PT Treatments $Gait Training: 8-22 mins          Doreatha Massed, PT Acute Rehabilitation  Office: [phone] Pager: [phone]

## 2019-12-08 NOTE — Progress Notes (Signed)
Pharmacy Antibiotic Note  [name] is a 79 y.o. male admitted on 12/05/2019 with diverticulitis.  Pharmacy has been consulted for Zosyn dosing.  Plan: Zosyn 3.375g IV Q8H infused over 4hrs.  Follow renal function  Weight: 85.3 kg (188 lb)  Temp (24hrs), Avg:97.8 F (36.6 C), Min:97.6 F (36.4 C), Max:98.3 F (36.8 C)  Recent Labs  Lab 12/05/19 1924 12/06/19 0626 12/07/19 0449 12/08/19 0625  WBC 16.8* 13.0* 13.6* 11.7*  CREATININE 2.88* 2.83* 3.00* 2.35*    Estimated Creatinine Clearance: 24.1 mL/min (A) (by C-G formula based on SCr of 2.35 mg/dL (H)).    Allergies  Allergen Reactions   Sulfa Antibiotics Nausea Only    Antimicrobials this admission: 9/16 Zosyn >>    Dose adjustments this admission: 9/18 Zosyn dose renally adjusted   Microbiology results: 9/16 Covid: negative  Thank you for allowing pharmacy to be a part of this patients care.  Gretta Arab PharmD, BCPS Clinical Pharmacist [facility] main pharmacy [phone] 12/08/2019 1:39 PM

## 2019-12-08 NOTE — Plan of Care (Signed)
  Problem: Health Behavior/Discharge Planning: Goal: Ability to manage health-related needs will improve Outcome: Progressing   Problem: Clinical Measurements: Goal: Will remain free from infection Outcome: Progressing   Problem: Activity: Goal: Risk for activity intolerance will decrease Outcome: Progressing   Problem: Nutrition: Goal: Adequate nutrition will be maintained Outcome: Progressing   Problem: Pain Managment: Goal: General experience of comfort will improve Outcome: Progressing   

## 2019-12-08 NOTE — Progress Notes (Signed)
PROGRESS NOTE  [name] JQB:[id number] DOB: 1940/06/25 DOA: 12/05/2019 PCP: [name], MD   LOS: 2 days   Brief narrative: As per HPI,  [name] a78 y.o.malewith a history of stage IV CKD, OSA, and depression presented to hospital with nausea and was noted to have leukocytosis.  He was also noted to have abdominal tenderness so a CT scan of the abdomen pelvis was performed which showed diverticulosis with inflammation along the proximal descending colon.  Patient was then started on Zosyn, antiemetics, and IV fluids and was admitted to the hospital..   Assessment/Plan:  Principal Problem:   Acute diverticulitis Active Problems:   CKD (chronic kidney disease), stage IV (HCC)   OSA (obstructive sleep apnea)   Hypokalemia  Acute diverticulitis:CT scan showed some inflammation of the proximal descending colon.  Patient was put on IV Zosyn in the hospital.  Has improved at this time.  Has been tolerating oral diet.  Will change to oral antibiotic on discharge.  WBC of 11.7 today.  Hypokalemia: Has been improved at this time.  Latest potassium 4.2.  Stage IV CKD: Torsemide and IV fluids on hold at this time.  Creatinine of 2.3.  Suspected renal cysts: On CT 9/16:There are several hyperdense lesions within the kidneys, suspect that these represent hemorrhagic or proteinaceous cysts. Further evaluation limited without contrast. Nonemergent renal CT or MRI follow-up could be obtained to further evaluate as outpatient.  OSA:  - CPAP qHS  Depression:  - Continue tranylcypromine  Back pain, chronic: Post back surgery in 2019.  Complains of chronic back pain and difficulty ambulating.  Patient's significant other states that he needs supervision at home and falls.  Seen by physical therapy recommended rehabilitation.  DVT prophylaxis: heparin injection 5,000 Units Start: 12/06/19 0600   Code Status: Full code  Family Communication: Spoke with the  patient's significant other who stated patient has been noncompliant with the medication regimen at home, needs constant supervision, has propensity to falls and  She does not feel comfortable about him coming home and wishes rehabilitation placement.  Will consult case management.  Status is: Inpatient  Remains inpatient appropriate because:Unsafe d/c plan   Dispo: The patient is from: Home              Anticipated d/c is to: SNF              Anticipated d/c date is: 2 days              Patient currently is medically stable to d/c.   Consultants:  None  Procedures:  None  Antibiotics:   Zosyn 9/16>  Anti-infectives (From admission, onward)   Start     Dose/Rate Route Frequency Ordered Stop   12/08/19 1600  piperacillin-tazobactam (ZOSYN) IVPB 3.375 g        3.375 g 12.5 mL/hr over 240 Minutes Intravenous Every 8 hours 12/08/19 1343     12/06/19 0900  piperacillin-tazobactam (ZOSYN) IVPB 2.25 g  Status:  Discontinued        2.25 g 100 mL/hr over 30 Minutes Intravenous Every 8 hours 12/06/19 0445 12/08/19 1343   12/06/19 0100  piperacillin-tazobactam (ZOSYN) IVPB 3.375 g        3.375 g 100 mL/hr over 30 Minutes Intravenous  Once 12/06/19 0050 12/06/19 0214     Subjective: Today, patient was seen and examined at bedside.  Patient complains of back pain and difficulty ambulating.  Feels sleepy tired and fatigued.  Denies nausea, vomiting, fever  or abdominal pain.  Is been tolerating p.o. diet.  Objective: Vitals:   12/08/19 0702 12/08/19 1343  BP:  109/72  Pulse: 77 82  Resp:  (!) 22  Temp:  97.7 F (36.5 C)  SpO2: 94% 93%    Intake/Output Summary (Last 24 hours) at 12/08/2019 1423 Last data filed at 12/08/2019 1325 Gross per 24 hour  Intake 1069 ml  Output 1300 ml  Net -231 ml   Filed Weights   12/06/19 0400  Weight: 85.3 kg   Body mass index is 34.39 kg/m.   Physical Exam: GENERAL: Patient is alert awake and communicative, mildly somnolent at times,.  Not in obvious distress.  Obese HENT: No scleral pallor or icterus. Pupils equally reactive to light. Oral mucosa is moist NECK: is supple, no gross swelling noted. CHEST: Clear to auscultation. No crackles or wheezes.  Diminished breath sounds bilaterally. CVS: S1 and S2 heard, no murmur. Regular rate and rhythm.  ABDOMEN: Soft, non-tender, bowel sounds are present. EXTREMITIES: Trace pedal edema, hyperpigmentation of the lower extremities CNS: Cranial nerves are intact. No focal motor deficits. SKIN: warm and dry  Data Review: I have personally reviewed the following laboratory data and studies,  CBC: Recent Labs  Lab 12/05/19 1924 12/06/19 0626 12/07/19 0449 12/08/19 0625  WBC 16.8* 13.0* 13.6* 11.7*  NEUTROABS  --  10.0* 10.5*  --   HGB 14.0 13.0 11.9* 12.4*  HCT 41.1 38.6* 36.1* 38.0*  MCV 94.9 96.0 98.1 97.7  PLT 228 181 163 270*   Basic Metabolic Panel: Recent Labs  Lab 12/05/19 1924 12/06/19 0626 12/07/19 0449 12/08/19 0625  NA 134* 136 139 140  K 2.8* 2.7* 3.1* 4.2  CL 83* 90* 95* 100  CO2 34* 28 33* 30  GLUCOSE 141* 92 114* 94  BUN 105* 95* 79* 53*  CREATININE 2.88* 2.83* 3.00* 2.35*  CALCIUM 9.5 8.8* 8.7* 8.6*  MG  --   --  2.8*  --    Liver Function Tests: Recent Labs  Lab 12/06/19 0626 12/07/19 0449  AST 20 18  ALT 17 17  ALKPHOS 71 66  BILITOT 1.1 0.9  PROT 6.1* 5.9*  ALBUMIN 3.4* 2.9*   No results for input(s): LIPASE, AMYLASE in the last 168 hours. No results for input(s): AMMONIA in the last 168 hours. Cardiac Enzymes: No results for input(s): CKTOTAL, CKMB, CKMBINDEX, TROPONINI in the last 168 hours. BNP (last 3 results) Recent Labs    03/24/19 1419  BNP 46.0    ProBNP (last 3 results) Recent Labs    01/29/19 1625 06/19/19 1628  PROBNP 103 190    CBG: Recent Labs  Lab 12/05/19 1922  GLUCAP 139*   Recent Results (from the past 240 hour(s))  SARS Coronavirus 2 by RT PCR (hospital order, performed in [facility] hospital  lab) Nasopharyngeal Nasopharyngeal Swab     Status: None   Collection Time: 12/06/19  1:40 AM   Specimen: Nasopharyngeal Swab  Result Value Ref Range Status   SARS Coronavirus 2 NEGATIVE NEGATIVE Final    Comment: (NOTE) SARS-CoV-2 target nucleic acids are NOT DETECTED.  The SARS-CoV-2 RNA is generally detectable in upper and lower respiratory specimens during the acute phase of infection. The lowest concentration of SARS-CoV-2 viral copies this assay can detect is 250 copies / mL. A negative result does not preclude SARS-CoV-2 infection and should not be used as the sole basis for treatment or other patient management decisions.  A negative result may occur with improper specimen  collection / handling, submission of specimen other than nasopharyngeal swab, presence of viral mutation(s) within the areas targeted by this assay, and inadequate number of viral copies (<250 copies / mL). A negative result must be combined with clinical observations, patient history, and epidemiological information.  Fact Sheet for Patients:   StrictlyIdeas.no  Fact Sheet for Healthcare Providers: BankingDealers.co.za  This test is not yet approved or  cleared by the [place] FDA and has been authorized for detection and/or diagnosis of SARS-CoV-2 by FDA under an Emergency Use Authorization (EUA).  This EUA will remain in effect (meaning this test can be used) for the duration of the COVID-19 declaration under Section 564(b)(1) of the Act, 21 U.S.C. section 360bbb-3(b)(1), unless the authorization is terminated or revoked sooner.  Performed at [facility], [place] [address] [place], [place] [address]      Studies: No results found.   Flora Lipps, MD  [facility] 12/08/2019

## 2019-12-08 NOTE — Progress Notes (Signed)
Pt refuses use of CPAP machine tonight. RT will monitor.

## 2019-12-09 NOTE — Progress Notes (Signed)
PROGRESS NOTE  [name] ZOX:[id number] DOB: 01-20-1941 DOA: 12/05/2019 PCP: [name], MD   LOS: 3 days   Brief narrative: As per HPI,  [name] a78 y.o.malewith a history of stage IV CKD, OSA, and depression presented to hospital with nausea and was noted to have leukocytosis.  He was also noted to have abdominal tenderness so a CT scan of the abdomen pelvis was performed which showed diverticulosis with inflammation along the proximal descending colon.  Patient was then started on Zosyn, antiemetics, and IV fluids and was admitted to the hospital..   Assessment/Plan:  Principal Problem:   Acute diverticulitis Active Problems:   CKD (chronic kidney disease), stage IV (HCC)   OSA (obstructive sleep apnea)   Hypokalemia  Acute diverticulitis:CT scan showed some inflammation of the proximal descending colon.   on IV Zosyn in the hospital.  Has improved at this time.  Has been tolerating oral diet.  Will change to oral antibiotic on discharge.  WBC of 11.7 on 12/08/2019.  Hypokalemia: Has been improved at this time.  Latest potassium 4.2.  Check BMP in a.m.  Stage IV CKD: Torsemide and IV fluids on hold at this time.  Creatinine of 2.3.  Check BMP in a.m.  Suspected renal cysts: On CT 9/16:There are several hyperdense lesions within the kidneys, suspect that these represent hemorrhagic or proteinaceous cysts. Further evaluation limited without contrast. Nonemergent renal CT or MRI follow-up could be obtained to further evaluate as outpatient.  OSA:  - CPAP qHS.  Mostly refusing.  Depression:  - Continue tranylcypromine  Back pain, chronic: Post back surgery in 2019.  Complains of chronic back pain and difficulty ambulating.  Patient's significant other states that he needs supervision at home and falls.  Seen by physical therapy recommended rehabilitation.  DVT prophylaxis: heparin injection 5,000 Units Start: 12/06/19 0600   Code Status: Full  code  Family Communication: None today.  Spoke with the patient's significant other yesterday.   Status is: Inpatient  Remains inpatient appropriate because:Unsafe d/c plan,   Dispo: The patient is from: Home              Anticipated d/c is to: SNF              Anticipated d/c date is: 2 days              Patient currently is medically stable to d/c.   Consultants:  None  Procedures:  None  Antibiotics:  . Zosyn 9/16>  Anti-infectives (From admission, onward)   Start     Dose/Rate Route Frequency Ordered Stop   12/08/19 1600  piperacillin-tazobactam (ZOSYN) IVPB 3.375 g        3.375 g 12.5 mL/hr over 240 Minutes Intravenous Every 8 hours 12/08/19 1343     12/06/19 0900  piperacillin-tazobactam (ZOSYN) IVPB 2.25 g  Status:  Discontinued        2.25 g 100 mL/hr over 30 Minutes Intravenous Every 8 hours 12/06/19 0445 12/08/19 1343   12/06/19 0100  piperacillin-tazobactam (ZOSYN) IVPB 3.375 g        3.375 g 100 mL/hr over 30 Minutes Intravenous  Once 12/06/19 0050 12/06/19 0214     Subjective: Today, patient was seen and examined at bedside.  Still complains of mild back pain.  No fever, chills or rigor.  No nausea vomiting.   Objective: Vitals:   12/09/19 0604 12/09/19 1325  BP: 119/70 118/70  Pulse: 86 80  Resp: 16 17  Temp: (!)  97.5 F (36.4 C) 97.6 F (36.4 C)  SpO2: 96% 95%    Intake/Output Summary (Last 24 hours) at 12/09/2019 1540 Last data filed at 12/09/2019 0619 Gross per 24 hour  Intake 717 ml  Output 1950 ml  Net -1233 ml   Filed Weights   12/06/19 0400  Weight: 85.3 kg   Body mass index is 34.39 kg/m.   Physical Exam: GENERAL: Patient is alert awake and communicative, mildly somnolent at times.. Not in obvious distress.  Class I obese HENT: No scleral pallor or icterus. Pupils equally reactive to light. Oral mucosa is moist NECK: is supple, no gross swelling noted. CHEST: Clear to auscultation. No crackles or wheezes.  Diminished breath  sounds bilaterally. CVS: S1 and S2 heard, no murmur. Regular rate and rhythm.  ABDOMEN: Soft, non-tender, bowel sounds are present. EXTREMITIES: Trace pedal edema, hyperpigmentation of the lower extremities CNS: Cranial nerves are intact. No focal motor deficits. SKIN: warm and dry  Data Review: I have personally reviewed the following laboratory data and studies,  CBC: Recent Labs  Lab 12/05/19 1924 12/06/19 0626 12/07/19 0449 12/08/19 0625  WBC 16.8* 13.0* 13.6* 11.7*  NEUTROABS  --  10.0* 10.5*  --   HGB 14.0 13.0 11.9* 12.4*  HCT 41.1 38.6* 36.1* 38.0*  MCV 94.9 96.0 98.1 97.7  PLT 228 181 163 671*   Basic Metabolic Panel: Recent Labs  Lab 12/05/19 1924 12/06/19 0626 12/07/19 0449 12/08/19 0625  NA 134* 136 139 140  K 2.8* 2.7* 3.1* 4.2  CL 83* 90* 95* 100  CO2 34* 28 33* 30  GLUCOSE 141* 92 114* 94  BUN 105* 95* 79* 53*  CREATININE 2.88* 2.83* 3.00* 2.35*  CALCIUM 9.5 8.8* 8.7* 8.6*  MG  --   --  2.8*  --    Liver Function Tests: Recent Labs  Lab 12/06/19 0626 12/07/19 0449  AST 20 18  ALT 17 17  ALKPHOS 71 66  BILITOT 1.1 0.9  PROT 6.1* 5.9*  ALBUMIN 3.4* 2.9*   No results for input(s): LIPASE, AMYLASE in the last 168 hours. No results for input(s): AMMONIA in the last 168 hours. Cardiac Enzymes: No results for input(s): CKTOTAL, CKMB, CKMBINDEX, TROPONINI in the last 168 hours. BNP (last 3 results) Recent Labs    03/24/19 1419  BNP 46.0    ProBNP (last 3 results) Recent Labs    01/29/19 1625 06/19/19 1628  PROBNP 103 190    CBG: Recent Labs  Lab 12/05/19 1922  GLUCAP 139*   Recent Results (from the past 240 hour(s))  SARS Coronavirus 2 by RT PCR (hospital order, performed in [facility] hospital lab) Nasopharyngeal Nasopharyngeal Swab     Status: None   Collection Time: 12/06/19  1:40 AM   Specimen: Nasopharyngeal Swab  Result Value Ref Range Status   SARS Coronavirus 2 NEGATIVE NEGATIVE Final    Comment: (NOTE) SARS-CoV-2  target nucleic acids are NOT DETECTED.  The SARS-CoV-2 RNA is generally detectable in upper and lower respiratory specimens during the acute phase of infection. The lowest concentration of SARS-CoV-2 viral copies this assay can detect is 250 copies / mL. A negative result does not preclude SARS-CoV-2 infection and should not be used as the sole basis for treatment or other patient management decisions.  A negative result may occur with improper specimen collection / handling, submission of specimen other than nasopharyngeal swab, presence of viral mutation(s) within the areas targeted by this assay, and inadequate number of viral copies (<250  copies / mL). A negative result must be combined with clinical observations, patient history, and epidemiological information.  Fact Sheet for Patients:   StrictlyIdeas.no  Fact Sheet for Healthcare Providers: BankingDealers.co.za  This test is not yet approved or  cleared by the [place] FDA and has been authorized for detection and/or diagnosis of SARS-CoV-2 by FDA under an Emergency Use Authorization (EUA).  This EUA will remain in effect (meaning this test can be used) for the duration of the COVID-19 declaration under Section 564(b)(1) of the Act, 21 U.S.C. section 360bbb-3(b)(1), unless the authorization is terminated or revoked sooner.  Performed at [facility], [place] [address], [place], [place] [address]      Studies: No results found.   Flora Lipps, MD  [facility] 12/09/2019

## 2019-12-09 NOTE — Progress Notes (Signed)
Pt refused CPAP qhs.  Pt encouraged to contact RT should he change his mind.   

## 2019-12-10 ENCOUNTER — Ambulatory Visit: Payer: Medicare Other | Admitting: Family Medicine

## 2019-12-10 LAB — BASIC METABOLIC PANEL
Anion gap: 10 (ref 5–15)
BUN: 46 mg/dL — ABNORMAL HIGH (ref 8–23)
CO2: 27 mmol/L (ref 22–32)
Calcium: 8.2 mg/dL — ABNORMAL LOW (ref 8.9–10.3)
Chloride: 98 mmol/L (ref 98–111)
Creatinine, Ser: 2.13 mg/dL — ABNORMAL HIGH (ref 0.61–1.24)
GFR calc Af Amer: 33 mL/min — ABNORMAL LOW (ref >60)
GFR calc non Af Amer: 29 mL/min — ABNORMAL LOW (ref >60)
Glucose, Bld: 94 mg/dL (ref 70–99)
Potassium: 3.9 mmol/L (ref 3.5–5.1)
Sodium: 135 mmol/L (ref 135–145)

## 2019-12-10 LAB — CBC
HCT: 31.8 % — ABNORMAL LOW (ref 39.0–52.0)
Hemoglobin: 10.6 g/dL — ABNORMAL LOW (ref 13.0–17.0)
MCH: 31.9 pg (ref 26.0–34.0)
MCHC: 33.3 g/dL (ref 30.0–36.0)
MCV: 95.8 fL (ref 80.0–100.0)
Platelets: 145 10*3/uL — ABNORMAL LOW (ref 150–400)
RBC: 3.32 MIL/uL — ABNORMAL LOW (ref 4.22–5.81)
RDW: 11.7 % (ref 11.5–15.5)
WBC: 9.9 10*3/uL (ref 4.0–10.5)
nRBC: 0 % (ref 0.0–0.2)

## 2019-12-10 LAB — MAGNESIUM: Magnesium: 2.5 mg/dL — ABNORMAL HIGH (ref 1.7–2.4)

## 2019-12-10 MED ORDER — TORSEMIDE 20 MG PO TABS
20.0000 mg | ORAL_TABLET | Freq: Every day | ORAL | Status: DC
  Administered 2019-12-10 – 2019-12-12 (×3): 20 mg via ORAL
  Filled 2019-12-10 (×4): qty 1

## 2019-12-10 NOTE — Progress Notes (Signed)
Physical Therapy Treatment Patient Details Name: [name] MRN: [id number] DOB: 21-Oct-1940 Today's Date: 12/10/2019    History of Present Illness 79 yo male admitted with acute diverticulitis. Hx of L shoulder fx 03/2019, CHF, CKD, HTN, osteoporosis, OA    PT Comments    Pt progressing slowly. C/o abd pain and having some stool incontinence. Requested to amb to bathroom, declined amb outside of room. Continue PT POC  Follow Up Recommendations  Home health PT;Supervision/Assistance - 24 hour (does not want placement )     Equipment Recommendations  None recommended by PT    Recommendations for Other Services       Precautions / Restrictions Precautions Precautions: Fall Restrictions Weight Bearing Restrictions: No    Mobility  Bed Mobility Overal bed mobility: Needs Assistance Bed Mobility: Supine to Sit     Supine to sit: Min guard;HOB elevated     General bed mobility comments: Increased time.  Transfers Overall transfer level: Needs assistance Equipment used: Rolling walker (2 wheeled) Transfers: Sit to/from Stand Sit to Stand: Min guard         General transfer comment: Min guard for safety. Increased time. VCs hand placement.  Ambulation/Gait Ambulation/Gait assistance: Min guard Gait Distance (Feet): 15 Feet Assistive device: Rolling walker (2 wheeled) Gait Pattern/deviations: Step-through pattern;Decreased stride length     General Gait Details: Min guard for safety. Slow gait speed. Pt declined hallway ambulation. requested to walk to BR, incontinent of stool    Stairs             Wheelchair Mobility    Modified Rankin (Stroke Patients Only)       Balance                                            Cognition Arousal/Alertness: Awake/alert Behavior During Therapy: WFL for tasks assessed/performed Overall Cognitive Status: Within Functional Limits for tasks assessed                                         Exercises      General Comments        Pertinent Vitals/Pain Pain Assessment: Faces Faces Pain Scale: Hurts even more Pain Location: back Pain Descriptors / Indicators: Discomfort;Sore;Aching Pain Intervention(s): Limited activity within patient's tolerance;Monitored during session;Premedicated before session;Repositioned    Home Living                      Prior Function            PT Goals (current goals can now be found in the care plan section) Acute Rehab PT Goals Patient Stated Goal: to get better and get home PT Goal Formulation: With patient Time For Goal Achievement: 12/22/19 Potential to Achieve Goals: Good Progress towards PT goals: Progressing toward goals    Frequency    Min 3X/week      PT Plan Current plan remains appropriate    Co-evaluation              AM-PAC PT "6 Clicks" Mobility   Outcome Measure  Help needed turning from your back to your side while in a flat bed without using bedrails?: A Little Help needed moving from lying on your back to sitting on the side of a flat  bed without using bedrails?: A Little Help needed moving to and from a bed to a chair (including a wheelchair)?: A Little Help needed standing up from a chair using your arms (e.g., wheelchair or bedside chair)?: A Little Help needed to walk in hospital room?: A Little Help needed climbing 3-5 steps with a railing? : A Little 6 Click Score: 18    End of Session Equipment Utilized During Treatment: Gait belt Activity Tolerance: Patient limited by fatigue;Patient limited by pain Patient left: with call bell/phone within reach;Other (comment) (in bathroom, NT notified)   PT Visit Diagnosis: Muscle weakness (generalized) (M62.81);History of falling (Z91.81);Difficulty in walking, not elsewhere classified (R26.2);Pain Pain - part of body:  (back)     Time: 4136-4383 PT Time Calculation (min) (ACUTE ONLY): 13 min  Charges:  $Gait Training:  8-22 mins                     Baxter Flattery, PT  Acute Rehab Dept ([place]) [phone] Pager [phone]  12/10/2019    [facility] 12/10/2019, 3:43 PM

## 2019-12-10 NOTE — Progress Notes (Signed)
PROGRESS NOTE    [name]  WLN:[id number] DOB: [birth year] DOA: 12/05/2019 PCP: [name], MD   Brief Narrative: [name] a78 y.o.malewith a history of stage IV CKD, OSA, and depression presented to hospital with nausea and was noted to have leukocytosis.  He was also noted to have abdominal tenderness so a CT scan of the abdomen pelvis was performed which showed diverticulosis with inflammation along the proximal descending colon.  Patient was then started on Zosyn, antiemetics, and IV fluids and was admitted to the hospital..   Assessment & Plan:   Principal Problem:   Acute diverticulitis Active Problems:   CKD (chronic kidney disease), stage IV (HCC)   OSA (obstructive sleep apnea)   Hypokalemia    Acute diverticulitis:CT scan showed some inflammation of the proximal descending colon.   Patient is not tolerating p.o. intake. He is very nauseous trying to eat breakfast and did not eat lunch. Will need to monitor him another 24 hours to make sure he is able to tolerated diet prior to discharge.  Leukocytosis resolving.  Hypokalemia:  Resolved potassium 3.9.    Stage IV CKD: Torsemide and IV fluids on hold at this time.  Creatinine of 2.3.  Check BMP in a.m.  Suspected renal cysts: On CT 9/16:There are several hyperdense lesions within the kidneys, suspect that these represent hemorrhagic or proteinaceous cysts. Further evaluation limited without contrast. Nonemergent renal CT or MRI follow-up could be obtained to further evaluate as outpatient.  OSA:  - CPAP qHS.  Mostly refusing.  Depression:  - Continue tranylcypromine  Back pain, chronic: Post back surgery in 2019.  Complains of chronic back pain and difficulty ambulating.  Patient's significant other states that he needs supervision at home and falls.  Seen by physical therapy recommended rehabilitation.   Nutrition Problem: Increased nutrient needs Etiology: acute illness  (diverticulitis)     Signs/Symptoms: estimated needs    Interventions: Ensure Enlive (each supplement provides 350kcal and 20 grams of protein), Prostat, Liberalize Diet  Estimated body mass index is 34.39 kg/m as calculated from the following:   Height as of 11/12/19: 5\' 2"  (1.575 m).   Weight as of this encounter: 85.3 kg.  DVT prophylaxis: Heparin  code Status: Full code Family Communication: None at bedside  disposition Plan:  Status is: Inpatient  Dispo: The patient is from: Home              Anticipated d/c is to: Home              Anticipated d/c date is: 1 day              Patient currently is not medically stable to d/c.   Consultants: None  Procedures: None Antimicrobials Zosyn Subjective: Patient not she is trying to eat breakfast did not have lunch continues to complain of abdominal pain though better than yesterday  Objective: Vitals:   12/10/19 0520 12/10/19 0639 12/10/19 1017 12/10/19 1459  BP: 112/75  124/68 102/78  Pulse: 76  87 81  Resp: 14   19  Temp: 97.8 F (36.6 C)   98 F (36.7 C)  TempSrc: Oral   Oral  SpO2: 94%   92%  Weight:  85.3 kg      Intake/Output Summary (Last 24 hours) at 12/10/2019 1514 Last data filed at 12/10/2019 1502 Gross per 24 hour  Intake 340 ml  Output 3300 ml  Net -2960 ml   Filed Weights   12/06/19 0400 12/10/19 7408  Weight: 85.3 kg 85.3 kg    Examination:  General exam: Appears calm and comfortable  Respiratory system: Clear to auscultation. Respiratory effort normal. Cardiovascular system: S1 & S2 heard, RRR. No JVD, murmurs, rubs, gallops or clicks. No pedal edema. Gastrointestinal system: Abdomen is nondistended, soft and tender. No organomegaly or masses felt. Normal bowel sounds heard. Central nervous system: Alert and oriented. No focal neurological deficits. Extremities: Symmetric 5 x 5 power. Skin: No rashes, lesions or ulcers Psychiatry: Judgement and insight appear normal. Mood & affect  appropriate.     Data Reviewed: I have personally reviewed following labs and imaging studies  CBC: Recent Labs  Lab 12/05/19 1924 12/06/19 0626 12/07/19 0449 12/08/19 0625 12/10/19 0517  WBC 16.8* 13.0* 13.6* 11.7* 9.9  NEUTROABS  --  10.0* 10.5*  --   --   HGB 14.0 13.0 11.9* 12.4* 10.6*  HCT 41.1 38.6* 36.1* 38.0* 31.8*  MCV 94.9 96.0 98.1 97.7 95.8  PLT 228 181 163 145* 413*   Basic Metabolic Panel: Recent Labs  Lab 12/05/19 1924 12/06/19 0626 12/07/19 0449 12/08/19 0625 12/10/19 0517  NA 134* 136 139 140 135  K 2.8* 2.7* 3.1* 4.2 3.9  CL 83* 90* 95* 100 98  CO2 34* 28 33* 30 27  GLUCOSE 141* 92 114* 94 94  BUN 105* 95* 79* 53* 46*  CREATININE 2.88* 2.83* 3.00* 2.35* 2.13*  CALCIUM 9.5 8.8* 8.7* 8.6* 8.2*  MG  --   --  2.8*  --  2.5*   GFR: Estimated Creatinine Clearance: 26.6 mL/min (A) (by C-G formula based on SCr of 2.13 mg/dL (H)). Liver Function Tests: Recent Labs  Lab 12/06/19 0626 12/07/19 0449  AST 20 18  ALT 17 17  ALKPHOS 71 66  BILITOT 1.1 0.9  PROT 6.1* 5.9*  ALBUMIN 3.4* 2.9*   No results for input(s): LIPASE, AMYLASE in the last 168 hours. No results for input(s): AMMONIA in the last 168 hours. Coagulation Profile: No results for input(s): INR, PROTIME in the last 168 hours. Cardiac Enzymes: No results for input(s): CKTOTAL, CKMB, CKMBINDEX, TROPONINI in the last 168 hours. BNP (last 3 results) Recent Labs    01/29/19 1625 06/19/19 1628  PROBNP 103 190   HbA1C: No results for input(s): HGBA1C in the last 72 hours. CBG: Recent Labs  Lab 12/05/19 1922  GLUCAP 139*   Lipid Profile: No results for input(s): CHOL, HDL, LDLCALC, TRIG, CHOLHDL, LDLDIRECT in the last 72 hours. Thyroid Function Tests: No results for input(s): TSH, T4TOTAL, FREET4, T3FREE, THYROIDAB in the last 72 hours. Anemia Panel: No results for input(s): VITAMINB12, FOLATE, FERRITIN, TIBC, IRON, RETICCTPCT in the last 72 hours. Sepsis Labs: No results for  input(s): PROCALCITON, LATICACIDVEN in the last 168 hours.  Recent Results (from the past 240 hour(s))  SARS Coronavirus 2 by RT PCR (hospital order, performed in [facility] hospital lab) Nasopharyngeal Nasopharyngeal Swab     Status: None   Collection Time: 12/06/19  1:40 AM   Specimen: Nasopharyngeal Swab  Result Value Ref Range Status   SARS Coronavirus 2 NEGATIVE NEGATIVE Final    Comment: (NOTE) SARS-CoV-2 target nucleic acids are NOT DETECTED.  The SARS-CoV-2 RNA is generally detectable in upper and lower respiratory specimens during the acute phase of infection. The lowest concentration of SARS-CoV-2 viral copies this assay can detect is 250 copies / mL. A negative result does not preclude SARS-CoV-2 infection and should not be used as the sole basis for treatment or other patient management  decisions.  A negative result may occur with improper specimen collection / handling, submission of specimen other than nasopharyngeal swab, presence of viral mutation(s) within the areas targeted by this assay, and inadequate number of viral copies (<250 copies / mL). A negative result must be combined with clinical observations, patient history, and epidemiological information.  Fact Sheet for Patients:   StrictlyIdeas.no  Fact Sheet for Healthcare Providers: BankingDealers.co.za  This test is not yet approved or  cleared by the [place] FDA and has been authorized for detection and/or diagnosis of SARS-CoV-2 by FDA under an Emergency Use Authorization (EUA).  This EUA will remain in effect (meaning this test can be used) for the duration of the COVID-19 declaration under Section 564(b)(1) of the Act, 21 U.S.C. section 360bbb-3(b)(1), unless the authorization is terminated or revoked sooner.  Performed at [facility] [address] [place], [place] [address]          Radiology Studies: No results  found.      Scheduled Meds: . heparin  5,000 Units Subcutaneous Q8H  . torsemide  20 mg Oral Daily  . tranylcypromine  20 mg Oral BID   Continuous Infusions: . piperacillin-tazobactam (ZOSYN)  IV 3.375 g (12/10/19 0738)     LOS: 4 days   Georgette Shell, MD  12/10/2019, 3:14 PM

## 2019-12-10 NOTE — Care Management Important Message (Signed)
Important Message  Patient Details  Name: [name] MRN: [id number] Date of Birth: 1940/08/19   Medicare Important Message Given:  Yes (Medicare IM printed for Marney Doctor, to give to the patient)     Crista Luria 12/10/2019, 11:13 AM

## 2019-12-11 LAB — GLUCOSE, CAPILLARY
Glucose-Capillary: 106 mg/dL — ABNORMAL HIGH (ref 70–99)
Glucose-Capillary: 137 mg/dL — ABNORMAL HIGH (ref 70–99)

## 2019-12-11 LAB — SARS CORONAVIRUS 2 BY RT PCR (HOSPITAL ORDER, PERFORMED IN ~~LOC~~ HOSPITAL LAB): SARS Coronavirus 2: NEGATIVE

## 2019-12-11 NOTE — NC FL2 (Signed)
[place] LEVEL OF CARE SCREENING TOOL     IDENTIFICATION  Patient Name: [name] Birthdate: 04-Dec-1940 Sex: male Admission Date (Current Location): 12/05/2019  [facility] and [place] Number:  Herbalist and Address:  [facility],  [address] [place], [place]      Provider Number: [id number]  Attending Physician Name and Address:  Georgette Shell, MD  Relative Name and Phone Number:       Current Level of Care: Hospital Recommended Level of Care: [place] Prior Approval Number:    Date Approved/Denied:   PASRR Number: Pending  Discharge Plan: SNF    Current Diagnoses: Patient Active Problem List   Diagnosis Date Noted   Acute diverticulitis 12/06/2019   Hypokalemia 12/06/2019   Bowel incontinence 11/12/2019   Dysphagia 07/19/2019   Anemia due to stage 4 chronic kidney disease ([place]) 03/24/2019   Wound of left lower extremity 03/24/2019   Weakness 03/24/2019   Paroxysmal tachycardia ([place]) 12/05/2018   Spondylolisthesis of lumbar region 38/93/7342   Diastolic dysfunction 87/68/1157   Dyspnea 11/11/2017   Leukocytosis 09/04/2017   History of fracture of left hip 09/01/2017   Senile purpura ([place]) 06/22/2017   Osteoarthritis of spine 03/21/2017   Chronic low back pain with degenerative lumbar spinal stenosis-minimal improvement with surgery late 2019 03/08/2017   Hypersomnolence 08/20/2016   Gout 07/07/2016   Other constipation 01/13/2016   Primary osteoarthritis of right knee 12/08/2015   Fatty liver 10/09/2014   Gallstones 10/09/2014   Bilateral lower extremity edema 09/25/2014   Chronic venous insufficiency 08/29/2013   Right shoulder pain 08/29/2013   Proximal humerus fracture 05/22/2013   Neuropathy 01/20/2011   OSA (obstructive sleep apnea) 10/30/2008   Osteoporosis 09/25/2008   CKD (chronic kidney disease), stage IV ([place]) 03/01/2008   Essential  hypertension 04/11/2007   Mild depressed bipolar I disorder ([place]) 09/21/2006    Orientation RESPIRATION BLADDER Height & Weight     Self, Time, Situation, Place  Normal Continent Weight: 85.3 kg (per documentation earlier this week.) Height:     BEHAVIORAL SYMPTOMS/MOOD NEUROLOGICAL BOWEL NUTRITION STATUS      Continent Diet (Renal, 1800 Fluid restriction.)  AMBULATORY STATUS COMMUNICATION OF NEEDS Skin   Limited Assist Verbally Skin abrasions, Bruising                       Personal Care Assistance Level of Assistance  Bathing, Dressing Bathing Assistance: Limited assistance   Dressing Assistance: Limited assistance     Functional Limitations Info  Sight Sight Info: Impaired        SPECIAL CARE FACTORS FREQUENCY  PT (By licensed PT), OT (By licensed OT)     PT Frequency: 5 x weekly OT Frequency: 5 x weekly            Contractures Contractures Info: Not present    Additional Factors Info  Code Status, Allergies Code Status Info: Full Allergies Info: Sulfa antibiotics           Current Medications (12/11/2019):  This is the current hospital active medication list Current Facility-Administered Medications  Medication Dose Route Frequency Provider Last Rate Last Admin   acetaminophen (TYLENOL) tablet 650 mg  650 mg Oral Q4H PRN Patrecia Pour, MD   650 mg at 12/11/19 0844   bisacodyl (DULCOLAX) EC tablet 10 mg  10 mg Oral Daily PRN Patrecia Pour, MD   10 mg at 12/07/19 2112   heparin injection  5,000 Units  5,000 Units Subcutaneous Q8H Rise Patience, MD   5,000 Units at 12/11/19 0556   ondansetron (ZOFRAN) tablet 4 mg  4 mg Oral Q6H PRN Rise Patience, MD   4 mg at 12/10/19 1425   Or   ondansetron (ZOFRAN) injection 4 mg  4 mg Intravenous Q6H PRN Rise Patience, MD   4 mg at 12/07/19 1628   piperacillin-tazobactam (ZOSYN) IVPB 3.375 g  3.375 g Intravenous Q8H Shade, Christine E, RPH 12.5 mL/hr at 12/11/19 0729 3.375 g at 12/11/19  0729   sorbitol 70 % solution 30 mL  30 mL Oral Daily PRN Minda Ditto, RPH       torsemide (DEMADEX) tablet 20 mg  20 mg Oral Daily Georgette Shell, MD   20 mg at 12/11/19 1058   tranylcypromine (PARNATE) tablet 20 mg  20 mg Oral BID Patrecia Pour, MD   20 mg at 12/11/19 1058     Discharge Medications: Please see discharge summary for a list of discharge medications.  Relevant Imaging Results:  Relevant Lab Results:   Additional Information [id number]  [place] Hy, Marjie Skiff, RN

## 2019-12-11 NOTE — TOC Initial Note (Signed)
Transition of Care [facility]) - Initial/Assessment Note    Patient Details  Name: [name] MRN: [id number] Date of Birth: 1940/04/22  [facility] CM/SW Contact:    Lynnell Catalan, RN Phone Number: 12/11/2019, 2:36 PM  Clinical Narrative:                 This CM was alerted by staff RN today that pt has now decided he wants to go to SNF. FL2 faxed out and bed offers given to pt. [facility] was chosen and liaison was contacted to accept bed. This CM will submit for Passr and start insurance auth. Covid test was ordered by MD today. TOC will continue to follow.  Expected Discharge Plan: [place]     Patient Goals and CMS Choice Patient states their goals for this hospitalization and ongoing recovery are:: to get home CMS Medicare.gov Compare Post Acute Care list provided to:: Patient Choice offered to / list presented to : Patient  Expected Discharge Plan and Services Expected Discharge Plan: [place]   Discharge Planning Services: CM Consult Post Acute Care Choice: [place]                                        Prior Living Arrangements/Services   Lives with:: Spouse Patient language and need for interpreter reviewed:: Yes        Need for Family Participation in Patient Care: Yes (Comment) Care giver support system in place?: Yes (comment)   Criminal Activity/Legal Involvement Pertinent to Current Situation/Hospitalization: No - Comment as needed  Activities of Daily Living Home Assistive Devices/Equipment: Eyeglasses, Environmental consultant (specify type), Other (Comment) (walk-in shower, front wheeled walker) ADL Screening (condition at time of admission) Patient's cognitive ability adequate to safely complete daily activities?: Yes Is the patient deaf or have difficulty hearing?: No Does the patient have difficulty seeing, even when wearing glasses/contacts?: No Does the patient have difficulty concentrating,  remembering, or making decisions?: No Patient able to express need for assistance with ADLs?: Yes Does the patient have difficulty dressing or bathing?: Yes Independently performs ADLs?: No Communication: Independent Dressing (OT): Needs assistance Is this a change from baseline?: Change from baseline, expected to last >3 days Grooming: Needs assistance Is this a change from baseline?: Change from baseline, expected to last >3 days Feeding: Needs assistance Is this a change from baseline?: Change from baseline, expected to last >3 days Bathing: Needs assistance Is this a change from baseline?: Change from baseline, expected to last >3 days Toileting: Needs assistance Is this a change from baseline?: Change from baseline, expected to last >3days In/Out Bed: Needs assistance Is this a change from baseline?: Change from baseline, expected to last >3 days Walks in Home: Needs assistance Is this a change from baseline?: Change from baseline, expected to last >3 days Does the patient have difficulty walking or climbing stairs?: Yes (secondary to weakness) Weakness of Legs: Both Weakness of Arms/Hands: None  Permission Sought/Granted Permission sought to share information with : Facility Art therapist granted to share information with : Yes, Verbal Permission Granted              Emotional Assessment Appearance:: Appears stated age     Orientation: : Oriented to Self, Oriented to Place, Oriented to  Time, Oriented to Situation      Admission diagnosis:  Diverticulitis [K57.92] Acute diverticulitis [K57.92]  Patient Active Problem List   Diagnosis Date Noted   Acute diverticulitis 12/06/2019   Hypokalemia 12/06/2019   Bowel incontinence 11/12/2019   Dysphagia 07/19/2019   Anemia due to stage 4 chronic kidney disease ([place]) 03/24/2019   Wound of left lower extremity 03/24/2019   Weakness 03/24/2019   Paroxysmal tachycardia ([place]) 12/05/2018    Spondylolisthesis of lumbar region 93/71/6967   Diastolic dysfunction 89/38/1017   Dyspnea 11/11/2017   Leukocytosis 09/04/2017   History of fracture of left hip 09/01/2017   Senile purpura ([place]) 06/22/2017   Osteoarthritis of spine 03/21/2017   Chronic low back pain with degenerative lumbar spinal stenosis-minimal improvement with surgery late 2019 03/08/2017   Hypersomnolence 08/20/2016   Gout 07/07/2016   Other constipation 01/13/2016   Primary osteoarthritis of right knee 12/08/2015   Fatty liver 10/09/2014   Gallstones 10/09/2014   Bilateral lower extremity edema 09/25/2014   Chronic venous insufficiency 08/29/2013   Right shoulder pain 08/29/2013   Proximal humerus fracture 05/22/2013   Neuropathy 01/20/2011   OSA (obstructive sleep apnea) 10/30/2008   Osteoporosis 09/25/2008   CKD (chronic kidney disease), stage IV ([place]) 03/01/2008   Essential hypertension 04/11/2007   Mild depressed bipolar I disorder ([place]) 09/21/2006   PCP:  [name], MD Pharmacy:   [id number] [address], [place]. [place] [address] Phone: [phone] Fax: [phone]  [id number] [address], [place]. [place] [address] Phone: [phone] Fax: [place], [place] [address] [place] [address] Phone: [phone] Fax: [place], [address] [place] [address] [place], [place] [address] Phone: [phone] Fax: [phone]     Social Determinants of Health (SDOH) Interventions    Readmission Risk Interventions No flowsheet data found.

## 2019-12-11 NOTE — Progress Notes (Signed)
PROGRESS NOTE    [name]  GYF:[id number] DOB: [birth year] DOA: 12/05/2019 PCP: [name], MD   Brief Narrative: [name] a78 y.o.malewith a history of stage IV CKD, OSA, and depression presented to hospital with nausea and was noted to have leukocytosis.  He was also noted to have abdominal tenderness so a CT scan of the abdomen pelvis was performed which showed diverticulosis with inflammation along the proximal descending colon.  Patient was then started on Zosyn, antiemetics, and IV fluids and was admitted to the hospital..   Assessment & Plan:   Principal Problem:   Acute diverticulitis Active Problems:   CKD (chronic kidney disease), stage IV (HCC)   OSA (obstructive sleep apnea)   Hypokalemia    Acute diverticulitis:CT scan showed some inflammation of the proximal descending colon.    Patient continues with decreased p.o. intake. Seen by PT recommends skilled nursing facility. He wants to go to [place] for rehabilitation.  Case manager aware.  Hypokalemia:  Resolved potassium 3.9.    Stage IV CKD: Torsemide and IV fluids on hold at this time.  Creatinine of 2.3.  Check BMP in a.m.  Suspected renal cysts: On CT 9/16:There are several hyperdense lesions within the kidneys, suspect that these represent hemorrhagic or proteinaceous cysts. Further evaluation limited without contrast. Nonemergent renal CT or MRI follow-up could be obtained to further evaluate as outpatient.  OSA:  - CPAP qHS.  Mostly refusing.  Depression:  - Continue tranylcypromine  Back pain, chronic: Post back surgery in 2019.  Complains of chronic back pain and difficulty ambulating.  Patient's significant other states that he needs supervision at home and falls.  Seen by physical therapy recommended rehabilitation.   Nutrition Problem: Increased nutrient needs Etiology: acute illness (diverticulitis)     Signs/Symptoms: estimated needs    Interventions: Ensure  Enlive (each supplement provides 350kcal and 20 grams of protein), Prostat, Liberalize Diet  Estimated body mass index is 34.39 kg/m as calculated from the following:   Height as of 11/12/19: 5\' 2"  (1.575 m).   Weight as of this encounter: 85.3 kg.  DVT prophylaxis: Heparin  code Status: Full code Family Communication: None at bedside  disposition Plan:  Status is: Inpatient  Dispo: The patient is from: Home              Anticipated d/c is to:snf              Anticipated d/c date is: 1 day              Patient currently able to be discharged waiting for SNF.   Consultants: None  Procedures: None Antimicrobials Zosyn Subjective: Patient resting in bed has decreased appetite does not want to eat anything denies any abdominal pain or nausea today  Objective: Vitals:   12/10/19 1936 12/11/19 0507 12/11/19 1100 12/11/19 1234  BP: 108/66 115/69 109/89 118/67  Pulse: 97 69 97 80  Resp: 19 18  (!) 22  Temp: 97.9 F (36.6 C) 98.2 F (36.8 C)  98.2 F (36.8 C)  TempSrc: Oral Oral  Oral  SpO2: 96% 96%  94%  Weight:        Intake/Output Summary (Last 24 hours) at 12/11/2019 1307 Last data filed at 12/11/2019 0512 Gross per 24 hour  Intake 359.18 ml  Output 2400 ml  Net -2040.82 ml   Filed Weights   12/06/19 0400 12/10/19 0639  Weight: 85.3 kg 85.3 kg    Examination:  General exam: Appears calm and  comfortable  Respiratory system: Clear to auscultation. Respiratory effort normal. Cardiovascular system: S1 & S2 heard, RRR. No JVD, murmurs, rubs, gallops or clicks. No pedal edema. Gastrointestinal system: Abdomen is nondistended, soft and tender. No organomegaly or masses felt. Normal bowel sounds heard. Central nervous system: Alert and oriented. No focal neurological deficits. Extremities: Symmetric 5 x 5 power. Skin: No rashes, lesions or ulcers Psychiatry: Judgement and insight appear normal. Mood & affect appropriate.     Data Reviewed: I have personally reviewed  following labs and imaging studies  CBC: Recent Labs  Lab 12/05/19 1924 12/06/19 0626 12/07/19 0449 12/08/19 0625 12/10/19 0517  WBC 16.8* 13.0* 13.6* 11.7* 9.9  NEUTROABS  --  10.0* 10.5*  --   --   HGB 14.0 13.0 11.9* 12.4* 10.6*  HCT 41.1 38.6* 36.1* 38.0* 31.8*  MCV 94.9 96.0 98.1 97.7 95.8  PLT 228 181 163 145* 196*   Basic Metabolic Panel: Recent Labs  Lab 12/05/19 1924 12/06/19 0626 12/07/19 0449 12/08/19 0625 12/10/19 0517  NA 134* 136 139 140 135  K 2.8* 2.7* 3.1* 4.2 3.9  CL 83* 90* 95* 100 98  CO2 34* 28 33* 30 27  GLUCOSE 141* 92 114* 94 94  BUN 105* 95* 79* 53* 46*  CREATININE 2.88* 2.83* 3.00* 2.35* 2.13*  CALCIUM 9.5 8.8* 8.7* 8.6* 8.2*  MG  --   --  2.8*  --  2.5*   GFR: Estimated Creatinine Clearance: 26.6 mL/min (A) (by C-G formula based on SCr of 2.13 mg/dL (H)). Liver Function Tests: Recent Labs  Lab 12/06/19 0626 12/07/19 0449  AST 20 18  ALT 17 17  ALKPHOS 71 66  BILITOT 1.1 0.9  PROT 6.1* 5.9*  ALBUMIN 3.4* 2.9*   No results for input(s): LIPASE, AMYLASE in the last 168 hours. No results for input(s): AMMONIA in the last 168 hours. Coagulation Profile: No results for input(s): INR, PROTIME in the last 168 hours. Cardiac Enzymes: No results for input(s): CKTOTAL, CKMB, CKMBINDEX, TROPONINI in the last 168 hours. BNP (last 3 results) Recent Labs    01/29/19 1625 06/19/19 1628  PROBNP 103 190   HbA1C: No results for input(s): HGBA1C in the last 72 hours. CBG: Recent Labs  Lab 12/05/19 1922 12/11/19 1232  GLUCAP 139* 106*   Lipid Profile: No results for input(s): CHOL, HDL, LDLCALC, TRIG, CHOLHDL, LDLDIRECT in the last 72 hours. Thyroid Function Tests: No results for input(s): TSH, T4TOTAL, FREET4, T3FREE, THYROIDAB in the last 72 hours. Anemia Panel: No results for input(s): VITAMINB12, FOLATE, FERRITIN, TIBC, IRON, RETICCTPCT in the last 72 hours. Sepsis Labs: No results for input(s): PROCALCITON, LATICACIDVEN in the  last 168 hours.  Recent Results (from the past 240 hour(s))  SARS Coronavirus 2 by RT PCR (hospital order, performed in [facility] hospital lab) Nasopharyngeal Nasopharyngeal Swab     Status: None   Collection Time: 12/06/19  1:40 AM   Specimen: Nasopharyngeal Swab  Result Value Ref Range Status   SARS Coronavirus 2 NEGATIVE NEGATIVE Final    Comment: (NOTE) SARS-CoV-2 target nucleic acids are NOT DETECTED.  The SARS-CoV-2 RNA is generally detectable in upper and lower respiratory specimens during the acute phase of infection. The lowest concentration of SARS-CoV-2 viral copies this assay can detect is 250 copies / mL. A negative result does not preclude SARS-CoV-2 infection and should not be used as the sole basis for treatment or other patient management decisions.  A negative result may occur with improper specimen collection /  handling, submission of specimen other than nasopharyngeal swab, presence of viral mutation(s) within the areas targeted by this assay, and inadequate number of viral copies (<250 copies / mL). A negative result must be combined with clinical observations, patient history, and epidemiological information.  Fact Sheet for Patients:   StrictlyIdeas.no  Fact Sheet for Healthcare Providers: BankingDealers.co.za  This test is not yet approved or  cleared by the [place] FDA and has been authorized for detection and/or diagnosis of SARS-CoV-2 by FDA under an Emergency Use Authorization (EUA).  This EUA will remain in effect (meaning this test can be used) for the duration of the COVID-19 declaration under Section 564(b)(1) of the Act, 21 U.S.C. section 360bbb-3(b)(1), unless the authorization is terminated or revoked sooner.  Performed at [facility], [place] [address], [place], [place] [address]          Radiology Studies: No results found.      Scheduled Meds: . heparin   5,000 Units Subcutaneous Q8H  . torsemide  20 mg Oral Daily  . tranylcypromine  20 mg Oral BID   Continuous Infusions: . piperacillin-tazobactam (ZOSYN)  IV 3.375 g (12/11/19 0729)     LOS: 5 days   Georgette Shell, MD  12/11/2019, 1:07 PM

## 2019-12-11 NOTE — Progress Notes (Signed)
Pharmacy Antibiotic Note  [name] is a 79 y.o. male admitted on 12/05/2019 with diverticulitis.  Pharmacy has been consulted for piperacillin/tazobactam dosing.  Today, 12/11/19 -WBC WNL, afebrile -SCr 2.1, CrCl ~27 mL/min. Pt has CKD, renal function appears to be at baseline  Today is day #6 of IV antibiotics.  Plan:  Continue piperacillin/tazobactam 3.375 g IV q6h EI  Pharmacy to sign off, please re-consult if needed.   Weight: 85.3 kg (188 lb) (per documentation earlier this week.)  Temp (24hrs), Avg:98 F (36.7 C), Min:97.9 F (36.6 C), Max:98.2 F (36.8 C)  Recent Labs  Lab 12/05/19 1924 12/06/19 0626 12/07/19 0449 12/08/19 0625 12/10/19 0517  WBC 16.8* 13.0* 13.6* 11.7* 9.9  CREATININE 2.88* 2.83* 3.00* 2.35* 2.13*    Estimated Creatinine Clearance: 26.6 mL/min (A) (by C-G formula based on SCr of 2.13 mg/dL (H)).    Allergies  Allergen Reactions  . Sulfa Antibiotics Nausea Only    Antimicrobials this admission: 9/16 Zosyn >>    Dose adjustments this admission: 9/18 Zosyn dose renally adjusted   Microbiology results: 9/16 Covid: negative  Thank you for allowing pharmacy to be a part of this patient's care.  Lenis Noon, PharmD 12/11/19 8:40 AM

## 2019-12-11 NOTE — Progress Notes (Signed)
Physical Therapy Treatment Patient Details Name: [name] MRN: [id number] DOB: March 28, 1940 Today's Date: 12/11/2019    History of Present Illness 79 yo male admitted with acute diverticulitis. Hx of L shoulder fx 03/2019, CHF, CKD, HTN, osteoporosis, OA    PT Comments    Pt requires increased time for all tasks and overall min assist for mobility today.  Pt ambulated to bathroom and then around room (declined hallway).  Pt now agreeable to SNF for rehab.    Follow Up Recommendations  SNF;Supervision/Assistance - 24 hour     Equipment Recommendations  None recommended by PT    Recommendations for Other Services       Precautions / Restrictions Precautions Precautions: Fall Restrictions Weight Bearing Restrictions: No    Mobility  Bed Mobility Overal bed mobility: Needs Assistance Bed Mobility: Sit to Supine       Sit to supine: Supervision   General bed mobility comments: Increased time.  Transfers Overall transfer level: Needs assistance Equipment used: Rolling walker (2 wheeled) Transfers: Sit to/from Stand Sit to Stand: Min assist         General transfer comment: slight assist to rise, cues for hand placement, increased time  Ambulation/Gait Ambulation/Gait assistance: Min assist;Min guard Gait Distance (Feet): 50 Feet Assistive device: Rolling walker (2 wheeled) Gait Pattern/deviations: Step-through pattern;Decreased stride length     General Gait Details: initially min assist to start however improved to Min guard for safety. very slow gait speed. Pt declined hallway ambulation. requested to walk to bathroom and then ambulate around room   Stairs             Wheelchair Mobility    Modified Rankin (Stroke Patients Only)       Balance Overall balance assessment: Needs assistance;History of Falls         Standing balance support: Bilateral upper extremity supported Standing balance-Leahy Scale: Poor Standing balance comment:  reliant on UE support                            Cognition Arousal/Alertness: Awake/alert Behavior During Therapy: WFL for tasks assessed/performed Overall Cognitive Status: Within Functional Limits for tasks assessed                                        Exercises      General Comments        Pertinent Vitals/Pain Pain Assessment: No/denies pain Pain Intervention(s): Monitored during session;Repositioned    Home Living                      Prior Function            PT Goals (current goals can now be found in the care plan section) Progress towards PT goals: Progressing toward goals    Frequency    Min 2X/week      PT Plan Discharge plan needs to be updated    Co-evaluation              AM-PAC PT "6 Clicks" Mobility   Outcome Measure  Help needed turning from your back to your side while in a flat bed without using bedrails?: A Little Help needed moving from lying on your back to sitting on the side of a flat bed without using bedrails?: A Little Help needed moving to and from a bed  to a chair (including a wheelchair)?: A Little Help needed standing up from a chair using your arms (e.g., wheelchair or bedside chair)?: A Little Help needed to walk in hospital room?: A Little Help needed climbing 3-5 steps with a railing? : A Little 6 Click Score: 18    End of Session Equipment Utilized During Treatment: Gait belt Activity Tolerance: Patient limited by fatigue Patient left: in bed;with call bell/phone within reach;with bed alarm set;with nursing/sitter in room   PT Visit Diagnosis: Muscle weakness (generalized) (M62.81);History of falling (Z91.81);Difficulty in walking, not elsewhere classified (R26.2)     Time: 0370-9643 PT Time Calculation (min) (ACUTE ONLY): 33 min  Charges:  $Gait Training: 23-37 mins                     Jannette Spanner PT, DPT Acute Rehabilitation Services Pager: [phone] Office:  [phone]  York Ram E 12/11/2019, 11:47 AM

## 2019-12-12 DIAGNOSIS — R5381 Other malaise: Secondary | ICD-10-CM | POA: Diagnosis not present

## 2019-12-12 DIAGNOSIS — Z7401 Bed confinement status: Secondary | ICD-10-CM | POA: Diagnosis not present

## 2019-12-12 DIAGNOSIS — R1312 Dysphagia, oropharyngeal phase: Secondary | ICD-10-CM | POA: Diagnosis not present

## 2019-12-12 DIAGNOSIS — U071 COVID-19: Secondary | ICD-10-CM | POA: Diagnosis not present

## 2019-12-12 DIAGNOSIS — R7989 Other specified abnormal findings of blood chemistry: Secondary | ICD-10-CM | POA: Diagnosis not present

## 2019-12-12 DIAGNOSIS — K5792 Diverticulitis of intestine, part unspecified, without perforation or abscess without bleeding: Secondary | ICD-10-CM | POA: Diagnosis not present

## 2019-12-12 DIAGNOSIS — M2550 Pain in unspecified joint: Secondary | ICD-10-CM | POA: Diagnosis not present

## 2019-12-12 DIAGNOSIS — Z743 Need for continuous supervision: Secondary | ICD-10-CM | POA: Diagnosis not present

## 2019-12-12 DIAGNOSIS — R2681 Unsteadiness on feet: Secondary | ICD-10-CM | POA: Diagnosis not present

## 2019-12-12 DIAGNOSIS — N184 Chronic kidney disease, stage 4 (severe): Secondary | ICD-10-CM | POA: Diagnosis not present

## 2019-12-12 DIAGNOSIS — R2689 Other abnormalities of gait and mobility: Secondary | ICD-10-CM | POA: Diagnosis not present

## 2019-12-12 DIAGNOSIS — G4733 Obstructive sleep apnea (adult) (pediatric): Secondary | ICD-10-CM | POA: Diagnosis not present

## 2019-12-12 DIAGNOSIS — M6281 Muscle weakness (generalized): Secondary | ICD-10-CM | POA: Diagnosis not present

## 2019-12-12 DIAGNOSIS — N281 Cyst of kidney, acquired: Secondary | ICD-10-CM | POA: Diagnosis not present

## 2019-12-12 LAB — COMPREHENSIVE METABOLIC PANEL
ALT: 22 U/L (ref 0–44)
AST: 19 U/L (ref 15–41)
Albumin: 2.7 g/dL — ABNORMAL LOW (ref 3.5–5.0)
Alkaline Phosphatase: 53 U/L (ref 38–126)
Anion gap: 10 (ref 5–15)
BUN: 50 mg/dL — ABNORMAL HIGH (ref 8–23)
CO2: 27 mmol/L (ref 22–32)
Calcium: 8.7 mg/dL — ABNORMAL LOW (ref 8.9–10.3)
Chloride: 101 mmol/L (ref 98–111)
Creatinine, Ser: 2.46 mg/dL — ABNORMAL HIGH (ref 0.61–1.24)
GFR calc Af Amer: 28 mL/min — ABNORMAL LOW (ref >60)
GFR calc non Af Amer: 24 mL/min — ABNORMAL LOW (ref >60)
Glucose, Bld: 91 mg/dL (ref 70–99)
Potassium: 3.7 mmol/L (ref 3.5–5.1)
Sodium: 138 mmol/L (ref 135–145)
Total Bilirubin: 0.5 mg/dL (ref 0.3–1.2)
Total Protein: 6 g/dL — ABNORMAL LOW (ref 6.5–8.1)

## 2019-12-12 LAB — CBC
HCT: 35.2 % — ABNORMAL LOW (ref 39.0–52.0)
Hemoglobin: 11.2 g/dL — ABNORMAL LOW (ref 13.0–17.0)
MCH: 31.8 pg (ref 26.0–34.0)
MCHC: 31.8 g/dL (ref 30.0–36.0)
MCV: 100 fL (ref 80.0–100.0)
Platelets: 186 10*3/uL (ref 150–400)
RBC: 3.52 MIL/uL — ABNORMAL LOW (ref 4.22–5.81)
RDW: 11.8 % (ref 11.5–15.5)
WBC: 7.5 10*3/uL (ref 4.0–10.5)
nRBC: 0 % (ref 0.0–0.2)

## 2019-12-12 MED ORDER — SACCHAROMYCES BOULARDII 250 MG PO CAPS: 250.0000 mg | ORAL_CAPSULE | Freq: Two times a day (BID) | ORAL | 0 refills | Status: AC

## 2019-12-12 MED ORDER — AMOXICILLIN-POT CLAVULANATE 875-125 MG PO TABS: 1.0000 | ORAL_TABLET | Freq: Two times a day (BID) | ORAL | 0 refills | Status: AC

## 2019-12-12 MED ORDER — SACCHAROMYCES BOULARDII 250 MG PO CAPS
250.0000 mg | ORAL_CAPSULE | Freq: Two times a day (BID) | ORAL | Status: DC
  Administered 2019-12-12: 250 mg via ORAL
  Filled 2019-12-12: qty 1

## 2019-12-12 NOTE — TOC Transition Note (Signed)
Transition of Care [facility]) - CM/SW Discharge Note   Patient Details  Name: [name] MRN: [id number] Date of Birth: 07/04/1940  [facility] CM/SW Contact:  Lynnell Catalan, RN Phone Number: 12/12/2019, 10:05 AM   Clinical Narrative:    Insurance Josem Kaufmann was submitted yesterday (ref [id number]) and there is a Hospital doctor in place. Pt doesn't need to wait in hospital for auth. Additional info was sent to [employer] per request and it is in manual review. Passr waiver also in place. [place] liaison notified of above and pt can dc at this point. MD made aware and pt will be transported via [facility]. RN to call report to [phone].   Final next level of care: [facility]     Patient Goals and CMS Choice Patient states their goals for this hospitalization and ongoing recovery are:: to get home CMS Medicare.gov Compare Post Acute Care list provided to:: Patient Choice offered to / list presented to : Patient

## 2019-12-12 NOTE — Plan of Care (Signed)
Plan of care reviewed and discussed with the patient. 

## 2019-12-12 NOTE — Discharge Summary (Signed)
Physician Discharge Summary  [name] YTK:[id number] DOB: May 10, 1940 DOA: 12/05/2019  PCP: [name], MD  Admit date: 12/05/2019 Discharge date: 12/12/2019  Admitted From:home Disposition: home Recommendations for Outpatient Follow-up:  1. Follow up with PCP in 1-2 weeks 2. Please obtain BMP/CBC in one week  Home Health none Equipment/Devices:none  Discharge Condition:stable CODE STATUS full Diet recommendation:cardiac Brief/Interim Summary:Michael Pircheskyis a78 y.o.malewith a history of stage IV CKD, OSA, and depression presented to hospital with nausea and was noted to have leukocytosis. He was also noted to have abdominal tenderness so a CT scan of the abdomen pelvis was performed which showed diverticulosis with inflammation along the proximal descending colon. Patient was then started on Zosyn, antiemetics, and IV fluids and was admitted to the hospital  Discharge Diagnoses:  Principal Problem:   Acute diverticulitis Active Problems:   CKD (chronic kidney disease), stage IV (HCC)   OSA (obstructive sleep apnea)   Hypokalemia   Acute diverticulitis:CT scan showed some inflammation of the proximal descending colon.    He was treated with IV fluids and IV antibiotics.  He will be discharged on Augmentin twice a day for 5 more days.   Hypokalemia: Resolved potassium 3.7.  Please recheck labs in 2 days.  Stage IV CKD: Torsemide restarted.  Recheck BMP in 2 days.  Suspected renal cysts: On CT 9/16:There are several hyperdense lesions within the kidneys, suspect that these represent hemorrhagic or proteinaceous cysts. Further evaluation limited without contrast. Nonemergent renal CT or MRI follow-up could be obtained to further evaluate as outpatient.  OSA:  - CPAP qHS.  Depression:  - Continue tranylcypromine  Back pain, chronic: Post back surgery in 2019. Complains of chronic back pain and difficulty ambulating. Patient's significant other states  that he needs supervision at home and falls. Seen by physical therapy recommended rehabilitation.    Nutrition Problem: Increased nutrient needs Etiology: acute illness (diverticulitis)    Signs/Symptoms: estimated needs     Interventions: Ensure Enlive (each supplement provides 350kcal and 20 grams of protein), Prostat, Liberalize Diet  Estimated body mass index is 34.39 kg/m as calculated from the following:   Height as of this encounter: 5\' 2"  (1.575 m).   Weight as of this encounter: 85.3 kg.  Discharge Instructions  Discharge Instructions    Diet - low sodium heart healthy   Complete by: As directed    Increase activity slowly   Complete by: As directed      Allergies as of 12/12/2019      Reactions   Sulfa Antibiotics Nausea Only      Medication List    STOP taking these medications   mirabegron ER 25 MG Tb24 tablet Commonly known as: Myrbetriq     TAKE these medications   amoxicillin-clavulanate 875-125 MG tablet Commonly known as: Augmentin Take 1 tablet by mouth 2 (two) times daily for 5 days.   CALTRATE 600 PLUS-VIT D PO Take 1 tablet by mouth daily.   L-methylfolate Calcium 15 MG Tabs Take 15 mg by mouth daily.   saccharomyces boulardii 250 MG capsule Commonly known as: FLORASTOR Take 1 capsule (250 mg total) by mouth 2 (two) times daily.   torsemide 20 MG tablet Commonly known as: DEMADEX Take 1 tablet (20 mg total) by mouth daily.   tranylcypromine 10 MG tablet Commonly known as: PARNATE Take 20 mg by mouth 2 (two) times daily.   Tylenol 8 Hour 650 MG CR tablet Generic drug: acetaminophen Take 1,300 mg by mouth 2 (two) times  daily.       Follow-up Information    [name], MD Follow up.   Specialty: Family Medicine Contact information: [place] [address] [phone]        Sueanne Margarita, MD .   Specialty: Cardiology Contact information: [phone] [place]  [address] [phone]              Allergies  Allergen Reactions  . Sulfa Antibiotics Nausea Only    Consultations:  None   Procedures/Studies: DG Chest Portable 1 View  Result Date: 12/05/2019 CLINICAL DATA:  Weakness, nausea and vomiting for 5 days EXAM: PORTABLE CHEST 1 VIEW COMPARISON:  06/20/2019 FINDINGS: Single frontal view of the chest demonstrates a stable cardiac silhouette. No airspace disease, effusion, or pneumothorax. No acute displaced fractures. Stable partially visualized right shoulder arthroplasty. IMPRESSION: 1. No acute intrathoracic process. Electronically Signed   By: Randa Ngo M.D.   On: 12/05/2019 23:57   CT Renal Stone Study  Result Date: 12/06/2019 CLINICAL DATA:  Flank pain EXAM: CT ABDOMEN AND PELVIS WITHOUT CONTRAST TECHNIQUE: Multidetector CT imaging of the abdomen and pelvis was performed following the standard protocol without IV contrast. COMPARISON:  CT 09/24/2008 FINDINGS: Lower chest: Lung bases demonstrate no acute consolidation or effusion. Normal cardiac size. Trace pericardial effusion Hepatobiliary: Small calcified granuloma. Mildly distended gallbladder with small calcified stones. No surrounding inflammatory process. No biliary dilatation. Pancreas: Unremarkable. No pancreatic ductal dilatation or surrounding inflammatory changes. Spleen: Normal in size without focal abnormality. Adrenals/Urinary Tract: Adrenal glands are normal. The kidneys show no hydronephrosis. 13 mm hyperdense lesion mid pole right kidney. 10 mm hyperdense lesion upper pole left kidney. 4.9 cm fat density mass in the upper pole right kidney. The bladder is normal Stomach/Bowel: The stomach is nonenlarged. No dilated small bowel. Extensive diverticular disease of the colon. Mild focal inflammatory changes at the proximal descending colon, series 2, image number 29 and coronal series 4 image number 67 and 68, consistent with acute diverticulitis. No perforation or abscess.  Negative appendix. Vascular/Lymphatic: Mild aortic atherosclerosis. No aneurysm. No suspicious nodes. Reproductive: Prostate is unremarkable. Other: Negative for free air or free fluid. Left greater than right fat containing inguinal hernias. Small fat containing umbilical and periumbilical hernia Musculoskeletal: Posterior rods and fixating screws with interbody device at L4-L5. Chronic fracture deformity of the right inferior pubic ramus. Intramedullary rod within the left femur. IMPRESSION: 1. Negative for hydronephrosis or ureteral stone. 2. Extensive diverticular disease of the colon. Mild focal inflammatory change at the proximal descending colon, consistent with acute diverticulitis. No perforation or abscess. 3. 4.9 cm fat density mass in the upper pole of the right kidney, consistent with angiomyolipoma. There are several hyperdense lesions within the kidneys, suspect that these represent hemorrhagic or proteinaceous cysts. Further evaluation limited without contrast. Nonemergent renal CT or MRI follow-up could be obtained to further evaluate. 4. Fat containing left greater than right inguinal hernias. Small fat containing umbilical and periumbilical hernias. Aortic Atherosclerosis (ICD10-I70.0). Electronically Signed   By: Donavan Foil M.D.   On: 12/06/2019 00:37    (Echo, Carotid, EGD, Colonoscopy, ERCP)    Subjective: Patient resting in bed anxious to go to rehab no new complaints today  Discharge Exam: Vitals:   12/11/19 2016 12/12/19 0549  BP: 120/71 140/86  Pulse: 84 78  Resp: 18 20  Temp: 98.4 F (36.9 C) 98 F (36.7 C)  SpO2: 94% 95%   Vitals:   12/11/19  1234 12/11/19 2016 12/12/19 0549 12/12/19 0815  BP: 118/67 120/71 140/86   Pulse: 80 84 78   Resp: (!) 22 18 20    Temp: 98.2 F (36.8 C) 98.4 F (36.9 C) 98 F (36.7 C)   TempSrc: Oral Oral Oral   SpO2: 94% 94% 95%   Weight:      Height:    5\' 2"  (1.575 m)    General: Pt is alert, awake, not in acute  distress Cardiovascular: RRR, S1/S2 +, no rubs, no gallops Respiratory: CTA bilaterally, no wheezing, no rhonchi Abdominal: Soft, NT, ND, bowel sounds + Extremities: no edema, no cyanosis    The results of significant diagnostics from this hospitalization (including imaging, microbiology, ancillary and laboratory) are listed below for reference.     Microbiology: Recent Results (from the past 240 hour(s))  SARS Coronavirus 2 by RT PCR (hospital order, performed in [facility] hospital lab) Nasopharyngeal Nasopharyngeal Swab     Status: None   Collection Time: 12/06/19  1:40 AM   Specimen: Nasopharyngeal Swab  Result Value Ref Range Status   SARS Coronavirus 2 NEGATIVE NEGATIVE Final    Comment: (NOTE) SARS-CoV-2 target nucleic acids are NOT DETECTED.  The SARS-CoV-2 RNA is generally detectable in upper and lower respiratory specimens during the acute phase of infection. The lowest concentration of SARS-CoV-2 viral copies this assay can detect is 250 copies / mL. A negative result does not preclude SARS-CoV-2 infection and should not be used as the sole basis for treatment or other patient management decisions.  A negative result may occur with improper specimen collection / handling, submission of specimen other than nasopharyngeal swab, presence of viral mutation(s) within the areas targeted by this assay, and inadequate number of viral copies (<250 copies / mL). A negative result must be combined with clinical observations, patient history, and epidemiological information.  Fact Sheet for Patients:   StrictlyIdeas.no  Fact Sheet for Healthcare Providers: BankingDealers.co.za  This test is not yet approved or  cleared by the [place] FDA and has been authorized for detection and/or diagnosis of SARS-CoV-2 by FDA under an Emergency Use Authorization (EUA).  This EUA will remain in effect (meaning this test can be used) for  the duration of the COVID-19 declaration under Section 564(b)(1) of the Act, 21 U.S.C. section 360bbb-3(b)(1), unless the authorization is terminated or revoked sooner.  Performed at [facility], [place] [address] [place], [place] [address]   SARS Coronavirus 2 by RT PCR (hospital order, performed in [facility] hospital lab) Nasopharyngeal Nasopharyngeal Swab     Status: None   Collection Time: 12/11/19  6:25 PM   Specimen: Nasopharyngeal Swab  Result Value Ref Range Status   SARS Coronavirus 2 NEGATIVE NEGATIVE Final    Comment: (NOTE) SARS-CoV-2 target nucleic acids are NOT DETECTED.  The SARS-CoV-2 RNA is generally detectable in upper and lower respiratory specimens during the acute phase of infection. The lowest concentration of SARS-CoV-2 viral copies this assay can detect is 250 copies / mL. A negative result does not preclude SARS-CoV-2 infection and should not be used as the sole basis for treatment or other patient management decisions.  A negative result may occur with improper specimen collection / handling, submission of specimen other than nasopharyngeal swab, presence of viral mutation(s) within the areas targeted by this assay, and inadequate number of viral copies (<250 copies / mL). A negative result must be combined with clinical observations, patient history, and epidemiological information.  Fact Sheet for  Patients:   StrictlyIdeas.no  Fact Sheet for Healthcare Providers: BankingDealers.co.za  This test is not yet approved or  cleared by the [place] FDA and has been authorized for detection and/or diagnosis of SARS-CoV-2 by FDA under an Emergency Use Authorization (EUA).  This EUA will remain in effect (meaning this test can be used) for the duration of the COVID-19 declaration under Section 564(b)(1) of the Act, 21 U.S.C. section 360bbb-3(b)(1), unless the authorization is terminated  or revoked sooner.  Performed at [facility], [place] [address] [place], [place] [address]      Labs: BNP (last 3 results) Recent Labs    03/24/19 1419  BNP 09.8   Basic Metabolic Panel: Recent Labs  Lab 12/06/19 0626 12/07/19 0449 12/08/19 0625 12/10/19 0517 12/12/19 0619  NA 136 139 140 135 138  K 2.7* 3.1* 4.2 3.9 3.7  CL 90* 95* 100 98 101  CO2 28 33* 30 27 27   GLUCOSE 92 114* 94 94 91  BUN 95* 79* 53* 46* 50*  CREATININE 2.83* 3.00* 2.35* 2.13* 2.46*  CALCIUM 8.8* 8.7* 8.6* 8.2* 8.7*  MG  --  2.8*  --  2.5*  --    Liver Function Tests: Recent Labs  Lab 12/06/19 0626 12/07/19 0449 12/12/19 0619  AST 20 18 19   ALT 17 17 22   ALKPHOS 71 66 53  BILITOT 1.1 0.9 0.5  PROT 6.1* 5.9* 6.0*  ALBUMIN 3.4* 2.9* 2.7*   No results for input(s): LIPASE, AMYLASE in the last 168 hours. No results for input(s): AMMONIA in the last 168 hours. CBC: Recent Labs  Lab 12/06/19 0626 12/07/19 0449 12/08/19 0625 12/10/19 0517 12/12/19 0619  WBC 13.0* 13.6* 11.7* 9.9 7.5  NEUTROABS 10.0* 10.5*  --   --   --   HGB 13.0 11.9* 12.4* 10.6* 11.2*  HCT 38.6* 36.1* 38.0* 31.8* 35.2*  MCV 96.0 98.1 97.7 95.8 100.0  PLT 181 163 145* 145* 186   Cardiac Enzymes: No results for input(s): CKTOTAL, CKMB, CKMBINDEX, TROPONINI in the last 168 hours. BNP: Invalid input(s): POCBNP CBG: Recent Labs  Lab 12/05/19 1922 12/11/19 1232 12/11/19 1652  GLUCAP 139* 106* 137*   D-Dimer No results for input(s): DDIMER in the last 72 hours. Hgb A1c No results for input(s): HGBA1C in the last 72 hours. Lipid Profile No results for input(s): CHOL, HDL, LDLCALC, TRIG, CHOLHDL, LDLDIRECT in the last 72 hours. Thyroid function studies No results for input(s): TSH, T4TOTAL, T3FREE, THYROIDAB in the last 72 hours.  Invalid input(s): FREET3 Anemia work up No results for input(s): VITAMINB12, FOLATE, FERRITIN, TIBC, IRON, RETICCTPCT in the last 72 hours. Urinalysis     Component Value Date/Time   COLORURINE YELLOW 12/05/2019 2300   APPEARANCEUR CLEAR 12/05/2019 2300   LABSPEC 1.011 12/05/2019 2300   PHURINE 5.0 12/05/2019 2300   GLUCOSEU NEGATIVE 12/05/2019 2300   HGBUR NEGATIVE 12/05/2019 2300   HGBUR negative 10/03/2009 1528   BILIRUBINUR NEGATIVE 12/05/2019 2300   BILIRUBINUR Negative 07/09/2019 1121   KETONESUR NEGATIVE 12/05/2019 2300   PROTEINUR NEGATIVE 12/05/2019 2300   UROBILINOGEN 0.2 07/09/2019 1121   UROBILINOGEN 0.2 10/03/2009 1528   NITRITE NEGATIVE 12/05/2019 2300   LEUKOCYTESUR NEGATIVE 12/05/2019 2300   Sepsis Labs Invalid input(s): PROCALCITONIN,  WBC,  LACTICIDVEN Microbiology Recent Results (from the past 240 hour(s))  SARS Coronavirus 2 by RT PCR (hospital order, performed in [facility] hospital lab) Nasopharyngeal Nasopharyngeal Swab     Status: None   Collection Time: 12/06/19  1:40 AM   Specimen: Nasopharyngeal Swab  Result Value Ref Range Status   SARS Coronavirus 2 NEGATIVE NEGATIVE Final    Comment: (NOTE) SARS-CoV-2 target nucleic acids are NOT DETECTED.  The SARS-CoV-2 RNA is generally detectable in upper and lower respiratory specimens during the acute phase of infection. The lowest concentration of SARS-CoV-2 viral copies this assay can detect is 250 copies / mL. A negative result does not preclude SARS-CoV-2 infection and should not be used as the sole basis for treatment or other patient management decisions.  A negative result may occur with improper specimen collection / handling, submission of specimen other than nasopharyngeal swab, presence of viral mutation(s) within the areas targeted by this assay, and inadequate number of viral copies (<250 copies / mL). A negative result must be combined with clinical observations, patient history, and epidemiological information.  Fact Sheet for Patients:   StrictlyIdeas.no  Fact Sheet for Healthcare  Providers: BankingDealers.co.za  This test is not yet approved or  cleared by the [place] FDA and has been authorized for detection and/or diagnosis of SARS-CoV-2 by FDA under an Emergency Use Authorization (EUA).  This EUA will remain in effect (meaning this test can be used) for the duration of the COVID-19 declaration under Section 564(b)(1) of the Act, 21 U.S.C. section 360bbb-3(b)(1), unless the authorization is terminated or revoked sooner.  Performed at [facility], [place] [address] [place], [place] [address]   SARS Coronavirus 2 by RT PCR (hospital order, performed in [facility] hospital lab) Nasopharyngeal Nasopharyngeal Swab     Status: None   Collection Time: 12/11/19  6:25 PM   Specimen: Nasopharyngeal Swab  Result Value Ref Range Status   SARS Coronavirus 2 NEGATIVE NEGATIVE Final    Comment: (NOTE) SARS-CoV-2 target nucleic acids are NOT DETECTED.  The SARS-CoV-2 RNA is generally detectable in upper and lower respiratory specimens during the acute phase of infection. The lowest concentration of SARS-CoV-2 viral copies this assay can detect is 250 copies / mL. A negative result does not preclude SARS-CoV-2 infection and should not be used as the sole basis for treatment or other patient management decisions.  A negative result may occur with improper specimen collection / handling, submission of specimen other than nasopharyngeal swab, presence of viral mutation(s) within the areas targeted by this assay, and inadequate number of viral copies (<250 copies / mL). A negative result must be combined with clinical observations, patient history, and epidemiological information.  Fact Sheet for Patients:   StrictlyIdeas.no  Fact Sheet for Healthcare Providers: BankingDealers.co.za  This test is not yet approved or  cleared by the [place] FDA and has been authorized for  detection and/or diagnosis of SARS-CoV-2 by FDA under an Emergency Use Authorization (EUA).  This EUA will remain in effect (meaning this test can be used) for the duration of the COVID-19 declaration under Section 564(b)(1) of the Act, 21 U.S.C. section 360bbb-3(b)(1), unless the authorization is terminated or revoked sooner.  Performed at [facility], [place] [address] [place], [place] [address]      Time coordinating discharge: 39 minutes  SIGNED:   Georgette Shell, MD  [facility] 12/12/2019, 10:24 AM

## 2019-12-13 ENCOUNTER — Telehealth: Payer: Self-pay

## 2019-12-13 DIAGNOSIS — N281 Cyst of kidney, acquired: Secondary | ICD-10-CM | POA: Diagnosis not present

## 2019-12-13 DIAGNOSIS — N184 Chronic kidney disease, stage 4 (severe): Secondary | ICD-10-CM | POA: Diagnosis not present

## 2019-12-13 DIAGNOSIS — K5792 Diverticulitis of intestine, part unspecified, without perforation or abscess without bleeding: Secondary | ICD-10-CM | POA: Diagnosis not present

## 2019-12-13 DIAGNOSIS — G4733 Obstructive sleep apnea (adult) (pediatric): Secondary | ICD-10-CM | POA: Diagnosis not present

## 2019-12-13 NOTE — Telephone Encounter (Cosign Needed)
Transition Care Management Follow-up Telephone Call  Date of discharge and from where: 12/12/19 [facility]  How have you been since you were released from the hospital? Pt stated he is still not doing well and he states he is in [facility] for rehab, his disposition says home.  Any questions or concerns? No  Items Reviewed:  Did the pt receive and understand the discharge instructions provided? Yes   Medications obtained and verified? No   Any new allergies since your discharge? No   Dietary orders reviewed? Yes  Do you have support at home? Pt stated he is in a rehab faucilty  Functional Questionnaire: (I = Independent and D = Dependent) ADLs: D  Bathing/Dressing- D  Meal Prep- D  Eating- D  Maintaining continence- D  Transferring/Ambulation- D  Managing Meds- D  Follow up appointments reviewed:   PCP Hospital f/u appt confirmed? No  not scheduled    Specialist Hospital f/u appt confirmed? No   Are transportation arrangements needed? No   If their condition worsens, is the pt aware to call PCP or go to the Emergency Dept.? Yes  Was the patient provided with contact information for the PCP's office or ED? Yes  Was to pt encouraged to call back with questions or concerns? Yes

## 2019-12-14 DIAGNOSIS — R7989 Other specified abnormal findings of blood chemistry: Secondary | ICD-10-CM | POA: Diagnosis not present

## 2019-12-18 DIAGNOSIS — K5792 Diverticulitis of intestine, part unspecified, without perforation or abscess without bleeding: Secondary | ICD-10-CM | POA: Diagnosis not present

## 2019-12-18 DIAGNOSIS — G4733 Obstructive sleep apnea (adult) (pediatric): Secondary | ICD-10-CM | POA: Diagnosis not present

## 2019-12-18 DIAGNOSIS — N184 Chronic kidney disease, stage 4 (severe): Secondary | ICD-10-CM | POA: Diagnosis not present

## 2019-12-24 DIAGNOSIS — M1711 Unilateral primary osteoarthritis, right knee: Secondary | ICD-10-CM | POA: Diagnosis not present

## 2019-12-24 DIAGNOSIS — M4316 Spondylolisthesis, lumbar region: Secondary | ICD-10-CM | POA: Diagnosis not present

## 2019-12-24 DIAGNOSIS — N184 Chronic kidney disease, stage 4 (severe): Secondary | ICD-10-CM | POA: Diagnosis not present

## 2019-12-24 DIAGNOSIS — K5909 Other constipation: Secondary | ICD-10-CM | POA: Diagnosis not present

## 2019-12-24 DIAGNOSIS — G4733 Obstructive sleep apnea (adult) (pediatric): Secondary | ICD-10-CM | POA: Diagnosis not present

## 2019-12-24 DIAGNOSIS — Z609 Problem related to social environment, unspecified: Secondary | ICD-10-CM | POA: Diagnosis not present

## 2019-12-24 DIAGNOSIS — G8929 Other chronic pain: Secondary | ICD-10-CM | POA: Diagnosis not present

## 2019-12-24 DIAGNOSIS — M810 Age-related osteoporosis without current pathological fracture: Secondary | ICD-10-CM | POA: Diagnosis not present

## 2019-12-24 DIAGNOSIS — M545 Low back pain, unspecified: Secondary | ICD-10-CM | POA: Diagnosis not present

## 2019-12-24 DIAGNOSIS — R109 Unspecified abdominal pain: Secondary | ICD-10-CM | POA: Diagnosis not present

## 2019-12-24 DIAGNOSIS — M109 Gout, unspecified: Secondary | ICD-10-CM | POA: Diagnosis not present

## 2019-12-24 DIAGNOSIS — E876 Hypokalemia: Secondary | ICD-10-CM | POA: Diagnosis not present

## 2019-12-24 DIAGNOSIS — I5189 Other ill-defined heart diseases: Secondary | ICD-10-CM | POA: Diagnosis not present

## 2019-12-24 DIAGNOSIS — K5792 Diverticulitis of intestine, part unspecified, without perforation or abscess without bleeding: Secondary | ICD-10-CM | POA: Diagnosis not present

## 2019-12-24 DIAGNOSIS — R1319 Other dysphagia: Secondary | ICD-10-CM | POA: Diagnosis not present

## 2019-12-24 DIAGNOSIS — R1310 Dysphagia, unspecified: Secondary | ICD-10-CM | POA: Diagnosis not present

## 2019-12-24 DIAGNOSIS — I10 Essential (primary) hypertension: Secondary | ICD-10-CM | POA: Diagnosis not present

## 2019-12-25 DIAGNOSIS — M545 Low back pain, unspecified: Secondary | ICD-10-CM | POA: Diagnosis not present

## 2019-12-25 DIAGNOSIS — I5189 Other ill-defined heart diseases: Secondary | ICD-10-CM | POA: Diagnosis not present

## 2019-12-25 DIAGNOSIS — I10 Essential (primary) hypertension: Secondary | ICD-10-CM | POA: Diagnosis not present

## 2019-12-25 DIAGNOSIS — M810 Age-related osteoporosis without current pathological fracture: Secondary | ICD-10-CM | POA: Diagnosis not present

## 2019-12-25 DIAGNOSIS — R109 Unspecified abdominal pain: Secondary | ICD-10-CM | POA: Diagnosis not present

## 2019-12-25 DIAGNOSIS — K5792 Diverticulitis of intestine, part unspecified, without perforation or abscess without bleeding: Secondary | ICD-10-CM | POA: Diagnosis not present

## 2019-12-25 DIAGNOSIS — G4733 Obstructive sleep apnea (adult) (pediatric): Secondary | ICD-10-CM | POA: Diagnosis not present

## 2019-12-25 DIAGNOSIS — R1310 Dysphagia, unspecified: Secondary | ICD-10-CM | POA: Diagnosis not present

## 2019-12-25 DIAGNOSIS — K5909 Other constipation: Secondary | ICD-10-CM | POA: Diagnosis not present

## 2019-12-25 DIAGNOSIS — M4316 Spondylolisthesis, lumbar region: Secondary | ICD-10-CM | POA: Diagnosis not present

## 2019-12-25 DIAGNOSIS — Z609 Problem related to social environment, unspecified: Secondary | ICD-10-CM | POA: Diagnosis not present

## 2019-12-25 DIAGNOSIS — E876 Hypokalemia: Secondary | ICD-10-CM | POA: Diagnosis not present

## 2019-12-25 DIAGNOSIS — M1711 Unilateral primary osteoarthritis, right knee: Secondary | ICD-10-CM | POA: Diagnosis not present

## 2019-12-25 DIAGNOSIS — N184 Chronic kidney disease, stage 4 (severe): Secondary | ICD-10-CM | POA: Diagnosis not present

## 2019-12-25 DIAGNOSIS — M109 Gout, unspecified: Secondary | ICD-10-CM | POA: Diagnosis not present

## 2019-12-25 DIAGNOSIS — G8929 Other chronic pain: Secondary | ICD-10-CM | POA: Diagnosis not present

## 2019-12-26 DIAGNOSIS — N184 Chronic kidney disease, stage 4 (severe): Secondary | ICD-10-CM | POA: Diagnosis not present

## 2019-12-26 DIAGNOSIS — G4733 Obstructive sleep apnea (adult) (pediatric): Secondary | ICD-10-CM | POA: Diagnosis not present

## 2019-12-26 DIAGNOSIS — M810 Age-related osteoporosis without current pathological fracture: Secondary | ICD-10-CM | POA: Diagnosis not present

## 2019-12-26 DIAGNOSIS — K5792 Diverticulitis of intestine, part unspecified, without perforation or abscess without bleeding: Secondary | ICD-10-CM | POA: Diagnosis not present

## 2019-12-26 DIAGNOSIS — I10 Essential (primary) hypertension: Secondary | ICD-10-CM | POA: Diagnosis not present

## 2019-12-26 DIAGNOSIS — M1711 Unilateral primary osteoarthritis, right knee: Secondary | ICD-10-CM | POA: Diagnosis not present

## 2019-12-26 DIAGNOSIS — M4316 Spondylolisthesis, lumbar region: Secondary | ICD-10-CM | POA: Diagnosis not present

## 2019-12-26 DIAGNOSIS — E876 Hypokalemia: Secondary | ICD-10-CM | POA: Diagnosis not present

## 2019-12-26 DIAGNOSIS — I5189 Other ill-defined heart diseases: Secondary | ICD-10-CM | POA: Diagnosis not present

## 2019-12-26 DIAGNOSIS — M545 Low back pain, unspecified: Secondary | ICD-10-CM | POA: Diagnosis not present

## 2019-12-26 DIAGNOSIS — G8929 Other chronic pain: Secondary | ICD-10-CM | POA: Diagnosis not present

## 2019-12-26 DIAGNOSIS — R109 Unspecified abdominal pain: Secondary | ICD-10-CM | POA: Diagnosis not present

## 2019-12-26 DIAGNOSIS — K5909 Other constipation: Secondary | ICD-10-CM | POA: Diagnosis not present

## 2019-12-26 DIAGNOSIS — Z609 Problem related to social environment, unspecified: Secondary | ICD-10-CM | POA: Diagnosis not present

## 2019-12-26 DIAGNOSIS — R1310 Dysphagia, unspecified: Secondary | ICD-10-CM | POA: Diagnosis not present

## 2019-12-26 DIAGNOSIS — M109 Gout, unspecified: Secondary | ICD-10-CM | POA: Diagnosis not present

## 2019-12-27 DIAGNOSIS — R1310 Dysphagia, unspecified: Secondary | ICD-10-CM | POA: Diagnosis not present

## 2019-12-27 DIAGNOSIS — M1711 Unilateral primary osteoarthritis, right knee: Secondary | ICD-10-CM | POA: Diagnosis not present

## 2019-12-27 DIAGNOSIS — N189 Chronic kidney disease, unspecified: Secondary | ICD-10-CM | POA: Diagnosis not present

## 2019-12-27 DIAGNOSIS — I5189 Other ill-defined heart diseases: Secondary | ICD-10-CM | POA: Diagnosis not present

## 2019-12-27 DIAGNOSIS — N184 Chronic kidney disease, stage 4 (severe): Secondary | ICD-10-CM | POA: Diagnosis not present

## 2019-12-27 DIAGNOSIS — R109 Unspecified abdominal pain: Secondary | ICD-10-CM | POA: Diagnosis not present

## 2019-12-27 DIAGNOSIS — M109 Gout, unspecified: Secondary | ICD-10-CM | POA: Diagnosis not present

## 2019-12-27 DIAGNOSIS — E876 Hypokalemia: Secondary | ICD-10-CM | POA: Diagnosis not present

## 2019-12-27 DIAGNOSIS — G8929 Other chronic pain: Secondary | ICD-10-CM | POA: Diagnosis not present

## 2019-12-27 DIAGNOSIS — M810 Age-related osteoporosis without current pathological fracture: Secondary | ICD-10-CM | POA: Diagnosis not present

## 2019-12-27 DIAGNOSIS — G4733 Obstructive sleep apnea (adult) (pediatric): Secondary | ICD-10-CM | POA: Diagnosis not present

## 2019-12-27 DIAGNOSIS — K5792 Diverticulitis of intestine, part unspecified, without perforation or abscess without bleeding: Secondary | ICD-10-CM | POA: Diagnosis not present

## 2019-12-27 DIAGNOSIS — I10 Essential (primary) hypertension: Secondary | ICD-10-CM | POA: Diagnosis not present

## 2019-12-27 DIAGNOSIS — M545 Low back pain, unspecified: Secondary | ICD-10-CM | POA: Diagnosis not present

## 2019-12-27 DIAGNOSIS — K5909 Other constipation: Secondary | ICD-10-CM | POA: Diagnosis not present

## 2019-12-27 DIAGNOSIS — M1990 Unspecified osteoarthritis, unspecified site: Secondary | ICD-10-CM | POA: Diagnosis not present

## 2019-12-27 DIAGNOSIS — Z609 Problem related to social environment, unspecified: Secondary | ICD-10-CM | POA: Diagnosis not present

## 2019-12-27 DIAGNOSIS — M4316 Spondylolisthesis, lumbar region: Secondary | ICD-10-CM | POA: Diagnosis not present

## 2019-12-27 DIAGNOSIS — R609 Edema, unspecified: Secondary | ICD-10-CM | POA: Diagnosis not present

## 2019-12-28 DIAGNOSIS — Z609 Problem related to social environment, unspecified: Secondary | ICD-10-CM | POA: Diagnosis not present

## 2019-12-28 DIAGNOSIS — G8929 Other chronic pain: Secondary | ICD-10-CM | POA: Diagnosis not present

## 2019-12-28 DIAGNOSIS — R1310 Dysphagia, unspecified: Secondary | ICD-10-CM | POA: Diagnosis not present

## 2019-12-28 DIAGNOSIS — K5792 Diverticulitis of intestine, part unspecified, without perforation or abscess without bleeding: Secondary | ICD-10-CM | POA: Diagnosis not present

## 2019-12-28 DIAGNOSIS — R109 Unspecified abdominal pain: Secondary | ICD-10-CM | POA: Diagnosis not present

## 2019-12-28 DIAGNOSIS — M1711 Unilateral primary osteoarthritis, right knee: Secondary | ICD-10-CM | POA: Diagnosis not present

## 2019-12-28 DIAGNOSIS — E876 Hypokalemia: Secondary | ICD-10-CM | POA: Diagnosis not present

## 2019-12-28 DIAGNOSIS — I5189 Other ill-defined heart diseases: Secondary | ICD-10-CM | POA: Diagnosis not present

## 2019-12-28 DIAGNOSIS — N184 Chronic kidney disease, stage 4 (severe): Secondary | ICD-10-CM | POA: Diagnosis not present

## 2019-12-28 DIAGNOSIS — M810 Age-related osteoporosis without current pathological fracture: Secondary | ICD-10-CM | POA: Diagnosis not present

## 2019-12-28 DIAGNOSIS — I10 Essential (primary) hypertension: Secondary | ICD-10-CM | POA: Diagnosis not present

## 2019-12-28 DIAGNOSIS — M4316 Spondylolisthesis, lumbar region: Secondary | ICD-10-CM | POA: Diagnosis not present

## 2019-12-28 DIAGNOSIS — M545 Low back pain, unspecified: Secondary | ICD-10-CM | POA: Diagnosis not present

## 2019-12-28 DIAGNOSIS — M109 Gout, unspecified: Secondary | ICD-10-CM | POA: Diagnosis not present

## 2019-12-28 DIAGNOSIS — K5909 Other constipation: Secondary | ICD-10-CM | POA: Diagnosis not present

## 2019-12-28 DIAGNOSIS — G4733 Obstructive sleep apnea (adult) (pediatric): Secondary | ICD-10-CM | POA: Diagnosis not present

## 2019-12-30 DIAGNOSIS — G4733 Obstructive sleep apnea (adult) (pediatric): Secondary | ICD-10-CM | POA: Diagnosis not present

## 2019-12-30 DIAGNOSIS — D631 Anemia in chronic kidney disease: Secondary | ICD-10-CM | POA: Diagnosis not present

## 2019-12-30 DIAGNOSIS — M170 Bilateral primary osteoarthritis of knee: Secondary | ICD-10-CM | POA: Diagnosis not present

## 2019-12-30 DIAGNOSIS — N184 Chronic kidney disease, stage 4 (severe): Secondary | ICD-10-CM | POA: Diagnosis not present

## 2020-01-01 ENCOUNTER — Ambulatory Visit: Payer: Medicare Other | Admitting: Physician Assistant

## 2020-01-01 DIAGNOSIS — N184 Chronic kidney disease, stage 4 (severe): Secondary | ICD-10-CM | POA: Diagnosis not present

## 2020-01-01 DIAGNOSIS — M170 Bilateral primary osteoarthritis of knee: Secondary | ICD-10-CM | POA: Diagnosis not present

## 2020-01-01 DIAGNOSIS — G4733 Obstructive sleep apnea (adult) (pediatric): Secondary | ICD-10-CM | POA: Diagnosis not present

## 2020-01-01 DIAGNOSIS — D631 Anemia in chronic kidney disease: Secondary | ICD-10-CM | POA: Diagnosis not present

## 2020-01-03 DIAGNOSIS — D631 Anemia in chronic kidney disease: Secondary | ICD-10-CM | POA: Diagnosis not present

## 2020-01-03 DIAGNOSIS — N184 Chronic kidney disease, stage 4 (severe): Secondary | ICD-10-CM | POA: Diagnosis not present

## 2020-01-03 DIAGNOSIS — G4733 Obstructive sleep apnea (adult) (pediatric): Secondary | ICD-10-CM | POA: Diagnosis not present

## 2020-01-03 DIAGNOSIS — M170 Bilateral primary osteoarthritis of knee: Secondary | ICD-10-CM | POA: Diagnosis not present

## 2020-01-07 DIAGNOSIS — N184 Chronic kidney disease, stage 4 (severe): Secondary | ICD-10-CM | POA: Diagnosis not present

## 2020-01-07 DIAGNOSIS — D631 Anemia in chronic kidney disease: Secondary | ICD-10-CM | POA: Diagnosis not present

## 2020-01-07 DIAGNOSIS — G4733 Obstructive sleep apnea (adult) (pediatric): Secondary | ICD-10-CM | POA: Diagnosis not present

## 2020-01-07 DIAGNOSIS — M170 Bilateral primary osteoarthritis of knee: Secondary | ICD-10-CM | POA: Diagnosis not present

## 2020-01-08 DIAGNOSIS — N184 Chronic kidney disease, stage 4 (severe): Secondary | ICD-10-CM | POA: Diagnosis not present

## 2020-01-08 DIAGNOSIS — M170 Bilateral primary osteoarthritis of knee: Secondary | ICD-10-CM | POA: Diagnosis not present

## 2020-01-09 DIAGNOSIS — M170 Bilateral primary osteoarthritis of knee: Secondary | ICD-10-CM | POA: Diagnosis not present

## 2020-01-09 DIAGNOSIS — D631 Anemia in chronic kidney disease: Secondary | ICD-10-CM | POA: Diagnosis not present

## 2020-01-09 DIAGNOSIS — G4733 Obstructive sleep apnea (adult) (pediatric): Secondary | ICD-10-CM | POA: Diagnosis not present

## 2020-01-09 DIAGNOSIS — N184 Chronic kidney disease, stage 4 (severe): Secondary | ICD-10-CM | POA: Diagnosis not present

## 2020-01-15 ENCOUNTER — Telehealth: Payer: Self-pay

## 2020-01-15 NOTE — Telephone Encounter (Signed)
Patient has been re-enrolled with [employer] for Prolia. No PA needed, but looks like it was previously stopped due to CKD IV. Please advise if patient should be scheduled to Prolia or not.

## 2020-01-15 NOTE — Telephone Encounter (Signed)
Not a candidate-endocrinology has actually recommended referral to a tertiary care center for his osteoporosis but he has declined.  It looks like he is actually being seen in [place]-can you check on patient and see if he has moved?

## 2020-01-15 NOTE — Telephone Encounter (Signed)
Noted will not move forward with prolia then. We will certainly miss him

## 2020-01-15 NOTE — Telephone Encounter (Signed)
Patient has moved to [place]

## 2020-01-16 DIAGNOSIS — D631 Anemia in chronic kidney disease: Secondary | ICD-10-CM | POA: Diagnosis not present

## 2020-01-16 DIAGNOSIS — G4733 Obstructive sleep apnea (adult) (pediatric): Secondary | ICD-10-CM | POA: Diagnosis not present

## 2020-01-16 DIAGNOSIS — M170 Bilateral primary osteoarthritis of knee: Secondary | ICD-10-CM | POA: Diagnosis not present

## 2020-01-16 DIAGNOSIS — N184 Chronic kidney disease, stage 4 (severe): Secondary | ICD-10-CM | POA: Diagnosis not present

## 2020-01-23 DIAGNOSIS — N184 Chronic kidney disease, stage 4 (severe): Secondary | ICD-10-CM | POA: Diagnosis not present

## 2020-01-23 DIAGNOSIS — M170 Bilateral primary osteoarthritis of knee: Secondary | ICD-10-CM | POA: Diagnosis not present

## 2020-01-23 DIAGNOSIS — G4733 Obstructive sleep apnea (adult) (pediatric): Secondary | ICD-10-CM | POA: Diagnosis not present

## 2020-01-23 DIAGNOSIS — D631 Anemia in chronic kidney disease: Secondary | ICD-10-CM | POA: Diagnosis not present

## 2020-01-25 DIAGNOSIS — G4733 Obstructive sleep apnea (adult) (pediatric): Secondary | ICD-10-CM | POA: Diagnosis not present

## 2020-02-01 DIAGNOSIS — K5909 Other constipation: Secondary | ICD-10-CM | POA: Diagnosis not present

## 2020-02-01 DIAGNOSIS — M1990 Unspecified osteoarthritis, unspecified site: Secondary | ICD-10-CM | POA: Diagnosis not present

## 2020-02-01 DIAGNOSIS — M545 Low back pain, unspecified: Secondary | ICD-10-CM | POA: Diagnosis not present

## 2020-02-01 DIAGNOSIS — M810 Age-related osteoporosis without current pathological fracture: Secondary | ICD-10-CM | POA: Diagnosis not present

## 2020-02-01 DIAGNOSIS — Z609 Problem related to social environment, unspecified: Secondary | ICD-10-CM | POA: Diagnosis not present

## 2020-02-01 DIAGNOSIS — R1310 Dysphagia, unspecified: Secondary | ICD-10-CM | POA: Diagnosis not present

## 2020-02-01 DIAGNOSIS — E876 Hypokalemia: Secondary | ICD-10-CM | POA: Diagnosis not present

## 2020-02-01 DIAGNOSIS — R109 Unspecified abdominal pain: Secondary | ICD-10-CM | POA: Diagnosis not present

## 2020-02-01 DIAGNOSIS — K5792 Diverticulitis of intestine, part unspecified, without perforation or abscess without bleeding: Secondary | ICD-10-CM | POA: Diagnosis not present

## 2020-02-01 DIAGNOSIS — G4733 Obstructive sleep apnea (adult) (pediatric): Secondary | ICD-10-CM | POA: Diagnosis not present

## 2020-02-01 DIAGNOSIS — M4316 Spondylolisthesis, lumbar region: Secondary | ICD-10-CM | POA: Diagnosis not present

## 2020-02-01 DIAGNOSIS — M1711 Unilateral primary osteoarthritis, right knee: Secondary | ICD-10-CM | POA: Diagnosis not present

## 2020-02-01 DIAGNOSIS — M109 Gout, unspecified: Secondary | ICD-10-CM | POA: Diagnosis not present

## 2020-02-01 DIAGNOSIS — N184 Chronic kidney disease, stage 4 (severe): Secondary | ICD-10-CM | POA: Diagnosis not present

## 2020-02-01 DIAGNOSIS — I10 Essential (primary) hypertension: Secondary | ICD-10-CM | POA: Diagnosis not present

## 2020-02-01 DIAGNOSIS — G8929 Other chronic pain: Secondary | ICD-10-CM | POA: Diagnosis not present

## 2020-02-05 ENCOUNTER — Telehealth: Payer: Self-pay

## 2020-02-05 DIAGNOSIS — N184 Chronic kidney disease, stage 4 (severe): Secondary | ICD-10-CM | POA: Diagnosis not present

## 2020-02-05 DIAGNOSIS — M1990 Unspecified osteoarthritis, unspecified site: Secondary | ICD-10-CM | POA: Diagnosis not present

## 2020-02-05 DIAGNOSIS — G4733 Obstructive sleep apnea (adult) (pediatric): Secondary | ICD-10-CM | POA: Diagnosis not present

## 2020-02-05 DIAGNOSIS — M545 Low back pain, unspecified: Secondary | ICD-10-CM | POA: Diagnosis not present

## 2020-02-05 DIAGNOSIS — E876 Hypokalemia: Secondary | ICD-10-CM | POA: Diagnosis not present

## 2020-02-05 DIAGNOSIS — M4316 Spondylolisthesis, lumbar region: Secondary | ICD-10-CM | POA: Diagnosis not present

## 2020-02-05 DIAGNOSIS — I10 Essential (primary) hypertension: Secondary | ICD-10-CM | POA: Diagnosis not present

## 2020-02-05 DIAGNOSIS — R1310 Dysphagia, unspecified: Secondary | ICD-10-CM | POA: Diagnosis not present

## 2020-02-05 DIAGNOSIS — M810 Age-related osteoporosis without current pathological fracture: Secondary | ICD-10-CM | POA: Diagnosis not present

## 2020-02-05 DIAGNOSIS — G8929 Other chronic pain: Secondary | ICD-10-CM | POA: Diagnosis not present

## 2020-02-05 DIAGNOSIS — M1711 Unilateral primary osteoarthritis, right knee: Secondary | ICD-10-CM | POA: Diagnosis not present

## 2020-02-05 DIAGNOSIS — K5792 Diverticulitis of intestine, part unspecified, without perforation or abscess without bleeding: Secondary | ICD-10-CM | POA: Diagnosis not present

## 2020-02-05 DIAGNOSIS — M109 Gout, unspecified: Secondary | ICD-10-CM | POA: Diagnosis not present

## 2020-02-05 DIAGNOSIS — Z609 Problem related to social environment, unspecified: Secondary | ICD-10-CM | POA: Diagnosis not present

## 2020-02-05 DIAGNOSIS — K5909 Other constipation: Secondary | ICD-10-CM | POA: Diagnosis not present

## 2020-02-05 DIAGNOSIS — R109 Unspecified abdominal pain: Secondary | ICD-10-CM | POA: Diagnosis not present

## 2020-02-05 NOTE — Telephone Encounter (Signed)
.   LAST APPOINTMENT DATE: 01/15/2020   NEXT APPOINTMENT DATE:@12 /10/2019  MEDICATION: torsemide (DEMADEX) 20 MG tablet  PHARMACY:[id number] - [place], [place] - [address]  **Let patient know to contact pharmacy at the end of the day to make sure medication is ready. **  ** Please notify patient to allow 48-72 hours to process**  **Encourage patient to contact the pharmacy for refills or they can request refills through [facility]**  CLINICAL FILLS OUT ALL BELOW:   LAST REFILL:  QTY:  REFILL DATE:    OTHER COMMENTS:    Okay for refill?  Please advise

## 2020-02-06 DIAGNOSIS — M4316 Spondylolisthesis, lumbar region: Secondary | ICD-10-CM | POA: Diagnosis not present

## 2020-02-06 DIAGNOSIS — E876 Hypokalemia: Secondary | ICD-10-CM | POA: Diagnosis not present

## 2020-02-06 DIAGNOSIS — M810 Age-related osteoporosis without current pathological fracture: Secondary | ICD-10-CM | POA: Diagnosis not present

## 2020-02-06 DIAGNOSIS — I10 Essential (primary) hypertension: Secondary | ICD-10-CM | POA: Diagnosis not present

## 2020-02-06 DIAGNOSIS — M1990 Unspecified osteoarthritis, unspecified site: Secondary | ICD-10-CM | POA: Diagnosis not present

## 2020-02-06 DIAGNOSIS — R109 Unspecified abdominal pain: Secondary | ICD-10-CM | POA: Diagnosis not present

## 2020-02-06 DIAGNOSIS — K5909 Other constipation: Secondary | ICD-10-CM | POA: Diagnosis not present

## 2020-02-06 DIAGNOSIS — M109 Gout, unspecified: Secondary | ICD-10-CM | POA: Diagnosis not present

## 2020-02-06 DIAGNOSIS — G4733 Obstructive sleep apnea (adult) (pediatric): Secondary | ICD-10-CM | POA: Diagnosis not present

## 2020-02-06 DIAGNOSIS — Z609 Problem related to social environment, unspecified: Secondary | ICD-10-CM | POA: Diagnosis not present

## 2020-02-06 DIAGNOSIS — M545 Low back pain, unspecified: Secondary | ICD-10-CM | POA: Diagnosis not present

## 2020-02-06 DIAGNOSIS — G8929 Other chronic pain: Secondary | ICD-10-CM | POA: Diagnosis not present

## 2020-02-06 DIAGNOSIS — N184 Chronic kidney disease, stage 4 (severe): Secondary | ICD-10-CM | POA: Diagnosis not present

## 2020-02-06 DIAGNOSIS — K5792 Diverticulitis of intestine, part unspecified, without perforation or abscess without bleeding: Secondary | ICD-10-CM | POA: Diagnosis not present

## 2020-02-06 DIAGNOSIS — M1711 Unilateral primary osteoarthritis, right knee: Secondary | ICD-10-CM | POA: Diagnosis not present

## 2020-02-06 DIAGNOSIS — R1310 Dysphagia, unspecified: Secondary | ICD-10-CM | POA: Diagnosis not present

## 2020-02-06 MED ORDER — TORSEMIDE 20 MG PO TABS
20.0000 mg | ORAL_TABLET | Freq: Every day | ORAL | 0 refills | Status: DC
Start: 2020-02-06 — End: 2020-03-12

## 2020-02-06 NOTE — Telephone Encounter (Signed)
Rx sent in

## 2020-02-11 DIAGNOSIS — Z8781 Personal history of (healed) traumatic fracture: Secondary | ICD-10-CM | POA: Diagnosis not present

## 2020-02-11 DIAGNOSIS — R109 Unspecified abdominal pain: Secondary | ICD-10-CM | POA: Diagnosis not present

## 2020-02-11 DIAGNOSIS — I10 Essential (primary) hypertension: Secondary | ICD-10-CM | POA: Diagnosis not present

## 2020-02-11 DIAGNOSIS — G4733 Obstructive sleep apnea (adult) (pediatric): Secondary | ICD-10-CM | POA: Diagnosis not present

## 2020-02-11 DIAGNOSIS — M1711 Unilateral primary osteoarthritis, right knee: Secondary | ICD-10-CM | POA: Diagnosis not present

## 2020-02-11 DIAGNOSIS — R1310 Dysphagia, unspecified: Secondary | ICD-10-CM | POA: Diagnosis not present

## 2020-02-11 DIAGNOSIS — K5909 Other constipation: Secondary | ICD-10-CM | POA: Diagnosis not present

## 2020-02-11 DIAGNOSIS — G8929 Other chronic pain: Secondary | ICD-10-CM | POA: Diagnosis not present

## 2020-02-11 DIAGNOSIS — M810 Age-related osteoporosis without current pathological fracture: Secondary | ICD-10-CM | POA: Diagnosis not present

## 2020-02-11 DIAGNOSIS — M545 Low back pain, unspecified: Secondary | ICD-10-CM | POA: Diagnosis not present

## 2020-02-11 DIAGNOSIS — N184 Chronic kidney disease, stage 4 (severe): Secondary | ICD-10-CM | POA: Diagnosis not present

## 2020-02-11 DIAGNOSIS — M4316 Spondylolisthesis, lumbar region: Secondary | ICD-10-CM | POA: Diagnosis not present

## 2020-02-11 DIAGNOSIS — M1611 Unilateral primary osteoarthritis, right hip: Secondary | ICD-10-CM | POA: Diagnosis not present

## 2020-02-11 DIAGNOSIS — K5792 Diverticulitis of intestine, part unspecified, without perforation or abscess without bleeding: Secondary | ICD-10-CM | POA: Diagnosis not present

## 2020-02-11 DIAGNOSIS — M109 Gout, unspecified: Secondary | ICD-10-CM | POA: Diagnosis not present

## 2020-02-11 DIAGNOSIS — M47818 Spondylosis without myelopathy or radiculopathy, sacral and sacrococcygeal region: Secondary | ICD-10-CM | POA: Diagnosis not present

## 2020-02-11 DIAGNOSIS — M160 Bilateral primary osteoarthritis of hip: Secondary | ICD-10-CM | POA: Diagnosis not present

## 2020-02-11 DIAGNOSIS — Z981 Arthrodesis status: Secondary | ICD-10-CM | POA: Diagnosis not present

## 2020-02-11 DIAGNOSIS — M1990 Unspecified osteoarthritis, unspecified site: Secondary | ICD-10-CM | POA: Diagnosis not present

## 2020-02-11 DIAGNOSIS — N289 Disorder of kidney and ureter, unspecified: Secondary | ICD-10-CM | POA: Diagnosis not present

## 2020-02-11 DIAGNOSIS — Z609 Problem related to social environment, unspecified: Secondary | ICD-10-CM | POA: Diagnosis not present

## 2020-02-11 DIAGNOSIS — M818 Other osteoporosis without current pathological fracture: Secondary | ICD-10-CM | POA: Diagnosis not present

## 2020-02-11 DIAGNOSIS — M8588 Other specified disorders of bone density and structure, other site: Secondary | ICD-10-CM | POA: Diagnosis not present

## 2020-02-11 DIAGNOSIS — M25551 Pain in right hip: Secondary | ICD-10-CM | POA: Diagnosis not present

## 2020-02-11 DIAGNOSIS — E876 Hypokalemia: Secondary | ICD-10-CM | POA: Diagnosis not present

## 2020-02-13 DIAGNOSIS — M4316 Spondylolisthesis, lumbar region: Secondary | ICD-10-CM | POA: Diagnosis not present

## 2020-02-13 DIAGNOSIS — G8929 Other chronic pain: Secondary | ICD-10-CM | POA: Diagnosis not present

## 2020-02-13 DIAGNOSIS — K5792 Diverticulitis of intestine, part unspecified, without perforation or abscess without bleeding: Secondary | ICD-10-CM | POA: Diagnosis not present

## 2020-02-13 DIAGNOSIS — E876 Hypokalemia: Secondary | ICD-10-CM | POA: Diagnosis not present

## 2020-02-13 DIAGNOSIS — M810 Age-related osteoporosis without current pathological fracture: Secondary | ICD-10-CM | POA: Diagnosis not present

## 2020-02-13 DIAGNOSIS — R109 Unspecified abdominal pain: Secondary | ICD-10-CM | POA: Diagnosis not present

## 2020-02-13 DIAGNOSIS — M109 Gout, unspecified: Secondary | ICD-10-CM | POA: Diagnosis not present

## 2020-02-13 DIAGNOSIS — N184 Chronic kidney disease, stage 4 (severe): Secondary | ICD-10-CM | POA: Diagnosis not present

## 2020-02-13 DIAGNOSIS — G4733 Obstructive sleep apnea (adult) (pediatric): Secondary | ICD-10-CM | POA: Diagnosis not present

## 2020-02-13 DIAGNOSIS — Z609 Problem related to social environment, unspecified: Secondary | ICD-10-CM | POA: Diagnosis not present

## 2020-02-13 DIAGNOSIS — M545 Low back pain, unspecified: Secondary | ICD-10-CM | POA: Diagnosis not present

## 2020-02-13 DIAGNOSIS — I10 Essential (primary) hypertension: Secondary | ICD-10-CM | POA: Diagnosis not present

## 2020-02-13 DIAGNOSIS — R1310 Dysphagia, unspecified: Secondary | ICD-10-CM | POA: Diagnosis not present

## 2020-02-13 DIAGNOSIS — M1990 Unspecified osteoarthritis, unspecified site: Secondary | ICD-10-CM | POA: Diagnosis not present

## 2020-02-13 DIAGNOSIS — K5909 Other constipation: Secondary | ICD-10-CM | POA: Diagnosis not present

## 2020-02-13 DIAGNOSIS — M1711 Unilateral primary osteoarthritis, right knee: Secondary | ICD-10-CM | POA: Diagnosis not present

## 2020-02-15 DIAGNOSIS — M1711 Unilateral primary osteoarthritis, right knee: Secondary | ICD-10-CM | POA: Diagnosis not present

## 2020-02-15 DIAGNOSIS — M109 Gout, unspecified: Secondary | ICD-10-CM | POA: Diagnosis not present

## 2020-02-15 DIAGNOSIS — R1310 Dysphagia, unspecified: Secondary | ICD-10-CM | POA: Diagnosis not present

## 2020-02-15 DIAGNOSIS — M545 Low back pain, unspecified: Secondary | ICD-10-CM | POA: Diagnosis not present

## 2020-02-15 DIAGNOSIS — G8929 Other chronic pain: Secondary | ICD-10-CM | POA: Diagnosis not present

## 2020-02-15 DIAGNOSIS — G4733 Obstructive sleep apnea (adult) (pediatric): Secondary | ICD-10-CM | POA: Diagnosis not present

## 2020-02-15 DIAGNOSIS — Z609 Problem related to social environment, unspecified: Secondary | ICD-10-CM | POA: Diagnosis not present

## 2020-02-15 DIAGNOSIS — I10 Essential (primary) hypertension: Secondary | ICD-10-CM | POA: Diagnosis not present

## 2020-02-15 DIAGNOSIS — K5792 Diverticulitis of intestine, part unspecified, without perforation or abscess without bleeding: Secondary | ICD-10-CM | POA: Diagnosis not present

## 2020-02-15 DIAGNOSIS — E876 Hypokalemia: Secondary | ICD-10-CM | POA: Diagnosis not present

## 2020-02-15 DIAGNOSIS — M810 Age-related osteoporosis without current pathological fracture: Secondary | ICD-10-CM | POA: Diagnosis not present

## 2020-02-15 DIAGNOSIS — R109 Unspecified abdominal pain: Secondary | ICD-10-CM | POA: Diagnosis not present

## 2020-02-15 DIAGNOSIS — M4316 Spondylolisthesis, lumbar region: Secondary | ICD-10-CM | POA: Diagnosis not present

## 2020-02-15 DIAGNOSIS — K5909 Other constipation: Secondary | ICD-10-CM | POA: Diagnosis not present

## 2020-02-15 DIAGNOSIS — N184 Chronic kidney disease, stage 4 (severe): Secondary | ICD-10-CM | POA: Diagnosis not present

## 2020-02-15 DIAGNOSIS — M1990 Unspecified osteoarthritis, unspecified site: Secondary | ICD-10-CM | POA: Diagnosis not present

## 2020-02-18 DIAGNOSIS — E876 Hypokalemia: Secondary | ICD-10-CM | POA: Diagnosis not present

## 2020-02-18 DIAGNOSIS — K5792 Diverticulitis of intestine, part unspecified, without perforation or abscess without bleeding: Secondary | ICD-10-CM | POA: Diagnosis not present

## 2020-02-18 DIAGNOSIS — M109 Gout, unspecified: Secondary | ICD-10-CM | POA: Diagnosis not present

## 2020-02-18 DIAGNOSIS — M1990 Unspecified osteoarthritis, unspecified site: Secondary | ICD-10-CM | POA: Diagnosis not present

## 2020-02-18 DIAGNOSIS — I10 Essential (primary) hypertension: Secondary | ICD-10-CM | POA: Diagnosis not present

## 2020-02-18 DIAGNOSIS — M1711 Unilateral primary osteoarthritis, right knee: Secondary | ICD-10-CM | POA: Diagnosis not present

## 2020-02-18 DIAGNOSIS — M545 Low back pain, unspecified: Secondary | ICD-10-CM | POA: Diagnosis not present

## 2020-02-18 DIAGNOSIS — Z609 Problem related to social environment, unspecified: Secondary | ICD-10-CM | POA: Diagnosis not present

## 2020-02-18 DIAGNOSIS — R1310 Dysphagia, unspecified: Secondary | ICD-10-CM | POA: Diagnosis not present

## 2020-02-18 DIAGNOSIS — M810 Age-related osteoporosis without current pathological fracture: Secondary | ICD-10-CM | POA: Diagnosis not present

## 2020-02-18 DIAGNOSIS — N184 Chronic kidney disease, stage 4 (severe): Secondary | ICD-10-CM | POA: Diagnosis not present

## 2020-02-18 DIAGNOSIS — G8929 Other chronic pain: Secondary | ICD-10-CM | POA: Diagnosis not present

## 2020-02-18 DIAGNOSIS — M4316 Spondylolisthesis, lumbar region: Secondary | ICD-10-CM | POA: Diagnosis not present

## 2020-02-18 DIAGNOSIS — G4733 Obstructive sleep apnea (adult) (pediatric): Secondary | ICD-10-CM | POA: Diagnosis not present

## 2020-02-18 DIAGNOSIS — R109 Unspecified abdominal pain: Secondary | ICD-10-CM | POA: Diagnosis not present

## 2020-02-18 DIAGNOSIS — K5909 Other constipation: Secondary | ICD-10-CM | POA: Diagnosis not present

## 2020-02-25 ENCOUNTER — Telehealth: Payer: Self-pay

## 2020-02-25 NOTE — Progress Notes (Signed)
  Called patient regarding appointment on 02-27-20 at 11 am.  Patient stated he would like to cancel appointment.  Georgiana Shore ,[facility] Pharmacist Assistant [phone]

## 2020-02-27 ENCOUNTER — Telehealth: Payer: Medicare (Managed Care)

## 2020-02-27 NOTE — Progress Notes (Unsigned)
Chronic Care Management [name]     MRN: [id number]     DOB: [birth year]  Chief Complaint/ HPI [name], 79 y.o., male, presents for their initial CCM visit with the clinical pharmacist via telephone due to COVID-19 pandemic.  PCP : [name], MD No diagnosis found.  Office Visits:  ***  Consult Visit: *** Patient Active Problem List   Diagnosis Date Noted  . Acute diverticulitis 12/06/2019  . Hypokalemia 12/06/2019  . Bowel incontinence 11/12/2019  . Dysphagia 07/19/2019  . Anemia due to stage 4 chronic kidney disease ([place]) 03/24/2019  . Wound of left lower extremity 03/24/2019  . Weakness 03/24/2019  . Paroxysmal tachycardia ([place]) 12/05/2018  . Spondylolisthesis of lumbar region 03/10/2018  . Diastolic dysfunction 60/12/9321  . Dyspnea 11/11/2017  . Leukocytosis 09/04/2017  . History of fracture of left hip 09/01/2017  . Senile purpura ([place]) 06/22/2017  . Osteoarthritis of spine 03/21/2017  . Chronic low back pain with degenerative lumbar spinal stenosis-minimal improvement with surgery late 2019 03/08/2017  . Hypersomnolence 08/20/2016  . Gout 07/07/2016  . Other constipation 01/13/2016  . Primary osteoarthritis of right knee 12/08/2015  . Fatty liver 10/09/2014  . Gallstones 10/09/2014  . Bilateral lower extremity edema 09/25/2014  . Chronic venous insufficiency 08/29/2013  . Right shoulder pain 08/29/2013  . Proximal humerus fracture 05/22/2013  . Neuropathy 01/20/2011  . OSA (obstructive sleep apnea) 10/30/2008  . Osteoporosis 09/25/2008  . CKD (chronic kidney disease), stage IV ([place]) 03/01/2008  . Essential hypertension 04/11/2007  . Mild depressed bipolar I disorder ([place]) 09/21/2006   Past Surgical History:  Procedure Laterality Date  . BACK SURGERY    . CARDIAC CATHETERIZATION    . COLONOSCOPY W/ POLYPECTOMY  03-10-15   per Dr. Havery Moros, adenomatous polyps, repeat in 3 yrs   . HIP FRACTURE SURGERY Left 09/02/2017   . REVERSE SHOULDER ARTHROPLASTY Right 05/22/2013   Procedure: REVERSE SHOULDER ARTHROPLASTY;  Surgeon: Nita Sells, MD;  Location: [facility];  Service: Orthopedics;  Laterality: Right;  Right reverse total shoulder  . TONSILLECTOMY    . TOTAL KNEE ARTHROPLASTY Right 12/08/2015   Procedure: TOTAL KNEE ARTHROPLASTY;  Surgeon: Dorna Leitz, MD;  Location: [facility];  Service: Orthopedics;  Laterality: Right;   Family History  Problem Relation Age of Onset  . Cancer Mother        unknown type  . Stroke Father   . Cancer Maternal Grandmother   . Appendicitis Paternal Grandfather   . Prostate cancer Brother   . Colon cancer Neg Hx   . Esophageal cancer Neg Hx   . Rectal cancer Neg Hx   . Stomach cancer Neg Hx   . Colon polyps Neg Hx    Allergies  Allergen Reactions  . Sulfa Antibiotics Nausea Only   Outpatient Encounter Medications as of 02/27/2020  Medication Sig  . acetaminophen (TYLENOL 8 HOUR) 650 MG CR tablet Take 1,300 mg by mouth 2 (two) times daily.   . Calcium-Vitamin D (CALTRATE 600 PLUS-VIT D PO) Take 1 tablet by mouth daily.   . L-methylfolate Calcium 15 MG TABS Take 15 mg by mouth daily.  Marland Kitchen saccharomyces boulardii (FLORASTOR) 250 MG capsule Take 1 capsule (250 mg total) by mouth 2 (two) times daily.  Marland Kitchen torsemide (DEMADEX) 20 MG tablet Take 1 tablet (20 mg total) by mouth daily.  Marland Kitchen tranylcypromine (PARNATE) 10 MG tablet Take 20 mg by mouth 2 (two) times daily.    No facility-administered encounter medications on  file as of 02/27/2020.   Patient Care Team    Relationship Specialty Notifications Start End  [name], MD PCP - General Family Medicine  02/27/20   Sueanne Margarita, MD PCP - Cardiology Cardiology Admissions 02/21/18   Phylliss Bob, MD Consulting Physician Orthopedic Surgery  01/30/19   Sueanne Margarita, MD Consulting Physician Cardiology  01/30/19   Calvert Cantor, MD Consulting Physician Ophthalmology  01/30/19   Elmarie Shiley, MD Consulting Physician  Nephrology  01/30/19   Madelin Rear, [facility] Pharmacist Pharmacist  11/21/19    Comment: [phone]   Current Diagnosis/Assessment: Goals Addressed   None    Hypertension   BP goal {CHL HP UPSTREAM Pharmacist BP ranges:[phone]}  BP Readings from Last 3 Encounters:  12/12/19 140/86  11/12/19 122/68  10/03/19 118/70    BMP Latest Ref Rng & Units 12/12/2019 12/10/2019 12/08/2019  Glucose 70 - 99 mg/dL 91 94 94  BUN 8 - 23 mg/dL 50(H) 46(H) 53(H)  Creatinine 0.61 - 1.24 mg/dL 2.46(H) 2.13(H) 2.35(H)  BUN/Creat Ratio 10 - 24 - - -  Sodium 135 - 145 mmol/L 138 135 140  Potassium 3.5 - 5.1 mmol/L 3.7 3.9 4.2  Chloride 98 - 111 mmol/L 101 98 100  CO2 22 - 32 mmol/L '27 27 30  ' Calcium 8.9 - 10.3 mg/dL 8.7(L) 8.2(L) 8.6(L)   Previous medications: ***. Patient checks BP at home {CHL HP BP Monitoring [phone]}. Recent home readings: ***. Patient is currently ***at goal on the following medications:  . ***  ***Diet and exercise discussed - ***  Plan  ***Continue current medications.  Diabetes   A1c goal < ***%  Lab Results  Component Value Date/Time   HGBA1C 5.7 04/03/2019 02:11 PM   HGBA1C 5.5 05/01/2018 12:01 PM   GFR 23.07 (L) 10/03/2019 11:51 AM   GFR 24.56 (L) 07/09/2019 11:09 AM   GFR 25.15 (L) 04/03/2019 02:11 PM    Checking BG: {CHL HP Blood Glucose Monitoring [phone]}. Recent FBG readings: ***  Previous medications: ***. Patient is currently ***at goal on the following medications:  . ***  ***We discussed: diet and exercise extensively. ***  Plan  ***Continue current medications.  Hyperlipidemia   LDL goal < ***  Lipid Panel     Component Value Date/Time   CHOL 140 03/14/2019 1151   TRIG 71.0 03/14/2019 1151   TRIG 58 03/23/2006 1139   HDL 57.10 03/14/2019 1151   LDLCALC 68 03/14/2019 1151    Hepatic Function Latest Ref Rng & Units 12/12/2019 12/07/2019 12/06/2019  Total Protein 6.5 - 8.1 g/dL 6.0(L) 5.9(L) 6.1(L)  Albumin  3.5 - 5.0 g/dL 2.7(L) 2.9(L) 3.4(L)  AST 15 - 41 U/L '19 18 20  ' ALT 0 - 44 U/L '22 17 17  ' Alk Phosphatase 38 - 126 U/L 53 66 71  Total Bilirubin 0.3 - 1.2 mg/dL 0.5 0.9 1.1  Bilirubin, Direct 0.0 - 0.3 mg/dL - - -    The ASCVD Risk score ([place]., et al., 2013) failed to calculate for the following reasons:   The 2013 ASCVD risk score is only valid for ages 32 to 58  Previous medications: *** Patient is currently***at goal the following medications:  . ***  We discussed diet and exercise extensively. ***  Plan  ***Continue current medications.  AFIB   Pulse Readings from Last 3 Encounters:  12/12/19 78  11/12/19 96  10/03/19 97   ***Patient is currently rate controlled on the following medications:  . ***  Prevention of  stroke in Afib. CHA2DS2/VAS Stroke Risk Points      N/A >= 2 Points: High Risk  1 - 1.99 Points: Medium Risk  0 Points: Low Risk    Last Change: N/A      This score determines the patient's risk of having a stroke if the  patient has atrial fibrillation.      This score is not applicable to this patient. Components are not  calculated.   .***Denies any abnormal bruising, bleeding from nose or gums or blood in urine or stool. Patient is currently ***controlled on the following medications:  . ***  Plan  ***Continue current medications.  Heart Failure   Type: {type of heart failure:[phone]}. Last ejection fraction: ***.  Previous medications: ***. ***Patient is currently controlled on the following medications:  . ***  ***We discussed weighing daily; if you gain more than 3 pounds in one day or 5 pounds in one week call doctor.  Plan  Continue current medications.  ***COPD ***Tobacco Cessation     Last spirometry: ***.Gold Grade: {CHL HP Upstream Pharm COPD Gold TXMIW:[id number]} Current COPD Classification:  {CHL HP Upstream Pharm COPD Classification:[phone]} Lab Results  Component Value Date/Time   EOSPCT 1 12/07/2019 04:49 AM    Lab Results  Component Value Date/Time   EOSABS 0.2 12/07/2019 04:49 AM   EOSABS 0.6 (H) 06/19/2019 04:28 PM    Previous medications:  Using maintenance inhaler regularly? {yes/no:20286}. Frequency of rescue inhaler use:  {CHL HP Upstream Pharm Inhaler NOIB:[id number]} Patient is currently on the following medications:  . ***  Plan  ***Continue current medications.  Nicotine Abuse     Social History   Tobacco Use  Smoking Status Never Smoker  Smokeless Tobacco Never Used   Patient smokes {Time to first cigarette:23873} Patient triggers include: {Smoking Triggers:23882} On a scale of 1-10, reports MOTIVATION to quit is *** On a scale of 1-10, reports CONFIDENCE in quitting is *** Previous quit attempts included: *** Patient is currently on the following medications:  . ***  We discussed: {Smoking Cessation Counseling:23883}  Plan  ***Continue current medications.  Hypothyroidism   Lab Results  Component Value Date/Time   TSH 2.26 04/03/2019 02:11 PM   TSH 3.36 07/11/2018 11:50 AM   FREET4 0.86 06/19/2010 12:04 PM   FREET4 0.8 01/06/2007 10:57 AM   TSH is currently within range on the following medications:  . ***  We discussed:  {CHL HP Upstream Pharmacy discussion:[phone]}.  Plan  ***Continue current medications.  GERD   Lab Results  Component Value Date   IHWTUUEK80 034 03/26/2019   ***Patient denies recent acid reflux.  Currently controlled on: . ***  We discussed: Avoidance of potential triggers such as alcohol, fatty foods, lying down after eating, and tomato sauce.  Plan   ***Continue current medications.  Osteo***   Last DEXA Scan: ***.  Vit D, 25-Hydroxy  Date Value Ref Range Status  01/23/2018 83  Final    Previous medications: ***. Patient {is;is not an osteoporosis candidate:23886}.  Current medications: . ***  We discussed: {Osteoporosis Counseling:23892}.  Plan  ***Continue current medications.  Depression    PHQ9 SCORE ONLY 11/12/2019 05/15/2019 03/14/2019  PHQ-9 Total Score 1 0 0   Previous medications: ***. ***Denies side effects, reports ongoing benefit of taking medication. ***Patient is currently controlled on the following medications:  . ***  Plan  ***Continue current medications.  Vaccines   Immunization History  Administered Date(s) Administered  . Fluad Quad(high Dose 65+) 12/05/2018  . Influenza Split  01/20/2011, 01/13/2012  . Influenza Whole 12/18/2007, 12/18/2008, 02/09/2010  . Influenza, High Dose Seasonal PF 01/19/2013, 01/10/2015, 01/13/2016, 03/08/2017, 12/13/2017  . Influenza,inj,Quad PF,6+ Mos 11/27/2013  . PFIZER SARS-COV-2 Vaccination 05/22/2019, 06/25/2019  . Pneumococcal Conjugate-13 10/09/2014  . Pneumococcal Polysaccharide-23 03/23/2006  . Td 11/11/2004, 06/08/2013  . Zoster 10/22/2010    Reviewed and discussed patient's vaccination history.    Plan  Recommended patient receive ***.   Medication Management / Care Coordination   Receives prescription medications from:  [facility], [address] [place] [address] Phone: [phone] Fax: [phone]  [id number], [address]. [place] [address] Phone: [phone] Fax: [place], [place] [address] [place] [address] Phone: [phone] Fax: [phone]  [facility], [address], [address] [place], [place] [address] Phone: [phone] Fax: [phone]  [id number] [place]Phone: [phone]: [phone]   ***  Plan  {US Pharmacy  PPOEU:23536} ___________________________ SDOH (Social Determinants of Health) assessments performed: Yes.  Future Appointments  Date Time Provider DEwing 02/27/2020 11:00 AM [facility] CCM PHARMACIST [facility]  04/11/2020  2:40 PM HYong ChannelSBrayton Mars MD [facility]   Visit follow-up:  . CPA follow-up: ***. .Marland KitchenRPH follow-up: *** month *** visit.  JMadelin Rear Pharm.D., BCGP Clinical Pharmacist [facility] [phone]

## 2020-03-12 ENCOUNTER — Other Ambulatory Visit: Payer: Self-pay | Admitting: Family Medicine

## 2020-04-11 ENCOUNTER — Encounter: Payer: Medicare Other | Admitting: Family Medicine

## 2020-12-10 IMAGING — DX DG LUMBAR SPINE COMPLETE 4+V
5 series · 5 of 5 positions shown · non-contrast
Comparison: Intraoperative fluoroscopic images dated 03/10/2018

CLINICAL DATA: Bilateral leg pain, lumbar surgery on [REDACTED]

EXAM:
LUMBAR SPINE - COMPLETE 4+ VIEW

[t lumbar spine ap]
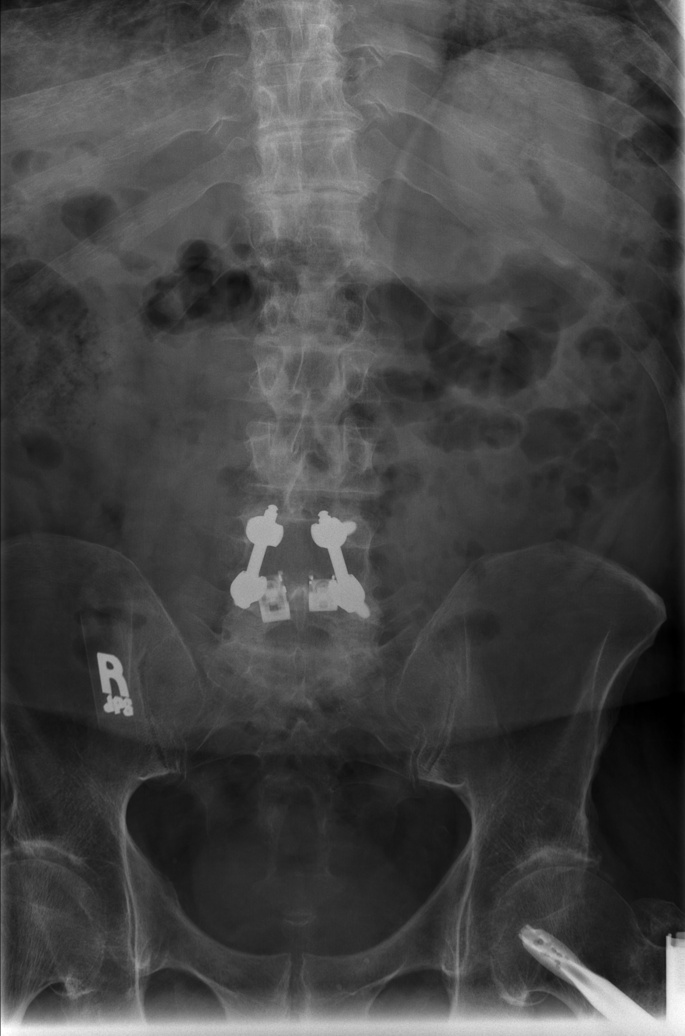

[t lumbar spine obl (1 of 2)]
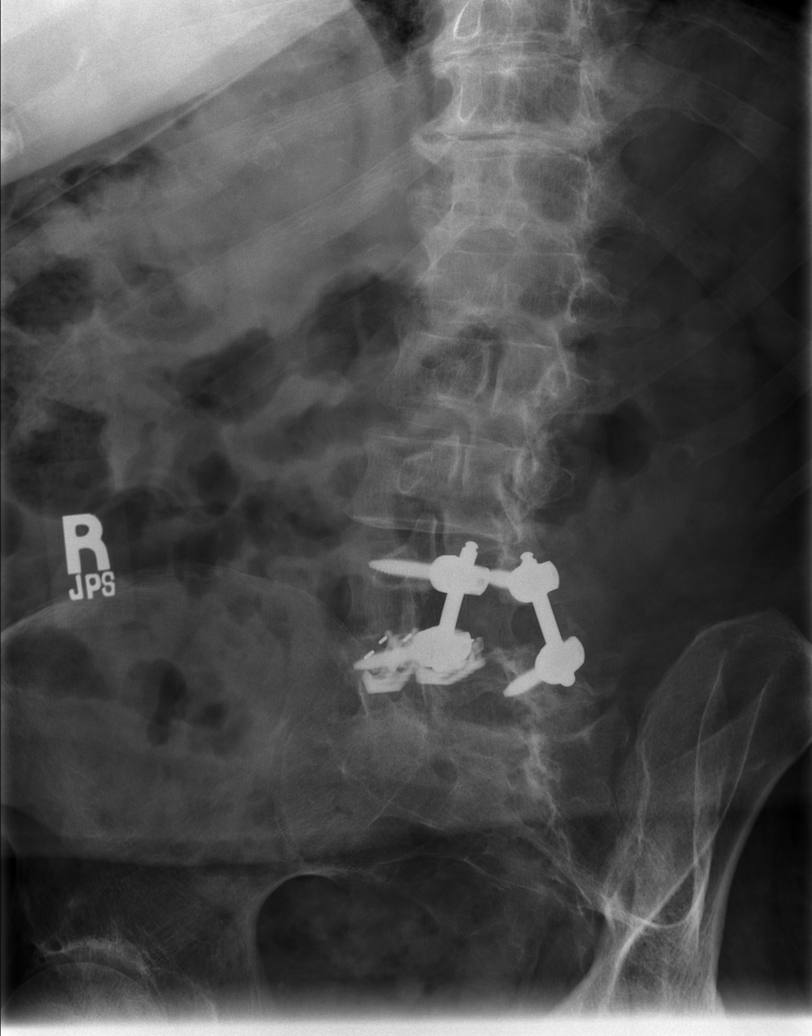

[t lumbar spine obl (2 of 2)]
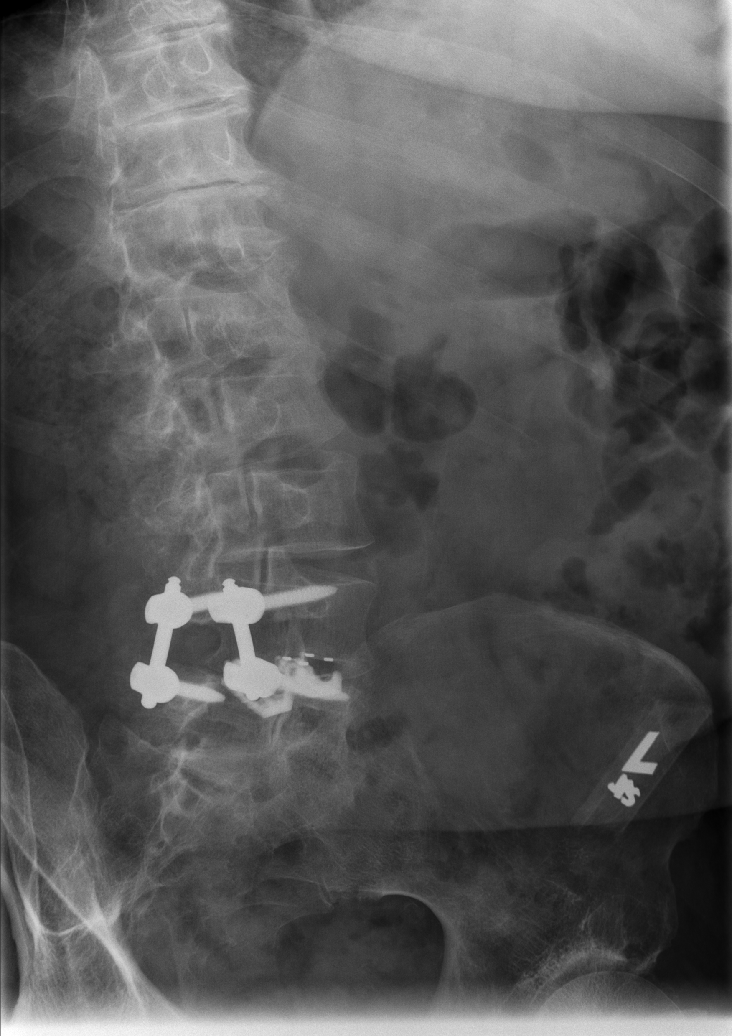

[t lumbar spine lat]
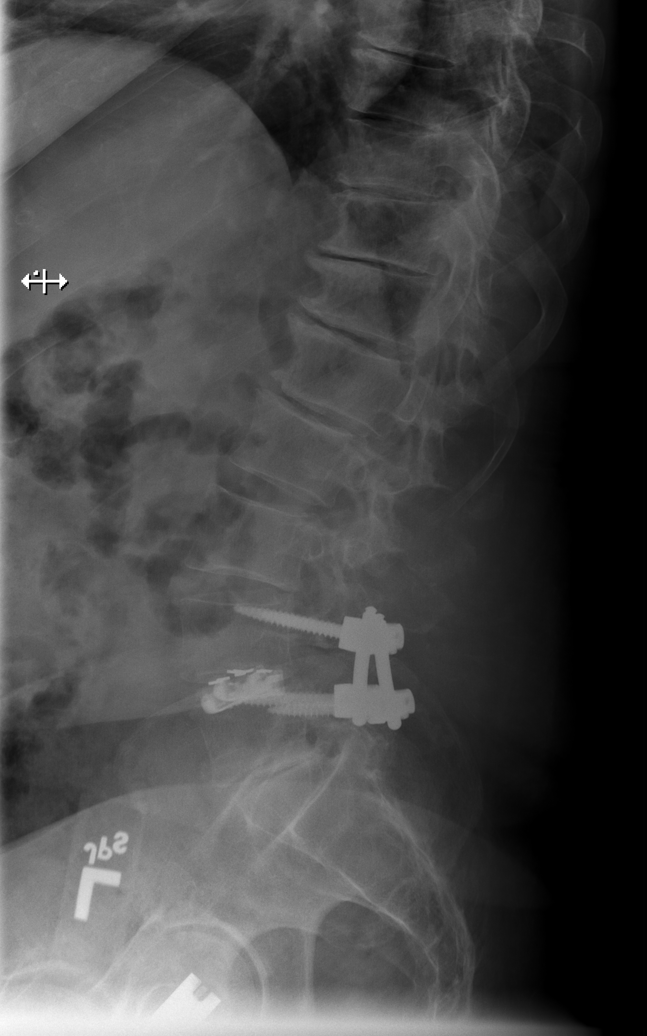

[t lumbar l-5 s-1 spot]
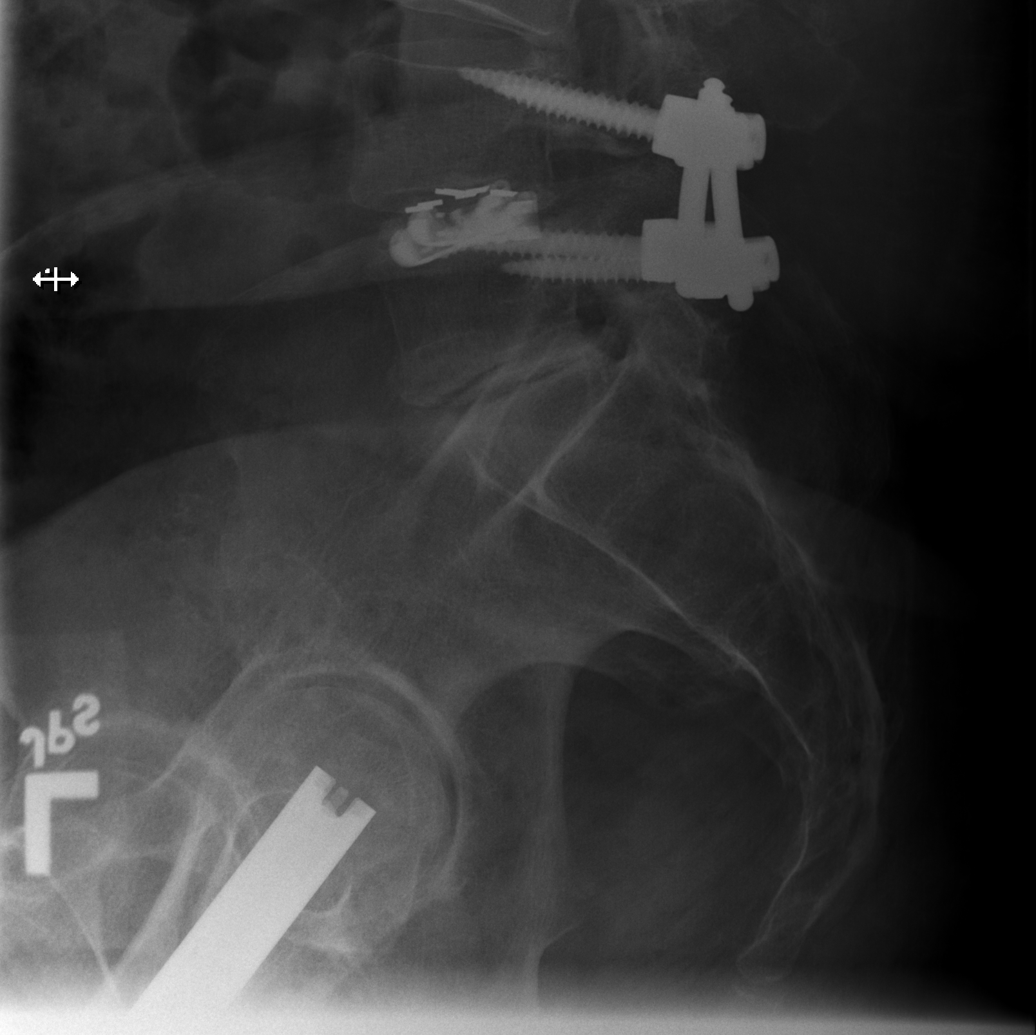

[5 of 5 positions shown; findings below may reference images not displayed]

FINDINGS: Status post PLIF at L4-5 with interbody spacer. Pedicle screws are
in satisfactory position.

Degenerative changes at L5-S1.

Status post ORIF of the left hip, incompletely visualized.

No evidence of fracture or dislocation. Vertebral body heights are
maintained. Visualized bony pelvis appears intact
IMPRESSION: Status post PLIF at L4-5, without evidence of complication.
